# Patient Record
Sex: Female | Born: 1985
Health system: Southern US, Community
[De-identification: ages and names within clinical notes are randomized; demographics above are authoritative.]

## PROBLEM LIST (undated history)

## (undated) ENCOUNTER — Inpatient Hospital Stay (HOSPITAL_COMMUNITY): Payer: Self-pay

## (undated) DIAGNOSIS — M5030 Other cervical disc degeneration, unspecified cervical region: Secondary | ICD-10-CM

## (undated) DIAGNOSIS — K8020 Calculus of gallbladder without cholecystitis without obstruction: Secondary | ICD-10-CM

## (undated) DIAGNOSIS — Z332 Encounter for elective termination of pregnancy: Secondary | ICD-10-CM

## (undated) DIAGNOSIS — D649 Anemia, unspecified: Secondary | ICD-10-CM

## (undated) DIAGNOSIS — Z862 Personal history of diseases of the blood and blood-forming organs and certain disorders involving the immune mechanism: Secondary | ICD-10-CM

## (undated) DIAGNOSIS — F4310 Post-traumatic stress disorder, unspecified: Secondary | ICD-10-CM

## (undated) DIAGNOSIS — C801 Malignant (primary) neoplasm, unspecified: Secondary | ICD-10-CM

## (undated) DIAGNOSIS — F419 Anxiety disorder, unspecified: Secondary | ICD-10-CM

## (undated) DIAGNOSIS — R609 Edema, unspecified: Secondary | ICD-10-CM

## (undated) DIAGNOSIS — F319 Bipolar disorder, unspecified: Secondary | ICD-10-CM

## (undated) DIAGNOSIS — E7800 Pure hypercholesterolemia, unspecified: Secondary | ICD-10-CM

## (undated) DIAGNOSIS — N739 Female pelvic inflammatory disease, unspecified: Secondary | ICD-10-CM

## (undated) DIAGNOSIS — M1990 Unspecified osteoarthritis, unspecified site: Secondary | ICD-10-CM

## (undated) DIAGNOSIS — K219 Gastro-esophageal reflux disease without esophagitis: Secondary | ICD-10-CM

## (undated) DIAGNOSIS — D72829 Elevated white blood cell count, unspecified: Secondary | ICD-10-CM

## (undated) DIAGNOSIS — M419 Scoliosis, unspecified: Secondary | ICD-10-CM

## (undated) DIAGNOSIS — K5900 Constipation, unspecified: Secondary | ICD-10-CM

## (undated) DIAGNOSIS — E669 Obesity, unspecified: Secondary | ICD-10-CM

## (undated) DIAGNOSIS — A630 Anogenital (venereal) warts: Secondary | ICD-10-CM

## (undated) DIAGNOSIS — M5430 Sciatica, unspecified side: Secondary | ICD-10-CM

## (undated) DIAGNOSIS — N189 Chronic kidney disease, unspecified: Secondary | ICD-10-CM

## (undated) DIAGNOSIS — C539 Malignant neoplasm of cervix uteri, unspecified: Secondary | ICD-10-CM

## (undated) DIAGNOSIS — Z87448 Personal history of other diseases of urinary system: Secondary | ICD-10-CM

## (undated) HISTORY — DX: Anxiety disorder, unspecified: F41.9

## (undated) HISTORY — DX: Unspecified osteoarthritis, unspecified site: M19.90

## (undated) HISTORY — PX: ABDOMINAL EXPLORATION SURGERY: SHX538

## (undated) HISTORY — PX: BACK SURGERY: SHX140

## (undated) HISTORY — DX: Scoliosis, unspecified: M41.9

## (undated) HISTORY — DX: Calculus of gallbladder without cholecystitis without obstruction: K80.20

## (undated) HISTORY — DX: Obesity, unspecified: E66.9

---
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
## 2007-10-20 ENCOUNTER — Emergency Department (HOSPITAL_COMMUNITY): Admission: EM | Admit: 2007-10-20 | Discharge: 2007-10-21 | Payer: Self-pay | Admitting: Emergency Medicine

## 2008-09-05 ENCOUNTER — Emergency Department (HOSPITAL_COMMUNITY): Admission: EM | Admit: 2008-09-05 | Discharge: 2008-09-05 | Payer: Self-pay | Admitting: Emergency Medicine

## 2009-05-06 ENCOUNTER — Emergency Department (HOSPITAL_COMMUNITY): Admission: EM | Admit: 2009-05-06 | Discharge: 2009-05-07 | Payer: Self-pay | Admitting: Emergency Medicine

## 2009-05-21 ENCOUNTER — Emergency Department (HOSPITAL_COMMUNITY): Admission: EM | Admit: 2009-05-21 | Discharge: 2009-05-22 | Payer: Self-pay | Admitting: Emergency Medicine

## 2009-10-24 ENCOUNTER — Emergency Department (HOSPITAL_COMMUNITY)
Admission: EM | Admit: 2009-10-24 | Discharge: 2009-10-24 | Payer: Self-pay | Source: Home / Self Care | Admitting: Emergency Medicine

## 2009-11-05 ENCOUNTER — Emergency Department (HOSPITAL_COMMUNITY)
Admission: EM | Admit: 2009-11-05 | Discharge: 2009-11-06 | Payer: Self-pay | Source: Home / Self Care | Admitting: Emergency Medicine

## 2009-11-16 ENCOUNTER — Emergency Department (HOSPITAL_COMMUNITY)
Admission: EM | Admit: 2009-11-16 | Discharge: 2009-11-17 | Payer: Self-pay | Source: Home / Self Care | Admitting: Emergency Medicine

## 2010-02-19 ENCOUNTER — Emergency Department (HOSPITAL_COMMUNITY): Admission: EM | Admit: 2010-02-19 | Discharge: 2010-02-19 | Payer: Self-pay | Admitting: Emergency Medicine

## 2010-04-08 ENCOUNTER — Emergency Department (HOSPITAL_COMMUNITY)
Admission: EM | Admit: 2010-04-08 | Discharge: 2010-04-08 | Payer: Self-pay | Source: Home / Self Care | Admitting: Emergency Medicine

## 2010-06-06 LAB — WET PREP, GENITAL: Trich, Wet Prep: NONE SEEN

## 2010-06-08 LAB — GC/CHLAMYDIA PROBE AMP, GENITAL: GC Probe Amp, Genital: NEGATIVE

## 2010-06-08 LAB — WET PREP, GENITAL: WBC, Wet Prep HPF POC: NONE SEEN

## 2010-06-08 LAB — URINE MICROSCOPIC-ADD ON

## 2010-06-08 LAB — URINALYSIS, ROUTINE W REFLEX MICROSCOPIC
Glucose, UA: NEGATIVE mg/dL
Nitrite: POSITIVE — AB
Specific Gravity, Urine: 1.036 — ABNORMAL HIGH (ref 1.005–1.030)

## 2010-06-08 LAB — URINE CULTURE
Colony Count: >=100000
Culture  Setup Time: 201108250857

## 2010-06-08 LAB — POCT PREGNANCY, URINE: Preg Test, Ur: NEGATIVE

## 2010-06-09 LAB — POCT PREGNANCY, URINE: Preg Test, Ur: NEGATIVE

## 2010-06-09 LAB — WET PREP, GENITAL
Trich, Wet Prep: NONE SEEN
Yeast Wet Prep HPF POC: NONE SEEN

## 2010-06-09 LAB — HEMOCCULT GUIAC POC 1CARD (OFFICE): Fecal Occult Bld: POSITIVE

## 2010-07-03 ENCOUNTER — Emergency Department (HOSPITAL_COMMUNITY): Payer: Medicaid Other

## 2010-07-03 ENCOUNTER — Emergency Department (HOSPITAL_COMMUNITY)
Admission: EM | Admit: 2010-07-03 | Discharge: 2010-07-03 | Disposition: A | Discharge status: Home / Self Care | Payer: Medicaid Other | Attending: Emergency Medicine | Admitting: Emergency Medicine

## 2010-07-03 DIAGNOSIS — S91309A Unspecified open wound, unspecified foot, initial encounter: Secondary | ICD-10-CM | POA: Insufficient documentation

## 2010-07-03 DIAGNOSIS — W269XXA Contact with unspecified sharp object(s), initial encounter: Secondary | ICD-10-CM | POA: Insufficient documentation

## 2010-07-03 DIAGNOSIS — Z23 Encounter for immunization: Secondary | ICD-10-CM | POA: Insufficient documentation

## 2010-07-03 LAB — DIFFERENTIAL
Basophils Relative: 0 % (ref 0–1)
Eosinophils Absolute: 0.2 10*3/uL (ref 0.0–0.7)
Eosinophils Relative: 2 % (ref 0–5)
Lymphs Abs: 2.7 10*3/uL (ref 0.7–4.0)
Monocytes Absolute: 0.5 10*3/uL (ref 0.1–1.0)
Monocytes Relative: 7 % (ref 3–12)
Neutrophils Relative %: 59 % (ref 43–77)

## 2010-07-03 LAB — CBC
HCT: 37.3 % (ref 36.0–46.0)
MCHC: 33.3 g/dL (ref 30.0–36.0)
MCV: 84.5 fL (ref 78.0–100.0)
RBC: 4.42 MIL/uL (ref 3.87–5.11)

## 2010-07-03 LAB — URINALYSIS, ROUTINE W REFLEX MICROSCOPIC
Hgb urine dipstick: NEGATIVE
Protein, ur: NEGATIVE mg/dL

## 2010-07-03 LAB — POCT PREGNANCY, URINE: Preg Test, Ur: NEGATIVE

## 2010-07-03 LAB — BASIC METABOLIC PANEL
CO2: 26 mEq/L (ref 19–32)
Chloride: 107 mEq/L (ref 96–112)
Creatinine, Ser: 0.79 mg/dL (ref 0.4–1.2)
GFR calc Af Amer: >60 mL/min (ref >60)
Potassium: 4.3 mEq/L (ref 3.5–5.1)

## 2010-07-07 ENCOUNTER — Emergency Department (HOSPITAL_COMMUNITY)
Admission: EM | Admit: 2010-07-07 | Discharge: 2010-07-07 | Disposition: A | Discharge status: Home / Self Care | Payer: Medicaid Other | Attending: Emergency Medicine | Admitting: Emergency Medicine

## 2010-07-07 DIAGNOSIS — R109 Unspecified abdominal pain: Secondary | ICD-10-CM | POA: Insufficient documentation

## 2010-07-07 DIAGNOSIS — N898 Other specified noninflammatory disorders of vagina: Secondary | ICD-10-CM | POA: Insufficient documentation

## 2010-07-07 DIAGNOSIS — K089 Disorder of teeth and supporting structures, unspecified: Secondary | ICD-10-CM | POA: Insufficient documentation

## 2010-07-07 DIAGNOSIS — A64 Unspecified sexually transmitted disease: Secondary | ICD-10-CM | POA: Insufficient documentation

## 2010-07-07 LAB — WET PREP, GENITAL
WBC, Wet Prep HPF POC: NONE SEEN
Yeast Wet Prep HPF POC: NONE SEEN

## 2010-07-07 LAB — URINALYSIS, ROUTINE W REFLEX MICROSCOPIC
Bilirubin Urine: NEGATIVE
Glucose, UA: NEGATIVE mg/dL
Hgb urine dipstick: NEGATIVE
Ketones, ur: NEGATIVE mg/dL
Protein, ur: NEGATIVE mg/dL
pH: 6 (ref 5.0–8.0)

## 2010-07-07 LAB — URINE MICROSCOPIC-ADD ON

## 2010-07-08 LAB — URINE CULTURE: Colony Count: 50000

## 2010-08-11 NOTE — Discharge Summary (Signed)
NAMELINDE, WILENSKY                          ACCOUNT NO.:  [id number]   MEDICAL RECORD NO.:  [id number]                   PATIENT TYPE:  INP   LOCATION:  0102                                 FACILITY:  [facility]   PHYSICIAN:  Cindie Crumbly, M.D.               DATE OF BIRTH:  November 09, 1985   DATE OF ADMISSION:  04/17/2002  DATE OF DISCHARGE:                                 DISCHARGE SUMMARY   REASON FOR ADMISSION:  This 25 year old white female was admitted for  suicidal ideation with a plan to kill herself via drug overdose.  For  further history of present illness, please see the patient's psychiatry  admission assessment.   PHYSICAL EXAMINATION:  At the time of admission was significant for obesity.  She also had a history of gastroesophageal reflux disease for which she was  taking Nexium and a history of asthma for which she used an albuterol  inhaler on a p.r.n. basis.  She also had a history of anemia in the distant  past.   LABORATORY EXAMINATION:  The patient underwent a laboratory workup to rule  out any  other medical problems contributing to her symptomatology.  A TSH  was 0.270, free T4 was 1.32.  GGT was within normal limits.  A UA showed  trace leukocyte esterase.  She continued to complain of further symptoms and  a second UA is pending at the time of discharge.  She was treated  prophylactically with Septra DS 1 p.o. b.i.d. for a total of 7 days and  reports that her symptoms of dysuria are now resolving.  A urine probe for  gonorrhea and chlamydia were indeterminate and probably secondary to the  patient's urinary tract infection.  An RPR was nonreactive.  Further  laboratory work-up was performed at [facility] prior  to the patient being transferred to this facility.  The patient received no  x-rays, no special procedures, no additional consultations.  She sustained  no complications during the course of this hospitalization.   HOSPITAL  COURSE:  On admission, the patient was psychomotor agitated,  histrionic.  Affect and mood were depressed, irritable and angry.  She  admitted to suicidal ideation.  She showed poor impulse control and was  begun on a trial of Lexapro.  She tolerated this medication well without  side effects and at the time of discharge she denies any homicidal or  suicidal ideation.  Her affect and mood have improved.  Her concentration is  increased.  She is actively participating in all aspects of the therapeutic  treatment program and consequently is felt to have reached her maximum  benefits of hospitalization and is ready for discharge to a less restricted  alternative setting.  She no longer appears to be a danger to herself or  others.   CONDITION ON DISCHARGE:  Improved.   DISCHARGE DIAGNOSES:   AXIS I:  1.  Major depression, single episode, severe, without psychosis.  2. Rule out substance-induced mood disorder.  3. Polysubstance abuse.  4. Rule out polysubstance dependence.   AXIS II:  1. Histrionic traits.  2. Rule out personality disorder not otherwise specified.    AXIS III:  1. Gastroesophageal reflux disease.  2. History of asthma.  3. History of anemia.  4. Cystitis, resolving.   AXIS IV:  Current psychosocial stressors are severe.   AXIS V:  Code 20 on admission, code 30 on discharge.   FURTHER EVALUATION AND TREATMENT RECOMMENDATIONS:  1. The patient is discharged to home.  2. She is discharged on an unrestricted level of activity and a regular     diet.  3. She will follow up with her primary care physician for all further     aspects of her medical care and with [facility]     for all further aspects of her psychiatric care and consequently I will     sign off on the case at this time.   DISCHARGE MEDICATIONS:  1. Lexapro 10 mg p.o. daily.  2. Septra DS 1 p.o. b.i.d. for a total of 7 days.  3. Nexium and albuterol inhaler she will  continue to take, as prescribed by     her primary care physician.                                                 Cindie Crumbly, M.D.    TS/MEDQ  D:  04/24/2002  T:  04/24/2002  Job:  [id number]
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
## 2010-09-03 ENCOUNTER — Emergency Department (HOSPITAL_COMMUNITY)
Admission: EM | Admit: 2010-09-03 | Discharge: 2010-09-03 | Disposition: A | Discharge status: Home / Self Care | Payer: Medicaid Other | Attending: Emergency Medicine | Admitting: Emergency Medicine

## 2010-09-03 DIAGNOSIS — K219 Gastro-esophageal reflux disease without esophagitis: Secondary | ICD-10-CM | POA: Insufficient documentation

## 2010-09-03 DIAGNOSIS — F329 Major depressive disorder, single episode, unspecified: Secondary | ICD-10-CM | POA: Insufficient documentation

## 2010-09-03 DIAGNOSIS — R609 Edema, unspecified: Secondary | ICD-10-CM | POA: Insufficient documentation

## 2010-09-03 DIAGNOSIS — M545 Low back pain, unspecified: Secondary | ICD-10-CM | POA: Insufficient documentation

## 2010-09-03 DIAGNOSIS — M5430 Sciatica, unspecified side: Secondary | ICD-10-CM | POA: Insufficient documentation

## 2010-09-03 DIAGNOSIS — J45909 Unspecified asthma, uncomplicated: Secondary | ICD-10-CM | POA: Insufficient documentation

## 2010-09-03 DIAGNOSIS — G8929 Other chronic pain: Secondary | ICD-10-CM | POA: Insufficient documentation

## 2010-09-03 DIAGNOSIS — M79609 Pain in unspecified limb: Secondary | ICD-10-CM | POA: Insufficient documentation

## 2010-09-03 DIAGNOSIS — R339 Retention of urine, unspecified: Secondary | ICD-10-CM | POA: Insufficient documentation

## 2010-09-03 DIAGNOSIS — Z79899 Other long term (current) drug therapy: Secondary | ICD-10-CM | POA: Insufficient documentation

## 2010-09-03 DIAGNOSIS — F3289 Other specified depressive episodes: Secondary | ICD-10-CM | POA: Insufficient documentation

## 2010-09-04 ENCOUNTER — Encounter: Payer: Self-pay | Admitting: Family Medicine

## 2010-10-22 ENCOUNTER — Emergency Department (HOSPITAL_COMMUNITY)
Admission: EM | Admit: 2010-10-22 | Discharge: 2010-10-22 | Disposition: A | Discharge status: Home / Self Care | Payer: Medicaid Other | Attending: Emergency Medicine | Admitting: Emergency Medicine

## 2010-10-22 DIAGNOSIS — H9209 Otalgia, unspecified ear: Secondary | ICD-10-CM | POA: Insufficient documentation

## 2010-10-22 DIAGNOSIS — K219 Gastro-esophageal reflux disease without esophagitis: Secondary | ICD-10-CM | POA: Insufficient documentation

## 2010-10-22 DIAGNOSIS — R51 Headache: Secondary | ICD-10-CM | POA: Insufficient documentation

## 2010-10-22 DIAGNOSIS — J45909 Unspecified asthma, uncomplicated: Secondary | ICD-10-CM | POA: Insufficient documentation

## 2010-10-22 DIAGNOSIS — H60399 Other infective otitis externa, unspecified ear: Secondary | ICD-10-CM | POA: Insufficient documentation

## 2010-10-22 DIAGNOSIS — N3010 Interstitial cystitis (chronic) without hematuria: Secondary | ICD-10-CM | POA: Insufficient documentation

## 2010-12-22 LAB — DIFFERENTIAL
Basophils Relative: 0
Eosinophils Absolute: 0.2
Lymphs Abs: 3.2
Monocytes Relative: 5
Neutro Abs: 13 — ABNORMAL HIGH
Neutrophils Relative %: 75

## 2010-12-22 LAB — CBC
MCHC: 33
MCV: 79.8
Platelets: 287
RBC: 3.98
WBC: 17.4 — ABNORMAL HIGH

## 2010-12-22 LAB — POCT I-STAT, CHEM 8
Creatinine, Ser: 0.8
Glucose, Bld: 84
HCT: 33 — ABNORMAL LOW
Hemoglobin: 11.2 — ABNORMAL LOW
TCO2: 19

## 2010-12-22 LAB — BUN: BUN: 8

## 2010-12-22 LAB — CREATININE, SERUM
Creatinine, Ser: 0.71
GFR calc Af Amer: >60

## 2011-03-09 ENCOUNTER — Emergency Department (HOSPITAL_COMMUNITY)
Admission: EM | Admit: 2011-03-09 | Discharge: 2011-03-10 | Disposition: A | Discharge status: Home / Self Care | Payer: Medicaid Other | Attending: Emergency Medicine | Admitting: Emergency Medicine

## 2011-03-09 ENCOUNTER — Encounter: Payer: Self-pay | Admitting: *Deleted

## 2011-03-09 ENCOUNTER — Emergency Department (HOSPITAL_COMMUNITY): Payer: Medicaid Other

## 2011-03-09 DIAGNOSIS — R1011 Right upper quadrant pain: Secondary | ICD-10-CM | POA: Insufficient documentation

## 2011-03-09 DIAGNOSIS — R3915 Urgency of urination: Secondary | ICD-10-CM | POA: Insufficient documentation

## 2011-03-09 DIAGNOSIS — M549 Dorsalgia, unspecified: Secondary | ICD-10-CM | POA: Insufficient documentation

## 2011-03-09 DIAGNOSIS — R110 Nausea: Secondary | ICD-10-CM | POA: Insufficient documentation

## 2011-03-09 DIAGNOSIS — R12 Heartburn: Secondary | ICD-10-CM | POA: Insufficient documentation

## 2011-03-09 DIAGNOSIS — K5900 Constipation, unspecified: Secondary | ICD-10-CM | POA: Insufficient documentation

## 2011-03-09 DIAGNOSIS — K8020 Calculus of gallbladder without cholecystitis without obstruction: Secondary | ICD-10-CM

## 2011-03-09 DIAGNOSIS — R300 Dysuria: Secondary | ICD-10-CM | POA: Insufficient documentation

## 2011-03-09 DIAGNOSIS — R319 Hematuria, unspecified: Secondary | ICD-10-CM | POA: Insufficient documentation

## 2011-03-09 LAB — CBC
MCH: 28.3 pg (ref 26.0–34.0)
MCHC: 33.5 g/dL (ref 30.0–36.0)
MCV: 84.3 fL (ref 78.0–100.0)
Platelets: 246 10*3/uL (ref 150–400)
RBC: 3.82 MIL/uL — ABNORMAL LOW (ref 3.87–5.11)
RDW: 15.3 % (ref 11.5–15.5)

## 2011-03-09 LAB — POCT PREGNANCY, URINE: Preg Test, Ur: NEGATIVE

## 2011-03-09 LAB — DIFFERENTIAL
Basophils Relative: 0 % (ref 0–1)
Eosinophils Absolute: 0.3 10*3/uL (ref 0.0–0.7)
Eosinophils Relative: 3 % (ref 0–5)
Lymphs Abs: 4.8 10*3/uL — ABNORMAL HIGH (ref 0.7–4.0)

## 2011-03-09 LAB — URINE MICROSCOPIC-ADD ON

## 2011-03-09 LAB — URINALYSIS, ROUTINE W REFLEX MICROSCOPIC
Bilirubin Urine: NEGATIVE
Hgb urine dipstick: NEGATIVE
Ketones, ur: NEGATIVE mg/dL
Nitrite: NEGATIVE
Protein, ur: NEGATIVE mg/dL
Urobilinogen, UA: 0.2 mg/dL (ref 0.0–1.0)

## 2011-03-09 LAB — COMPREHENSIVE METABOLIC PANEL
ALT: 20 U/L (ref 0–35)
Albumin: 3.3 g/dL — ABNORMAL LOW (ref 3.5–5.2)
Calcium: 8.5 mg/dL (ref 8.4–10.5)
GFR calc Af Amer: >90 mL/min (ref >90)
Glucose, Bld: 82 mg/dL (ref 70–99)
Sodium: 138 mEq/L (ref 135–145)
Total Protein: 6.6 g/dL (ref 6.0–8.3)

## 2011-03-09 LAB — LIPASE, BLOOD: Lipase: 42 U/L (ref 11–59)

## 2011-03-09 MED ORDER — ONDANSETRON HCL 4 MG PO TABS: 4.0000 mg | ORAL_TABLET | Freq: Four times a day (QID) | ORAL | Status: AC

## 2011-03-09 MED ORDER — ONDANSETRON HCL 4 MG/2ML IJ SOLN
4.0000 mg | Freq: Once | INTRAMUSCULAR | Status: AC
  Administered 2011-03-09: 4 mg via INTRAVENOUS
  Filled 2011-03-09: qty 2

## 2011-03-09 MED ORDER — SODIUM CHLORIDE 0.9 % IV SOLN
INTRAVENOUS | Status: DC
  Administered 2011-03-09: 1000 mL via INTRAVENOUS

## 2011-03-09 MED ORDER — HYDROCODONE-ACETAMINOPHEN 5-325 MG PO TABS: 2.0000 | ORAL_TABLET | ORAL | Status: AC | PRN

## 2011-03-09 MED ORDER — DICYCLOMINE HCL 20 MG PO TABS
10.0000 mg | ORAL_TABLET | Freq: Once | ORAL | Status: AC
  Administered 2011-03-09: 10 mg via ORAL
  Filled 2011-03-09: qty 1

## 2011-03-09 MED ORDER — HYDROMORPHONE HCL PF 1 MG/ML IJ SOLN
1.0000 mg | Freq: Once | INTRAMUSCULAR | Status: AC
  Administered 2011-03-09: 1 mg via INTRAVENOUS
  Filled 2011-03-09: qty 1

## 2011-03-09 NOTE — ED Provider Notes (Signed)
History     CSN: [id number] Arrival date & time: 03/09/2011  7:17 PM   First MD Initiated Contact with Patient 03/09/11 2001      Chief Complaint  Patient presents with  . Abdominal Pain    RUQ    (Consider location/radiation/quality/duration/timing/severity/associated sxs/prior treatment) Patient is a 25 y.o. female presenting with abdominal pain. The history is provided by the patient. No language interpreter was used.  Abdominal Pain The primary symptoms of the illness include abdominal pain, nausea and dysuria. The primary symptoms of the illness do not include fever, fatigue, shortness of breath, vomiting, diarrhea, vaginal discharge or vaginal bleeding. The current episode started 2 days ago. The problem has been gradually worsening.  The dysuria is associated with hematuria and urgency. The dysuria is not associated with frequency.  Additional symptoms associated with the illness include heartburn, constipation, urgency, hematuria and back pain. Symptoms associated with the illness do not include chills, anorexia, diaphoresis or frequency. Significant associated medical issues include PUD, GERD, gallstones and liver disease. Significant associated medical issues do not include inflammatory bowel disease, diverticulitis, HIV or cardiac disease.  RUQ pain with nausea and vomiting x 24 hours.  Multiple complaints including dysuria, severe back pain across the middle due to her newly diagnosed scoliosis.  She was started on flagyl 2 days ago [facility].    Epigastric pain from her chronic GERD.  Missed her last dose of nexium.  Increased flatus and burping.  Crying in pain.  States she has been to a pain clinic in the past.  States she has no money and had to borrow money to feed her kids.  Due for her next depo shot on Dec 27.  Has not had a period since her last child was born 19 months ago.    Past Medical History  Diagnosis Date  . Cholecystolithiasis      Past Surgical History  Procedure Date  . Appendectomy     History reviewed. No pertinent family history.  History  Substance Use Topics  . Smoking status: Not on file  . Smokeless tobacco: Not on file  . Alcohol Use: No    OB History    Grav Para Term Preterm Abortions TAB SAB Ect Mult Living                  Review of Systems  Constitutional: Negative for fever, chills, diaphoresis and fatigue.  Respiratory: Negative for shortness of breath.   Gastrointestinal: Positive for heartburn, nausea, abdominal pain and constipation. Negative for vomiting, diarrhea and anorexia.  Genitourinary: Positive for dysuria, urgency and hematuria. Negative for frequency, vaginal bleeding and vaginal discharge.  Musculoskeletal: Positive for back pain.  All other systems reviewed and are negative.    Allergies  Review of patient's allergies indicates no known allergies.  Home Medications   Current Outpatient Rx  Name Route Sig Dispense Refill  . ESOMEPRAZOLE MAGNESIUM 40 MG PO CPDR Oral Take 80 mg by mouth daily before breakfast.      . METRONIDAZOLE 500 MG PO TABS Oral Take 500 mg by mouth 2 (two) times daily.      Marland Kitchen HYDROCODONE-ACETAMINOPHEN 5-325 MG PO TABS Oral Take 2 tablets by mouth every 4 (four) hours as needed for pain. 15 tablet 0  . ONDANSETRON HCL 4 MG PO TABS Oral Take 1 tablet (4 mg total) by mouth every 6 (six) hours. 12 tablet 0  . ONDANSETRON HCL 4 MG PO TABS Oral Take  1 tablet (4 mg total) by mouth every 6 (six) hours. 12 tablet 0    BP 123/85  Pulse 92  Temp(Src) 98.3 F (36.8 C) (Oral)  Resp 18  SpO2 100%  Physical Exam  Nursing note and vitals reviewed. Constitutional: She is oriented to person, place, and time. She appears well-developed and well-nourished.  Eyes: Pupils are equal, round, and reactive to light.  Neck: Normal range of motion.  Cardiovascular: Normal rate and normal heart sounds.  Exam reveals no gallop.   No murmur  heard. Pulmonary/Chest: Effort normal.  Abdominal: Soft. She exhibits no distension and no mass. There is tenderness. There is no rebound and no guarding.  Musculoskeletal: Normal range of motion.  Neurological: She is alert and oriented to person, place, and time.  Skin: Skin is warm and dry. No rash noted. No erythema.  Psychiatric: She has a normal mood and affect.    ED Course  Procedures (including critical care time)  Labs Reviewed  URINALYSIS, ROUTINE W REFLEX MICROSCOPIC - Abnormal; Notable for the following:    Leukocytes, UA TRACE (*)    All other components within normal limits  CBC - Abnormal; Notable for the following:    WBC 11.2 (*)    RBC 3.82 (*)    Hemoglobin 10.8 (*)    HCT 32.2 (*)    All other components within normal limits  DIFFERENTIAL - Abnormal; Notable for the following:    Lymphs Abs 4.8 (*)    All other components within normal limits  COMPREHENSIVE METABOLIC PANEL - Abnormal; Notable for the following:    Albumin 3.3 (*)    Total Bilirubin 0.1 (*)    All other components within normal limits  LIPASE, BLOOD  POCT PREGNANCY, URINE  URINE MICROSCOPIC-ADD ON  LAB REPORT - SCANNED   US Abdomen Complete  03/09/2011  *RADIOLOGY REPORT*  Clinical Data:  Were right upper quadrant pain, mid epigastric and back pain.  History of gallstones.  COMPLETE ABDOMINAL ULTRASOUND  Comparison:  10/20/2007 CT  Findings:  Gallbladder:  Cholelithiasis.  Wall echo shadow sign.  No wall thickening or pericholecystic fluid.  Negative sonographic Murphy's sign.  Common bile duct:  Dilated up to 8 mm.  The distal duct is obscured.  There is a questionable sludge or debris within the distal duct however no shadowing stones identified.  Liver:  No focal lesion identified.  Within normal limits in parenchymal echogenicity.  IVC:  Appears normal.  Pancreas:  Pancreatic duct measures up to 2 mm.  No focal abnormality identified.  Spleen:  Normal sonographic appearance measuring 10 cm.   Right Kidney:  Normal sonographic appearance measuring 11.1 cm.  No hydronephrosis.  Left Kidney:  Normal sonographic appearance, measuring 11.3 cm. Hydronephrosis.  Abdominal aorta:  Normal sonographic appearance without aneurysmal dilatation, measuring 2.1 cm.  IMPRESSION: Cholelithiasis without sonographic evidence for cholecystitis.  There is dilatation of the CBD up to 8 mm, which allowing for differences in technique is larger than the 2009 CT. While no shadowing stones identified, there is questionable sludge/debris within the distal duct. LFT correlation and consideration for ERCP recommended.  Original Report Authenticated By: Waneta Martins, M.D.     1. Abdominal pain   2. Cholecystolithiasis       MDM  Multiple complaints including RUQ pain, chronic back pain, BV with trmt 2 days ago on flagyl, indegestion and flatus.  Very emotional crying in pain.  U/s shows cholelithiasis  Without cholecystiis.  No liver enzyme elevation.  Better after pain med.  Wants to go home and follow up with [facility].  Abn labs as follows: Labs Reviewed  URINALYSIS, ROUTINE W REFLEX MICROSCOPIC - Abnormal; Notable for the following:    Leukocytes, UA TRACE (*)    All other components within normal limits  CBC - Abnormal; Notable for the following:    WBC 11.2 (*)    RBC 3.82 (*)    Hemoglobin 10.8 (*)    HCT 32.2 (*)    All other components within normal limits  DIFFERENTIAL - Abnormal; Notable for the following:    Lymphs Abs 4.8 (*)    All other components within normal limits  COMPREHENSIVE METABOLIC PANEL - Abnormal; Notable for the following:    Albumin 3.3 (*)    Total Bilirubin 0.1 (*)    All other components within normal limits  LIPASE, BLOOD  POCT PREGNANCY, URINE  URINE MICROSCOPIC-ADD ON  LAB REPORT - SCANNED          Jethro Bastos, NP 03/10/11 1705

## 2011-03-09 NOTE — ED Notes (Signed)
Pt complaining of what feels like a "muscle spasm" located centrally in her back along with a burning sensation in her stomach.

## 2011-03-09 NOTE — ED Notes (Signed)
Per EMS:  Pt comes from home where she began experiencing RUQ abdominal pain this evening.  Pt denies V/D and is nauseated.  Pt confirms hx of gall stones that have not been treated.  Of note, pt was treated yesterday for an infection involving her cervix (unclear which infection).

## 2011-03-11 NOTE — ED Provider Notes (Signed)
Medical screening examination/treatment/procedure(s) were performed by non-physician practitioner and as supervising physician I was immediately available for consultation/collaboration.  Olivia Mackie, MD 03/11/11 [phone]

## 2011-04-05 ENCOUNTER — Encounter (HOSPITAL_COMMUNITY): Payer: Self-pay | Admitting: *Deleted

## 2011-04-05 ENCOUNTER — Emergency Department (HOSPITAL_COMMUNITY)
Admission: EM | Admit: 2011-04-05 | Discharge: 2011-04-06 | Disposition: A | Discharge status: Home / Self Care | Payer: Medicaid Other | Attending: Emergency Medicine | Admitting: Emergency Medicine

## 2011-04-05 ENCOUNTER — Encounter: Payer: Self-pay | Admitting: Internal Medicine

## 2011-04-05 DIAGNOSIS — G8929 Other chronic pain: Secondary | ICD-10-CM

## 2011-04-05 DIAGNOSIS — M545 Low back pain, unspecified: Secondary | ICD-10-CM | POA: Insufficient documentation

## 2011-04-05 NOTE — ED Notes (Signed)
The pt has had lower back pain for the past 2 days.  She goes to a pain clinic but lost her certification.

## 2011-04-05 NOTE — ED Provider Notes (Signed)
History     CSN: [id number]  Arrival date & time 04/05/11  2204   First MD Initiated Contact with Patient 04/05/11 2349      Chief Complaint  Patient presents with  . Back Pain    (Consider location/radiation/quality/duration/timing/severity/associated sxs/prior treatment) HPI The pt has had lower back pain for the past 2 days. She goes to a pain clinic but lost her certification.  Past Medical History  Diagnosis Date  . Cholecystolithiasis     Past Surgical History  Procedure Date  . Appendectomy     History reviewed. No pertinent family history.  History  Substance Use Topics  . Smoking status: Not on file  . Smokeless tobacco: Not on file  . Alcohol Use: No    OB History    Grav Para Term Preterm Abortions TAB SAB Ect Mult Living                  Review of Systems  Allergies  Review of patient's allergies indicates no known allergies.  Home Medications   Current Outpatient Rx  Name Route Sig Dispense Refill  . GOODYS BODY PAIN PO Oral Take 1 packet by mouth 2 (two) times daily as needed. For pain    . FLUCONAZOLE 150 MG PO TABS Oral Take 150 mg by mouth once.    . OMEPRAZOLE 20 MG PO CPDR Oral Take 20 mg by mouth daily.    . CYCLOBENZAPRINE HCL 10 MG PO TABS Oral Take 1 tablet (10 mg total) by mouth 2 (two) times daily as needed for muscle spasms. 20 tablet 0  . TRAMADOL HCL 50 MG PO TABS Oral Take 1 tablet (50 mg total) by mouth every 6 (six) hours as needed for pain. 15 tablet 0    BP 110/73  Pulse 100  Temp(Src) 98.6 F (37 C) (Oral)  Resp 20  SpO2 100%  Physical Exam  Nursing note and vitals reviewed. Constitutional: She is oriented to person, place, and time. She appears well-developed and well-nourished. No distress.  HENT:  Head: Normocephalic and atraumatic.  Eyes: Pupils are equal, round, and reactive to light.  Neck: Normal range of motion.  Cardiovascular: Normal rate and intact distal pulses.   Pulmonary/Chest: No respiratory  distress.  Abdominal: Normal appearance. She exhibits no distension.  Musculoskeletal: Normal range of motion.       Thoracic back: She exhibits pain.       Lumbar back: She exhibits tenderness and spasm.  Neurological: She is alert and oriented to person, place, and time. No cranial nerve deficit. GCS eye subscore is 4. GCS verbal subscore is 5. GCS motor subscore is 6.  Skin: Skin is warm and dry. No rash noted.  Psychiatric: She has a normal mood and affect. Her behavior is normal.    ED Course  Procedures (including critical care time)  Labs Reviewed - No data to display No results found.   1. Chronic back pain              Nelia Shi, MD 04/06/11 0020

## 2011-04-06 ENCOUNTER — Encounter: Payer: Self-pay | Admitting: Internal Medicine

## 2011-04-06 MED ORDER — TRAMADOL HCL 50 MG PO TABS: 50.0000 mg | ORAL_TABLET | Freq: Four times a day (QID) | ORAL | Status: AC | PRN

## 2011-04-06 MED ORDER — OXYCODONE-ACETAMINOPHEN 5-325 MG PO TABS
ORAL_TABLET | ORAL | Status: AC
  Filled 2011-04-06: qty 2

## 2011-04-06 MED ORDER — OXYCODONE-ACETAMINOPHEN 5-325 MG PO TABS
2.0000 | ORAL_TABLET | Freq: Once | ORAL | Status: AC
  Administered 2011-04-06: 2 via ORAL

## 2011-04-06 MED ORDER — CYCLOBENZAPRINE HCL 10 MG PO TABS: 10.0000 mg | ORAL_TABLET | Freq: Two times a day (BID) | ORAL | Status: AC | PRN

## 2011-04-06 MED ORDER — HYDROMORPHONE HCL PF 2 MG/ML IJ SOLN
2.0000 mg | Freq: Once | INTRAMUSCULAR | Status: AC
  Administered 2011-04-06: 2 mg via INTRAMUSCULAR
  Filled 2011-04-06: qty 1

## 2011-04-06 NOTE — ED Notes (Signed)
Discharge inst and prescriptions given   Voiced understanding. 

## 2011-04-06 NOTE — ED Notes (Signed)
Patient continues to c/o pain to her back 10/10  Will notify MD

## 2011-04-07 ENCOUNTER — Emergency Department (HOSPITAL_COMMUNITY)
Admission: EM | Admit: 2011-04-07 | Discharge: 2011-04-08 | Disposition: A | Discharge status: Home / Self Care | Payer: Medicaid Other | Attending: Emergency Medicine | Admitting: Emergency Medicine

## 2011-04-07 ENCOUNTER — Encounter (HOSPITAL_COMMUNITY): Payer: Self-pay | Admitting: *Deleted

## 2011-04-07 DIAGNOSIS — R109 Unspecified abdominal pain: Secondary | ICD-10-CM | POA: Insufficient documentation

## 2011-04-07 DIAGNOSIS — R10816 Epigastric abdominal tenderness: Secondary | ICD-10-CM | POA: Insufficient documentation

## 2011-04-07 DIAGNOSIS — K649 Unspecified hemorrhoids: Secondary | ICD-10-CM

## 2011-04-07 DIAGNOSIS — K8020 Calculus of gallbladder without cholecystitis without obstruction: Secondary | ICD-10-CM | POA: Insufficient documentation

## 2011-04-07 DIAGNOSIS — K644 Residual hemorrhoidal skin tags: Secondary | ICD-10-CM | POA: Insufficient documentation

## 2011-04-07 LAB — PREGNANCY, URINE: Preg Test, Ur: NEGATIVE

## 2011-04-07 LAB — URINALYSIS, ROUTINE W REFLEX MICROSCOPIC
Glucose, UA: NEGATIVE mg/dL
Hgb urine dipstick: NEGATIVE
Protein, ur: NEGATIVE mg/dL
pH: 6 (ref 5.0–8.0)

## 2011-04-07 LAB — CBC
Platelets: 232 10*3/uL (ref 150–400)
RDW: 15 % (ref 11.5–15.5)
WBC: 10.5 10*3/uL (ref 4.0–10.5)

## 2011-04-07 LAB — COMPREHENSIVE METABOLIC PANEL
AST: 20 U/L (ref 0–37)
Albumin: 3.8 g/dL (ref 3.5–5.2)
Alkaline Phosphatase: 66 U/L (ref 39–117)
Chloride: 104 mEq/L (ref 96–112)
Creatinine, Ser: 0.86 mg/dL (ref 0.50–1.10)
Potassium: 3.5 mEq/L (ref 3.5–5.1)
Total Bilirubin: 0.2 mg/dL — ABNORMAL LOW (ref 0.3–1.2)
Total Protein: 7.8 g/dL (ref 6.0–8.3)

## 2011-04-07 MED ORDER — HYDROMORPHONE HCL PF 1 MG/ML IJ SOLN
1.0000 mg | Freq: Once | INTRAMUSCULAR | Status: AC
Start: 2011-04-07 — End: 2011-04-07
  Administered 2011-04-07: 1 mg via INTRAVENOUS
  Filled 2011-04-07: qty 1

## 2011-04-07 MED ORDER — SODIUM CHLORIDE 0.9 % IV BOLUS (SEPSIS)
1000.0000 mL | Freq: Once | INTRAVENOUS | Status: AC
  Administered 2011-04-07: 1000 mL via INTRAVENOUS

## 2011-04-07 MED ORDER — ONDANSETRON HCL 4 MG/2ML IJ SOLN
4.0000 mg | Freq: Once | INTRAMUSCULAR | Status: AC
  Administered 2011-04-07: 4 mg via INTRAVENOUS
  Filled 2011-04-07: qty 2

## 2011-04-07 MED ORDER — GI COCKTAIL ~~LOC~~
30.0000 mL | Freq: Once | ORAL | Status: AC
  Administered 2011-04-07: 30 mL via ORAL
  Filled 2011-04-07: qty 30

## 2011-04-07 NOTE — ED Notes (Signed)
Pt presents tonight for pain in her RUQ that is described as same as what she experienced x 2 weeks ago that was attributed to cholelithiasis.  Pt was instructed to follow up with her MD which referred her to a GI specialist.  The appointment with said GI specialist is not until the 22nd of this month and the pt, appearing tearful and restless, states that she simply cannot wait until then for relief from said cholelithiasis.  Pt also states that upon palpation of her rectum earlier when she was constipated, she noted blood upon her hand.

## 2011-04-08 DIAGNOSIS — G43909 Migraine, unspecified, not intractable, without status migrainosus: Secondary | ICD-10-CM | POA: Insufficient documentation

## 2011-04-08 DIAGNOSIS — K8020 Calculus of gallbladder without cholecystitis without obstruction: Secondary | ICD-10-CM | POA: Insufficient documentation

## 2011-04-08 DIAGNOSIS — J45909 Unspecified asthma, uncomplicated: Secondary | ICD-10-CM | POA: Insufficient documentation

## 2011-04-08 MED ORDER — HYDROCODONE-ACETAMINOPHEN 5-325 MG PO TABS: 1.0000 | ORAL_TABLET | ORAL | Status: AC | PRN

## 2011-04-08 MED ORDER — HYDROMORPHONE HCL PF 1 MG/ML IJ SOLN
1.0000 mg | Freq: Once | INTRAMUSCULAR | Status: AC
  Administered 2011-04-08: 1 mg via INTRAVENOUS
  Filled 2011-04-08: qty 1

## 2011-04-08 MED ORDER — ONDANSETRON HCL 4 MG PO TABS: 4.0000 mg | ORAL_TABLET | Freq: Four times a day (QID) | ORAL | Status: AC | PRN

## 2011-04-08 NOTE — ED Provider Notes (Addendum)
12:05 AM Pt with known gallstones, who presents tonight with severe epigastric pain that goes to her back.  Exam shows epigastric tenderness, no mass or rigidity.   Results for orders placed during the hospital encounter of 04/07/11  CBC      Component Value Range   WBC 10.5  4.0 - 10.5 (K/uL)   RBC 4.22  3.87 - 5.11 (MIL/uL)   Hemoglobin 11.9 (*) 12.0 - 15.0 (g/dL)   HCT 16.1 (*) 09.6 - 46.0 (%)   MCV 83.9  78.0 - 100.0 (fL)   MCH 28.2  26.0 - 34.0 (pg)   MCHC 33.6  30.0 - 36.0 (g/dL)   RDW 04.5  40.9 - 81.1 (%)   Platelets 232  150 - 400 (K/uL)  COMPREHENSIVE METABOLIC PANEL      Component Value Range   Sodium 138  135 - 145 (mEq/L)   Potassium 3.5  3.5 - 5.1 (mEq/L)   Chloride 104  96 - 112 (mEq/L)   CO2 22  19 - 32 (mEq/L)   Glucose, Bld 80  70 - 99 (mg/dL)   BUN 14  6 - 23 (mg/dL)   Creatinine, Ser 9.14  0.50 - 1.10 (mg/dL)   Calcium 9.3  8.4 - 78.2 (mg/dL)   Total Protein 7.8  6.0 - 8.3 (g/dL)   Albumin 3.8  3.5 - 5.2 (g/dL)   AST 20  0 - 37 (U/L)   ALT 22  0 - 35 (U/L)   Alkaline Phosphatase 66  39 - 117 (U/L)   Total Bilirubin 0.2 (*) 0.3 - 1.2 (mg/dL)   GFR calc non Af Amer >90  >90 (mL/min)   GFR calc Af Amer >90  >90 (mL/min)  URINALYSIS, ROUTINE W REFLEX MICROSCOPIC      Component Value Range   Color, Urine YELLOW  YELLOW    APPearance CLEAR  CLEAR    Specific Gravity, Urine 1.030  1.005 - 1.030    pH 6.0  5.0 - 8.0    Glucose, UA NEGATIVE  NEGATIVE (mg/dL)   Hgb urine dipstick NEGATIVE  NEGATIVE    Bilirubin Urine NEGATIVE  NEGATIVE    Ketones, ur NEGATIVE  NEGATIVE (mg/dL)   Protein, ur NEGATIVE  NEGATIVE (mg/dL)   Urobilinogen, UA 0.2  0.0 - 1.0 (mg/dL)   Nitrite NEGATIVE  NEGATIVE    Leukocytes, UA NEGATIVE  NEGATIVE   PREGNANCY, URINE      Component Value Range   Preg Test, Ur NEGATIVE    LIPASE, BLOOD      Component Value Range   Lipase 23  11 - 59 (U/L)   US Abdomen Complete  03/09/2011  *RADIOLOGY REPORT*  Clinical Data:  Were right upper  quadrant pain, mid epigastric and back pain.  History of gallstones.  COMPLETE ABDOMINAL ULTRASOUND  Comparison:  10/20/2007 CT  Findings:  Gallbladder:  Cholelithiasis.  Wall echo shadow sign.  No wall thickening or pericholecystic fluid.  Negative sonographic Murphy's sign.  Common bile duct:  Dilated up to 8 mm.  The distal duct is obscured.  There is a questionable sludge or debris within the distal duct however no shadowing stones identified.  Liver:  No focal lesion identified.  Within normal limits in parenchymal echogenicity.  IVC:  Appears normal.  Pancreas:  Pancreatic duct measures up to 2 mm.  No focal abnormality identified.  Spleen:  Normal sonographic appearance measuring 10 cm.  Right Kidney:  Normal sonographic appearance measuring 11.1 cm.  No hydronephrosis.  Left  Kidney:  Normal sonographic appearance, measuring 11.3 cm. Hydronephrosis.  Abdominal aorta:  Normal sonographic appearance without aneurysmal dilatation, measuring 2.1 cm.  IMPRESSION: Cholelithiasis without sonographic evidence for cholecystitis.  There is dilatation of the CBD up to 8 mm, which allowing for differences in technique is larger than the 2009 CT. While no shadowing stones identified, there is questionable sludge/debris within the distal duct. LFT correlation and consideration for ERCP recommended.  Original Report Authenticated By: Waneta Martins, M.D.   She did not get relief with either Dilaudid or GI cocktail.  Recommend admission for pain control, completion of her workup.    Medical screening examination/treatment/procedure(s) were conducted as a shared visit with non-physician practitioner(s) and myself.  I personally evaluated the patient during the encounter   Carleene Cooper III, MD 04/08/11 0011  Carleene Cooper III, MD 04/08/11 [phone]

## 2011-04-08 NOTE — ED Provider Notes (Signed)
History     CSN: [id number]  Arrival date & time 04/07/11  2009   First MD Initiated Contact with Patient 04/07/11 2120      Chief Complaint  Patient presents with  . Cholelithiasis    (Consider location/radiation/quality/duration/timing/severity/associated sxs/prior treatment) Patient is a 26 y.o. female presenting with abdominal pain. The history is provided by the patient.  Abdominal Pain The primary symptoms of the illness include abdominal pain. The primary symptoms of the illness do not include fever, shortness of breath or hematemesis.  Pt with known hx cholelithiasis with dilated CBD on [place]. Ate a hot dog at approx 3am and began to have epigastric pain with 1 episode vomiting around 330 PM today. Pain with some radiation to RUQ and back. Pain burning, severe, waxing and waning. Denies nausea at time of examination. No prior tx today. Pt reports she has an appt with GI in the next few weeks but is hurting too badly to wait.   Also reports rectal pain after attempting to pass hard stool assoc with some BRB on outside of stool. Denies lightheadedness, weakness, or hx anemia.  Past Medical History  Diagnosis Date  . Cholecystolithiasis   . Obesity   . Migraine   . Anxiety   . Gallstones   . Asthma     Past Surgical History  Procedure Date  . Appendectomy     History reviewed. No pertinent family history.  History  Substance Use Topics  . Smoking status: Not on file  . Smokeless tobacco: Not on file  . Alcohol Use: No     Review of Systems  Constitutional: Negative for fever.  Respiratory: Negative for shortness of breath.   Gastrointestinal: Positive for abdominal pain. Negative for hematemesis.  10 systems reviewed and are negative for acute change except as noted in the HPI.   Allergies  Review of patient's allergies indicates no known allergies.  Home Medications   Current Outpatient Rx  Name Route Sig Dispense Refill  . GOODYS BODY PAIN PO Oral Take  1 packet by mouth 2 (two) times daily as needed. For pain    . CYCLOBENZAPRINE HCL 10 MG PO TABS Oral Take 1 tablet (10 mg total) by mouth 2 (two) times daily as needed for muscle spasms. 20 tablet 0  . MEDROXYPROGESTERONE ACETATE 150 MG/ML IM SUSP Intramuscular Inject 150 mg into the muscle every 3 (three) months.    . OMEPRAZOLE 20 MG PO CPDR Oral Take 20 mg by mouth daily.    . TRAMADOL HCL 50 MG PO TABS Oral Take 1 tablet (50 mg total) by mouth every 6 (six) hours as needed for pain. 15 tablet 0  . LEVONORGESTREL 1.5 MG PO TABS Oral Take 1 tablet by mouth as needed. Emergency contraceptive      BP 138/81  Pulse 92  Temp(Src) 98.5 F (36.9 C) (Oral)  Resp 24  Wt 210 lb 1 oz (95.284 kg)  SpO2 100%  Physical Exam  Nursing note and vitals reviewed. Constitutional: She is oriented to person, place, and time. She appears well-developed and well-nourished.       Uncomfortable appearing  HENT:  Head: Normocephalic and atraumatic.  Right Ear: External ear normal.  Left Ear: External ear normal.  Mouth/Throat: Oropharynx is clear and moist.  Eyes: Conjunctivae are normal. Pupils are equal, round, and reactive to light.  Neck: Normal range of motion. Neck supple.  Cardiovascular: Normal rate and regular rhythm.   No murmur heard. Pulmonary/Chest: Effort normal and  breath sounds normal. No respiratory distress. She exhibits no tenderness.  Abdominal: Soft. Bowel sounds are normal. She exhibits no distension. There is tenderness in the epigastric area. There is guarding. There is no rigidity, no rebound and negative Murphy's sign.         obese  Genitourinary: Rectal exam shows external hemorrhoid and tenderness. Rectal exam shows anal tone normal.  Musculoskeletal: She exhibits no edema and no tenderness.  Lymphadenopathy:    She has no cervical adenopathy.  Neurological: She is alert and oriented to person, place, and time. No cranial nerve deficit.  Skin: Skin is warm and dry. No  rash noted.    ED Course  Procedures (including critical care time)  Labs Reviewed  CBC - Abnormal; Notable for the following:    Hemoglobin 11.9 (*)    HCT 35.4 (*)    All other components within normal limits  COMPREHENSIVE METABOLIC PANEL - Abnormal; Notable for the following:    Total Bilirubin 0.2 (*)    All other components within normal limits  URINALYSIS, ROUTINE W REFLEX MICROSCOPIC  PREGNANCY, URINE  LIPASE, BLOOD   No results found.   1. Cholecystolithiasis   2. Obesity   3. Gallstones   4. Hemorrhoid       MDM  9:30 PM Pt seen and evaluated. Known cholelithiasis with poss CBD stone. Labs ordered to eval liver enzymes, lipase. Pain/nausea medication ordered.    11:00 PM Labs without any acute findings to suggest CBD obstruction, cholecystitis, pancreatitis. Pt with continued c/o epigastric burning. GI cocktail ordered to r/o reflux contributing to symptoms.   12:15 AM Pt with continued pain, no change after GI cocktail. Now describes radiation of pain more to RUQ, with TTP to same area that was not present on initial examination. After discussion with attending MD, Dr Ignacia Palma, feel pt would be best served by admission for further pain management and definitive treatment of gallstones.   Medical screening examination/treatment/procedure(s) were conducted as a shared visit with non-physician practitioner(s) and myself.  I personally evaluated the patient during the encounter Pt with known cholelithiasis, epigastric pain not responding to Dilaudid or GI cocktail.  Recommend admission for pain control, completion of workup. Osvaldo Human, M.D.      Elwyn Reach [place], [place] 04/08/11 0033   On re-assessment, pt now feels much better. Pain mild and described as "nagging". No abdominal pain to palpation with distraction. Pt thinks the GI cocktail helped a good bit. Requests d/c home and I will oblige. Hospitalist involved in this decision and agrees  she is not appropriate for admission at this time.  [address] [place], [place] 04/08/11 0038  Carleene Cooper III, MD 04/08/11 [phone]

## 2011-04-08 NOTE — Consult Note (Signed)
Was asked to see the admit the patient by ER PA, went to see the patient who was resting comfortably, said "I had told them I have a baby and cannot stay any longer", she refused admission and wanted to follow out patient with GI and surgery. Her labs looked stable and she had NO RUQ tenderness whatsoever. ER PA talked to the patient and decided to discharge her with out patient follow ups. If CBD dilatation becomes an issue consider MRCP before invasive tests.

## 2011-04-11 ENCOUNTER — Ambulatory Visit: Payer: Medicaid Other | Admitting: Internal Medicine

## 2011-04-18 ENCOUNTER — Encounter: Payer: Self-pay | Admitting: Internal Medicine

## 2011-04-20 ENCOUNTER — Ambulatory Visit: Payer: Medicaid Other | Admitting: Internal Medicine

## 2011-04-24 ENCOUNTER — Ambulatory Visit (INDEPENDENT_AMBULATORY_CARE_PROVIDER_SITE_OTHER): Payer: Medicaid Other | Admitting: Internal Medicine

## 2011-04-24 ENCOUNTER — Telehealth: Payer: Self-pay | Admitting: *Deleted

## 2011-04-24 ENCOUNTER — Other Ambulatory Visit (INDEPENDENT_AMBULATORY_CARE_PROVIDER_SITE_OTHER): Payer: Medicaid Other

## 2011-04-24 ENCOUNTER — Encounter: Payer: Self-pay | Admitting: Internal Medicine

## 2011-04-24 DIAGNOSIS — K8020 Calculus of gallbladder without cholecystitis without obstruction: Secondary | ICD-10-CM

## 2011-04-24 DIAGNOSIS — K649 Unspecified hemorrhoids: Secondary | ICD-10-CM

## 2011-04-24 DIAGNOSIS — R109 Unspecified abdominal pain: Secondary | ICD-10-CM

## 2011-04-24 DIAGNOSIS — R1013 Epigastric pain: Secondary | ICD-10-CM

## 2011-04-24 DIAGNOSIS — R933 Abnormal findings on diagnostic imaging of other parts of digestive tract: Secondary | ICD-10-CM

## 2011-04-24 LAB — CBC WITH DIFFERENTIAL/PLATELET
Basophils Relative: 0.6 % (ref 0.0–3.0)
Eosinophils Relative: 2.2 % (ref 0.0–5.0)
HCT: 35.5 % — ABNORMAL LOW (ref 36.0–46.0)
Lymphs Abs: 3.9 10*3/uL (ref 0.7–4.0)
MCV: 86 fl (ref 78.0–100.0)
Monocytes Absolute: 0.8 10*3/uL (ref 0.1–1.0)
Monocytes Relative: 7 % (ref 3.0–12.0)
Neutrophils Relative %: 56.5 % (ref 43.0–77.0)
RBC: 4.13 Mil/uL (ref 3.87–5.11)
WBC: 11.4 10*3/uL — ABNORMAL HIGH (ref 4.5–10.5)

## 2011-04-24 LAB — COMPREHENSIVE METABOLIC PANEL
AST: 16 U/L (ref 0–37)
Alkaline Phosphatase: 62 U/L (ref 39–117)
BUN: 12 mg/dL (ref 6–23)
Creatinine, Ser: 0.8 mg/dL (ref 0.4–1.2)

## 2011-04-24 MED ORDER — HYOSCYAMINE SULFATE 0.125 MG SL SUBL: 0.1250 mg | SUBLINGUAL_TABLET | SUBLINGUAL | Status: DC | PRN

## 2011-04-24 MED ORDER — DOCUSATE SODIUM 100 MG PO CAPS: 100.0000 mg | ORAL_CAPSULE | Freq: Every day | ORAL | Status: DC | PRN

## 2011-04-24 MED ORDER — HYDROCORTISONE ACE-PRAMOXINE 1-1 % RE FOAM: 1.0000 | Freq: Two times a day (BID) | RECTAL | Status: DC

## 2011-04-24 NOTE — Telephone Encounter (Signed)
Message copied by Florene Glen on Tue Apr 24, 2011  4:19 PM ------      Message from: Beverley Fiedler      Created: Tue Apr 24, 2011 12:46 PM       Labs reviewed, liver numbers are totally normal      White blood cell count is slightly elevated but in the same range it's been for over a month      I continue to recommend surgical referral for cholecystectomy, I do not think she needs an ERCP at present

## 2011-04-24 NOTE — Patient Instructions (Addendum)
You have been given a separate informational sheet regarding your tobacco use, the importance of quitting and local resources to help you quit.  Your physician has requested that you go to the basement for the following lab work before leaving today:CBC, Lipase, CBC  We have sent the following medications to your pharmacy for you to pick up at your convenience: Colace, Levsin, Proctofoam.  You will be contacted regarding your referral to [facility] for possible Cholecystectomy.

## 2011-04-24 NOTE — Progress Notes (Signed)
Subjective:    Patient ID: [name], female    DOB: Jun 22, 1985, 26 y.o.   MRN: [id number]  HPI Ms. Hewett is a 26 yo female with PMH of anxiety, migraines, gallstones, and asthma who is seen in consultation after recent ER visit for evaluation of epigastric pain, gallstones, and abnormal GI imaging.  The patient reports ongoing issues with intermittent epigastric pain associated with nausea and vomiting. This is been going on for the better part of a month. She reports that her epigastric pain is worse with fatty foods, particularly "hot dogs" and with "fast food".  She denies hematemesis. She has been using Zofran as needed for nausea. She's also using ibuprofen for her pain. She reports the epigastric pain is usually postprandial, 1-2 hours after eating, and she describes this as a crescendo decrescendo type pain.  It often wakes her from sleep. She denies weight loss, in fact reports some weight gain. Appetite is good but she is somewhat nervous to eat. She does report intermittent constipation, and she is using Colace 100 mg on most days. She reports occasional with hard stool and straining she sees bright red blood on the toilet tissue. She's previously associated this with her hemorrhoids. No melena. No fevers or chills.  Review of Systems Constitutional: Negative for fever, chills, night sweats, activity change, appetite change and unexpected weight change HEENT: Negative for sore throat, mouth sores and trouble swallowing. Eyes: Negative for visual disturbance Respiratory: Positive for him to cough, no chest tightness or shortness of breath Cardiovascular: Negative for chest pain, palpitations and lower extremity swelling Gastrointestinal: See history of present illness Genitourinary: Negative for dysuria and hematuria. Musculoskeletal: Positive for back pain, negative for arthralgias and myalgias Skin: Negative for rash or color change Neurological: Negative for headaches, weakness,  numbness Hematological: Negative for adenopathy, negative for easy bruising/bleeding Psychiatric/behavioral: Negative for depressed mood, negative for anxiety   Past Medical History  Diagnosis Date  . Scoliosis   . Obesity   . Migraine   . Anxiety   . Gallstones   . Asthma   . Arthritis    History reviewed. No pertinent past surgical history.  Current Outpatient Prescriptions  Medication Sig Dispense Refill  . Aspirin-Acetaminophen (GOODYS BODY PAIN PO) Take 1 packet by mouth 2 (two) times daily as needed. For pain      . calcium carbonate (TUMS - DOSED IN MG ELEMENTAL CALCIUM) 500 MG chewable tablet Chew 1 tablet by mouth as needed.      . medroxyPROGESTERone (DEPO-PROVERA) 150 MG/ML injection Inject 150 mg into the muscle every 3 (three) months.      Marland Kitchen omeprazole (PRILOSEC) 20 MG capsule Take 20 mg by mouth daily.      Marland Kitchen docusate sodium (COLACE) 100 MG capsule Take 1 capsule (100 mg total) by mouth daily as needed for constipation.  30 capsule  1  . hydrocortisone-pramoxine (PROCTOFOAM-HC) rectal foam Place 1 applicator rectally every 12 (twelve) hours.  10 g  1  . hyoscyamine (LEVSIN/SL) 0.125 MG SL tablet Place 1 tablet (0.125 mg total) under the tongue every 4 (four) hours as needed for cramping.  30 tablet  0   No Known Allergies  Family History  Problem Relation Age of Onset  . Prostate cancer Maternal Grandfather   . Colon polyps Maternal Grandmother   . Diabetes Mother   . Diabetes Father   . Heart disease Father   . Cirrhosis Paternal Grandfather     Social History  . Marital Status:  Single    Number of Children: 3   Occupational History  . UNEMPLOYED    Social History Main Topics  . Smoking status: Current Everyday Smoker -- 0.5 packs/day for 12 years    Types: Cigarettes  . Smokeless tobacco: Never Used  . Alcohol Use: Yes     rarely  . Drug Use: No      Objective:   Physical Exam BP 112/68  Ht 5\' 3"  (1.6 m)  Wt 207 lb (93.895 kg)  BMI 36.67  kg/m2 Constitutional: Well-developed and well-nourished. No distress. HEENT: Normocephalic and atraumatic. Oropharynx is clear and moist. No oropharyngeal exudate. Conjunctivae are normal. Pupils are equal round and reactive to light. No scleral icterus. Neck: Neck supple. Trachea midline. Cardiovascular: Normal rate, regular rhythm and intact distal pulses. No M/R/G Pulmonary/chest: Effort normal and breath sounds normal. No wheezing, rales or rhonchi. Abdominal: Soft, mild epigastric tenderness without rebound or guarding, nondistended. Bowel sounds active throughout. There are no masses palpable. No hepatosplenomegaly. Extremities: no clubbing, cyanosis, or edema Lymphadenopathy: No cervical adenopathy noted. Neurological: Alert and oriented to person place and time. Skin: Skin is warm and dry. No rashes noted. Psychiatric: Normal mood and affect. Behavior is normal.  CMP     Component Value Date/Time   NA 138 04/07/2011 2156   K 3.5 04/07/2011 2156   CL 104 04/07/2011 2156   CO2 22 04/07/2011 2156   GLUCOSE 80 04/07/2011 2156   BUN 14 04/07/2011 2156   CREATININE 0.86 04/07/2011 2156   CALCIUM 9.3 04/07/2011 2156   PROT 7.8 04/07/2011 2156   ALBUMIN 3.8 04/07/2011 2156   AST 20 04/07/2011 2156   ALT 22 04/07/2011 2156   ALKPHOS 66 04/07/2011 2156   BILITOT 0.2* 04/07/2011 2156   GFRNONAA >90 04/07/2011 2156   GFRAA >90 04/07/2011 2156    CBC    Component Value Date/Time   WBC 10.5 04/07/2011 2156   RBC 4.22 04/07/2011 2156   HGB 11.9* 04/07/2011 2156   HCT 35.4* 04/07/2011 2156   PLT 232 04/07/2011 2156   MCV 83.9 04/07/2011 2156   MCH 28.2 04/07/2011 2156   MCHC 33.6 04/07/2011 2156   RDW 15.0 04/07/2011 2156   LYMPHSABS 4.8* 03/09/2011 2105   MONOABS 0.7 03/09/2011 2105   EOSABS 0.3 03/09/2011 2105   BASOSABS 0.0 03/09/2011 2105    Lipase     Component Value Date/Time   LIPASE 23 04/07/2011 2156    COMPLETE ABDOMINAL ULTRASOUND 03/09/11   Comparison:  10/20/2007 CT     Findings:   Gallbladder:  Cholelithiasis.  Wall echo shadow sign.  No wall thickening or pericholecystic fluid.  Negative sonographic Murphy's sign.   Common bile duct:  Dilated up to 8 mm.  The distal duct is obscured.  There is a questionable sludge or debris within the distal duct however no shadowing stones identified.   Liver:  No focal lesion identified.  Within normal limits in parenchymal echogenicity.   IVC:  Appears normal.   Pancreas:  Pancreatic duct measures up to 2 mm.  No focal abnormality identified.   Spleen:  Normal sonographic appearance measuring 10 cm.   Right Kidney:  Normal sonographic appearance measuring 11.1 cm.  No hydronephrosis.   Left Kidney:  Normal sonographic appearance, measuring 11.3 cm. Hydronephrosis.   Abdominal aorta:  Normal sonographic appearance without aneurysmal dilatation, measuring 2.1 cm.   IMPRESSION: Cholelithiasis without sonographic evidence for cholecystitis.   There is dilatation of the CBD up to 8 mm,  which allowing for differences in technique is larger than the 2009 CT. While no shadowing stones identified, there is questionable sludge/debris within the distal duct. LFT correlation and consideration for ERCP recommended.     Assessment & Plan:  26 yo female with PMH of anxiety, migraines, gallstones, and asthma who is seen in consultation after recent ER visit for evaluation of epigastric pain, gallstones, and abnormal GI imaging.  1. Cholelithiasis/abdominal pain/nausea/vomiting -- the patient's constellation of symptoms at present is most consistent with gallbladder type pain. While I cannot exclude underlying PUD, she is on daily PPI and has been for some time. She did have a slightly dilated common bile duct in December by ultrasound, however recently at an ER visit her LFTs were unremarkable.  Given this, I feel it is unlikely that she has significant common bile duct obstruction at present. I feel the best way  to proceed would be surgical referral for cholecystectomy. At the time of gallbladder removal, an intraoperative cholangiogram can be performed, and subsequent ERCP should this be positive.  I do not feel there is enough evidence for preoperative ERCP at this time, and I also do not feel that MRCP would be particularly helpful if her liver enzymes remain normal.  I will check labs today to include CBC, CMP, and lipase.  2. Hemorrhoids -- the patient reports a long-standing problem with hemorrhoids after her first pregnancy. She also reports these are problems when her stool is hard area I've recommended that she attempt to take Colace daily to twice daily as needed. I will give her prescription for Proctofoam to be used for her hemorrhoids. I've asked that she let us know if this continues to be a problem for her. She voiced understanding.

## 2011-04-24 NOTE — Telephone Encounter (Signed)
Informed pt of Dr Lauro Franklin finding and recommendations; she needs to go ahead and see the surgeon. Gave pt her appt with Dr Gaynelle Adu and mailed her a map; pt stated understanding.

## 2011-05-07 ENCOUNTER — Encounter (INDEPENDENT_AMBULATORY_CARE_PROVIDER_SITE_OTHER): Payer: Self-pay | Admitting: General Surgery

## 2011-05-07 ENCOUNTER — Ambulatory Visit (INDEPENDENT_AMBULATORY_CARE_PROVIDER_SITE_OTHER): Payer: Medicaid Other | Admitting: General Surgery

## 2011-05-07 VITALS — BP 136/80 | HR 80 | Resp 16 | Ht 63.0 in | Wt 207.0 lb

## 2011-05-07 DIAGNOSIS — K8020 Calculus of gallbladder without cholecystitis without obstruction: Secondary | ICD-10-CM

## 2011-05-07 NOTE — Progress Notes (Signed)
Patient ID: [name], female   DOB: 12/18/1985, 26 y.o.   MRN: [id number]  Chief Complaint  Patient presents with  . Abdominal Pain    HPI [name] is a 26 y.o. female.   HPI 25 yo Caucasian female referred by Dr Pyrtle for evaluation for cholecystectomy. The patient states that she's had a several month history of epigastric pain. She states it is different than her reflux. It does radiate to the left side. It is associated with nausea and some occasional emesis. She states that initially it developed after eating fried or greasy foods. The pain will last several hours. She describes it as an irritating and nagging pain. She states that she has changed her diet and she is now having more frequent episodes. It is almost occurring on a daily basis. She has noticed no change in her symptoms while being on a PPI or taking TUMS. She does have some baseline constipation. She denies any melena, hematochezia weight loss, or acholic stools. She states that she has in fact gained 22 pounds over the past couple months. She spent to the ER at least once for these symptoms. She has also taken some ibuprofen without any relief.     Past Medical History  Diagnosis Date  . Scoliosis   . Obesity   . Migraine   . Anxiety   . Gallstones   . Asthma   . Arthritis     Past Surgical History  Procedure Date  . Abdominal exploration surgery approx 26 years old    rlq    Family History  Problem Relation Age of Onset  . Prostate cancer Maternal Grandfather   . Colon polyps Maternal Grandmother   . Diabetes Mother   . Diabetes Father   . Heart disease Father   . Cirrhosis Paternal Grandfather   . Endometriosis Mother     Social History History  Substance Use Topics  . Smoking status: Current Everyday Smoker -- 0.5 packs/day for 12 years    Types: Cigarettes  . Smokeless tobacco: Never Used  . Alcohol Use: Yes     rarely    No Known Allergies  Current Outpatient Prescriptions    Medication Sig Dispense Refill  . calcium carbonate (TUMS - DOSED IN MG ELEMENTAL CALCIUM) 500 MG chewable tablet Chew 1 tablet by mouth as needed.      . docusate sodium (COLACE) 100 MG capsule Take 1 capsule (100 mg total) by mouth daily as needed for constipation.  30 capsule  1  . hydrocortisone-pramoxine (PROCTOFOAM-HC) rectal foam Place 1 applicator rectally every 12 (twelve) hours.  10 g  1  . medroxyPROGESTERone (DEPO-PROVERA) 150 MG/ML injection Inject 150 mg into the muscle every 3 (three) months.      . omeprazole (PRILOSEC) 20 MG capsule Take 20 mg by mouth daily.        Review of Systems Review of Systems  Constitutional: Positive for unexpected weight change (wt gain). Negative for fever and chills.  HENT: Negative for hearing loss, congestion, sore throat, trouble swallowing and voice change.   Eyes: Negative for visual disturbance.  Respiratory: Negative for apnea, cough and wheezing.   Cardiovascular: Negative for chest pain, palpitations and leg swelling.  Gastrointestinal: Positive for constipation. Negative for vomiting, diarrhea and anal bleeding.  Genitourinary: Positive for frequency. Negative for dysuria, urgency, hematuria, vaginal bleeding and difficulty urinating.  Musculoskeletal: Negative for arthralgias.  Skin: Negative for rash and wound.  Neurological: Negative for seizures, syncope and headaches.    Hematological: Negative for adenopathy.  Psychiatric/Behavioral: Negative for confusion.    Blood pressure 136/80, pulse 80, resp. rate 16, height 5' 3" (1.6 m), weight 207 lb (93.895 kg).  Physical Exam Physical Exam  Vitals reviewed. Constitutional: She is oriented to person, place, and time. She appears well-developed and well-nourished. No distress.       obese  HENT:  Head: Normocephalic and atraumatic.  Right Ear: External ear normal.  Left Ear: External ear normal.  Mouth/Throat: No oropharyngeal exudate.  Eyes: Conjunctivae are normal. No  scleral icterus.  Neck: Normal range of motion. Neck supple. No tracheal deviation present. No thyromegaly present.  Cardiovascular: Normal rate, regular rhythm and normal heart sounds.   Pulmonary/Chest: Effort normal and breath sounds normal. No respiratory distress. She has no wheezes.  Abdominal: Soft. Bowel sounds are normal. There is tenderness (very mild epigastric tenderness). There is no rebound and no guarding.    Musculoskeletal: Normal range of motion. She exhibits no edema.  Neurological: She is alert and oriented to person, place, and time.  Skin: Skin is warm and dry. No rash noted. No erythema.  Psychiatric: She has a normal mood and affect. Judgment and thought content normal.    Data Reviewed Dr Pyrtle's note Labs from Jan 2013 Abd u/s 03/09/11:   Gallbladder: Cholelithiasis. Wall echo shadow sign. No wall  thickening or pericholecystic fluid. Negative sonographic Murphy's  sign.   Common bile duct: Dilated up to 8 mm. The distal duct is  obscured. There is a questionable sludge or debris within the  distal duct however no shadowing stones identified.   Liver: No focal lesion identified. Within normal limits in  parenchymal echogenicity.  IVC: Appears normal.  Pancreas: Pancreatic duct measures up to 2 mm. No focal  abnormality identified.  Spleen: Normal sonographic appearance measuring 10 cm.  Right Kidney: Normal sonographic appearance measuring 11.1 cm. No  hydronephrosis.  Left Kidney: Normal sonographic appearance, measuring 11.3 cm.  Hydronephrosis.  Abdominal aorta: Normal sonographic appearance without aneurysmal  dilatation, measuring 2.1 cm.   IMPRESSION:  Cholelithiasis without sonographic evidence for cholecystitis.  There is dilatation of the CBD up to 8 mm, which allowing for  differences in technique is larger than the 2009 CT. While no  shadowing stones identified, there is questionable sludge/debris  within the distal duct. LFT  correlation and consideration for ERCP  recommended.   Assessment    Symptomatic cholelithiasis    Plan    We discussed gallbladder disease. The patient was given educational material. We discussed non-operative and operative management.   I agree with Dr Pyrtle. I believe the patient does not need a preop MRCP or ERCP since her LFTs are normal.  I discussed laparoscopic cholecystectomy with IOC in detail.  The patient was given educational material as well as diagrams detailing the procedure.  We discussed the risks and benefits of a laparoscopic cholecystectomy including, but not limited to bleeding, infection, injury to surrounding structures such as the intestine or liver, bile leak, retained gallstones, need to convert to an open procedure, prolonged diarrhea, blood clots such as  DVT, common bile duct injury, anesthesia risks, and possible need for additional procedures.  We discussed the typical post-operative recovery course. I explained that the likelihood of improvement of their symptoms is fair to good.  Nanna Ertle M. Asaf Elmquist, MD, FACS General, Bariatric, & Minimally Invasive Surgery [facility]         Jameil Whitmoyer M 05/07/2011, 4:34 PM    

## 2011-05-07 NOTE — Patient Instructions (Signed)

## 2011-05-08 ENCOUNTER — Encounter (HOSPITAL_COMMUNITY): Payer: Self-pay | Admitting: Pharmacy Technician

## 2011-05-08 ENCOUNTER — Encounter (HOSPITAL_COMMUNITY): Payer: Self-pay | Admitting: *Deleted

## 2011-05-08 DIAGNOSIS — N739 Female pelvic inflammatory disease, unspecified: Secondary | ICD-10-CM

## 2011-05-08 HISTORY — DX: Female pelvic inflammatory disease, unspecified: N73.9

## 2011-05-10 ENCOUNTER — Other Ambulatory Visit (INDEPENDENT_AMBULATORY_CARE_PROVIDER_SITE_OTHER): Payer: Self-pay | Admitting: General Surgery

## 2011-05-10 ENCOUNTER — Encounter (HOSPITAL_COMMUNITY)
Admission: RE | Disposition: A | Discharge status: Home / Self Care | Payer: Self-pay | Source: Ambulatory Visit | Attending: General Surgery

## 2011-05-10 ENCOUNTER — Ambulatory Visit (HOSPITAL_COMMUNITY)
Admission: RE | Admit: 2011-05-10 | Discharge: 2011-05-10 | Disposition: A | Discharge status: Home / Self Care | Payer: Medicaid Other | Source: Ambulatory Visit | Attending: General Surgery | Admitting: General Surgery

## 2011-05-10 ENCOUNTER — Ambulatory Visit (HOSPITAL_COMMUNITY): Payer: Medicaid Other | Admitting: Anesthesiology

## 2011-05-10 ENCOUNTER — Encounter (HOSPITAL_COMMUNITY): Payer: Self-pay | Admitting: Anesthesiology

## 2011-05-10 ENCOUNTER — Encounter (HOSPITAL_COMMUNITY): Payer: Self-pay | Admitting: *Deleted

## 2011-05-10 ENCOUNTER — Ambulatory Visit (HOSPITAL_COMMUNITY): Payer: Medicaid Other

## 2011-05-10 DIAGNOSIS — K8010 Calculus of gallbladder with chronic cholecystitis without obstruction: Secondary | ICD-10-CM

## 2011-05-10 DIAGNOSIS — K8020 Calculus of gallbladder without cholecystitis without obstruction: Secondary | ICD-10-CM

## 2011-05-10 DIAGNOSIS — E669 Obesity, unspecified: Secondary | ICD-10-CM | POA: Insufficient documentation

## 2011-05-10 DIAGNOSIS — Z79899 Other long term (current) drug therapy: Secondary | ICD-10-CM | POA: Insufficient documentation

## 2011-05-10 HISTORY — PX: CHOLECYSTECTOMY: SHX55

## 2011-05-10 HISTORY — DX: Female pelvic inflammatory disease, unspecified: N73.9

## 2011-05-10 LAB — COMPREHENSIVE METABOLIC PANEL
ALT: 13 U/L (ref 0–35)
BUN: 13 mg/dL (ref 6–23)
CO2: 23 mEq/L (ref 19–32)
Calcium: 9.1 mg/dL (ref 8.4–10.5)
Creatinine, Ser: 0.83 mg/dL (ref 0.50–1.10)
GFR calc Af Amer: >90 mL/min (ref >90)
GFR calc non Af Amer: >90 mL/min (ref >90)
Glucose, Bld: 89 mg/dL (ref 70–99)
Sodium: 139 mEq/L (ref 135–145)
Total Protein: 7.5 g/dL (ref 6.0–8.3)

## 2011-05-10 LAB — CBC
HCT: 34.5 % — ABNORMAL LOW (ref 36.0–46.0)
Hemoglobin: 11.3 g/dL — ABNORMAL LOW (ref 12.0–15.0)
MCH: 27.5 pg (ref 26.0–34.0)
MCHC: 32.8 g/dL (ref 30.0–36.0)
MCV: 83.9 fL (ref 78.0–100.0)
RBC: 4.11 MIL/uL (ref 3.87–5.11)

## 2011-05-10 LAB — DIFFERENTIAL
Basophils Absolute: 0 10*3/uL (ref 0.0–0.1)
Eosinophils Relative: 3 % (ref 0–5)
Lymphocytes Relative: 35 % (ref 12–46)
Neutrophils Relative %: 54 % (ref 43–77)

## 2011-05-10 LAB — SURGICAL PCR SCREEN
MRSA, PCR: NEGATIVE
Staphylococcus aureus: NEGATIVE

## 2011-05-10 SURGERY — LAPAROSCOPIC CHOLECYSTECTOMY WITH INTRAOPERATIVE CHOLANGIOGRAM
Anesthesia: General | Site: Abdomen | Wound class: Clean Contaminated

## 2011-05-10 MED ORDER — ONDANSETRON HCL 4 MG PO TABS: 4.0000 mg | ORAL_TABLET | Freq: Four times a day (QID) | ORAL | Status: DC | PRN

## 2011-05-10 MED ORDER — MUPIROCIN 2 % EX OINT
TOPICAL_OINTMENT | CUTANEOUS | Status: AC
  Filled 2011-05-10: qty 22

## 2011-05-10 MED ORDER — OXYCODONE HCL 5 MG PO TABS: 5.0000 mg | ORAL_TABLET | ORAL | Status: DC | PRN

## 2011-05-10 MED ORDER — KETOROLAC TROMETHAMINE 30 MG/ML IJ SOLN
INTRAMUSCULAR | Status: AC
  Administered 2011-05-10: 15 mg via INTRAVENOUS
  Filled 2011-05-10: qty 1

## 2011-05-10 MED ORDER — FENTANYL CITRATE 0.05 MG/ML IJ SOLN
INTRAMUSCULAR | Status: DC | PRN
  Administered 2011-05-10 (×5): 50 ug via INTRAVENOUS
  Administered 2011-05-10: 100 ug via INTRAVENOUS
  Administered 2011-05-10 (×2): 50 ug via INTRAVENOUS

## 2011-05-10 MED ORDER — DEXTROSE 5 % IV SOLN
2.0000 g | INTRAVENOUS | Status: DC
  Filled 2011-05-10: qty 2

## 2011-05-10 MED ORDER — FENTANYL CITRATE 0.05 MG/ML IJ SOLN
INTRAMUSCULAR | Status: AC
  Filled 2011-05-10: qty 2

## 2011-05-10 MED ORDER — ALBUTEROL SULFATE HFA 108 (90 BASE) MCG/ACT IN AERS
2.0000 | INHALATION_SPRAY | Freq: Four times a day (QID) | RESPIRATORY_TRACT | Status: DC | PRN
  Filled 2011-05-10: qty 6.7

## 2011-05-10 MED ORDER — ONDANSETRON HCL 4 MG/2ML IJ SOLN
INTRAMUSCULAR | Status: DC | PRN
  Administered 2011-05-10: 4 mg via INTRAVENOUS

## 2011-05-10 MED ORDER — BUPIVACAINE-EPINEPHRINE PF 0.25-1:200000 % IJ SOLN
INTRAMUSCULAR | Status: AC
  Filled 2011-05-10: qty 30

## 2011-05-10 MED ORDER — KETOROLAC TROMETHAMINE 15 MG/ML IJ SOLN: 15.0000 mg | Freq: Four times a day (QID) | INTRAMUSCULAR | Status: DC

## 2011-05-10 MED ORDER — MORPHINE SULFATE 2 MG/ML IJ SOLN: 2.0000 mg | INTRAMUSCULAR | Status: DC | PRN

## 2011-05-10 MED ORDER — FENTANYL CITRATE 0.05 MG/ML IJ SOLN
25.0000 ug | INTRAMUSCULAR | Status: DC | PRN
  Administered 2011-05-10: 25 ug via INTRAVENOUS
  Administered 2011-05-10: 14:00:00 via INTRAVENOUS
  Administered 2011-05-10: 25 ug via INTRAVENOUS

## 2011-05-10 MED ORDER — MIDAZOLAM HCL 5 MG/ML IJ SOLN
INTRAMUSCULAR | Status: AC
  Filled 2011-05-10: qty 1

## 2011-05-10 MED ORDER — MIDAZOLAM HCL 5 MG/5ML IJ SOLN
INTRAMUSCULAR | Status: DC | PRN
  Administered 2011-05-10: 2.5 mg via INTRAVENOUS
  Administered 2011-05-10: 2 mg via INTRAVENOUS

## 2011-05-10 MED ORDER — PROMETHAZINE HCL 25 MG/ML IJ SOLN: 12.5000 mg | Freq: Four times a day (QID) | INTRAMUSCULAR | Status: DC | PRN

## 2011-05-10 MED ORDER — LACTATED RINGERS IR SOLN
Status: DC | PRN
  Administered 2011-05-10: 1000 mL

## 2011-05-10 MED ORDER — ROCURONIUM BROMIDE 100 MG/10ML IV SOLN
INTRAVENOUS | Status: DC | PRN
  Administered 2011-05-10: 10 mg via INTRAVENOUS
  Administered 2011-05-10: 40 mg via INTRAVENOUS
  Administered 2011-05-10: 10 mg via INTRAVENOUS

## 2011-05-10 MED ORDER — ONDANSETRON HCL 4 MG/2ML IJ SOLN: 4.0000 mg | Freq: Four times a day (QID) | INTRAMUSCULAR | Status: DC | PRN

## 2011-05-10 MED ORDER — SODIUM CHLORIDE 0.9 % IV SOLN: 250.0000 mL | INTRAVENOUS | Status: DC | PRN

## 2011-05-10 MED ORDER — IOHEXOL 300 MG/ML  SOLN
INTRAMUSCULAR | Status: DC | PRN
  Administered 2011-05-10: 30 mL via INTRAVENOUS

## 2011-05-10 MED ORDER — LACTATED RINGERS IV SOLN: INTRAVENOUS | Status: DC

## 2011-05-10 MED ORDER — ACETAMINOPHEN 650 MG RE SUPP
650.0000 mg | RECTAL | Status: DC | PRN
  Filled 2011-05-10: qty 1

## 2011-05-10 MED ORDER — OXYCODONE-ACETAMINOPHEN 5-325 MG PO TABS: 1.0000 | ORAL_TABLET | ORAL | Status: AC | PRN

## 2011-05-10 MED ORDER — BUPIVACAINE-EPINEPHRINE 0.25% -1:200000 IJ SOLN
INTRAMUSCULAR | Status: DC | PRN
  Administered 2011-05-10: 30 mL

## 2011-05-10 MED ORDER — CHLORHEXIDINE GLUCONATE 4 % EX LIQD: 1.0000 "application " | Freq: Once | CUTANEOUS | Status: DC

## 2011-05-10 MED ORDER — LACTATED RINGERS IV SOLN
INTRAVENOUS | Status: DC
  Administered 2011-05-10: 13:00:00 via INTRAVENOUS
  Administered 2011-05-10: 1000 mL via INTRAVENOUS

## 2011-05-10 MED ORDER — HYDROCORTISONE ACE-PRAMOXINE 1-1 % RE FOAM
1.0000 | Freq: Two times a day (BID) | RECTAL | Status: DC | PRN
  Filled 2011-05-10: qty 10

## 2011-05-10 MED ORDER — IOHEXOL 300 MG/ML  SOLN
INTRAMUSCULAR | Status: AC
  Filled 2011-05-10: qty 1

## 2011-05-10 MED ORDER — ACETAMINOPHEN 10 MG/ML IV SOLN
INTRAVENOUS | Status: DC | PRN
  Administered 2011-05-10: 1000 mg via INTRAVENOUS

## 2011-05-10 MED ORDER — NEOSTIGMINE METHYLSULFATE 1 MG/ML IJ SOLN
INTRAMUSCULAR | Status: DC | PRN
  Administered 2011-05-10: 4 mg via INTRAVENOUS

## 2011-05-10 MED ORDER — PROPOFOL 10 MG/ML IV BOLUS
INTRAVENOUS | Status: DC | PRN
  Administered 2011-05-10: 150 mg via INTRAVENOUS

## 2011-05-10 MED ORDER — HYDROMORPHONE HCL PF 1 MG/ML IJ SOLN
INTRAMUSCULAR | Status: AC
  Filled 2011-05-10: qty 2

## 2011-05-10 MED ORDER — HYDROMORPHONE HCL PF 1 MG/ML IJ SOLN
0.2500 mg | INTRAMUSCULAR | Status: DC | PRN
  Administered 2011-05-10 (×4): 0.5 mg via INTRAVENOUS

## 2011-05-10 MED ORDER — KCL IN DEXTROSE-NACL 20-5-0.45 MEQ/L-%-% IV SOLN
INTRAVENOUS | Status: DC
  Filled 2011-05-10: qty 1000

## 2011-05-10 MED ORDER — PROMETHAZINE HCL 25 MG/ML IJ SOLN: 6.2500 mg | INTRAMUSCULAR | Status: DC | PRN

## 2011-05-10 MED ORDER — ENOXAPARIN SODIUM 40 MG/0.4ML ~~LOC~~ SOLN: 40.0000 mg | SUBCUTANEOUS | Status: DC

## 2011-05-10 MED ORDER — KETOROLAC TROMETHAMINE 15 MG/ML IJ SOLN: 15.0000 mg | Freq: Once | INTRAMUSCULAR | Status: DC

## 2011-05-10 MED ORDER — SODIUM CHLORIDE 0.9 % IJ SOLN: 3.0000 mL | INTRAMUSCULAR | Status: DC | PRN

## 2011-05-10 MED ORDER — DOCUSATE SODIUM 100 MG PO CAPS
100.0000 mg | ORAL_CAPSULE | Freq: Every day | ORAL | Status: DC
  Filled 2011-05-10: qty 1

## 2011-05-10 MED ORDER — DEXAMETHASONE SODIUM PHOSPHATE 10 MG/ML IJ SOLN
INTRAMUSCULAR | Status: DC | PRN
  Administered 2011-05-10: 10 mg via INTRAVENOUS

## 2011-05-10 MED ORDER — ACETAMINOPHEN 325 MG PO TABS: 650.0000 mg | ORAL_TABLET | ORAL | Status: DC | PRN

## 2011-05-10 MED ORDER — NICOTINE 21 MG/24HR TD PT24
21.0000 mg | MEDICATED_PATCH | Freq: Every day | TRANSDERMAL | Status: DC
  Filled 2011-05-10 (×3): qty 1

## 2011-05-10 MED ORDER — ACETAMINOPHEN 10 MG/ML IV SOLN
1000.0000 mg | Freq: Four times a day (QID) | INTRAVENOUS | Status: DC
Start: 2011-05-10 — End: 2011-05-10

## 2011-05-10 MED ORDER — GLYCOPYRROLATE 0.2 MG/ML IJ SOLN
INTRAMUSCULAR | Status: DC | PRN
  Administered 2011-05-10: .5 mg via INTRAVENOUS

## 2011-05-10 MED ORDER — SODIUM CHLORIDE 0.9 % IJ SOLN: 3.0000 mL | Freq: Two times a day (BID) | INTRAMUSCULAR | Status: DC

## 2011-05-10 MED ORDER — ACETAMINOPHEN 10 MG/ML IV SOLN
INTRAVENOUS | Status: AC
  Filled 2011-05-10: qty 100

## 2011-05-10 SURGICAL SUPPLY — 40 items
APPLIER CLIP 5 13 M/L LIGAMAX5 (MISCELLANEOUS) ×2
APPLIER CLIP ROT 10 11.4 M/L (STAPLE)
BANDAGE ADHESIVE 1X3 (GAUZE/BANDAGES/DRESSINGS) ×6 IMPLANT
BENZOIN TINCTURE PRP APPL 2/3 (GAUZE/BANDAGES/DRESSINGS) ×2 IMPLANT
CANISTER SUCTION 2500CC (MISCELLANEOUS) ×2 IMPLANT
CHLORAPREP W/TINT 26ML (MISCELLANEOUS) ×2 IMPLANT
CLIP APPLIE 5 13 M/L LIGAMAX5 (MISCELLANEOUS) ×1 IMPLANT
CLIP APPLIE ROT 10 11.4 M/L (STAPLE) IMPLANT
CLOTH BEACON ORANGE TIMEOUT ST (SAFETY) ×2 IMPLANT
COVER MAYO STAND STRL (DRAPES) ×2 IMPLANT
COVER SURGICAL LIGHT HANDLE (MISCELLANEOUS) ×2 IMPLANT
DECANTER SPIKE VIAL GLASS SM (MISCELLANEOUS) ×2 IMPLANT
DERMABOND ADVANCED (GAUZE/BANDAGES/DRESSINGS) ×1
DERMABOND ADVANCED .7 DNX12 (GAUZE/BANDAGES/DRESSINGS) ×1 IMPLANT
DRAPE C-ARM 42X72 X-RAY (DRAPES) ×2 IMPLANT
DRAPE LAPAROSCOPIC ABDOMINAL (DRAPES) ×2 IMPLANT
DRAPE UTILITY XL STRL (DRAPES) ×2 IMPLANT
DRSG TEGADERM 2-3/8X2-3/4 SM (GAUZE/BANDAGES/DRESSINGS) ×2 IMPLANT
ELECT REM PT RETURN 9FT ADLT (ELECTROSURGICAL) ×2
ELECTRODE REM PT RTRN 9FT ADLT (ELECTROSURGICAL) ×1 IMPLANT
GLOVE BIO SURGEON STRL SZ7.5 (GLOVE) ×2 IMPLANT
GLOVE BIOGEL PI IND STRL 7.0 (GLOVE) ×1 IMPLANT
GLOVE BIOGEL PI INDICATOR 7.0 (GLOVE) ×1
GLOVE INDICATOR 8.0 STRL GRN (GLOVE) ×2 IMPLANT
GOWN STRL NON-REIN LRG LVL3 (GOWN DISPOSABLE) ×2 IMPLANT
GOWN STRL REIN XL XLG (GOWN DISPOSABLE) ×4 IMPLANT
KIT BASIN OR (CUSTOM PROCEDURE TRAY) ×2 IMPLANT
NS IRRIG 1000ML POUR BTL (IV SOLUTION) ×2 IMPLANT
POUCH SPECIMEN RETRIEVAL 10MM (ENDOMECHANICALS) ×2 IMPLANT
SET CHOLANGIOGRAPH MIX (MISCELLANEOUS) ×2 IMPLANT
SET IRRIG TUBING LAPAROSCOPIC (IRRIGATION / IRRIGATOR) ×2 IMPLANT
SOLUTION ANTI FOG 6CC (MISCELLANEOUS) ×2 IMPLANT
STRIP CLOSURE SKIN 1/2X4 (GAUZE/BANDAGES/DRESSINGS) ×2 IMPLANT
SUT MNCRL AB 4-0 PS2 18 (SUTURE) ×2 IMPLANT
SUT VICRYL 0 UR6 27IN ABS (SUTURE) ×4 IMPLANT
TOWEL OR 17X26 10 PK STRL BLUE (TOWEL DISPOSABLE) ×4 IMPLANT
TRAY LAP CHOLE (CUSTOM PROCEDURE TRAY) ×2 IMPLANT
TROCAR BLADELESS OPT 5 75 (ENDOMECHANICALS) ×6 IMPLANT
TROCAR XCEL BLUNT TIP 100MML (ENDOMECHANICALS) ×2 IMPLANT
TUBING INSUFFLATION 10FT LAP (TUBING) ×2 IMPLANT

## 2011-05-10 NOTE — OR Nursing (Signed)
After arrival to floor, pt began crying, restless, pt demanding to go outside to smoke, informed pt that this was a no smoking facility, pt then became verbally abusive to staff, attempting to calm pt and allowed to verbalize concerns, pt states she needs to go home to be with her children, significant other (boyfriend per pt) called to be with pt in room, pt using profanity to nsg staff. MD contacted to request nicotine patch. Pt provded phone to call family.

## 2011-05-10 NOTE — H&P (View-Only) (Signed)
Patient ID: [name], female   DOB: 1985-06-30, 26 y.o.   MRN: [id number]  Chief Complaint  Patient presents with  . Abdominal Pain    HPI [name] is a 26 y.o. female.   HPI 26 yo Caucasian female referred by Dr Rhea Belton for evaluation for cholecystectomy. The patient states that she's had a several month history of epigastric pain. She states it is different than her reflux. It does radiate to the left side. It is associated with nausea and some occasional emesis. She states that initially it developed after eating fried or greasy foods. The pain will last several hours. She describes it as an irritating and nagging pain. She states that she has changed her diet and she is now having more frequent episodes. It is almost occurring on a daily basis. She has noticed no change in her symptoms while being on a PPI or taking TUMS. She does have some baseline constipation. She denies any melena, hematochezia weight loss, or acholic stools. She states that she has in fact gained 22 pounds over the past couple months. She spent to the ER at least once for these symptoms. She has also taken some ibuprofen without any relief.     Past Medical History  Diagnosis Date  . Scoliosis   . Obesity   . Migraine   . Anxiety   . Gallstones   . Asthma   . Arthritis     Past Surgical History  Procedure Date  . Abdominal exploration surgery approx 26 years old    rlq    Family History  Problem Relation Age of Onset  . Prostate cancer Maternal Grandfather   . Colon polyps Maternal Grandmother   . Diabetes Mother   . Diabetes Father   . Heart disease Father   . Cirrhosis Paternal Grandfather   . Endometriosis Mother     Social History History  Substance Use Topics  . Smoking status: Current Everyday Smoker -- 0.5 packs/day for 12 years    Types: Cigarettes  . Smokeless tobacco: Never Used  . Alcohol Use: Yes     rarely    No Known Allergies  Current Outpatient Prescriptions    Medication Sig Dispense Refill  . calcium carbonate (TUMS - DOSED IN MG ELEMENTAL CALCIUM) 500 MG chewable tablet Chew 1 tablet by mouth as needed.      . docusate sodium (COLACE) 100 MG capsule Take 1 capsule (100 mg total) by mouth daily as needed for constipation.  30 capsule  1  . hydrocortisone-pramoxine (PROCTOFOAM-HC) rectal foam Place 1 applicator rectally every 12 (twelve) hours.  10 g  1  . medroxyPROGESTERone (DEPO-PROVERA) 150 MG/ML injection Inject 150 mg into the muscle every 3 (three) months.      Marland Kitchen omeprazole (PRILOSEC) 20 MG capsule Take 20 mg by mouth daily.        Review of Systems Review of Systems  Constitutional: Positive for unexpected weight change (wt gain). Negative for fever and chills.  HENT: Negative for hearing loss, congestion, sore throat, trouble swallowing and voice change.   Eyes: Negative for visual disturbance.  Respiratory: Negative for apnea, cough and wheezing.   Cardiovascular: Negative for chest pain, palpitations and leg swelling.  Gastrointestinal: Positive for constipation. Negative for vomiting, diarrhea and anal bleeding.  Genitourinary: Positive for frequency. Negative for dysuria, urgency, hematuria, vaginal bleeding and difficulty urinating.  Musculoskeletal: Negative for arthralgias.  Skin: Negative for rash and wound.  Neurological: Negative for seizures, syncope and headaches.  Hematological: Negative for adenopathy.  Psychiatric/Behavioral: Negative for confusion.    Blood pressure 136/80, pulse 80, resp. rate 16, height 5\' 3"  (1.6 m), weight 207 lb (93.895 kg).  Physical Exam Physical Exam  Vitals reviewed. Constitutional: She is oriented to person, place, and time. She appears well-developed and well-nourished. No distress.       obese  HENT:  Head: Normocephalic and atraumatic.  Right Ear: External ear normal.  Left Ear: External ear normal.  Mouth/Throat: No oropharyngeal exudate.  Eyes: Conjunctivae are normal. No  scleral icterus.  Neck: Normal range of motion. Neck supple. No tracheal deviation present. No thyromegaly present.  Cardiovascular: Normal rate, regular rhythm and normal heart sounds.   Pulmonary/Chest: Effort normal and breath sounds normal. No respiratory distress. She has no wheezes.  Abdominal: Soft. Bowel sounds are normal. There is tenderness (very mild epigastric tenderness). There is no rebound and no guarding.    Musculoskeletal: Normal range of motion. She exhibits no edema.  Neurological: She is alert and oriented to person, place, and time.  Skin: Skin is warm and dry. No rash noted. No erythema.  Psychiatric: She has a normal mood and affect. Judgment and thought content normal.    Data Reviewed Dr Lauro Franklin note Labs from Jan 2013 Abd u/s 03/09/11:   Gallbladder: Cholelithiasis. Wall echo shadow sign. No wall  thickening or pericholecystic fluid. Negative sonographic Murphy's  sign.   Common bile duct: Dilated up to 8 mm. The distal duct is  obscured. There is a questionable sludge or debris within the  distal duct however no shadowing stones identified.   Liver: No focal lesion identified. Within normal limits in  parenchymal echogenicity.  IVC: Appears normal.  Pancreas: Pancreatic duct measures up to 2 mm. No focal  abnormality identified.  Spleen: Normal sonographic appearance measuring 10 cm.  Right Kidney: Normal sonographic appearance measuring 11.1 cm. No  hydronephrosis.  Left Kidney: Normal sonographic appearance, measuring 11.3 cm.  Hydronephrosis.  Abdominal aorta: Normal sonographic appearance without aneurysmal  dilatation, measuring 2.1 cm.   IMPRESSION:  Cholelithiasis without sonographic evidence for cholecystitis.  There is dilatation of the CBD up to 8 mm, which allowing for  differences in technique is larger than the 2009 CT. While no  shadowing stones identified, there is questionable sludge/debris  within the distal duct. LFT  correlation and consideration for ERCP  recommended.   Assessment    Symptomatic cholelithiasis    Plan    We discussed gallbladder disease. The patient was given Agricultural engineer. We discussed non-operative and operative management.   I agree with Dr Rhea Belton. I believe the patient does not need a preop MRCP or ERCP since her LFTs are normal.  I discussed laparoscopic cholecystectomy with IOC in detail.  The patient was given educational material as well as diagrams detailing the procedure.  We discussed the risks and benefits of a laparoscopic cholecystectomy including, but not limited to bleeding, infection, injury to surrounding structures such as the intestine or liver, bile leak, retained gallstones, need to convert to an open procedure, prolonged diarrhea, blood clots such as  DVT, common bile duct injury, anesthesia risks, and possible need for additional procedures.  We discussed the typical post-operative recovery course. I explained that the likelihood of improvement of their symptoms is fair to good.  [name]. Andrey Campanile, MD, FACS General, Bariatric, & Minimally Invasive Surgery [facility], [place]         [facility] M 05/07/2011, 4:34 PM

## 2011-05-10 NOTE — Op Note (Signed)
Laparoscopic Cholecystectomy with IOC Procedure Note  Indications: This patient presents with symptomatic gallbladder disease and will undergo laparoscopic cholecystectomy.  Pre-operative Diagnosis: Calculus of gallbladder without mention of cholecystitis or obstruction  Post-operative Diagnosis: Same  Surgeon: Atilano Ina   Assistants: none  Anesthesia: General endotracheal anesthesia and Local anesthesia 0.25.% bupivacaine, with epinephrine  ASA Class: II  Procedure Details  The patient was seen again in the Holding Room. The risks, benefits, complications, treatment options, and expected outcomes were discussed with the patient. The possibilities of reaction to medication, pulmonary aspiration, perforation of viscus, bleeding, recurrent infection, finding a normal gallbladder, the need for additional procedures, failure to diagnose a condition, the possible need to convert to an open procedure, and creating a complication requiring transfusion or operation were discussed with the patient. The likelihood of improving the patient's symptoms with return to their baseline status is good.  The patient and/or family concurred with the proposed plan, giving informed consent. The site of surgery properly noted. The patient was taken to Operating Room, identified as Geana Walts and the procedure verified as Laparoscopic Cholecystectomy with Intraoperative Cholangiogram. A Time Out was held and the above information confirmed.  Prior to the induction of general anesthesia, antibiotic prophylaxis was administered. General endotracheal anesthesia was then administered and tolerated well. After the induction, the abdomen was prepped with Chloraprep and draped in the sterile fashion. The patient was positioned in the supine position.  Local anesthetic agent was injected into the skin near the umbilicus and an incision made. We dissected down to the abdominal fascia with blunt dissection.  The fascia  was incised vertically and we entered the peritoneal cavity bluntly.  A pursestring suture of 0-Vicryl was placed around the fascial opening.  The Hasson cannula was inserted and secured with the stay suture.  Pneumoperitoneum was then created with CO2 and tolerated well without any adverse changes in the patient's vital signs. An 5-mm port was placed in the subxiphoid position.  Two 5-mm ports were placed in the right upper quadrant. All skin incisions were infiltrated with a local anesthetic agent before making the incision and placing the trocars.   We positioned the patient in reverse Trendelenburg, tilted slightly to the patient's left.  The gallbladder was identified, the fundus grasped and retracted cephalad. Adhesions were lysed bluntly and with the electrocautery where indicated, taking care not to injure any adjacent organs or viscus. The infundibulum was grasped and retracted laterally, exposing the peritoneum overlying the triangle of Calot. This was then divided and exposed in a blunt fashion. A critical view of the cystic duct and cystic artery was obtained.  The cystic duct was clearly identified and bluntly dissected circumferentially. The cystic duct was ligated with a clip distally.   An incision was made in the cystic duct and the [facility] cholangiogram catheter introduced. The catheter was secured using a clip. A cholangiogram was then obtained which showed good visualization of the distal and proximal biliary tree with no sign of filling defects or obstruction.  The duct appeared slightly dilated; however, there was no filling defect. Contrast flowed easily into the duodenum. The catheter was then removed.   The cystic duct was then ligated with clips and divided. The cystic artery was identified, dissected free, ligated with clips and divided as well.   The gallbladder was dissected from the liver bed in retrograde fashion with the electrocautery. We came a posterior branch of the cystic  artery which was clipped with 2 clips.The gallbladder was  removed and placed in an Endocatch sac. The gallbladder and Endocatch sac were then removed through the umbilical port site.  Due to size of the gallbladder, I had to enlarge the fascial defect.  The pursestring suture was cut. The gallbladder and specimen bag were then easily extracted. A new 0-vicryl pursestring suture was placed. Pneumoperitoneum was re-established. The liver bed was irrigated and inspected. Hemostasis was achieved with the electrocautery. Copious irrigation was utilized and was repeatedly aspirated until clear.  The Hasson trocar was removed and the previously pursestring suture at the umbilicus was used to close the umbilical fascia.    We again inspected the right upper quadrant for hemostasis.  The umbilical closure was inspected and there was no air leak and nothing within the closure. Pneumoperitoneum was released as we removed the trocars.  4-0 Monocryl was used to close the skin.   Dermabond was then applied. The patient was then extubated and brought to the recovery room in stable condition. Instrument, sponge, and needle counts were correct at closure and at the conclusion of the case.   Findings: Large gallbladder with  Cholelithiasis  Estimated Blood Loss: less than 50 mL         Drains: none         Specimens: Gallbladder           Complications: None; patient tolerated the procedure well.         Disposition: PACU - hemodynamically stable.         Condition: stable  [name]. Andrey Campanile, MD, FACS General, Bariatric, & Minimally Invasive Surgery [facility], [place]

## 2011-05-10 NOTE — Transfer of Care (Signed)
Immediate Anesthesia Transfer of Care Note  Patient: [name]  Procedure(s) Performed: Procedure(s) (LRB): LAPAROSCOPIC CHOLECYSTECTOMY WITH INTRAOPERATIVE CHOLANGIOGRAM (N/A)  Patient Location: PACU  Anesthesia Type: General  Level of Consciousness: awake, alert , oriented and pateint uncooperative  Airway & Oxygen Therapy: Patient Spontanous Breathing and Patient connected to face mask oxygen  Post-op Assessment: Report given to PACU RN and Post -op Vital signs reviewed and stable  Post vital signs: Reviewed and stable  Complications: No apparent anesthesia complications

## 2011-05-10 NOTE — Preoperative (Signed)
Beta Blockers   Reason not to administer Beta Blockers:Not Applicable 

## 2011-05-10 NOTE — Discharge Instructions (Signed)
[facility] °LAPAROSCOPIC SURGERY: POST OP INSTRUCTIONS °Always review your discharge instruction sheet given to you by the facility where your surgery was performed. °IF YOU HAVE DISABILITY OR FAMILY LEAVE FORMS, YOU MUST BRING THEM TO THE OFFICE FOR PROCESSING.   °DO NOT GIVE THEM TO YOUR DOCTOR. ° °1. A prescription for pain medication may be given to you upon discharge.  Take your pain medication as prescribed, if needed.  If narcotic pain medicine is not needed, then you may take acetaminophen (Tylenol) or ibuprofen (Advil) as needed. °2. Take your usually prescribed medications unless otherwise directed. °3. If you need a refill on your pain medication, please contact your pharmacy.  They will contact our office to request authorization. Prescriptions will not be filled after 5pm or on week-ends. °4. You should follow a light diet the first few days after arrival home, such as soup and crackers, etc.  Be sure to include lots of fluids daily. °5. Most patients will experience some swelling and bruising in the area of the incisions.  Ice packs will help.  Swelling and bruising can take several days to resolve.  °6. It is common to experience some constipation if taking pain medication after surgery.  Increasing fluid intake and taking a stool softener (such as Colace) will usually help or prevent this problem from occurring.  A mild laxative (Milk of Magnesia or Miralax) should be taken according to package instructions if there are no bowel movements after 48 hours. °7. If your surgeon used skin glue on the incision, you may shower in 24 hours.  The glue will flake off over the next 2-3 weeks.  Any sutures or staples will be removed at the office during your follow-up visit. °8. ACTIVITIES:  You may resume regular (light) daily activities beginning the next day--such as daily self-care, walking, climbing stairs--gradually increasing activities as tolerated.  You may have sexual  intercourse when it is comfortable.  Refrain from any heavy lifting or straining until approved by your doctor. °a. You may drive when you are no longer taking prescription pain medication, you can comfortably wear a seatbelt, and you can safely maneuver your car and apply brakes. °9. You should see your doctor in the office for a follow-up appointment approximately 2-3 weeks after your surgery.  Make sure that you call for this appointment within a day or two after you arrive home to insure a convenient appointment time. °10. OTHER INSTRUCTIONS:  °WHEN TO CALL YOUR DOCTOR: °1. Fever over 101.0 °2. Inability to urinate °3. Continued bleeding from incision. °4. Increased pain, redness, or drainage from the incision. °5. Increasing abdominal pain ° °The clinic staff is available to answer your questions during regular business hours.  Please don’t hesitate to call and ask to speak to one of the nurses for clinical concerns.  If you have a medical emergency, go to the nearest emergency room or call 911.  A surgeon from [facility] is always on call at the hospital. °[address], [address], [place], [place]  [address] ? [address] [address], [place], [place]   [address] °[phone] ? [phone] ? FAX [phone] °Web site: [email or web address] ° °

## 2011-05-10 NOTE — Progress Notes (Signed)
Patient arrived to unit extremely upset, yelling and screaming, insisting that she cannot stay here. Yelling about noone being able to watch her children. Unable to calm patient, charge nurse intervened. Doctor wilson was notified. Received order to discharge patient. Instructions and prescription given to patient. Continues to be upset . After several minutes of talking with patient she began to calm down. Encouraged her to take it slowly while she is healing. Was able to ambulate and dress her self. Left unit in wheelchair accompanied by staff. Was calm at the time of leaving unit.

## 2011-05-10 NOTE — Anesthesia Postprocedure Evaluation (Signed)
  Anesthesia Post-op Note  Patient: [name]  Procedure(s) Performed: Procedure(s) (LRB): LAPAROSCOPIC CHOLECYSTECTOMY WITH INTRAOPERATIVE CHOLANGIOGRAM (N/A)  Patient Location: PACU  Anesthesia Type: General  Level of Consciousness: awake and alert   Airway and Oxygen Therapy: Patient Spontanous Breathing  Post-op Pain: mild  Post-op Assessment: Post-op Vital signs reviewed, Patient's Cardiovascular Status Stable, Respiratory Function Stable, Patent Airway and No signs of Nausea or vomiting  Post-op Vital Signs: stable  Complications: No apparent anesthesia complications

## 2011-05-10 NOTE — Anesthesia Preprocedure Evaluation (Signed)
Anesthesia Evaluation  Patient identified by MRN, date of birth, ID band Patient awake    Reviewed: Allergy & Precautions, H&P , NPO status , Patient's Chart, lab work & pertinent test results  Airway Mallampati: II TM Distance: >3 FB Neck ROM: full    Dental No notable dental hx.    Pulmonary neg pulmonary ROS, asthma ,  clear to auscultation  Pulmonary exam normal       Cardiovascular Exercise Tolerance: Good neg cardio ROS regular Normal    Neuro/Psych Negative Neurological ROS  Negative Psych ROS   GI/Hepatic negative GI ROS, Neg liver ROS, GERD-  Medicated and Controlled,  Endo/Other  Negative Endocrine ROS  Renal/GU negative Renal ROS  Genitourinary negative   Musculoskeletal   Abdominal   Peds  Hematology negative hematology ROS (+)   Anesthesia Other Findings   Reproductive/Obstetrics negative OB ROS                           Anesthesia Physical Anesthesia Plan  ASA: II  Anesthesia Plan: General   Post-op Pain Management:    Induction: Intravenous  Airway Management Planned: Oral ETT  Additional Equipment:   Intra-op Plan:   Post-operative Plan: Extubation in OR  Informed Consent: I have reviewed the patients History and Physical, chart, labs and discussed the procedure including the risks, benefits and alternatives for the proposed anesthesia with the patient or authorized representative who has indicated his/her understanding and acceptance.   Dental Advisory Given  Plan Discussed with: CRNA and Surgeon  Anesthesia Plan Comments:         Anesthesia Quick Evaluation

## 2011-05-10 NOTE — Interval H&P Note (Signed)
History and Physical Interval Note:  05/10/2011 10:48 AM  [name]  has presented today for surgery, with the diagnosis of symptomatic cholelithiasis  The various methods of treatment have been discussed with the patient and family. After consideration of risks, benefits and other options for treatment, the patient has consented to  Procedure(s) (LRB): LAPAROSCOPIC CHOLECYSTECTOMY WITH INTRAOPERATIVE CHOLANGIOGRAM (N/A) as a surgical intervention .  The patients' history has been reviewed, patient examined, no change in status, stable for surgery.  I have reviewed the patients' chart and labs.  Questions were answered to the patient's satisfaction.    [name]. Andrey Campanile, MD, FACS General, Bariatric, & Minimally Invasive Surgery [facility], [place]   [facility]

## 2011-05-16 ENCOUNTER — Other Ambulatory Visit (INDEPENDENT_AMBULATORY_CARE_PROVIDER_SITE_OTHER): Payer: Self-pay

## 2011-05-16 DIAGNOSIS — G8918 Other acute postprocedural pain: Secondary | ICD-10-CM

## 2011-05-16 MED ORDER — HYDROCODONE-ACETAMINOPHEN 5-325 MG PO TABS: 1.0000 | ORAL_TABLET | ORAL | Status: AC | PRN

## 2011-05-28 ENCOUNTER — Encounter (HOSPITAL_COMMUNITY): Payer: Self-pay | Admitting: General Surgery

## 2011-05-31 ENCOUNTER — Ambulatory Visit (INDEPENDENT_AMBULATORY_CARE_PROVIDER_SITE_OTHER): Payer: Medicaid Other | Admitting: General Surgery

## 2011-05-31 ENCOUNTER — Encounter (INDEPENDENT_AMBULATORY_CARE_PROVIDER_SITE_OTHER): Payer: Self-pay | Admitting: General Surgery

## 2011-05-31 VITALS — BP 110/74 | HR 68 | Temp 97.3°F | Resp 12 | Ht 63.0 in | Wt 212.8 lb

## 2011-05-31 DIAGNOSIS — Z09 Encounter for follow-up examination after completed treatment for conditions other than malignant neoplasm: Secondary | ICD-10-CM

## 2011-05-31 NOTE — Patient Instructions (Signed)
Stop taking the colace/stool softner

## 2011-05-31 NOTE — Progress Notes (Signed)
Chief complaint: Postop  Procedure: Status post laparoscopic cholecystectomy with cholangiogram May 10, 2011  History of Present Ilness: 26 year old Caucasian female comes in for followup appointment. She states that she is no longer having any of the epigastric/right upper quadrant/left upper quadrant pain that she had preoperatively. She denies any nausea. She denies any fevers or chills. She denies any constipation. She is having bowel movements every day. She states that she is now having a problem with cereal with milk, ice cream, or cheese products. If she eats these products, she will have some burping and belching and loose stool. She is still taking a stool softener.  Physical Exam: BP 110/74  Pulse 68  Temp(Src) 97.3 F (36.3 C) (Temporal)  Resp 12  Ht 5\' 3"  (1.6 m)  Wt 212 lb 12.8 oz (96.525 kg)  BMI 37.70 kg/m2  Gen: alert, NAD, non-toxic appearing Pupils: equal, no scleral icterus Pulm: Lungs clear to auscultation, symmetric chest rise CV: regular rate and rhythm Abd: soft, nontender, nondistended. Well-healed trocar sites. No cellulitis. No incisional hernia Ext: no edema, no calf tenderness Skin: no rash, no jaundice   Pathology: Chronic cholecystitis with cholelithiasis  Assessment and Plan: Status post laparoscopic cholecystectomy with cholangiogram-overall doing well.  I advised her to stop taking a stool softener. I encouraged her to eat a high fiber diet. I will give her a few more weeks to see how she does before prescribing Questran.  We discussed her pathology report.  Followup 6 weeks  [name]. Andrey Campanile, MD, FACS General, Bariatric, & Minimally Invasive Surgery [facility], [place]

## 2011-07-12 ENCOUNTER — Encounter (INDEPENDENT_AMBULATORY_CARE_PROVIDER_SITE_OTHER): Payer: Medicaid Other | Admitting: General Surgery

## 2011-07-16 ENCOUNTER — Encounter (INDEPENDENT_AMBULATORY_CARE_PROVIDER_SITE_OTHER): Payer: Medicaid Other | Admitting: General Surgery

## 2011-07-25 ENCOUNTER — Encounter (INDEPENDENT_AMBULATORY_CARE_PROVIDER_SITE_OTHER): Payer: Self-pay | Admitting: General Surgery

## 2012-03-17 ENCOUNTER — Encounter (HOSPITAL_COMMUNITY): Payer: Self-pay | Admitting: *Deleted

## 2012-03-17 ENCOUNTER — Emergency Department (HOSPITAL_COMMUNITY)
Admission: EM | Admit: 2012-03-17 | Discharge: 2012-03-17 | Disposition: A | Discharge status: Home / Self Care | Payer: Medicaid Other | Attending: Emergency Medicine | Admitting: Emergency Medicine

## 2012-03-17 DIAGNOSIS — K0889 Other specified disorders of teeth and supporting structures: Secondary | ICD-10-CM

## 2012-03-17 DIAGNOSIS — Z8669 Personal history of other diseases of the nervous system and sense organs: Secondary | ICD-10-CM | POA: Insufficient documentation

## 2012-03-17 DIAGNOSIS — E669 Obesity, unspecified: Secondary | ICD-10-CM | POA: Insufficient documentation

## 2012-03-17 DIAGNOSIS — Z8659 Personal history of other mental and behavioral disorders: Secondary | ICD-10-CM | POA: Insufficient documentation

## 2012-03-17 DIAGNOSIS — K089 Disorder of teeth and supporting structures, unspecified: Secondary | ICD-10-CM | POA: Insufficient documentation

## 2012-03-17 DIAGNOSIS — Z8742 Personal history of other diseases of the female genital tract: Secondary | ICD-10-CM | POA: Insufficient documentation

## 2012-03-17 DIAGNOSIS — M4120 Other idiopathic scoliosis, site unspecified: Secondary | ICD-10-CM | POA: Insufficient documentation

## 2012-03-17 DIAGNOSIS — H9209 Otalgia, unspecified ear: Secondary | ICD-10-CM | POA: Insufficient documentation

## 2012-03-17 DIAGNOSIS — Z8719 Personal history of other diseases of the digestive system: Secondary | ICD-10-CM | POA: Insufficient documentation

## 2012-03-17 DIAGNOSIS — R6884 Jaw pain: Secondary | ICD-10-CM | POA: Insufficient documentation

## 2012-03-17 DIAGNOSIS — Z79899 Other long term (current) drug therapy: Secondary | ICD-10-CM | POA: Insufficient documentation

## 2012-03-17 DIAGNOSIS — F17200 Nicotine dependence, unspecified, uncomplicated: Secondary | ICD-10-CM | POA: Insufficient documentation

## 2012-03-17 DIAGNOSIS — J45909 Unspecified asthma, uncomplicated: Secondary | ICD-10-CM | POA: Insufficient documentation

## 2012-03-17 DIAGNOSIS — Z9889 Other specified postprocedural states: Secondary | ICD-10-CM | POA: Insufficient documentation

## 2012-03-17 DIAGNOSIS — Z8739 Personal history of other diseases of the musculoskeletal system and connective tissue: Secondary | ICD-10-CM | POA: Insufficient documentation

## 2012-03-17 DIAGNOSIS — R109 Unspecified abdominal pain: Secondary | ICD-10-CM | POA: Insufficient documentation

## 2012-03-17 MED ORDER — HYDROCODONE-ACETAMINOPHEN 5-325 MG PO TABS
2.0000 | ORAL_TABLET | Freq: Once | ORAL | Status: AC
  Administered 2012-03-17: 2 via ORAL
  Filled 2012-03-17: qty 2

## 2012-03-17 MED ORDER — HYDROCODONE-ACETAMINOPHEN 5-325 MG PO TABS: 2.0000 | ORAL_TABLET | ORAL | Status: DC | PRN

## 2012-03-17 MED ORDER — PENICILLIN V POTASSIUM 500 MG PO TABS: 500.0000 mg | ORAL_TABLET | Freq: Three times a day (TID) | ORAL | Status: DC

## 2012-03-17 NOTE — ED Provider Notes (Signed)
History     CSN: [id number]  Arrival date & time 03/17/12  0222   First MD Initiated Contact with Patient 03/17/12 [phone]      Chief Complaint  Patient presents with  . Dental Pain  . Abdominal Pain  . Otalgia    (Consider location/radiation/quality/duration/timing/severity/associated sxs/prior treatment) HPI Comments: The patient is a 26 year old otherwise healthy female who presents with dental pain that started gradually one weekago. The dental pain is severe, constant and progressively worsening. The pain is aching and located in left lower jaw. The pain does not radiate. Eating makes the pain worse. Nothing makes the pain better. The patient has not tried anything for pain. Associated symptoms include left ear pain. Patient denies headache, neck pain/stiffness, fever, NVD, edema, sore throat, throat swelling, wheezing, SOB, chest pain, abdominal pain.     Patient is a 26 y.o. female presenting with tooth pain, abdominal pain, and ear pain.  Dental Pain Additional symptoms include: ear pain.   Abdominal Pain The primary symptoms of the illness include abdominal pain.  Otalgia Associated symptoms include abdominal pain.    Past Medical History  Diagnosis Date  . Scoliosis   . Obesity   . Migraine   . Anxiety   . Gallstones   . Asthma   . Arthritis   . Pelvic inflammatory disease (PID) 05-08-11    previous hx. .Hx. childbirth x3-NVD    Past Surgical History  Procedure Date  . Abdominal exploration surgery approx 26 years old    rlq(due to adhesions around ovaries)-appendix and ovaries remain  . Childbirth   . Cholecystectomy 05/10/2011    Procedure: LAPAROSCOPIC CHOLECYSTECTOMY WITH INTRAOPERATIVE CHOLANGIOGRAM;  Surgeon: Atilano Ina, MD,FACS;  Location: [facility];  Service: General;  Laterality: N/A;    Family History  Problem Relation Age of Onset  . Prostate cancer Maternal Grandfather   . Cancer Maternal Grandfather     prostate  . Colon polyps Maternal  Grandmother   . Diabetes Mother   . Endometriosis Mother   . Cancer Mother     lung  . Diabetes Father   . Heart disease Father   . Cirrhosis Paternal Grandfather     History  Substance Use Topics  . Smoking status: Current Every Day Smoker -- 0.5 packs/day for 12 years    Types: Cigarettes  . Smokeless tobacco: Never Used  . Alcohol Use: Yes     Comment: rarely-social    OB History    Grav Para Term Preterm Abortions TAB SAB Ect Mult Living                  Review of Systems  HENT: Positive for ear pain and dental problem.   Gastrointestinal: Positive for abdominal pain.  All other systems reviewed and are negative.    Allergies  Latex  Home Medications   Current Outpatient Rx  Name  Route  Sig  Dispense  Refill  . ALBUTEROL SULFATE HFA 108 (90 BASE) MCG/ACT IN AERS   Inhalation   Inhale 2 puffs into the lungs every 6 (six) hours as needed. Wheezing          . CYCLOBENZAPRINE HCL 10 MG PO TABS   Oral   Take 10 mg by mouth 3 (three) times daily as needed. For back spasms         . MEDROXYPROGESTERONE ACETATE 150 MG/ML IM SUSP   Intramuscular   Inject 150 mg into the muscle every 3 (three) months.         Marland Kitchen  OMEPRAZOLE MAGNESIUM 20 MG PO TBEC   Oral   Take 20 mg by mouth 2 (two) times daily.         Marland Kitchen PREDNISONE (PAK) 10 MG PO TABS   Oral   Take 10 mg by mouth daily.         . SULFAMETHOXAZOLE-TMP DS 800-160 MG PO TABS   Oral   Take 1 tablet by mouth 2 (two) times daily.           BP 112/61  Temp 98 F (36.7 C) (Oral)  Resp 26  SpO2 97%  LMP 03/17/2012  Physical Exam  Nursing note and vitals reviewed. Constitutional: She is oriented to person, place, and time. She appears well-developed and well-nourished. No distress.  HENT:  Head: Normocephalic and atraumatic.  Right Ear: External ear normal.  Left Ear: External ear normal.       Poor dentition. Lower jaw molars tender to percussion. Left mastoid process tender to palpation.  Patient endorses pain with retraction of left auricle.   Eyes: Conjunctivae normal are normal.  Neck: Normal range of motion. Neck supple.  Cardiovascular: Normal rate and regular rhythm.  Exam reveals no gallop and no friction rub.   No murmur heard. Pulmonary/Chest: Effort normal and breath sounds normal. She has no wheezes. She has no rales. She exhibits no tenderness.  Abdominal: Soft. There is no tenderness.  Musculoskeletal: Normal range of motion.  Neurological: She is alert and oriented to person, place, and time. Coordination normal.       Speech is goal-oriented. Moves limbs without ataxia.   Skin: Skin is warm and dry. She is not diaphoretic.  Psychiatric: She has a normal mood and affect. Her behavior is normal.    ED Course  Procedures (including critical care time)  Labs Reviewed - No data to display No results found.   1. Pain, dental       MDM  6:38 AM Patient will have Vicodin for dental pain. Patient has an appointment with her dentist next week. I will discharge her with vicodin and pen VK. No further evaluation needed at this time.        Emilia Beck, [place] 03/17/12 [phone]

## 2012-03-17 NOTE — ED Notes (Addendum)
C/o L lower toothache, radiating to L ear, reports cavities, recent R upper tooth extraction not healing, dentist cannot see her anytime soon, no relief with listerine, sweet oil or anbesol, also had a cervical biopsy and is having related pelvic pain.

## 2012-03-17 NOTE — ED Notes (Signed)
Patient tearful Pain to mouth, left ear and lower abd area.  Had tooth pulled to her right lower mouth and stated that is painful, had tooth pulled on her left lower area and now her left ear is hurting, also had a cervical biopsy and that is causing her pain

## 2012-03-17 NOTE — ED Provider Notes (Signed)
MSE was initiated and I personally evaluated the patient and placed orders (if any) at  5:00 AM on March 17, 2012.  The patient appears stable so that the remainder of the MSE may be completed by another provider.   Pt is in no distress, is sound asleep.  She will rouse, but will fall back asleep and I cannot get her to speak to me.  Will allow her to continue to sleep and can see next provider at 0600.    Gavin Pound. Oletta Lamas, MD 03/17/12 [phone]

## 2012-03-17 NOTE — ED Provider Notes (Signed)
Medical screening examination/treatment/procedure(s) were conducted as a shared visit with non-physician practitioner(s) and myself.  I personally evaluated the patient during the encounter  Pt seen again by me as well as PAC.  NO distress.  Pt primarily complains of severe dental pain but several times, pt could barely be aroused to complete a reasonable history and physical exam.    Gavin Pound. Oletta Lamas, MD 03/17/12 [phone]

## 2012-03-19 ENCOUNTER — Encounter (HOSPITAL_COMMUNITY): Payer: Self-pay | Admitting: *Deleted

## 2012-03-19 ENCOUNTER — Emergency Department (HOSPITAL_COMMUNITY)
Admission: EM | Admit: 2012-03-19 | Discharge: 2012-03-19 | Disposition: A | Discharge status: Home / Self Care | Payer: Medicaid Other | Attending: Emergency Medicine | Admitting: Emergency Medicine

## 2012-03-19 DIAGNOSIS — K089 Disorder of teeth and supporting structures, unspecified: Secondary | ICD-10-CM | POA: Insufficient documentation

## 2012-03-19 DIAGNOSIS — H60399 Other infective otitis externa, unspecified ear: Secondary | ICD-10-CM | POA: Insufficient documentation

## 2012-03-19 DIAGNOSIS — M4120 Other idiopathic scoliosis, site unspecified: Secondary | ICD-10-CM | POA: Insufficient documentation

## 2012-03-19 DIAGNOSIS — Z8739 Personal history of other diseases of the musculoskeletal system and connective tissue: Secondary | ICD-10-CM | POA: Insufficient documentation

## 2012-03-19 DIAGNOSIS — Z8719 Personal history of other diseases of the digestive system: Secondary | ICD-10-CM | POA: Insufficient documentation

## 2012-03-19 DIAGNOSIS — J45909 Unspecified asthma, uncomplicated: Secondary | ICD-10-CM | POA: Insufficient documentation

## 2012-03-19 DIAGNOSIS — F17200 Nicotine dependence, unspecified, uncomplicated: Secondary | ICD-10-CM | POA: Insufficient documentation

## 2012-03-19 DIAGNOSIS — H6090 Unspecified otitis externa, unspecified ear: Secondary | ICD-10-CM

## 2012-03-19 DIAGNOSIS — Z79899 Other long term (current) drug therapy: Secondary | ICD-10-CM | POA: Insufficient documentation

## 2012-03-19 DIAGNOSIS — E669 Obesity, unspecified: Secondary | ICD-10-CM | POA: Insufficient documentation

## 2012-03-19 DIAGNOSIS — K0889 Other specified disorders of teeth and supporting structures: Secondary | ICD-10-CM

## 2012-03-19 DIAGNOSIS — Z8659 Personal history of other mental and behavioral disorders: Secondary | ICD-10-CM | POA: Insufficient documentation

## 2012-03-19 DIAGNOSIS — Z8742 Personal history of other diseases of the female genital tract: Secondary | ICD-10-CM | POA: Insufficient documentation

## 2012-03-19 DIAGNOSIS — Z8679 Personal history of other diseases of the circulatory system: Secondary | ICD-10-CM | POA: Insufficient documentation

## 2012-03-19 MED ORDER — CIPROFLOXACIN-DEXAMETHASONE 0.3-0.1 % OT SUSP
4.0000 [drp] | Freq: Once | OTIC | Status: AC
  Administered 2012-03-19: 4 [drp] via OTIC
  Filled 2012-03-19: qty 7.5

## 2012-03-19 MED ORDER — TRAMADOL HCL 50 MG PO TABS: 50.0000 mg | ORAL_TABLET | Freq: Four times a day (QID) | ORAL | Status: DC | PRN

## 2012-03-19 MED ORDER — HYDROCODONE-ACETAMINOPHEN 5-325 MG PO TABS
1.0000 | ORAL_TABLET | Freq: Once | ORAL | Status: AC
  Administered 2012-03-19: 1 via ORAL
  Filled 2012-03-19: qty 1

## 2012-03-19 NOTE — ED Notes (Signed)
Pt states [facility] was [facility] for same issues of toothache and ear pain,  Pt states she has taken all of her vicodin and the pcn is not helping,

## 2012-03-19 NOTE — ED Provider Notes (Signed)
History     CSN: [id number]  Arrival date & time 03/19/12  3086   First MD Initiated Contact with Patient 03/19/12 0444      Chief Complaint  Patient presents with  . Dental Pain  . Otalgia   HPI  History provided by the patient. Patient is a 26 year old female with history of asthma, migraines, anxiety who presents with complaints of continued left upper molar dental pain and new onset of left ear pains. Patient reports having increasing tooth pain for the past several days. She was seen 2 days to emergency room for similar complaints. She was given prescriptions for penicillin and Vicodin which she has been using. Patient states she ran out of her Vicodin medications and pain continues. She states she will not go see her dentist until this coming Monday. Patient denies having any associated fever, chills or sweats. Denies any swelling of the mouth, difficulty swallowing or difficulty breathing. Denies any nausea vomiting symptoms. Over the past 2 days patient also began to have some left ear pain. She was using tissue paper covered in sweet oil to stuff into her ear and pain symptoms have worsened. Pain is now worse with any movements or pressure of her ear. She has not used any other treatments for ear pain symptoms. Denies any hearing changes. Denies any dizziness or vertigo.    Past Medical History  Diagnosis Date  . Scoliosis   . Obesity   . Migraine   . Anxiety   . Gallstones   . Asthma   . Arthritis   . Pelvic inflammatory disease (PID) 05-08-11    previous hx. .Hx. childbirth x3-NVD    Past Surgical History  Procedure Date  . Abdominal exploration surgery approx 26 years old    rlq(due to adhesions around ovaries)-appendix and ovaries remain  . Childbirth   . Cholecystectomy 05/10/2011    Procedure: LAPAROSCOPIC CHOLECYSTECTOMY WITH INTRAOPERATIVE CHOLANGIOGRAM;  Surgeon: Atilano Ina, MD,FACS;  Location: [facility];  Service: General;  Laterality: N/A;    Family History   Problem Relation Age of Onset  . Prostate cancer Maternal Grandfather   . Cancer Maternal Grandfather     prostate  . Colon polyps Maternal Grandmother   . Diabetes Mother   . Endometriosis Mother   . Cancer Mother     lung  . Diabetes Father   . Heart disease Father   . Cirrhosis Paternal Grandfather     History  Substance Use Topics  . Smoking status: Current Every Day Smoker -- 0.5 packs/day for 12 years    Types: Cigarettes  . Smokeless tobacco: Never Used  . Alcohol Use: Yes     Comment: rarely-social    OB History    Grav Para Term Preterm Abortions TAB SAB Ect Mult Living                  Review of Systems  Constitutional: Negative for fever and chills.  HENT: Positive for ear pain and dental problem. Negative for hearing loss, sore throat, trouble swallowing, voice change and tinnitus.   Respiratory: Negative for shortness of breath.   All other systems reviewed and are negative.    Allergies  Latex  Home Medications   Current Outpatient Rx  Name  Route  Sig  Dispense  Refill  . ALBUTEROL SULFATE HFA 108 (90 BASE) MCG/ACT IN AERS   Inhalation   Inhale 2 puffs into the lungs every 6 (six) hours as needed. Wheezing          .  HYDROCODONE-ACETAMINOPHEN 5-325 MG PO TABS   Oral   Take 2 tablets by mouth every 4 (four) hours as needed for pain.   6 tablet   0   . MEDROXYPROGESTERONE ACETATE 150 MG/ML IM SUSP   Intramuscular   Inject 150 mg into the muscle every 3 (three) months.         Marland Kitchen PENICILLIN V POTASSIUM 500 MG PO TABS   Oral   Take 1 tablet (500 mg total) by mouth 3 (three) times daily.   30 tablet   0   . CYCLOBENZAPRINE HCL 10 MG PO TABS   Oral   Take 10 mg by mouth 3 (three) times daily as needed. For back spasms         . OMEPRAZOLE MAGNESIUM 20 MG PO TBEC   Oral   Take 20 mg by mouth 2 (two) times daily as needed. For heartburn           BP 116/70  Pulse 120  Temp 97.1 F (36.2 C) (Oral)  Resp 22  Ht 5\' 3"  (1.6  m)  Wt 234 lb (106.142 kg)  BMI 41.45 kg/m2  SpO2 96%  LMP 03/17/2012  Physical Exam  Nursing note and vitals reviewed. Constitutional: She is oriented to person, place, and time. She appears well-developed and well-nourished. No distress.  HENT:  Head: Normocephalic.  Right Ear: Tympanic membrane normal.  Mouth/Throat:         Several teeth have been extracted throughout. There is several dental fillings. Patient reports tenderness to percussion over left second premolar and first molar. There is no swelling of the adjacent comes. Dentition otherwise unremarkable.   Patient has pain with manipulation of left pinna and tragus. There is mild swelling of the external auditory canal with erythema extending to the TM. No significant drainage.  Cardiovascular: Normal rate and regular rhythm.   No murmur heard. Pulmonary/Chest: Effort normal and breath sounds normal. No respiratory distress. She has no wheezes. She has no rales.  Neurological: She is alert and oriented to person, place, and time.  Skin: Skin is warm and dry.  Psychiatric: She has a normal mood and affect. Her behavior is normal.    ED Course  Procedures       1. Pain, dental   2. Otitis externa       MDM  4:05 AM patient seen and evaluated. Patient asleep at this time does not appear in any acute distress or significant discomfort.  Patient was seen and evaluated for similar symptoms was given a prescription for 6 Vicodin which she has used in the last 2 days. Patient reports continuing penicillin.        Angus Seller, [place] 03/19/12 2022

## 2012-03-25 NOTE — ED Provider Notes (Signed)
Medical screening examination/treatment/procedure(s) were performed by non-physician practitioner and as supervising physician I was immediately available for consultation/collaboration. Avigayil Ton, MD, FACEP   Kianah Harries L Galen Malkowski, MD 03/25/12 2146 

## 2012-05-14 ENCOUNTER — Emergency Department (HOSPITAL_COMMUNITY)
Admission: EM | Admit: 2012-05-14 | Discharge: 2012-05-14 | Disposition: A | Discharge status: Home / Self Care | Payer: Medicaid Other | Attending: Emergency Medicine | Admitting: Emergency Medicine

## 2012-05-14 ENCOUNTER — Encounter (HOSPITAL_COMMUNITY): Payer: Self-pay | Admitting: Emergency Medicine

## 2012-05-14 DIAGNOSIS — H9209 Otalgia, unspecified ear: Secondary | ICD-10-CM | POA: Insufficient documentation

## 2012-05-14 DIAGNOSIS — H60399 Other infective otitis externa, unspecified ear: Secondary | ICD-10-CM | POA: Insufficient documentation

## 2012-05-14 DIAGNOSIS — K029 Dental caries, unspecified: Secondary | ICD-10-CM | POA: Insufficient documentation

## 2012-05-14 DIAGNOSIS — Z8719 Personal history of other diseases of the digestive system: Secondary | ICD-10-CM | POA: Insufficient documentation

## 2012-05-14 DIAGNOSIS — J45909 Unspecified asthma, uncomplicated: Secondary | ICD-10-CM | POA: Insufficient documentation

## 2012-05-14 DIAGNOSIS — Z8659 Personal history of other mental and behavioral disorders: Secondary | ICD-10-CM | POA: Insufficient documentation

## 2012-05-14 DIAGNOSIS — Z8679 Personal history of other diseases of the circulatory system: Secondary | ICD-10-CM | POA: Insufficient documentation

## 2012-05-14 DIAGNOSIS — Z8742 Personal history of other diseases of the female genital tract: Secondary | ICD-10-CM | POA: Insufficient documentation

## 2012-05-14 DIAGNOSIS — M4120 Other idiopathic scoliosis, site unspecified: Secondary | ICD-10-CM | POA: Insufficient documentation

## 2012-05-14 DIAGNOSIS — F17200 Nicotine dependence, unspecified, uncomplicated: Secondary | ICD-10-CM | POA: Insufficient documentation

## 2012-05-14 DIAGNOSIS — Z8739 Personal history of other diseases of the musculoskeletal system and connective tissue: Secondary | ICD-10-CM | POA: Insufficient documentation

## 2012-05-14 DIAGNOSIS — K044 Acute apical periodontitis of pulpal origin: Secondary | ICD-10-CM | POA: Insufficient documentation

## 2012-05-14 DIAGNOSIS — E669 Obesity, unspecified: Secondary | ICD-10-CM | POA: Insufficient documentation

## 2012-05-14 DIAGNOSIS — Z79899 Other long term (current) drug therapy: Secondary | ICD-10-CM | POA: Insufficient documentation

## 2012-05-14 MED ORDER — ACETAMINOPHEN-CODEINE #3 300-30 MG PO TABS: 1.0000 | ORAL_TABLET | Freq: Four times a day (QID) | ORAL | Status: DC | PRN

## 2012-05-14 MED ORDER — CLINDAMYCIN HCL 300 MG PO CAPS: 300.0000 mg | ORAL_CAPSULE | Freq: Four times a day (QID) | ORAL | Status: DC

## 2012-05-14 MED ORDER — CIPROFLOXACIN-DEXAMETHASONE 0.3-0.1 % OT SUSP
4.0000 [drp] | Freq: Once | OTIC | Status: AC
  Administered 2012-05-14: 4 [drp] via OTIC
  Filled 2012-05-14: qty 7.5

## 2012-05-14 NOTE — ED Notes (Signed)
Pt reports l/ear pain and l/facial-dental pain x 1 week. Denies fever

## 2012-05-14 NOTE — ED Provider Notes (Signed)
History    This chart was scribed for Johnnette Gourd, PA-C, non-physician practitioner working with Laray Anger, DO by Charolett Bumpers, ED Scribe. This patient was seen in room WTR6/WTR6 and the patient's care was started at 1623.    CSN: [id number]  Arrival date & time 05/14/12  1609   First MD Initiated Contact with Patient 05/14/12 1623      Chief Complaint  Patient presents with  . Dental Pain    l/lower jaw pain x 1 week  . Otalgia    l/ear pain x 1 week    The history is provided by the patient. No language interpreter was used.   [name] is a 27 y.o. female who presents to the Emergency Department complaining of gradually worsening, constant, severe left lower molar pain for the past week. She was recently treated for the same dental pain in the ED and states that she recently stopped taking penicillin last week prior to the worsening of the pain. She rates her dental pain 7-8/10 and states that she has taken Motrin without relief. She also reports left ear pain in which she has been self treating with sweet oil. She denies any fevers, chills or trouble swallowing. She did see her dentist who will not remove her tooth due to owing money for a prior root canal.   Past Medical History  Diagnosis Date  . Scoliosis   . Obesity   . Migraine   . Anxiety   . Gallstones   . Asthma   . Arthritis   . Pelvic inflammatory disease (PID) 05-08-11    previous hx. .Hx. childbirth x3-NVD    Past Surgical History  Procedure Laterality Date  . Abdominal exploration surgery  approx 27 years old    rlq(due to adhesions around ovaries)-appendix and ovaries remain  . Childbirth    . Cholecystectomy  05/10/2011    Procedure: LAPAROSCOPIC CHOLECYSTECTOMY WITH INTRAOPERATIVE CHOLANGIOGRAM;  Surgeon: Atilano Ina, MD,FACS;  Location: [facility];  Service: General;  Laterality: N/A;    Family History  Problem Relation Age of Onset  . Prostate cancer Maternal Grandfather   .  Cancer Maternal Grandfather     prostate  . Colon polyps Maternal Grandmother   . Diabetes Mother   . Endometriosis Mother   . Cancer Mother     lung  . Diabetes Father   . Heart disease Father   . Cirrhosis Paternal Grandfather     History  Substance Use Topics  . Smoking status: Current Every Day Smoker -- 0.50 packs/day for 12 years    Types: Cigarettes  . Smokeless tobacco: Never Used  . Alcohol Use: Yes     Comment: rarely-social    OB History   Grav Para Term Preterm Abortions TAB SAB Ect Mult Living                  Review of Systems  Constitutional: Negative for fever and chills.  HENT: Positive for ear pain and dental problem. Negative for trouble swallowing.   All other systems reviewed and are negative.    Allergies  Latex  Home Medications   Current Outpatient Rx  Name  Route  Sig  Dispense  Refill  . albuterol (PROVENTIL HFA;VENTOLIN HFA) 108 (90 BASE) MCG/ACT inhaler   Inhalation   Inhale 2 puffs into the lungs every 6 (six) hours as needed for wheezing.          Marland Kitchen ibuprofen (ADVIL,MOTRIN) 200 MG tablet  Oral   Take 600-800 mg by mouth every 8 (eight) hours as needed for pain.         . medroxyPROGESTERone (DEPO-PROVERA) 150 MG/ML injection   Intramuscular   Inject 150 mg into the muscle every 3 (three) months.         . ranitidine (ZANTAC) 150 MG tablet   Oral   Take 150 mg by mouth at bedtime.           BP 107/65  Pulse 77  Temp(Src) 98.5 F (36.9 C) (Oral)  SpO2 99%  Physical Exam  Nursing note and vitals reviewed. Constitutional: She is oriented to person, place, and time. She appears well-developed and well-nourished. No distress.  HENT:  Head: Normocephalic and atraumatic.  Right Ear: Tympanic membrane and external ear normal.  Left Ear: Tympanic membrane and external ear normal.  Nose: Nose normal.  Mouth/Throat: Oropharynx is clear and moist. No oropharyngeal exudate.  2nd left lower molar with dental decay and  surrounding erythema and edema. Tenderness to palpation noted but no abscess. Left ear canal is erythematous and inflamed but no discharge noted. TM's normal bilaterally.   Eyes: EOM are normal. Pupils are equal, round, and reactive to light.  Neck: Normal range of motion. Neck supple. No tracheal deviation present.  Enlarged left submandibular node.   Cardiovascular: Normal rate, regular rhythm and normal heart sounds.   Pulmonary/Chest: Effort normal and breath sounds normal. No respiratory distress.  Abdominal: Soft. She exhibits no distension.  Musculoskeletal: Normal range of motion. She exhibits no edema.  Neurological: She is alert and oriented to person, place, and time.  Skin: Skin is warm and dry.  Psychiatric: She has a normal mood and affect. Her behavior is normal.    ED Course  Procedures (including critical care time)  DIAGNOSTIC STUDIES: Oxygen Saturation is 99% on room air, normal by my interpretation.    COORDINATION OF CARE:  16:55-Discussed planned course of treatment with the patient including d/c home with prescriptions for Cleocin and Tylenol #3, who is agreeable at this time. Advised to f/u with dentist.    Labs Reviewed - No data to display No results found.   1. Dental infection   2. Tooth decay   3. Otitis externa       MDM   Dental pain associated with dental infection. No evidence of dental abscess. Patient is afebrile, non toxic appearing and swallowing secretions well. I gave patient referral to dentist and stressed the importance of dental follow up for ultimate management of dental pain. I will also give clindamycin and pain control. Clinda given since she has been on amoxicillin recently without complete relief.  Rx ciprodex for otitis externa. Patient voices understanding and is agreeable to plan.    I personally performed the services described in this documentation, which was scribed in my presence. The recorded information has been  reviewed and is accurate.     Trevor Mace, PA-C 05/14/12 1706

## 2012-05-15 NOTE — ED Provider Notes (Signed)
Medical screening examination/treatment/procedure(s) were performed by non-physician practitioner and as supervising physician I was immediately available for consultation/collaboration.   Laray Anger, DO 05/15/12 1127

## 2012-05-16 ENCOUNTER — Emergency Department (HOSPITAL_COMMUNITY)
Admission: EM | Admit: 2012-05-16 | Discharge: 2012-05-16 | Disposition: A | Discharge status: Home / Self Care | Payer: Medicaid Other | Attending: Emergency Medicine | Admitting: Emergency Medicine

## 2012-05-16 ENCOUNTER — Encounter (HOSPITAL_COMMUNITY): Payer: Self-pay | Admitting: Emergency Medicine

## 2012-05-16 DIAGNOSIS — Z79899 Other long term (current) drug therapy: Secondary | ICD-10-CM | POA: Insufficient documentation

## 2012-05-16 DIAGNOSIS — Z8742 Personal history of other diseases of the female genital tract: Secondary | ICD-10-CM | POA: Insufficient documentation

## 2012-05-16 DIAGNOSIS — Z9089 Acquired absence of other organs: Secondary | ICD-10-CM | POA: Insufficient documentation

## 2012-05-16 DIAGNOSIS — Z9889 Other specified postprocedural states: Secondary | ICD-10-CM | POA: Insufficient documentation

## 2012-05-16 DIAGNOSIS — K2970 Gastritis, unspecified, without bleeding: Secondary | ICD-10-CM | POA: Insufficient documentation

## 2012-05-16 DIAGNOSIS — R1013 Epigastric pain: Secondary | ICD-10-CM | POA: Insufficient documentation

## 2012-05-16 DIAGNOSIS — Z8719 Personal history of other diseases of the digestive system: Secondary | ICD-10-CM | POA: Insufficient documentation

## 2012-05-16 DIAGNOSIS — Z8679 Personal history of other diseases of the circulatory system: Secondary | ICD-10-CM | POA: Insufficient documentation

## 2012-05-16 DIAGNOSIS — R112 Nausea with vomiting, unspecified: Secondary | ICD-10-CM | POA: Insufficient documentation

## 2012-05-16 DIAGNOSIS — Z8619 Personal history of other infectious and parasitic diseases: Secondary | ICD-10-CM | POA: Insufficient documentation

## 2012-05-16 DIAGNOSIS — Z8659 Personal history of other mental and behavioral disorders: Secondary | ICD-10-CM | POA: Insufficient documentation

## 2012-05-16 DIAGNOSIS — E876 Hypokalemia: Secondary | ICD-10-CM | POA: Insufficient documentation

## 2012-05-16 DIAGNOSIS — J45909 Unspecified asthma, uncomplicated: Secondary | ICD-10-CM | POA: Insufficient documentation

## 2012-05-16 DIAGNOSIS — Z8739 Personal history of other diseases of the musculoskeletal system and connective tissue: Secondary | ICD-10-CM | POA: Insufficient documentation

## 2012-05-16 DIAGNOSIS — M129 Arthropathy, unspecified: Secondary | ICD-10-CM | POA: Insufficient documentation

## 2012-05-16 DIAGNOSIS — E669 Obesity, unspecified: Secondary | ICD-10-CM | POA: Insufficient documentation

## 2012-05-16 DIAGNOSIS — K047 Periapical abscess without sinus: Secondary | ICD-10-CM | POA: Insufficient documentation

## 2012-05-16 DIAGNOSIS — F17200 Nicotine dependence, unspecified, uncomplicated: Secondary | ICD-10-CM | POA: Insufficient documentation

## 2012-05-16 LAB — CBC
HCT: 35.3 % — ABNORMAL LOW (ref 36.0–46.0)
Hemoglobin: 12 g/dL (ref 12.0–15.0)
MCH: 28.5 pg (ref 26.0–34.0)
MCHC: 34 g/dL (ref 30.0–36.0)
MCV: 83.8 fL (ref 78.0–100.0)
RDW: 15.6 % — ABNORMAL HIGH (ref 11.5–15.5)

## 2012-05-16 LAB — BASIC METABOLIC PANEL
BUN: 18 mg/dL (ref 6–23)
Chloride: 103 mEq/L (ref 96–112)
Creatinine, Ser: 1.07 mg/dL (ref 0.50–1.10)
GFR calc Af Amer: 82 mL/min — ABNORMAL LOW (ref >90)
Glucose, Bld: 89 mg/dL (ref 70–99)
Potassium: 3.2 mEq/L — ABNORMAL LOW (ref 3.5–5.1)

## 2012-05-16 MED ORDER — IBUPROFEN 200 MG PO TABS
400.0000 mg | ORAL_TABLET | Freq: Once | ORAL | Status: AC
  Administered 2012-05-16: 400 mg via ORAL
  Filled 2012-05-16: qty 2

## 2012-05-16 MED ORDER — ONDANSETRON HCL 4 MG/2ML IJ SOLN
4.0000 mg | Freq: Once | INTRAMUSCULAR | Status: AC
  Administered 2012-05-16: 4 mg via INTRAVENOUS
  Filled 2012-05-16: qty 2

## 2012-05-16 MED ORDER — SODIUM CHLORIDE 0.9 % IV BOLUS (SEPSIS)
1000.0000 mL | Freq: Once | INTRAVENOUS | Status: AC
  Administered 2012-05-16: 1000 mL via INTRAVENOUS

## 2012-05-16 MED ORDER — PROMETHAZINE HCL 25 MG PO TABS: 25.0000 mg | ORAL_TABLET | Freq: Four times a day (QID) | ORAL | Status: DC | PRN

## 2012-05-16 MED ORDER — ONDANSETRON 4 MG PO TBDP
4.0000 mg | ORAL_TABLET | Freq: Once | ORAL | Status: AC
  Administered 2012-05-16: 4 mg via ORAL
  Filled 2012-05-16: qty 1

## 2012-05-16 MED ORDER — FAMOTIDINE IN NACL 20-0.9 MG/50ML-% IV SOLN
20.0000 mg | Freq: Once | INTRAVENOUS | Status: AC
  Administered 2012-05-16: 20 mg via INTRAVENOUS
  Filled 2012-05-16: qty 50

## 2012-05-16 MED ORDER — POTASSIUM CHLORIDE CRYS ER 20 MEQ PO TBCR
40.0000 meq | EXTENDED_RELEASE_TABLET | Freq: Once | ORAL | Status: AC
  Administered 2012-05-16: 40 meq via ORAL
  Filled 2012-05-16: qty 2

## 2012-05-16 NOTE — ED Notes (Signed)
Patient aware need urine sample. Stated she just went and forgot to get sample.

## 2012-05-16 NOTE — ED Provider Notes (Signed)
History     CSN: [id number]  Arrival date & time 05/16/12  0619   First MD Initiated Contact with Patient 05/16/12 [phone]      Chief Complaint  Patient presents with  . Abdominal Pain  . Emesis    (Consider location/radiation/quality/duration/timing/severity/associated sxs/prior treatment) HPI  [name] is a 27 y.o. female complaining of 3 episodes of nonbloody nonbilious emesis starting this am at 3 AM. She reports an epigastric discomfort described as mild this started after the emesis. She denies fever, diarrhea, abdominal pain, dysuria. Patient has positive sick contact her daughter has similar symptoms in addition to a rash and diarrhea.  Past Medical History  Diagnosis Date  . Scoliosis   . Obesity   . Migraine   . Anxiety   . Gallstones   . Asthma   . Arthritis   . Pelvic inflammatory disease (PID) 05-08-11    previous hx. .Hx. childbirth x3-NVD    Past Surgical History  Procedure Laterality Date  . Abdominal exploration surgery  approx 27 years old    rlq(due to adhesions around ovaries)-appendix and ovaries remain  . Childbirth    . Cholecystectomy  05/10/2011    Procedure: LAPAROSCOPIC CHOLECYSTECTOMY WITH INTRAOPERATIVE CHOLANGIOGRAM;  Surgeon: Atilano Ina, MD,FACS;  Location: [facility];  Service: General;  Laterality: N/A;    Family History  Problem Relation Age of Onset  . Prostate cancer Maternal Grandfather   . Cancer Maternal Grandfather     prostate  . Colon polyps Maternal Grandmother   . Diabetes Mother   . Endometriosis Mother   . Cancer Mother     lung  . Diabetes Father   . Heart disease Father   . Cirrhosis Paternal Grandfather     History  Substance Use Topics  . Smoking status: Current Every Day Smoker -- 0.50 packs/day for 12 years    Types: Cigarettes  . Smokeless tobacco: Never Used  . Alcohol Use: Yes     Comment: rarely-social    OB History   Grav Para Term Preterm Abortions TAB SAB Ect Mult Living                   Review of Systems  Constitutional: Negative for fever.  Respiratory: Negative for shortness of breath.   Cardiovascular: Negative for chest pain.  Gastrointestinal: Positive for nausea and vomiting. Negative for abdominal pain and diarrhea.  All other systems reviewed and are negative.    Allergies  Latex  Home Medications   Current Outpatient Rx  Name  Route  Sig  Dispense  Refill  . acetaminophen-codeine (TYLENOL #3) 300-30 MG per tablet   Oral   Take 1-2 tablets by mouth every 6 (six) hours as needed for pain.   8 tablet   0   . albuterol (PROVENTIL HFA;VENTOLIN HFA) 108 (90 BASE) MCG/ACT inhaler   Inhalation   Inhale 2 puffs into the lungs every 6 (six) hours as needed for wheezing.          . clindamycin (CLEOCIN) 300 MG capsule   Oral   Take 1 capsule (300 mg total) by mouth 4 (four) times daily. X 7 days   28 capsule   0   . ibuprofen (ADVIL,MOTRIN) 200 MG tablet   Oral   Take 600-800 mg by mouth every 8 (eight) hours as needed for pain.         . medroxyPROGESTERone (DEPO-PROVERA) 150 MG/ML injection   Intramuscular   Inject 150 mg into the  muscle every 3 (three) months.         . ranitidine (ZANTAC) 150 MG tablet   Oral   Take 150 mg by mouth at bedtime.           BP 111/60  Pulse 73  Temp(Src) 98.2 F (36.8 C) (Oral)  Resp 20  SpO2 99%  Physical Exam  Nursing note and vitals reviewed. Constitutional: She is oriented to person, place, and time. She appears well-developed and well-nourished. No distress.  HENT:  Head: Normocephalic.  Eyes: Conjunctivae and EOM are normal. Pupils are equal, round, and reactive to light.  Cardiovascular: Normal rate, regular rhythm, normal heart sounds and intact distal pulses.   Pulmonary/Chest: Effort normal and breath sounds normal. No stridor. No respiratory distress. She has no wheezes. She has no rales. She exhibits no tenderness.  Abdominal: Soft. Bowel sounds are normal. She exhibits no  distension and no mass. There is tenderness. There is no rebound and no guarding.  Mild TTP of epigastrium with no rebound   Musculoskeletal: Normal range of motion.  Neurological: She is alert and oriented to person, place, and time.  Psychiatric: She has a normal mood and affect.    ED Course  Procedures (including critical care time)  Labs Reviewed  BASIC METABOLIC PANEL - Abnormal; Notable for the following:    Potassium 3.2 (*)    GFR calc non Af Amer 70 (*)    GFR calc Af Amer 82 (*)    All other components within normal limits  CBC - Abnormal; Notable for the following:    WBC 13.3 (*)    HCT 35.3 (*)    RDW 15.6 (*)    All other components within normal limits   No results found.  7:47 AM patient seen and reexamined, abdominal exam remains nonsurgical. Patient states that she is still nauseous and is hesitant to try a by mouth challenge. IV access has not yet been initiated.  1. Gastritis   2. Hypokalemia   3. Nausea & vomiting       MDM  Likely gastrointestinal enteritis contracted from her daughter. Patient is tolerating by mouth, has received 1 L of fluid, abdominal exam remains nonsurgical. She is stable for discharge at this time. Found to be mildly hypokalemic I will give her 40 mEq by mouth.   Pt verbalized understanding and agrees with care plan. Outpatient follow-up and return precautions given.    New Prescriptions   PROMETHAZINE (PHENERGAN) 25 MG TABLET    Take 1 tablet (25 mg total) by mouth every 6 (six) hours as needed for nausea.          Wynetta Emery, PA-C 05/16/12 [phone]

## 2012-05-16 NOTE — ED Provider Notes (Signed)
  Medical screening examination/treatment/procedure(s) were performed by non-physician practitioner and as supervising physician I was immediately available for consultation/collaboration.    Gerhard Munch, MD 05/16/12 1100

## 2012-05-16 NOTE — ED Notes (Signed)
Pt states she has an abscessed tooth and an outer ear infection and is taking clyndamycin  Pt states she vomited yesterday after taking it  Pt's daughter is a pt in the ER at this time and she states after laying with her she started having abd pain and burning in her epigastric area and went outside to get some fresh air and had an episode of vomiting out there

## 2012-05-16 NOTE — ED Notes (Signed)
While in triage pt started vomiting again  Approximately 100 cc

## 2012-09-29 ENCOUNTER — Emergency Department (HOSPITAL_COMMUNITY)
Admission: EM | Admit: 2012-09-29 | Discharge: 2012-09-30 | Disposition: A | Discharge status: Home / Self Care | Payer: Medicaid Other | Attending: Emergency Medicine | Admitting: Emergency Medicine

## 2012-09-29 ENCOUNTER — Encounter (HOSPITAL_COMMUNITY): Payer: Self-pay | Admitting: *Deleted

## 2012-09-29 DIAGNOSIS — M549 Dorsalgia, unspecified: Secondary | ICD-10-CM

## 2012-09-29 DIAGNOSIS — Z8739 Personal history of other diseases of the musculoskeletal system and connective tissue: Secondary | ICD-10-CM | POA: Insufficient documentation

## 2012-09-29 DIAGNOSIS — Z8679 Personal history of other diseases of the circulatory system: Secondary | ICD-10-CM | POA: Insufficient documentation

## 2012-09-29 DIAGNOSIS — Y9389 Activity, other specified: Secondary | ICD-10-CM | POA: Insufficient documentation

## 2012-09-29 DIAGNOSIS — Z8742 Personal history of other diseases of the female genital tract: Secondary | ICD-10-CM | POA: Insufficient documentation

## 2012-09-29 DIAGNOSIS — Y929 Unspecified place or not applicable: Secondary | ICD-10-CM | POA: Insufficient documentation

## 2012-09-29 DIAGNOSIS — Z8719 Personal history of other diseases of the digestive system: Secondary | ICD-10-CM | POA: Insufficient documentation

## 2012-09-29 DIAGNOSIS — J45909 Unspecified asthma, uncomplicated: Secondary | ICD-10-CM | POA: Insufficient documentation

## 2012-09-29 DIAGNOSIS — F411 Generalized anxiety disorder: Secondary | ICD-10-CM | POA: Insufficient documentation

## 2012-09-29 DIAGNOSIS — X503XXA Overexertion from repetitive movements, initial encounter: Secondary | ICD-10-CM | POA: Insufficient documentation

## 2012-09-29 DIAGNOSIS — F17200 Nicotine dependence, unspecified, uncomplicated: Secondary | ICD-10-CM | POA: Insufficient documentation

## 2012-09-29 DIAGNOSIS — E669 Obesity, unspecified: Secondary | ICD-10-CM | POA: Insufficient documentation

## 2012-09-29 DIAGNOSIS — Z79899 Other long term (current) drug therapy: Secondary | ICD-10-CM | POA: Insufficient documentation

## 2012-09-29 DIAGNOSIS — M129 Arthropathy, unspecified: Secondary | ICD-10-CM | POA: Insufficient documentation

## 2012-09-29 DIAGNOSIS — IMO0002 Reserved for concepts with insufficient information to code with codable children: Secondary | ICD-10-CM | POA: Insufficient documentation

## 2012-09-29 DIAGNOSIS — Z91040 Latex allergy status: Secondary | ICD-10-CM | POA: Insufficient documentation

## 2012-09-29 NOTE — ED Provider Notes (Signed)
History  This chart was scribed for Antony Madura - PA by Manuela Schwartz, ED scribe. This patient was seen in room WTR6/WTR6 and the patient's care was started at 2228.  CSN: [id number] Arrival date & time 09/29/12  2120  First MD Initiated Contact with Patient 09/29/12 2228     Chief Complaint  Patient presents with  . Back Pain   Patient is a 27 y.o. female presenting with back pain. The history is provided by the patient. No language interpreter was used.  Back Pain Location:  Thoracic spine Quality:  Aching Pain severity:  Moderate Pain is:  Same all the time Onset quality:  Gradual Duration:  3 days Timing:  Constant Progression:  Unchanged Chronicity:  Recurrent Context: lifting heavy objects   Relieved by:  Nothing Worsened by:  Bending and ambulation Ineffective treatments:  Narcotics and ibuprofen Associated symptoms: no abdominal pain, no abdominal swelling, no bladder incontinence, no bowel incontinence, no chest pain, no fever and no weakness    HPI Comments: [name] is a 27 y.o. 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 She states has been a previous similar episode and was tx w/flexeril and 2 steroid shots that provided her only minimal relief. She states her arms feel like they fall asleep at times and c/o paraesthesias bilaterally. She denies any numbness or weakness to her upper extremities. She denies urinary/bowel incontinence or perineal numbness. Nothing makes her back pain better but is worse with walking or bending movements. She denies any other injuries.     Past Medical History  Diagnosis Date  . Scoliosis   . Obesity   . Migraine   . Anxiety   . Gallstones   . Asthma   . Arthritis   . Pelvic inflammatory disease (PID) 05-08-11    previous hx. .Hx.  childbirth x3-NVD   Past Surgical History  Procedure Laterality Date  . Abdominal exploration surgery  approx 27 years old    rlq(due to adhesions around ovaries)-appendix and ovaries remain  . Childbirth    . Cholecystectomy  05/10/2011    Procedure: LAPAROSCOPIC CHOLECYSTECTOMY WITH INTRAOPERATIVE CHOLANGIOGRAM;  Surgeon: Atilano Ina, MD,FACS;  Location: [facility];  Service: General;  Laterality: N/A;   Family History  Problem Relation Age of Onset  . Prostate cancer Maternal Grandfather   . Cancer Maternal Grandfather     prostate  . Colon polyps Maternal Grandmother   . Diabetes Mother   . Endometriosis Mother   . Cancer Mother     lung  . Diabetes Father   . Heart disease Father   . Cirrhosis Paternal Grandfather    History  Substance Use Topics  . Smoking status: Current Every Day Smoker -- 0.50 packs/day for 12 years    Types: Cigarettes  . Smokeless tobacco: Never Used  . Alcohol Use: Yes     Comment: rarely-social   OB History   Grav Para Term Preterm Abortions TAB SAB Ect Mult Living                 Review of Systems  Constitutional: Negative for fever and chills.  HENT: Negative for congestion and rhinorrhea.   Respiratory: Negative for shortness of breath.   Cardiovascular: Negative for chest pain.  Gastrointestinal: Negative for nausea, vomiting, abdominal pain and  bowel incontinence.  Genitourinary: Negative for bladder incontinence.  Musculoskeletal: Positive for back pain (right thoracic back pain).  Neurological: Negative for weakness.  All other systems reviewed and are negative.  A complete 10 system review of systems was obtained and all systems are negative except as noted in the HPI and PMH.    Allergies  Latex  Home Medications   Current Outpatient Rx  Name  Route  Sig  Dispense  Refill  . albuterol (PROVENTIL HFA;VENTOLIN HFA) 108 (90 BASE) MCG/ACT inhaler   Inhalation   Inhale 2 puffs into the lungs every 6 (six) hours as needed for  wheezing.          Marland Kitchen CALCIUM-VITAMIN D PO   Oral   Take 1 capsule by mouth daily.         . cyclobenzaprine (FLEXERIL) 10 MG tablet   Oral   Take 10 mg by mouth 3 (three) times daily as needed for muscle spasms (back pain).         Marland Kitchen diclofenac (CATAFLAM) 50 MG tablet   Oral   Take 50 mg by mouth 2 (two) times daily.         Marland Kitchen FLUoxetine (PROZAC) 20 MG capsule   Oral   Take 60 mg by mouth daily.         . hydrOXYzine (VISTARIL) 25 MG capsule   Oral   Take 25 mg by mouth 2 (two) times daily.         Marland Kitchen ibuprofen (ADVIL,MOTRIN) 200 MG tablet   Oral   Take 600-800 mg by mouth every 8 (eight) hours as needed for pain.         . medroxyPROGESTERone (DEPO-PROVERA) 150 MG/ML injection   Intramuscular   Inject 150 mg into the muscle every 3 (three) months.         . Multiple Vitamins-Minerals (WOMENS ONE DAILY PO)   Oral   Take 1 capsule by mouth.         . traZODone (DESYREL) 50 MG tablet   Oral   Take 100 mg by mouth at bedtime.         Marland Kitchen HYDROcodone-acetaminophen (NORCO/VICODIN) 5-325 MG per tablet   Oral   Take 1 tablet by mouth every 6 (six) hours as needed for pain.   16 tablet   0   . predniSONE (DELTASONE) 20 MG tablet   Oral   Take 2 tablets (40 mg total) by mouth daily.   10 tablet   0    Triage Vitals: BP 118/68  Pulse 72  Temp(Src) 98.8 F (37.1 C)  Resp 18  SpO2 99% Physical Exam  Nursing note and vitals reviewed. Constitutional: She is oriented to person, place, and time. She appears well-developed and well-nourished. No distress.  HENT:  Head: Normocephalic and atraumatic.  Eyes: Conjunctivae and EOM are normal. No scleral icterus.  Neck: Neck supple. No tracheal deviation present.  Cardiovascular: Normal rate, regular rhythm and intact distal pulses.   Distal radial pulses 2+ bilaterally  Pulmonary/Chest: Effort normal. No respiratory distress.  Musculoskeletal: Normal range of motion. She exhibits tenderness.  Tenderness  to palpation of the thoracic and lumbar paraspinal muscles Equal grip strength bilateral with 5/5 strength against resistance in upper and lower extremities. No sensory or motor deficits appreciated. No cervical, thoracic, or midline tenderness to palpation. No bony deformities or step-offs palpated.  Neurological: She is alert and oriented to person, place, and time.  Skin: Skin is warm and dry. No  rash noted. No erythema. No pallor.  Psychiatric: She has a normal mood and affect. Her behavior is normal.    ED Course  Procedures (including critical care time) DIAGNOSTIC STUDIES: Oxygen Saturation is 99% on room air, normal by my interpretation.    COORDINATION OF CARE: At 1155 PM Discussed treatment plan with patient which includes pain medicine. Patient agrees.   Labs Reviewed - No data to display No results found.  1. Chronic back pain     MDM  Uncomplicated back pain - patient neurovascularly intact, well and nontoxic appearing, and hemodynamically stable. No red flags or signs concerning for cauda equina. Pain likely secondary to occupational overuse. Patient appropriate for discharge with Vicodin to take for severe pain and 5 day course of prednisone. Ice followed by alternating ice and heat packs advised. Orthopedic referral given if symptoms do not begin to improve in 5-7 days. Indications for ED return discussed with the patient who verbalizes comfort and understanding with this discharge plan.  I personally performed the services described in this documentation, which was scribed in my presence. The recorded information has been reviewed and is accurate.    Antony Madura, PA-C 10/09/12 1701

## 2012-09-29 NOTE — ED Notes (Signed)
Pt seen on 7/2 at [facility] and given two steroid shots and flexeril that helped that day for c/o back and arm pain; states meds no longer working; unable to sleep; both arms feel like they are asleep at times

## 2012-09-29 NOTE — ED Notes (Signed)
Pt complains of chronic back pain and sciatica pain, was seen at [facility] before but no relief.

## 2012-09-30 MED ORDER — HYDROCODONE-ACETAMINOPHEN 5-325 MG PO TABS: 1.0000 | ORAL_TABLET | Freq: Four times a day (QID) | ORAL | Status: DC | PRN

## 2012-09-30 MED ORDER — DEXAMETHASONE SODIUM PHOSPHATE 10 MG/ML IJ SOLN
10.0000 mg | Freq: Once | INTRAMUSCULAR | Status: AC
  Administered 2012-09-30: 10 mg via INTRAMUSCULAR
  Filled 2012-09-30: qty 1

## 2012-09-30 MED ORDER — KETOROLAC TROMETHAMINE 30 MG/ML IJ SOLN
30.0000 mg | Freq: Once | INTRAMUSCULAR | Status: AC
  Administered 2012-09-30: 30 mg via INTRAMUSCULAR
  Filled 2012-09-30: qty 1

## 2012-09-30 MED ORDER — PREDNISONE 20 MG PO TABS: 40.0000 mg | ORAL_TABLET | Freq: Every day | ORAL | Status: DC

## 2012-09-30 NOTE — ED Provider Notes (Signed)
Medical screening examination/treatment/procedure(s) were performed by non-physician practitioner and as supervising physician I was immediately available for consultation/collaboration.   Hurman Horn, MD 09/30/12 2026

## 2012-10-09 ENCOUNTER — Encounter (HOSPITAL_COMMUNITY): Payer: Self-pay

## 2012-10-09 ENCOUNTER — Emergency Department (HOSPITAL_COMMUNITY): Payer: Medicaid Other

## 2012-10-09 ENCOUNTER — Emergency Department (HOSPITAL_COMMUNITY)
Admission: EM | Admit: 2012-10-09 | Discharge: 2012-10-10 | Disposition: A | Discharge status: Home / Self Care | Payer: Medicaid Other | Attending: Emergency Medicine | Admitting: Emergency Medicine

## 2012-10-09 DIAGNOSIS — M5431 Sciatica, right side: Secondary | ICD-10-CM

## 2012-10-09 DIAGNOSIS — J45909 Unspecified asthma, uncomplicated: Secondary | ICD-10-CM | POA: Insufficient documentation

## 2012-10-09 DIAGNOSIS — Z8679 Personal history of other diseases of the circulatory system: Secondary | ICD-10-CM | POA: Insufficient documentation

## 2012-10-09 DIAGNOSIS — F411 Generalized anxiety disorder: Secondary | ICD-10-CM | POA: Insufficient documentation

## 2012-10-09 DIAGNOSIS — E669 Obesity, unspecified: Secondary | ICD-10-CM | POA: Insufficient documentation

## 2012-10-09 DIAGNOSIS — R0602 Shortness of breath: Secondary | ICD-10-CM | POA: Insufficient documentation

## 2012-10-09 DIAGNOSIS — M5430 Sciatica, unspecified side: Secondary | ICD-10-CM | POA: Insufficient documentation

## 2012-10-09 DIAGNOSIS — Z79899 Other long term (current) drug therapy: Secondary | ICD-10-CM | POA: Insufficient documentation

## 2012-10-09 DIAGNOSIS — Z8719 Personal history of other diseases of the digestive system: Secondary | ICD-10-CM | POA: Insufficient documentation

## 2012-10-09 DIAGNOSIS — Z3202 Encounter for pregnancy test, result negative: Secondary | ICD-10-CM | POA: Insufficient documentation

## 2012-10-09 DIAGNOSIS — F17200 Nicotine dependence, unspecified, uncomplicated: Secondary | ICD-10-CM | POA: Insufficient documentation

## 2012-10-09 DIAGNOSIS — R609 Edema, unspecified: Secondary | ICD-10-CM

## 2012-10-09 DIAGNOSIS — Z91040 Latex allergy status: Secondary | ICD-10-CM | POA: Insufficient documentation

## 2012-10-09 DIAGNOSIS — R635 Abnormal weight gain: Secondary | ICD-10-CM | POA: Insufficient documentation

## 2012-10-09 DIAGNOSIS — K219 Gastro-esophageal reflux disease without esophagitis: Secondary | ICD-10-CM | POA: Insufficient documentation

## 2012-10-09 DIAGNOSIS — R1013 Epigastric pain: Secondary | ICD-10-CM | POA: Insufficient documentation

## 2012-10-09 DIAGNOSIS — Z8739 Personal history of other diseases of the musculoskeletal system and connective tissue: Secondary | ICD-10-CM | POA: Insufficient documentation

## 2012-10-09 DIAGNOSIS — R110 Nausea: Secondary | ICD-10-CM | POA: Insufficient documentation

## 2012-10-09 DIAGNOSIS — R0989 Other specified symptoms and signs involving the circulatory and respiratory systems: Secondary | ICD-10-CM | POA: Insufficient documentation

## 2012-10-09 DIAGNOSIS — Z8742 Personal history of other diseases of the female genital tract: Secondary | ICD-10-CM | POA: Insufficient documentation

## 2012-10-09 LAB — COMPREHENSIVE METABOLIC PANEL
Alkaline Phosphatase: 67 U/L (ref 39–117)
BUN: 14 mg/dL (ref 6–23)
CO2: 26 mEq/L (ref 19–32)
Chloride: 101 mEq/L (ref 96–112)
Creatinine, Ser: 0.95 mg/dL (ref 0.50–1.10)
GFR calc non Af Amer: 81 mL/min — ABNORMAL LOW (ref >90)
Glucose, Bld: 81 mg/dL (ref 70–99)
Potassium: 4.3 mEq/L (ref 3.5–5.1)
Total Bilirubin: 0.2 mg/dL — ABNORMAL LOW (ref 0.3–1.2)

## 2012-10-09 LAB — URINALYSIS, ROUTINE W REFLEX MICROSCOPIC
Bilirubin Urine: NEGATIVE
Glucose, UA: NEGATIVE mg/dL
Hgb urine dipstick: NEGATIVE
Specific Gravity, Urine: 1.017 (ref 1.005–1.030)
Urobilinogen, UA: 1 mg/dL (ref 0.0–1.0)
pH: 6 (ref 5.0–8.0)

## 2012-10-09 LAB — CBC
HCT: 33.8 % — ABNORMAL LOW (ref 36.0–46.0)
Hemoglobin: 11.2 g/dL — ABNORMAL LOW (ref 12.0–15.0)
MCV: 85.4 fL (ref 78.0–100.0)
RBC: 3.96 MIL/uL (ref 3.87–5.11)
WBC: 13.4 10*3/uL — ABNORMAL HIGH (ref 4.0–10.5)

## 2012-10-09 LAB — PRO B NATRIURETIC PEPTIDE: Pro B Natriuretic peptide (BNP): 108.2 pg/mL (ref 0–125)

## 2012-10-09 MED ORDER — HYDROMORPHONE HCL PF 1 MG/ML IJ SOLN
1.0000 mg | Freq: Once | INTRAMUSCULAR | Status: AC
  Administered 2012-10-09: 1 mg via INTRAMUSCULAR
  Filled 2012-10-09: qty 1

## 2012-10-09 MED ORDER — OMEPRAZOLE 20 MG PO CPDR: 20.0000 mg | DELAYED_RELEASE_CAPSULE | Freq: Every day | ORAL | Status: DC

## 2012-10-09 MED ORDER — ALBUTEROL SULFATE (5 MG/ML) 0.5% IN NEBU
5.0000 mg | INHALATION_SOLUTION | Freq: Once | RESPIRATORY_TRACT | Status: AC
  Administered 2012-10-09: 5 mg via RESPIRATORY_TRACT
  Filled 2012-10-09: qty 1

## 2012-10-09 MED ORDER — GI COCKTAIL ~~LOC~~
30.0000 mL | Freq: Once | ORAL | Status: AC
  Administered 2012-10-09: 30 mL via ORAL
  Filled 2012-10-09: qty 30

## 2012-10-09 MED ORDER — ALBUTEROL SULFATE HFA 108 (90 BASE) MCG/ACT IN AERS
2.0000 | INHALATION_SPRAY | Freq: Once | RESPIRATORY_TRACT | Status: DC
  Filled 2012-10-09: qty 6.7

## 2012-10-09 MED ORDER — FAMOTIDINE 20 MG PO TABS
40.0000 mg | ORAL_TABLET | Freq: Once | ORAL | Status: AC
  Administered 2012-10-09: 40 mg via ORAL
  Filled 2012-10-09: qty 2

## 2012-10-09 NOTE — ED Provider Notes (Signed)
History    CSN: [id number] Arrival date & time 10/09/12  2008  First MD Initiated Contact with Patient 10/09/12 2043     Chief Complaint  Patient presents with  . Back Pain  . Leg Swelling   (Consider location/radiation/quality/duration/timing/severity/associated sxs/prior Treatment) The history is provided by the patient and medical records. No language interpreter was used.    [name] is a 27 y.o. female  with a hx of DDD, anxiety, gallstones, asthma, arthritis presents to the Emergency Department complaining of gradual, persistent, progressively worsening abdominal pain beginning 2-3 days ago.  Pt states it feels as if her skin is stretching. Pt is on depo regularly.  Pt thinks she might be pregnant.  Pt states she was seen 09/29/12 for chronic back pain and aggravation of her sciatica.  Pt was d/c with prednisone, flexeril and pain medication which has since run out.  Pt states she also has leg swelling beginning this afternoon.  Pt states she has never had this happen before.  Associated symptoms include mid thoracic back pain and peripheral edema, weight gain (16 lbs in 1 mo). Pt also c/o mild SOB throughout the day.  Standing or sitting for too long makes all of the symptoms worse and nothing makes them better.  Pt denies fever, chills, headache, neck pain, chest pain, nausea, vomiting, diarrhea, weakness, dizziness, syncope, dysuria, hematuria.     Past Medical History  Diagnosis Date  . Scoliosis   . Obesity   . Migraine   . Anxiety   . Gallstones   . Asthma   . Arthritis   . Pelvic inflammatory disease (PID) 05-08-11    previous hx. .Hx. childbirth x3-NVD   Past Surgical History  Procedure Laterality Date  . Abdominal exploration surgery  approx 27 years old    rlq(due to adhesions around ovaries)-appendix and ovaries remain  . Childbirth    . Cholecystectomy  05/10/2011    Procedure: LAPAROSCOPIC CHOLECYSTECTOMY WITH INTRAOPERATIVE CHOLANGIOGRAM;  Surgeon: Atilano Ina, MD,FACS;  Location: [facility];  Service: General;  Laterality: N/A;   Family History  Problem Relation Age of Onset  . Prostate cancer Maternal Grandfather   . Cancer Maternal Grandfather     prostate  . Colon polyps Maternal Grandmother   . Diabetes Mother   . Endometriosis Mother   . Cancer Mother     lung  . Diabetes Father   . Heart disease Father   . Cirrhosis Paternal Grandfather    History  Substance Use Topics  . Smoking status: Current Every Day Smoker -- 0.50 packs/day for 12 years    Types: Cigarettes  . Smokeless tobacco: Never Used  . Alcohol Use: Yes     Comment: rarely-social   OB History   Grav Para Term Preterm Abortions TAB SAB Ect Mult Living                 Review of Systems  Constitutional: Positive for unexpected weight change. Negative for fever, diaphoresis, appetite change and fatigue.  HENT: Negative for mouth sores, trouble swallowing, neck pain and neck stiffness.   Respiratory: Positive for shortness of breath. Negative for cough, chest tightness, wheezing and stridor.   Cardiovascular: Positive for leg swelling. Negative for chest pain and palpitations.  Gastrointestinal: Positive for nausea and abdominal pain. Negative for vomiting, diarrhea, constipation, blood in stool, abdominal distention and rectal pain.  Endocrine: Negative for polydipsia, polyphagia and polyuria.  Genitourinary: Negative for dysuria, urgency, frequency, hematuria, flank pain and  difficulty urinating.  Musculoskeletal: Positive for back pain.  Skin: Negative for rash.  Allergic/Immunologic: Negative for immunocompromised state.  Neurological: Negative for weakness.  Hematological: Negative for adenopathy. Does not bruise/bleed easily.  Psychiatric/Behavioral: Negative for confusion.  All other systems reviewed and are negative.    Allergies  Latex  Home Medications   Current Outpatient Rx  Name  Route  Sig  Dispense  Refill  . albuterol (PROVENTIL  HFA;VENTOLIN HFA) 108 (90 BASE) MCG/ACT inhaler   Inhalation   Inhale 2 puffs into the lungs every 6 (six) hours as needed for wheezing.          Marland Kitchen CALCIUM-VITAMIN D PO   Oral   Take 1 capsule by mouth daily.         . cyclobenzaprine (FLEXERIL) 10 MG tablet   Oral   Take 10 mg by mouth 3 (three) times daily as needed for muscle spasms (back pain).         Marland Kitchen diclofenac (CATAFLAM) 50 MG tablet   Oral   Take 50 mg by mouth 2 (two) times daily.         Marland Kitchen FLUoxetine (PROZAC) 20 MG capsule   Oral   Take 60 mg by mouth daily.         Marland Kitchen HYDROcodone-acetaminophen (NORCO/VICODIN) 5-325 MG per tablet   Oral   Take 1 tablet by mouth every 6 (six) hours as needed for pain.   16 tablet   0   . hydrOXYzine (VISTARIL) 25 MG capsule   Oral   Take 25 mg by mouth 2 (two) times daily.         Marland Kitchen ibuprofen (ADVIL,MOTRIN) 200 MG tablet   Oral   Take 600-800 mg by mouth every 8 (eight) hours as needed for pain.         . medroxyPROGESTERone (DEPO-PROVERA) 150 MG/ML injection   Intramuscular   Inject 150 mg into the muscle every 3 (three) months.         . Multiple Vitamins-Minerals (WOMENS ONE DAILY PO)   Oral   Take 1 capsule by mouth.         . traZODone (DESYREL) 50 MG tablet   Oral   Take 100 mg by mouth at bedtime.         Marland Kitchen omeprazole (PRILOSEC) 20 MG capsule   Oral   Take 1 capsule (20 mg total) by mouth daily.   30 capsule   0    BP 132/53  Pulse 99  Temp(Src) 99.1 F (37.3 C) (Oral)  Resp 20  SpO2 99% Physical Exam  Nursing note and vitals reviewed. Constitutional: She is oriented to person, place, and time. She appears well-developed and well-nourished. No distress.  HENT:  Head: Normocephalic and atraumatic.  Mouth/Throat: Oropharynx is clear and moist. No oropharyngeal exudate.  Eyes: Conjunctivae and EOM are normal. Pupils are equal, round, and reactive to light. No scleral icterus.  Neck: Normal range of motion. Neck supple.  Full ROM  without pain  Cardiovascular: Normal rate, regular rhythm, normal heart sounds and intact distal pulses.   No murmur heard. Pulmonary/Chest: Effort normal. No respiratory distress. She has no decreased breath sounds. She has no wheezes. She has rhonchi. She has no rales.  Fine rhonchi in bilateral lung bases, no wheezes  Abdominal: Soft. Normal appearance and bowel sounds are normal. She exhibits no distension and no mass. There is tenderness in the epigastric area. There is guarding. There is no rigidity, no rebound and  no CVA tenderness.  obese  Musculoskeletal: Normal range of motion. She exhibits edema.  Full range of motion of the T-spine and L-spine No tenderness to palpation of the spinous processes of the T-spine or L-spine Mild tenderness to palpation of the paraspinous muscles of the L-spine with more intense pain with palpation to the right buttock Mild peripheral edema of the lower extremities without pitting or pain  Lymphadenopathy:    She has no cervical adenopathy.  Neurological: She is alert and oriented to person, place, and time. She has normal reflexes. She exhibits normal muscle tone. Coordination normal.  Speech is clear and goal oriented, follows commands Normal strength in upper and lower extremities bilaterally including dorsiflexion and plantar flexion, strong and equal grip strength Sensation normal to light and sharp touch Moves extremities without ataxia, coordination intact Normal gait Normal balance   Skin: Skin is warm and dry. No rash noted. She is not diaphoretic. No erythema.  Psychiatric: She has a normal mood and affect.    ED Course  Procedures (including critical care time) Labs Reviewed  CBC - Abnormal; Notable for the following:    WBC 13.4 (*)    Hemoglobin 11.2 (*)    HCT 33.8 (*)    All other components within normal limits  COMPREHENSIVE METABOLIC PANEL - Abnormal; Notable for the following:    Albumin 3.1 (*)    AST 59 (*)    ALT 68  (*)    Total Bilirubin 0.2 (*)    GFR calc non Af Amer 81 (*)    All other components within normal limits  URINALYSIS, ROUTINE W REFLEX MICROSCOPIC  PRO B NATRIURETIC PEPTIDE  POCT PREGNANCY, URINE   Dg Chest 2 View  10/09/2012   *RADIOLOGY REPORT*  Clinical Data: Shortness of breath  CHEST - 2 VIEW  Comparison: October 20, 2007.  Findings: Cardiomediastinal silhouette appears normal.  No acute pulmonary disease is noted.  Bony thorax is intact.  IMPRESSION: No acute cardiopulmonary abnormality seen.   Original Report Authenticated By: Lupita Raider.,  M.D.   ECG:  Date: 10/09/2012  Rate: 88  Rhythm: normal sinus rhythm  QRS Axis: normal  Intervals: normal  ST/T Wave abnormalities: normal  Conduction Disutrbances:none  Narrative Interpretation: nonischemic ECG, no old for comparison  Old EKG Reviewed: none available    1. Peripheral edema   2. Sciatica, right   3. GERD (gastroesophageal reflux disease)   4. Epigastric pain     MDM  [name] presents with multiple complaints, the most concerning to her is her peripheral edema. Pt also c/o return of her sciatic pain and epigastric abd pain after being out of her PPI for several weeks.    Pt given GI cocktail and Pepcid with complete resolution of abdominal pain.   Patient is nontoxic, nonseptic appearing, in no apparent distress.  Patient's pain and other symptoms adequately managed in emergency department.  Patient does not meet the SIRS or Sepsis criteria.  On repeat exam patient does not have a surgical abdomin and there are no peritoneal signs.  No indication of appendicitis, bowel obstruction, bowel perforation, cholecystitis, diverticulitis,  ectopic pregnancy.    Patient with back pain unchanged from her chronic sciatic pain.  No neurological deficits and normal neuro exam.  Patient can walk but states is painful.  No loss of bowel or bladder control.  No concern for cauda equina.  No fever, night sweats, weight loss, h/o  cancer, IVDU.    Pt also  with peripheral edema.  ECG nonischemic, chest x-ray without evidence of pulmonary edema.  Rhonchi heard in the bases with complete resolution after albuterol neb. Patient with mild leukocytosis 13.4 at baseline and mild anemia at 11.2 also baseline. Patient with normal BNP,  Swelling not likely of cardiac or pulmonary etiology d/t presentation, perc negative, VSS, no tracheal deviation, no JVD or new murmur, RRR, breath sounds equal bilaterally, EKG without acute abnormalities.  No alteration in kidney function and only mild elevation in ALT ans AST.  Pt drinking large volumes of [facility] each day along with a large dietary salt intake.  Discussed with patient the effect of her obesity, poor dietary choices and no cardiac activity on her overall health as well as her leg swelling. Recommend strict follows up with [facility].  Patient also given resource guide.  I explained the diagnosis and have given explicit precautions to return to the ER including for any other new or worsening symptoms. The patient understands and accepts the medical plan as it's been dictated and I have answered their questions. Discharge instructions concerning home care and prescriptions have been given. The patient is STABLE and is discharged to home in good condition.    Candace Client Beonka Amesquita, PA-C 10/10/12 [phone]

## 2012-10-09 NOTE — ED Notes (Signed)
Pt states she has chronic back pain.  Now she is having swelling in her feet and acute abdominal pain.  Pt is on prednisone for back pain on July 7.  Gained 16 lbs since the 7th.  Concerned for pregnancy.

## 2012-10-15 NOTE — ED Provider Notes (Signed)
Medical screening examination/treatment/procedure(s) were performed by non-physician practitioner and as supervising physician I was immediately available for consultation/collaboration.   Amanee Iacovelli M Shahana Capes, MD 10/15/12 2152 

## 2012-10-22 NOTE — ED Provider Notes (Signed)
Medical screening examination/treatment/procedure(s) were performed by non-physician practitioner and as supervising physician I was immediately available for consultation/collaboration.  Juliet Rude. Rubin Payor, MD 10/22/12 2042

## 2012-10-23 ENCOUNTER — Emergency Department (HOSPITAL_COMMUNITY)
Admission: EM | Admit: 2012-10-23 | Discharge: 2012-10-23 | Disposition: A | Discharge status: Home / Self Care | Payer: Medicaid Other | Attending: Emergency Medicine | Admitting: Emergency Medicine

## 2012-10-23 ENCOUNTER — Encounter (HOSPITAL_COMMUNITY): Payer: Self-pay | Admitting: Emergency Medicine

## 2012-10-23 DIAGNOSIS — B351 Tinea unguium: Secondary | ICD-10-CM | POA: Insufficient documentation

## 2012-10-23 DIAGNOSIS — M129 Arthropathy, unspecified: Secondary | ICD-10-CM | POA: Insufficient documentation

## 2012-10-23 DIAGNOSIS — M545 Low back pain, unspecified: Secondary | ICD-10-CM | POA: Insufficient documentation

## 2012-10-23 DIAGNOSIS — E669 Obesity, unspecified: Secondary | ICD-10-CM | POA: Insufficient documentation

## 2012-10-23 DIAGNOSIS — F17200 Nicotine dependence, unspecified, uncomplicated: Secondary | ICD-10-CM | POA: Insufficient documentation

## 2012-10-23 DIAGNOSIS — F411 Generalized anxiety disorder: Secondary | ICD-10-CM | POA: Insufficient documentation

## 2012-10-23 DIAGNOSIS — K649 Unspecified hemorrhoids: Secondary | ICD-10-CM | POA: Insufficient documentation

## 2012-10-23 DIAGNOSIS — Z8742 Personal history of other diseases of the female genital tract: Secondary | ICD-10-CM | POA: Insufficient documentation

## 2012-10-23 DIAGNOSIS — J45909 Unspecified asthma, uncomplicated: Secondary | ICD-10-CM | POA: Insufficient documentation

## 2012-10-23 DIAGNOSIS — Z79899 Other long term (current) drug therapy: Secondary | ICD-10-CM | POA: Insufficient documentation

## 2012-10-23 DIAGNOSIS — G8929 Other chronic pain: Secondary | ICD-10-CM | POA: Insufficient documentation

## 2012-10-23 DIAGNOSIS — K6289 Other specified diseases of anus and rectum: Secondary | ICD-10-CM | POA: Insufficient documentation

## 2012-10-23 DIAGNOSIS — Z8719 Personal history of other diseases of the digestive system: Secondary | ICD-10-CM | POA: Insufficient documentation

## 2012-10-23 DIAGNOSIS — G43909 Migraine, unspecified, not intractable, without status migrainosus: Secondary | ICD-10-CM | POA: Insufficient documentation

## 2012-10-23 DIAGNOSIS — M549 Dorsalgia, unspecified: Secondary | ICD-10-CM

## 2012-10-23 DIAGNOSIS — Z8739 Personal history of other diseases of the musculoskeletal system and connective tissue: Secondary | ICD-10-CM | POA: Insufficient documentation

## 2012-10-23 DIAGNOSIS — Z9889 Other specified postprocedural states: Secondary | ICD-10-CM | POA: Insufficient documentation

## 2012-10-23 DIAGNOSIS — Z91040 Latex allergy status: Secondary | ICD-10-CM | POA: Insufficient documentation

## 2012-10-23 MED ORDER — TERBINAFINE HCL 1 % EX CREA: TOPICAL_CREAM | Freq: Two times a day (BID) | CUTANEOUS | Status: DC

## 2012-10-23 MED ORDER — DICLOFENAC POTASSIUM 50 MG PO TABS: 50.0000 mg | ORAL_TABLET | Freq: Three times a day (TID) | ORAL | Status: DC

## 2012-10-23 MED ORDER — CYCLOBENZAPRINE HCL 10 MG PO TABS: 10.0000 mg | ORAL_TABLET | Freq: Three times a day (TID) | ORAL | Status: DC | PRN

## 2012-10-23 MED ORDER — POLYETHYLENE GLYCOL 3350 17 GM/SCOOP PO POWD: 17.0000 g | Freq: Every day | ORAL | Status: DC

## 2012-10-23 NOTE — ED Notes (Signed)
Discharge delayed due to appropriate discharge paperwork not being print out.

## 2012-10-23 NOTE — ED Provider Notes (Signed)
CSN: [id number]     Arrival date & time 10/23/12  1918 History  This chart was scribed for Ivonne Andrew, PA-C working with Loren Racer, MD by Greggory Stallion, ED scribe. This patient was seen in room WTR8/WTR8 and the patient's care was started at 8:13 PM.   Chief Complaint  Patient presents with  . Back Pain  . Rectal Pain   The history is provided by the patient. No language interpreter was used.    HPI Comments: History provided by the patient. [name] is a 27 y.o. female who presents to the Emergency Department complaining of gradual onset, constant lower back pain that started 2 days ago when she was lifting some furniture. Patient has history of chronic pain that is similar. There is no sniffing change her pain. In the past she occasionally has pain radiating with slight numbness to lower extremities however this is not present today. Pt states she has had hemorrhoids for about 1 week. She states she also has rectal pain with some intermittent bright red bleeding when she wipes. Pt states she took stool softeners and they irritated her even more. She states she also has fungus on her left great toe. Pt denies fever, chills, sweats, nausea, and emesis, numbness, urinary symptoms, urinary incontinence and bowel incontinence as associated symptoms. No other aggravating or alleviating factors. No other associated symptoms.  Past Medical History  Diagnosis Date  . Scoliosis   . Obesity   . Migraine   . Anxiety   . Gallstones   . Asthma   . Arthritis   . Pelvic inflammatory disease (PID) 05-08-11    previous hx. .Hx. childbirth x3-NVD   Past Surgical History  Procedure Laterality Date  . Abdominal exploration surgery  approx 27 years old    rlq(due to adhesions around ovaries)-appendix and ovaries remain  . Childbirth    . Cholecystectomy  05/10/2011    Procedure: LAPAROSCOPIC CHOLECYSTECTOMY WITH INTRAOPERATIVE CHOLANGIOGRAM;  Surgeon: Atilano Ina, MD,FACS;  Location: [facility];  Service: General;  Laterality: N/A;   Family History  Problem Relation Age of Onset  . Prostate cancer Maternal Grandfather   . Cancer Maternal Grandfather     prostate  . Colon polyps Maternal Grandmother   . Diabetes Mother   . Endometriosis Mother   . Cancer Mother     lung  . Diabetes Father   . Heart disease Father   . Cirrhosis Paternal Grandfather    History  Substance Use Topics  . Smoking status: Current Every Day Smoker -- 0.50 packs/day for 12 years    Types: Cigarettes  . Smokeless tobacco: Never Used  . Alcohol Use: Yes     Comment: rarely-social   OB History   Grav Para Term Preterm Abortions TAB SAB Ect Mult Living                 Review of Systems  Constitutional: Negative for fever.  Gastrointestinal: Positive for anal bleeding and rectal pain. Negative for abdominal pain and constipation.  Musculoskeletal: Positive for back pain.  Neurological: Negative for weakness and numbness.  All other systems reviewed and are negative.    Allergies  Latex  Home Medications   Current Outpatient Rx  Name  Route  Sig  Dispense  Refill  . albuterol (PROVENTIL HFA;VENTOLIN HFA) 108 (90 BASE) MCG/ACT inhaler   Inhalation   Inhale 2 puffs into the lungs every 6 (six) hours as needed for wheezing.          Marland Kitchen  Aspirin-Acetaminophen-Caffeine (GOODY HEADACHE PO)   Oral   Take 1 packet by mouth.         Marland Kitchen CALCIUM-VITAMIN D PO   Oral   Take 1 capsule by mouth daily.         . cyclobenzaprine (FLEXERIL) 10 MG tablet   Oral   Take 10 mg by mouth 3 (three) times daily as needed for muscle spasms (back pain).         Marland Kitchen diclofenac (CATAFLAM) 50 MG tablet   Oral   Take 50 mg by mouth 2 (two) times daily.         Marland Kitchen FLUoxetine (PROZAC) 20 MG capsule   Oral   Take 60 mg by mouth daily.         Marland Kitchen HYDROcodone-acetaminophen (NORCO/VICODIN) 5-325 MG per tablet   Oral   Take 1 tablet by mouth every 6 (six) hours as needed for pain.   16 tablet    0   . ibuprofen (ADVIL,MOTRIN) 200 MG tablet   Oral   Take 600-800 mg by mouth every 8 (eight) hours as needed for pain.         . Multiple Vitamins-Minerals (WOMENS ONE DAILY PO)   Oral   Take 1 capsule by mouth.         Marland Kitchen omeprazole (PRILOSEC) 20 MG capsule   Oral   Take 1 capsule (20 mg total) by mouth daily.   30 capsule   0   . traZODone (DESYREL) 50 MG tablet   Oral   Take 100 mg by mouth at bedtime.         Marland Kitchen zolpidem (AMBIEN) 10 MG tablet   Oral   Take 10 mg by mouth at bedtime as needed for sleep.         . medroxyPROGESTERone (DEPO-PROVERA) 150 MG/ML injection   Intramuscular   Inject 150 mg into the muscle every 3 (three) months.          BP 113/71  Pulse 106  Temp(Src) 98.5 F (36.9 C) (Oral)  Resp 22  Ht 5\' 3"  (1.6 m)  Wt 236 lb (107.049 kg)  BMI 41.82 kg/m2  SpO2 98%  Physical Exam  Nursing note and vitals reviewed. Constitutional: She is oriented to person, place, and time. She appears well-developed and well-nourished. No distress.  HENT:  Head: Normocephalic and atraumatic.  Eyes: EOM are normal.  Neck: Neck supple. No tracheal deviation present.  Cardiovascular: Normal rate, regular rhythm and normal heart sounds.   Pulmonary/Chest: Effort normal and breath sounds normal. No respiratory distress.  Genitourinary:  Chaperone present for rectal exam. Small hemorrhoid to the right lateral 3:00 position. No active bleeding. No significant thrombosis or tenderness. No fissures.  Musculoskeletal: Normal range of motion.       Back:  Tenderness at lower thoracic and lumbar regions. No deformities. No step offs. Patellar reflexes 2+. Sensation normal. Strength 5/5 in bilateral lower extremities.  Neurological: She is alert and oriented to person, place, and time. She has normal strength. No sensory deficit. Gait normal.  Reflex Scores:      Patellar reflexes are 2+ on the right side and 2+ on the left side. Skin: Skin is warm and dry.  Left  great toenail a little discolored and thickened.   Psychiatric: She has a normal mood and affect. Her behavior is normal.    ED Course   Procedures   DIAGNOSTIC STUDIES: Oxygen Saturation is 98% on RA, normal by my interpretation.  COORDINATION OF CARE: 8:44 PM-Discussed treatment plan which includes stool softeners for hemorrhoids with pt at bedside and pt agreed to plan.     1. Chronic back pain   2. Hemorrhoids     MDM  Patient seen and evaluated. Patient appears well no acute distress. No concerning or red flag symptoms. Back pain is chronic with exacerbation. Patient has good followup planned next week.     I personally performed the services described in this documentation, which was scribed in my presence. The recorded information has been reviewed and is accurate.   Angus Seller, PA-C 10/24/12 0025

## 2012-10-23 NOTE — ED Notes (Signed)
Pt states she has been moving furniture lately and has caused her to have low back pain and also caused her hemorrhoids to flare up  Pt states she is having rectal pain and and bleeding for that    Pt states she also has a fungus on her left big toe

## 2012-10-26 NOTE — ED Provider Notes (Signed)
Medical screening examination/treatment/procedure(s) were performed by non-physician practitioner and as supervising physician I was immediately available for consultation/collaboration.   Loren Racer, MD 10/26/12 [phone]

## 2012-10-28 ENCOUNTER — Ambulatory Visit: Payer: Medicaid Other | Attending: Family Medicine | Admitting: Family Medicine

## 2012-10-28 ENCOUNTER — Encounter: Payer: Self-pay | Admitting: Family Medicine

## 2012-10-28 VITALS — BP 106/75 | HR 98 | Temp 98.3°F | Resp 16 | Ht 63.0 in | Wt 251.0 lb

## 2012-10-28 DIAGNOSIS — M1990 Unspecified osteoarthritis, unspecified site: Secondary | ICD-10-CM

## 2012-10-28 DIAGNOSIS — G894 Chronic pain syndrome: Secondary | ICD-10-CM | POA: Insufficient documentation

## 2012-10-28 DIAGNOSIS — M542 Cervicalgia: Secondary | ICD-10-CM | POA: Insufficient documentation

## 2012-10-28 DIAGNOSIS — M549 Dorsalgia, unspecified: Secondary | ICD-10-CM | POA: Insufficient documentation

## 2012-10-28 LAB — CBC
HCT: 34.9 % — ABNORMAL LOW (ref 36.0–46.0)
MCH: 27.4 pg (ref 26.0–34.0)
MCV: 83.1 fL (ref 78.0–100.0)
RBC: 4.2 MIL/uL (ref 3.87–5.11)
RDW: 14.4 % (ref 11.5–15.5)
WBC: 8.9 10*3/uL (ref 4.0–10.5)

## 2012-10-28 MED ORDER — CYCLOBENZAPRINE HCL 5 MG PO TABS: 5.0000 mg | ORAL_TABLET | Freq: Three times a day (TID) | ORAL | Status: DC | PRN

## 2012-10-28 MED ORDER — DICLOFENAC POTASSIUM 50 MG PO TABS: 50.0000 mg | ORAL_TABLET | Freq: Three times a day (TID) | ORAL | Status: DC | PRN

## 2012-10-28 NOTE — Patient Instructions (Addendum)
Back Exercises Back exercises help treat and prevent back injuries. The goal of back exercises is to increase the strength of your abdominal and back muscles and the flexibility of your back. These exercises should be started when you no longer have back pain. Back exercises include:  Pelvic Tilt. Lie on your back with your knees bent. Tilt your pelvis until the lower part of your back is against the floor. Hold this position 5 to 10 sec and repeat 5 to 10 times.  Knee to Chest. Pull first 1 knee up against your chest and hold for 20 to 30 seconds, repeat this with the other knee, and then both knees. This may be done with the other leg straight or bent, whichever feels better.  Sit-Ups or Curl-Ups. Bend your knees 90 degrees. Start with tilting your pelvis, and do a partial, slow sit-up, lifting your trunk only 30 to 45 degrees off the floor. Take at least 2 to 3 seconds for each sit-up. Do not do sit-ups with your knees out straight. If partial sit-ups are difficult, simply do the above but with only tightening your abdominal muscles and holding it as directed.  Hip-Lift. Lie on your back with your knees flexed 90 degrees. Push down with your feet and shoulders as you raise your hips a couple inches off the floor; hold for 10 seconds, repeat 5 to 10 times.  Back arches. Lie on your stomach, propping yourself up on bent elbows. Slowly press on your hands, causing an arch in your low back. Repeat 3 to 5 times. Any initial stiffness and discomfort should lessen with repetition over time.  Shoulder-Lifts. Lie face down with arms beside your body. Keep hips and torso pressed to floor as you slowly lift your head and shoulders off the floor. Do not overdo your exercises, especially in the beginning. Exercises may cause you some mild back discomfort which lasts for a few minutes; however, if the pain is more severe, or lasts for more than 15 minutes, do not continue exercises until you see your caregiver.  Improvement with exercise therapy for back problems is slow.  See your caregivers for assistance with developing a proper back exercise program. Document Released: 04/19/2004 Document Revised: 06/04/2011 Document Reviewed: 01/11/2011 [facility] Patient Information 2014 [place], [place]. Lumbosacral Strain Lumbosacral strain is one of the most common causes of back pain. There are many causes of back pain. Most are not serious conditions. CAUSES  Your backbone (spinal column) is made up of 24 main vertebral bodies, the sacrum, and the coccyx. These are held together by muscles and tough, fibrous tissue (ligaments). Nerve roots pass through the openings between the vertebrae. A sudden move or injury to the back may cause injury to, or pressure on, these nerves. This may result in localized back pain or pain movement (radiation) into the buttocks, down the leg, and into the foot. Sharp, shooting pain from the buttock down the back of the leg (sciatica) is frequently associated with a ruptured (herniated) disk. Pain may be caused by muscle spasm alone. Your caregiver can often find the cause of your pain by the details of your symptoms and an exam. In some cases, you may need tests (such as X-rays). Your caregiver will work with you to decide if any tests are needed based on your specific exam. HOME CARE INSTRUCTIONS   Avoid an underactive lifestyle. Active exercise, as directed by your caregiver, is your greatest weapon against back pain.  Avoid hard physical activities (tennis, racquetball,  waterskiing) if you are not in proper physical condition for it. This may aggravate or create problems.  If you have a back problem, avoid sports requiring sudden body movements. Swimming and walking are generally safer activities.  Maintain good posture.  Avoid becoming overweight (obese).  Use bed rest for only the most extreme, sudden (acute) episode. Your caregiver will help you determine how much bed rest is  necessary.  For acute conditions, you may put ice on the injured area.  Put ice in a plastic bag.  Place a towel between your skin and the bag.  Leave the ice on for 15-20 minutes at a time, every 2 hours, or as needed.  After you are improved and more active, it may help to apply heat for 30 minutes before activities. See your caregiver if you are having pain that lasts longer than expected. Your caregiver can advise appropriate exercises or therapy if needed. With conditioning, most back problems can be avoided. SEEK IMMEDIATE MEDICAL CARE IF:   You have numbness, tingling, weakness, or problems with the use of your arms or legs.  You experience severe back pain not relieved with medicines.  There is a change in bowel or bladder control.  You have increasing pain in any area of the body, including your belly (abdomen).  You notice shortness of breath, dizziness, or feel faint.  You feel sick to your stomach (nauseous), are throwing up (vomiting), or become sweaty.  You notice discoloration of your toes or legs, or your feet get very cold.  Your back pain is getting worse.  You have a fever. MAKE SURE YOU:   Understand these instructions.  Will watch your condition.  Will get help right away if you are not doing well or get worse. Document Released: 12/20/2004 Document Revised: 06/04/2011 Document Reviewed: 06/11/2008 [facility] Patient Information 2014 [place], [place].

## 2012-10-28 NOTE — Progress Notes (Signed)
Patient ID: [name], female   DOB: Mar 20, 1986, 27 y.o.   MRN: [id number]  CC:  Establish Care   HPI: Pt has had chronic low back pain.  Pt has been on pain meds for many years.  She recently saw an orthopedist and was prescribed to get physical therapy.  She was also seen in the ER 4 times in the last month for various ailments.  She is reporting that she is managed primarily with diclofenac and with flexeril.  She says that she had been going to [facility] in the past but it has closed down recently.  Her pain is in her neck and lower back and has arthritis pain in hands and arms.  She has had multiple fractures in the past.  Pt is a former smoker but reported that she did quit.   Allergies  Allergen Reactions  . Latex Itching   Past Medical History  Diagnosis Date  . Scoliosis   . Obesity   . Migraine   . Anxiety   . Gallstones   . Asthma   . Arthritis   . Pelvic inflammatory disease (PID) 05-08-11    previous hx. .Hx. childbirth x3-NVD   Current Outpatient Prescriptions on File Prior to Visit  Medication Sig Dispense Refill  . albuterol (PROVENTIL HFA;VENTOLIN HFA) 108 (90 BASE) MCG/ACT inhaler Inhale 2 puffs into the lungs every 6 (six) hours as needed for wheezing.       Marland Kitchen CALCIUM-VITAMIN D PO Take 1 capsule by mouth daily.      Marland Kitchen FLUoxetine (PROZAC) 20 MG capsule Take 60 mg by mouth daily.      . medroxyPROGESTERone (DEPO-PROVERA) 150 MG/ML injection Inject 150 mg into the muscle every 3 (three) months.      . Multiple Vitamins-Minerals (WOMENS ONE DAILY PO) Take 1 capsule by mouth.      Marland Kitchen omeprazole (PRILOSEC) 20 MG capsule Take 1 capsule (20 mg total) by mouth daily.  30 capsule  0  . traZODone (DESYREL) 50 MG tablet Take 100 mg by mouth at bedtime.      Marland Kitchen zolpidem (AMBIEN) 10 MG tablet Take 10 mg by mouth at bedtime as needed for sleep.      . Aspirin-Acetaminophen-Caffeine (GOODY HEADACHE PO) Take 1 packet by mouth.      Marland Kitchen HYDROcodone-acetaminophen  (NORCO/VICODIN) 5-325 MG per tablet Take 1 tablet by mouth every 6 (six) hours as needed for pain.  16 tablet  0  . polyethylene glycol powder (GLYCOLAX/MIRALAX) powder Take 17 g by mouth daily.  255 g  0  . terbinafine (LAMISIL AT) 1 % cream Apply topically 2 (two) times daily.  30 g  0   No current facility-administered medications on file prior to visit.   Family History  Problem Relation Age of Onset  . Prostate cancer Maternal Grandfather   . Cancer Maternal Grandfather     prostate  . Colon polyps Maternal Grandmother   . Diabetes Mother   . Endometriosis Mother   . Cancer Mother     lung  . Diabetes Father   . Heart disease Father   . Cirrhosis Paternal Grandfather    History   Social History  . Marital Status: Single    Spouse Name: N/A    Number of Children: 3  . Years of Education: N/A   Occupational History  . UNEMPLOYED    Social History Main Topics  . Smoking status: Current Every Day Smoker -- 0.50 packs/day for 12 years  Types: Cigarettes  . Smokeless tobacco: Never Used  . Alcohol Use: Yes     Comment: rarely-social  . Drug Use: No     Comment: former has not used in last 30 days  . Sexually Active: Yes   Other Topics Concern  . Not on file   Social History Narrative  . No narrative on file    Review of Systems  Constitutional: Negative for fever, chills, diaphoresis, activity change, appetite change and fatigue.  HENT: Negative for ear pain, nosebleeds, congestion, facial swelling, rhinorrhea, neck pain, neck stiffness and ear discharge.   Eyes: Negative for pain, discharge, redness, itching and visual disturbance.  Respiratory: Negative for cough, choking, chest tightness, shortness of breath, wheezing and stridor.   Cardiovascular: Negative for chest pain, palpitations and leg swelling.  Gastrointestinal: Negative for abdominal distention.  Genitourinary: Negative for dysuria, urgency, frequency, hematuria, flank pain, decreased urine  volume, difficulty urinating and dyspareunia.  Musculoskeletal: tnderness in neck and lower back   Neurological: Negative for dizziness, tremors, seizures, syncope, facial asymmetry, speech difficulty, weakness, light-headedness, numbness and headaches.  Hematological: Negative for adenopathy. Does not bruise/bleed easily.  Psychiatric/Behavioral: Negative for hallucinations, behavioral problems, confusion, dysphoric mood, decreased concentration and agitation.    Objective:   Filed Vitals:   10/28/12 1708  BP: 106/75  Pulse: 98  Temp: 98.3 F (36.8 C)  Resp: 16    Physical Exam  Constitutional: Appears well-developed and well-nourished. No distress.  HENT: Normocephalic. External right and left ear normal. Oropharynx is clear and moist.  Eyes: Conjunctivae and EOM are normal. PERRLA, no scleral icterus.  Neck: Normal ROM. Neck supple. No JVD. No tracheal deviation. No thyromegaly.  CVS: RRR, S1/S2 +, no murmurs, no gallops, no carotid bruit.  Pulmonary: Effort and breath sounds normal, no stridor, rhonchi, wheezes, rales.  Abdominal: Soft. BS +,  no distension, tenderness, rebound or guarding.  Musculoskeletal: Normal range of motion. No edema,  Tenderness in cervical and lumbar spine with palpation.  Lymphadenopathy: No lymphadenopathy noted, cervical, inguinal. Neuro: Alert. Normal reflexes, muscle tone coordination. No cranial nerve deficit. Skin: Skin is warm and dry. No rash noted. Not diaphoretic. No erythema. No pallor.  Psychiatric: Normal mood and affect. Behavior, judgment, thought content normal.   Lab Results  Component Value Date   WBC 13.4* 10/09/2012   HGB 11.2* 10/09/2012   HCT 33.8* 10/09/2012   MCV 85.4 10/09/2012   PLT 269 10/09/2012   Lab Results  Component Value Date   CREATININE 0.95 10/09/2012   BUN 14 10/09/2012   NA 136 10/09/2012   K 4.3 10/09/2012   CL 101 10/09/2012   CO2 26 10/09/2012    No results found for this basename: HGBA1C   Lipid Panel   No results found for this basename: chol, trig, hdl, cholhdl, vldl, ldlcalc       Assessment and plan:   Patient Active Problem List   Diagnosis Date Noted  . Obesity   . Migraine   . Asthma    Chronic pain syndrome - Plan: Ambulatory referral to Pain Clinic, DG Cervical Spine Complete, DG Thoracic Spine 2 View, DG Lumbar Spine Complete, COMPLETE METABOLIC PANEL WITH GFR, CBC, Sedimentation Rate, Rheumatoid factor, Ambulatory referral to Sports Medicine  Neck pain - Plan: DG Cervical Spine Complete, DG Thoracic Spine 2 View, DG Lumbar Spine Complete, COMPLETE METABOLIC PANEL WITH GFR, CBC, Sedimentation Rate, Rheumatoid factor, Ambulatory referral to Sports Medicine  Back pain - Plan: DG Cervical Spine Complete, DG Thoracic  Spine 2 View, DG Lumbar Spine Complete, COMPLETE METABOLIC PANEL WITH GFR, CBC, Sedimentation Rate, Rheumatoid factor, Ambulatory referral to Sports Medicine  Osteoarthritis - Plan: COMPLETE METABOLIC PANEL WITH GFR, CBC, Sedimentation Rate, Rheumatoid factor, Ambulatory referral to Sports Medicine  Refilled diclofenac and flexeril to use as needed for chronic pain symptoms   Follow up with physical therapy as ordered by her orthopedist - she is to start TENS units and other therapies.  Obtain Medical Records  Referrals as above  RTC in 3 weeks  The patient was given clear instructions to go to ER or return to medical center if symptoms don't improve, worsen or new problems develop.  The patient verbalized understanding.  The patient was told to call to get any lab results if not heard anything in the next week.    Rodney Langton, MD, CDE, FAAFP [facility] [place], [place]

## 2012-10-28 NOTE — Progress Notes (Signed)
PT HERE TO ESTABLISH CARE FOR CHRONIC BACK PAIN.STATES SHE WAS SEEN AT [facility] YESTERDAY AND TOLD TO F/U WITH PCP AND PT. C/O LOWER BACK CONSTANT PAIN RADIATING UP TO SHOULDERS AND WEAKNESS IN BOTH ARMS X 5 YRS. DENIES INJURY. FORMER DRUG ADDICT X 30 DYS CLEAN.

## 2012-10-29 ENCOUNTER — Telehealth: Payer: Self-pay

## 2012-10-29 LAB — COMPLETE METABOLIC PANEL WITH GFR
ALT: 18 U/L (ref 0–35)
AST: 17 U/L (ref 0–37)
Albumin: 3.6 g/dL (ref 3.5–5.2)
Alkaline Phosphatase: 63 U/L (ref 39–117)
BUN: 13 mg/dL (ref 6–23)
Calcium: 8.7 mg/dL (ref 8.4–10.5)
Chloride: 111 mEq/L (ref 96–112)
Potassium: 4.2 mEq/L (ref 3.5–5.3)

## 2012-10-29 NOTE — Progress Notes (Signed)
Quick Note:  Please inform patient that her labs reveal mild anemia. Her rheumatoid factor was negative. Her sed rate was mildly elevated (test for inflammation). Other labs came back OK.   Rodney Langton, MD, CDE, FAAFP [facility] [place], [place]   ______

## 2012-10-29 NOTE — Telephone Encounter (Signed)
Message copied by Lestine Mount on Wed Oct 29, 2012  3:15 PM ------      Message from: Cleora Fleet      Created: Wed Oct 29, 2012  7:35 AM       Please inform patient that her labs reveal mild anemia.  Her rheumatoid factor was negative.  Her sed rate was mildly elevated (test for inflammation).  Other labs came back OK.             Rodney Langton, MD, CDE, FAAFP      [facility]      [facility]      [place], [place]        ------

## 2012-10-29 NOTE — Telephone Encounter (Signed)
Left message on machine to return call. 

## 2012-10-30 ENCOUNTER — Telehealth: Payer: Self-pay | Admitting: Emergency Medicine

## 2012-10-30 NOTE — Telephone Encounter (Signed)
Left a message with pt mother. Pt scheduled to have imaging done tomorrow. Will retry later

## 2012-10-31 ENCOUNTER — Inpatient Hospital Stay (HOSPITAL_COMMUNITY): Admission: RE | Admit: 2012-10-31 | Payer: Medicaid Other | Source: Ambulatory Visit

## 2012-11-01 ENCOUNTER — Other Ambulatory Visit: Payer: Self-pay | Admitting: Family Medicine

## 2012-11-07 ENCOUNTER — Ambulatory Visit: Payer: Medicaid Other | Attending: Internal Medicine | Admitting: Internal Medicine

## 2012-11-07 VITALS — BP 109/78 | HR 106 | Temp 98.1°F | Ht 63.0 in | Wt 245.6 lb

## 2012-11-07 DIAGNOSIS — M542 Cervicalgia: Secondary | ICD-10-CM

## 2012-11-07 MED ORDER — FLUTICASONE PROPIONATE 50 MCG/ACT NA SUSP: 2.0000 | Freq: Every day | NASAL | Status: DC

## 2012-11-07 MED ORDER — METHYLPREDNISOLONE (PAK) 4 MG PO TABS: ORAL_TABLET | ORAL | Status: DC

## 2012-11-07 MED ORDER — AZITHROMYCIN 500 MG PO TABS: 500.0000 mg | ORAL_TABLET | Freq: Every day | ORAL | Status: AC

## 2012-11-07 NOTE — Progress Notes (Unsigned)
Patient ID: [name], female   DOB: 12/29/1985, 27 y.o.   MRN: [id number]  CC:  HPI: 27 year old female who presents to the clinic for URI like symptoms. She complains of ear fullness in the right ear. She also complains of sore throat and pain below the right mandible. She denies any chest pain, shortness of breath. She continues to smoke although she has cut down to 1-2 cigarettes a day She states that her daughter was 30 years old had a URI   Allergies  Allergen Reactions  . Latex Itching   Past Medical History  Diagnosis Date  . Scoliosis   . Obesity   . Migraine   . Anxiety   . Gallstones   . Asthma   . Arthritis   . Pelvic inflammatory disease (PID) 05-08-11    previous hx. .Hx. childbirth x3-NVD   Current Outpatient Prescriptions on File Prior to Visit  Medication Sig Dispense Refill  . albuterol (PROVENTIL HFA;VENTOLIN HFA) 108 (90 BASE) MCG/ACT inhaler Inhale 2 puffs into the lungs every 6 (six) hours as needed for wheezing.       . Aspirin-Acetaminophen-Caffeine (GOODY HEADACHE PO) Take 1 packet by mouth.      Marland Kitchen CALCIUM-VITAMIN D PO Take 1 capsule by mouth daily.      . cyclobenzaprine (FLEXERIL) 5 MG tablet Take 1 tablet (5 mg total) by mouth 3 (three) times daily as needed for muscle spasms.  30 tablet  2  . diclofenac (CATAFLAM) 50 MG tablet Take 1 tablet (50 mg total) by mouth 3 (three) times daily as needed (pain and inflammation).  30 tablet  1  . FLUoxetine (PROZAC) 20 MG capsule Take 60 mg by mouth daily.      Marland Kitchen HYDROcodone-acetaminophen (NORCO/VICODIN) 5-325 MG per tablet Take 1 tablet by mouth every 6 (six) hours as needed for pain.  16 tablet  0  . medroxyPROGESTERone (DEPO-PROVERA) 150 MG/ML injection Inject 150 mg into the muscle every 3 (three) months.      . Multiple Vitamins-Minerals (WOMENS ONE DAILY PO) Take 1 capsule by mouth.      Marland Kitchen omeprazole (PRILOSEC) 20 MG capsule Take 1 capsule (20 mg total) by mouth daily.  30 capsule  0  . polyethylene  glycol powder (GLYCOLAX/MIRALAX) powder Take 17 g by mouth daily.  255 g  0  . terbinafine (LAMISIL AT) 1 % cream Apply topically 2 (two) times daily.  30 g  0  . traZODone (DESYREL) 50 MG tablet Take 100 mg by mouth at bedtime.      Marland Kitchen zolpidem (AMBIEN) 10 MG tablet Take 10 mg by mouth at bedtime as needed for sleep.       No current facility-administered medications on file prior to visit.   Family History  Problem Relation Age of Onset  . Prostate cancer Maternal Grandfather   . Cancer Maternal Grandfather     prostate  . Colon polyps Maternal Grandmother   . Diabetes Mother   . Endometriosis Mother   . Cancer Mother     lung  . Diabetes Father   . Heart disease Father   . Cirrhosis Paternal Grandfather    History   Social History  . Marital Status: Single    Spouse Name: N/A    Number of Children: 3  . Years of Education: N/A   Occupational History  . UNEMPLOYED    Social History Main Topics  . Smoking status: Current Every Day Smoker -- 0.50 packs/day for 12 years  Types: Cigarettes  . Smokeless tobacco: Never Used  . Alcohol Use: Yes     Comment: rarely-social  . Drug Use: No     Comment: former has not used in last 30 days  . Sexual Activity: Yes   Other Topics Concern  . Not on file   Social History Narrative  . No narrative on file    Review of Systems  Constitutional: Negative for fever, chills, diaphoresis, activity change, appetite change and fatigue.  HENT: Negative for ear pain, nosebleeds, congestion, facial swelling, rhinorrhea, neck pain, neck stiffness and ear discharge.   Eyes: Negative for pain, discharge, redness, itching and visual disturbance.  Respiratory: Negative for cough, choking, chest tightness, shortness of breath, wheezing and stridor.   Cardiovascular: Negative for chest pain, palpitations and leg swelling.  Gastrointestinal: Negative for abdominal distention.  Genitourinary: Negative for dysuria, urgency, frequency, hematuria,  flank pain, decreased urine volume, difficulty urinating and dyspareunia.  Musculoskeletal: Negative for back pain, joint swelling, arthralgias and gait problem.  Neurological: Negative for dizziness, tremors, seizures, syncope, facial asymmetry, speech difficulty, weakness, light-headedness, numbness and headaches.  Hematological: Negative for adenopathy. Does not bruise/bleed easily.  Psychiatric/Behavioral: Negative for hallucinations, behavioral problems, confusion, dysphoric mood, decreased concentration and agitation.    Objective:   Filed Vitals:   11/07/12 1614  BP: 109/78  Pulse: 106  Temp: 98.1 F (36.7 C)    Physical Exam  Constitutional: Appears well-developed and well-nourished. No distress.  HENT: Normocephalic. External right and left ear normal. Oropharynx is clear and moist.  Eyes: Conjunctivae and EOM are normal. PERRLA, no scleral icterus.  Neck: Normal ROM. Neck supple. No JVD. No tracheal deviation. No thyromegaly.  CVS: RRR, S1/S2 +, no murmurs, no gallops, no carotid bruit.  Pulmonary: Effort and breath sounds normal, no stridor, rhonchi, wheezes, rales.  Abdominal: Soft. BS +,  no distension, tenderness, rebound or guarding.  Musculoskeletal: Normal range of motion. No edema and no tenderness.  Lymphadenopathy: No lymphadenopathy noted, cervical, inguinal. Neuro: Alert. Normal reflexes, muscle tone coordination. No cranial nerve deficit. Skin: Skin is warm and dry. No rash noted. Not diaphoretic. No erythema. No pallor.  Psychiatric: Normal mood and affect. Behavior, judgment, thought content normal.   Lab Results  Component Value Date   WBC 8.9 10/28/2012   HGB 11.5* 10/28/2012   HCT 34.9* 10/28/2012   MCV 83.1 10/28/2012   PLT 269 10/28/2012   Lab Results  Component Value Date   CREATININE 0.77 10/28/2012   BUN 13 10/28/2012   NA 140 10/28/2012   K 4.2 10/28/2012   CL 111 10/28/2012   CO2 21 10/28/2012    No results found for this basename: HGBA1C   Lipid Panel   No results found for this basename: chol, trig, hdl, cholhdl, vldl, ldlcalc       Assessment and plan:   Patient Active Problem List   Diagnosis Date Noted  . Chronic pain syndrome 10/28/2012  . Neck pain 10/28/2012  . Back pain 10/28/2012  . Osteoarthritis 10/28/2012  . Obesity   . Migraine   . Asthma        Acute pharyngitis most likely strep pharyngitis Azithromycin 500 mg by mouth daily for 7 days Flonase Continue to use albuterol inhaler Medrol Dosepak Advice to quit smoking  Followup when necessary

## 2012-11-12 ENCOUNTER — Other Ambulatory Visit: Payer: Self-pay | Admitting: Internal Medicine

## 2012-11-13 NOTE — Telephone Encounter (Signed)
Medication refill-prilosec 

## 2012-11-14 ENCOUNTER — Ambulatory Visit (INDEPENDENT_AMBULATORY_CARE_PROVIDER_SITE_OTHER): Payer: Medicaid Other | Admitting: Family Medicine

## 2012-11-14 ENCOUNTER — Ambulatory Visit
Admission: RE | Admit: 2012-11-14 | Discharge: 2012-11-14 | Disposition: A | Discharge status: Home / Self Care | Payer: Medicaid Other | Source: Ambulatory Visit | Attending: Family Medicine | Admitting: Family Medicine

## 2012-11-14 ENCOUNTER — Other Ambulatory Visit: Payer: Self-pay | Admitting: Family Medicine

## 2012-11-14 VITALS — BP 113/79 | Ht 63.0 in | Wt 246.0 lb

## 2012-11-14 DIAGNOSIS — M542 Cervicalgia: Secondary | ICD-10-CM

## 2012-11-14 DIAGNOSIS — M549 Dorsalgia, unspecified: Secondary | ICD-10-CM

## 2012-11-14 DIAGNOSIS — R200 Anesthesia of skin: Secondary | ICD-10-CM | POA: Insufficient documentation

## 2012-11-14 DIAGNOSIS — G894 Chronic pain syndrome: Secondary | ICD-10-CM

## 2012-11-14 DIAGNOSIS — R209 Unspecified disturbances of skin sensation: Secondary | ICD-10-CM

## 2012-11-14 MED ORDER — DIAZEPAM 5 MG PO TABS: ORAL_TABLET | ORAL | Status: DC
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
## 2012-11-14 NOTE — Assessment & Plan Note (Signed)
MRI of the cervical spine was ordered today. She'll followup in the office after the MRI to discuss results and further intervention at that time.

## 2012-11-16 NOTE — Progress Notes (Signed)
Quick Note:  Please inform patient that xray of lumbar spine came back with no abnormalities seen.   Rodney Langton, MD, CDE, FAAFP [facility] [place], [place]   ______

## 2012-11-16 NOTE — Progress Notes (Signed)
Quick Note:  Please inform patient that xray of lumbar spine came back normal with no abnormalities seen.   Rodney Langton, MD, CDE, FAAFP [facility] [place], [place]   ______

## 2012-11-17 ENCOUNTER — Telehealth: Payer: Self-pay

## 2012-11-17 NOTE — Telephone Encounter (Signed)
Message copied by Lestine Mount on Mon Nov 17, 2012  8:48 AM ------      Message from: Cleora Fleet      Created: Sun Nov 16, 2012  1:32 PM       Please inform patient that xray of lumbar spine came back normal with no abnormalities seen.             Rodney Langton, MD, CDE, FAAFP      [facility]      [facility]      [place], [place]        ------

## 2012-11-17 NOTE — Telephone Encounter (Signed)
Patient is aware of her x ray results 

## 2012-11-18 ENCOUNTER — Other Ambulatory Visit: Payer: Medicaid Other

## 2012-11-18 ENCOUNTER — Ambulatory Visit: Payer: Medicaid Other

## 2012-11-18 NOTE — Progress Notes (Signed)
[facility]: Attending Note: I have reviewed the chart, discussed wit the Sports Medicine Fellow. I agree with assessment and treatment plan as detailed in the Fellow's note.  

## 2012-11-26 ENCOUNTER — Encounter: Payer: Self-pay | Admitting: Family Medicine

## 2012-11-27 ENCOUNTER — Ambulatory Visit
Admission: RE | Admit: 2012-11-27 | Discharge: 2012-11-27 | Disposition: A | Discharge status: Home / Self Care | Payer: Medicaid Other | Source: Ambulatory Visit | Attending: Family Medicine | Admitting: Family Medicine

## 2012-11-27 DIAGNOSIS — R200 Anesthesia of skin: Secondary | ICD-10-CM

## 2012-11-27 DIAGNOSIS — M542 Cervicalgia: Secondary | ICD-10-CM

## 2012-12-01 ENCOUNTER — Ambulatory Visit: Payer: Medicaid Other | Attending: Family Medicine | Admitting: Internal Medicine

## 2012-12-01 VITALS — BP 107/75 | HR 97 | Temp 98.2°F | Resp 16 | Wt 261.6 lb

## 2012-12-01 DIAGNOSIS — M542 Cervicalgia: Secondary | ICD-10-CM

## 2012-12-01 LAB — CBC
HCT: 33.9 % — ABNORMAL LOW (ref 36.0–46.0)
MCH: 28.1 pg (ref 26.0–34.0)
MCV: 82.9 fL (ref 78.0–100.0)
RBC: 4.09 MIL/uL (ref 3.87–5.11)
WBC: 12.2 10*3/uL — ABNORMAL HIGH (ref 4.0–10.5)

## 2012-12-01 LAB — LIPID PANEL
HDL: 39 mg/dL — ABNORMAL LOW (ref >39)
LDL Cholesterol: 105 mg/dL — ABNORMAL HIGH (ref 0–99)
Total CHOL/HDL Ratio: 5 Ratio
VLDL: 50 mg/dL — ABNORMAL HIGH (ref 0–40)

## 2012-12-01 LAB — POCT GLYCOSYLATED HEMOGLOBIN (HGB A1C): Hemoglobin A1C: 5.2

## 2012-12-01 MED ORDER — IBUPROFEN 800 MG PO TABS: 800.0000 mg | ORAL_TABLET | Freq: Two times a day (BID) | ORAL | Status: DC | PRN

## 2012-12-01 NOTE — Progress Notes (Unsigned)
Patient needs medication refill Needs note for work not to go back till the 15th  When she sees specialist

## 2012-12-01 NOTE — Progress Notes (Unsigned)
Patient ID: [name], female   DOB: 07/22/1985, 27 y.o.   MRN: [id number]  Patient Demographics  [name], is a 27 y.o. female  [id number]  [id number]  DOB - 1986-03-25  Chief Complaint  Patient presents with  . Medication Refill        Subjective:   [name] with History of morbid obesity, neck and right arm pain which has been ongoing for the last few months comes here to followup on her MRI result which was ordered in this clinic a few weeks ago, she continues to have neck discomfort along with tingling and numbness mostly to her right arm, denies any fever chills denies any IV drug use, denies any neck injuries.     Denies any subjective complaints except as above, no active headache, no chest abdominal pain at this time, not short of breath. No focal weakness which is new.    Objective:    Patient Active Problem List   Diagnosis Date Noted  . Numbness of arm 11/14/2012  . Chronic pain syndrome 10/28/2012  . Neck pain 10/28/2012  . Back pain 10/28/2012  . Osteoarthritis 10/28/2012  . Obesity   . Migraine   . Asthma      Filed Vitals:   12/01/12 1423  BP: 107/75  Pulse: 97  Temp: 98.2 F (36.8 C)  Resp: 16  Weight: 261 lb 9.6 oz (118.661 kg)  SpO2: 100%     Exam   Awake Alert, Oriented X 3, No new F.N deficits, Normal affect [place].AT,PERRAL, C-spine movement is still intact and appears to be normal, no point tenderness on examination, grip strength is 5 x 5 in both upper extremities, biceps and triceps reflex is 1+ and bilaterally symmetrical. Supple Neck,No JVD, No cervical lymphadenopathy appriciated.  Symmetrical Chest wall movement, Good air movement bilaterally, CTAB RRR,No Gallops,Rubs or new Murmurs, No Parasternal Heave +ve B.Sounds, Abd Soft, Non tender, No organomegaly appriciated, No rebound - guarding or rigidity. No Cyanosis, Clubbing or edema, No new Rash or bruise      Data Review   CBC No results found for this  basename: WBC, HGB, HCT, PLT, MCV, MCH, MCHC, RDW, NEUTRABS, LYMPHSABS, MONOABS, EOSABS, BASOSABS, BANDABS, BANDSABD,  in the last 168 hours  Chemistries   No results found for this basename: NA, K, CL, CO2, GLUCOSE, BUN, CREATININE, GFRCGP, CALCIUM, MG, AST, ALT, ALKPHOS, BILITOT,  in the last 168 hours ------------------------------------------------------------------------------------------------------------------ No results found for this basename: HGBA1C,  in the last 72 hours ------------------------------------------------------------------------------------------------------------------ No results found for this basename: CHOL, HDL, LDLCALC, TRIG, CHOLHDL, LDLDIRECT,  in the last 72 hours ------------------------------------------------------------------------------------------------------------------ No results found for this basename: TSH, T4TOTAL, FREET3, T3FREE, THYROIDAB,  in the last 72 hours ------------------------------------------------------------------------------------------------------------------ No results found for this basename: VITAMINB12, FOLATE, FERRITIN, TIBC, IRON, RETICCTPCT,  in the last 72 hours  Coagulation profile  No results found for this basename: INR, PROTIME,  in the last 168 hours     Prior to Admission medications   Medication Sig Start Date End Date Taking? Authorizing Provider  albuterol (PROVENTIL HFA;VENTOLIN HFA) 108 (90 BASE) MCG/ACT inhaler Inhale 2 puffs into the lungs every 6 (six) hours as needed for wheezing.     Historical Provider, MD  Aspirin-Acetaminophen-Caffeine (GOODY HEADACHE PO) Take 1 packet by mouth.    Historical Provider, MD  CALCIUM-VITAMIN D PO Take 1 capsule by mouth daily.    Historical Provider, MD  cyclobenzaprine (FLEXERIL) 5 MG tablet Take 1 tablet (5 mg total) by mouth 3 (  three) times daily as needed for muscle spasms. 10/28/12   Clanford Cyndie Mull, MD  diazepam (VALIUM) 5 MG tablet Take one by mouth thirty minutes  prior to MRI 11/14/12   Nestor Ramp, MD  diclofenac (CATAFLAM) 50 MG tablet Take 1 tablet (50 mg total) by mouth 3 (three) times daily as needed (pain and inflammation). 10/28/12   Clanford Cyndie Mull, MD  FLUoxetine (PROZAC) 20 MG capsule Take 60 mg by mouth daily.    Historical Provider, MD  fluticasone (FLONASE) 50 MCG/ACT nasal spray Place 2 sprays into the nose daily. 11/07/12   Richarda Overlie, MD  HYDROcodone-acetaminophen (NORCO/VICODIN) 5-325 MG per tablet Take 1 tablet by mouth every 6 (six) hours as needed for pain. 09/30/12   Antony Madura, PA-C  medroxyPROGESTERone (DEPO-PROVERA) 150 MG/ML injection Inject 150 mg into the muscle every 3 (three) months.    Historical Provider, MD  methylPREDNIsolone (MEDROL DOSPACK) 4 MG tablet follow package directions 11/07/12   Richarda Overlie, MD  Multiple Vitamins-Minerals (WOMENS ONE DAILY PO) Take 1 capsule by mouth.    Historical Provider, MD  omeprazole (PRILOSEC) 20 MG capsule TAKE ONE CAPSULE EVERY DAY 11/12/12   Jeanann Lewandowsky, MD  polyethylene glycol powder (GLYCOLAX/MIRALAX) powder Take 17 g by mouth daily. 10/23/12   Phill Mutter Dammen, PA-C  terbinafine (LAMISIL AT) 1 % cream Apply topically 2 (two) times daily. 10/23/12   Phill Mutter Dammen, PA-C  traZODone (DESYREL) 50 MG tablet Take 100 mg by mouth at bedtime.    Historical Provider, MD  zolpidem (AMBIEN) 10 MG tablet Take 10 mg by mouth at bedtime as needed for sleep.    Historical Provider, MD     Assessment & Plan    Cervical spine neck pain with tingling and numbness in the right arm secondary to C6 C7 disc protrusion, neurosurgical consultation has been made on urgent basis. She will be provided with NSAIDs for pain relief and reducing any surrounding inflammation.   Cigarette smoking, morbid obesity, recent pharyngitis - counseled to quit smoking, counseled on diet exercise, pharyngitis has resolved.     Routine health maintenance.  Screening labs. Recent CBC BMP noted and stable, have  ordered TSH, A1c and lipid panel   Mammogram referral made, per patient had NML  Pap smear by her OB 1 month ago  Immunizations Flu shot given, Last TDap 4 yrs ago.       Leroy Sea M.D on 12/01/2012 at 2:35 PM

## 2012-12-02 LAB — TSH: TSH: 1.554 u[IU]/mL (ref 0.350–4.500)

## 2012-12-03 ENCOUNTER — Telehealth: Payer: Self-pay | Admitting: Internal Medicine

## 2012-12-03 NOTE — Telephone Encounter (Signed)
S/W PT AND GVE NP APPT 10/08 @ 10:30 W/DR. CHISM REFERRING DR. Thedore Mins  DX- NON SPECIFIC LEUKOCYTOSIS  WELCOME PACKET MAILED

## 2012-12-03 NOTE — Progress Notes (Signed)
Quick Note:  Ask patient to be on low fat and Low Carb diet, she has been referred to Hematology for persistant mild Leukocytosis ______

## 2012-12-04 ENCOUNTER — Telehealth: Payer: Self-pay | Admitting: Internal Medicine

## 2012-12-04 ENCOUNTER — Telehealth: Payer: Self-pay

## 2012-12-04 NOTE — Telephone Encounter (Signed)
Message copied by Lestine Mount on Thu Dec 04, 2012  1:16 PM ------      Message from: [facility]      Created: Wed Dec 03, 2012  5:50 AM       Ask patient to be on low fat and Low Carb diet, she has been referred to Hematology for persistant mild Leukocytosis ------

## 2012-12-04 NOTE — Telephone Encounter (Signed)
Patient is aware of lab results Has appt on 10/8/ at hemotologist

## 2012-12-04 NOTE — Telephone Encounter (Signed)
C/D 12/04/12 for appt. 12/31/12

## 2012-12-05 ENCOUNTER — Ambulatory Visit: Payer: Medicaid Other | Attending: Internal Medicine | Admitting: Internal Medicine

## 2012-12-05 ENCOUNTER — Other Ambulatory Visit: Payer: Self-pay | Admitting: Neurosurgery

## 2012-12-05 ENCOUNTER — Encounter: Payer: Self-pay | Admitting: Internal Medicine

## 2012-12-05 VITALS — BP 135/89 | HR 78 | Temp 98.6°F | Resp 16 | Ht 64.0 in | Wt 262.0 lb

## 2012-12-05 DIAGNOSIS — R209 Unspecified disturbances of skin sensation: Secondary | ICD-10-CM | POA: Insufficient documentation

## 2012-12-05 DIAGNOSIS — M542 Cervicalgia: Secondary | ICD-10-CM

## 2012-12-05 DIAGNOSIS — G894 Chronic pain syndrome: Secondary | ICD-10-CM | POA: Insufficient documentation

## 2012-12-05 MED ORDER — ALPRAZOLAM 0.25 MG PO TABS: 0.2500 mg | ORAL_TABLET | Freq: Two times a day (BID) | ORAL | Status: DC | PRN

## 2012-12-05 MED ORDER — CYCLOBENZAPRINE HCL 10 MG PO TABS: 10.0000 mg | ORAL_TABLET | Freq: Three times a day (TID) | ORAL | Status: DC | PRN

## 2012-12-05 MED ORDER — DICLOFENAC POTASSIUM 50 MG PO TABS: 50.0000 mg | ORAL_TABLET | Freq: Three times a day (TID) | ORAL | Status: DC | PRN

## 2012-12-05 NOTE — Progress Notes (Signed)
Pt is here today for a F/U visit. Pt reports that she is only here today for medications For her high Cholesterol, fluid pills, and anxiety medication for her neck surgery on 12/09/2012

## 2012-12-05 NOTE — Patient Instructions (Signed)
Hypertriglyceridemia  Diet for High blood levels of Triglycerides Most fats in food are triglycerides. Triglycerides in your blood are stored as fat in your body. High levels of triglycerides in your blood may put you at a greater risk for heart disease and stroke.  Normal triglyceride levels are less than 150 mg/dL. Borderline high levels are 150-199 mg/dl. High levels are 200 - 499 mg/dL, and very high triglyceride levels are greater than 500 mg/dL. The decision to treat high triglycerides is generally based on the level. For people with borderline or high triglyceride levels, treatment includes weight loss and exercise. Drugs are recommended for people with very high triglyceride levels. Many people who need treatment for high triglyceride levels have metabolic syndrome. This syndrome is a collection of disorders that often include: insulin resistance, high blood pressure, blood clotting problems, high cholesterol and triglycerides. TESTING PROCEDURE FOR TRIGLYCERIDES  You should not eat 4 hours before getting your triglycerides measured. The normal range of triglycerides is between 10 and 250 milligrams per deciliter (mg/dl). Some people may have extreme levels (1000 or above), but your triglyceride level may be too high if it is above 150 mg/dl, depending on what other risk factors you have for heart disease.  People with high blood triglycerides may also have high blood cholesterol levels. If you have high blood cholesterol as well as high blood triglycerides, your risk for heart disease is probably greater than if you only had high triglycerides. High blood cholesterol is one of the main risk factors for heart disease. CHANGING YOUR DIET  Your weight can affect your blood triglyceride level. If you are more than 20% above your ideal body weight, you may be able to lower your blood triglycerides by losing weight. Eating less and exercising regularly is the best way to combat this. Fat provides more  calories than any other food. The best way to lose weight is to eat less fat. Only 30% of your total calories should come from fat. Less than 7% of your diet should come from saturated fat. A diet low in fat and saturated fat is the same as a diet to decrease blood cholesterol. By eating a diet lower in fat, you may lose weight, lower your blood cholesterol, and lower your blood triglyceride level.  Eating a diet low in fat, especially saturated fat, may also help you lower your blood triglyceride level. Ask your dietitian to help you figure how much fat you can eat based on the number of calories your caregiver has prescribed for you.  Exercise, in addition to helping with weight loss may also help lower triglyceride levels.   Alcohol can increase blood triglycerides. You may need to stop drinking alcoholic beverages.  Too much carbohydrate in your diet may also increase your blood triglycerides. Some complex carbohydrates are necessary in your diet. These may include bread, rice, potatoes, other starchy vegetables and cereals.  Reduce "simple" carbohydrates. These may include pure sugars, candy, honey, and jelly without losing other nutrients. If you have the kind of high blood triglycerides that is affected by the amount of carbohydrates in your diet, you will need to eat less sugar and less high-sugar foods. Your caregiver can help you with this.  Adding 2-4 grams of fish oil (EPA+ DHA) may also help lower triglycerides. Speak with your caregiver before adding any supplements to your regimen. Following the Diet  Maintain your ideal weight. Your caregivers can help you with a diet. Generally, eating less food and getting more   exercise will help you lose weight. Joining a weight control group may also help. Ask your caregivers for a good weight control group in your area.  Eat low-fat foods instead of high-fat foods. This can help you lose weight too.  These foods are lower in fat. Eat MORE of these:    Dried beans, peas, and lentils.  Egg whites.  Low-fat cottage cheese.  Fish.  Lean cuts of meat, such as round, sirloin, rump, and flank (cut extra fat off meat you fix).  Whole grain breads, cereals and pasta.  Skim and nonfat dry milk.  Low-fat yogurt.  Poultry without the skin.  Cheese made with skim or part-skim milk, such as mozzarella, parmesan, farmers', ricotta, or pot cheese. These are higher fat foods. Eat LESS of these:   Whole milk and foods made from whole milk, such as American, blue, cheddar, monterey jack, and swiss cheese  High-fat meats, such as luncheon meats, sausages, knockwurst, bratwurst, hot dogs, ribs, corned beef, ground pork, and regular ground beef.  Fried foods. Limit saturated fats in your diet. Substituting unsaturated fat for saturated fat may decrease your blood triglyceride level. You will need to read package labels to know which products contain saturated fats.  These foods are high in saturated fat. Eat LESS of these:   Fried pork skins.  Whole milk.  Skin and fat from poultry.  Palm oil.  Butter.  Shortening.  Cream cheese.  Bacon.  Margarines and baked goods made from listed oils.  Vegetable shortenings.  Chitterlings.  Fat from meats.  Coconut oil.  Palm kernel oil.  Lard.  Cream.  Sour cream.  Fatback.  Coffee whiteners and non-dairy creamers made with these oils.  Cheese made from whole milk. Use unsaturated fats (both polyunsaturated and monounsaturated) moderately. Remember, even though unsaturated fats are better than saturated fats; you still want a diet low in total fat.  These foods are high in unsaturated fat:   Canola oil.  Sunflower oil.  Mayonnaise.  Almonds.  Peanuts.  Pine nuts.  Margarines made with these oils.  Safflower oil.  Olive oil.  Avocados.  Cashews.  Peanut butter.  Sunflower seeds.  Soybean oil.  Peanut  oil.  Olives.  Pecans.  Walnuts.  Pumpkin seeds. Avoid sugar and other high-sugar foods. This will decrease carbohydrates without decreasing other nutrients. Sugar in your food goes rapidly to your blood. When there is excess sugar in your blood, your liver may use it to make more triglycerides. Sugar also contains calories without other important nutrients.  Eat LESS of these:   Sugar, brown sugar, powdered sugar, jam, jelly, preserves, honey, syrup, molasses, pies, candy, cakes, cookies, frosting, pastries, colas, soft drinks, punches, fruit drinks, and regular gelatin.  Avoid alcohol. Alcohol, even more than sugar, may increase blood triglycerides. In addition, alcohol is high in calories and low in nutrients. Ask for sparkling water, or a diet soft drink instead of an alcoholic beverage. Suggestions for planning and preparing meals   Bake, broil, grill or roast meats instead of frying.  Remove fat from meats and skin from poultry before cooking.  Add spices, herbs, lemon juice or vinegar to vegetables instead of salt, rich sauces or gravies.  Use a non-stick skillet without fat or use no-stick sprays.  Cool and refrigerate stews and broth. Then remove the hardened fat floating on the surface before serving.  Refrigerate meat drippings and skim off fat to make low-fat gravies.  Serve more fish.  Use less butter,   margarine and other high-fat spreads on bread or vegetables.  Use skim or reconstituted non-fat dry milk for cooking.  Cook with low-fat cheeses.  Substitute low-fat yogurt or cottage cheese for all or part of the sour cream in recipes for sauces, dips or congealed salads.  Use half yogurt/half mayonnaise in salad recipes.  Substitute evaporated skim milk for cream. Evaporated skim milk or reconstituted non-fat dry milk can be whipped and substituted for whipped cream in certain recipes.  Choose fresh fruits for dessert instead of high-fat foods such as pies or  cakes. Fruits are naturally low in fat. When Dining Out   Order low-fat appetizers such as fruit or vegetable juice, pasta with vegetables or tomato sauce.  Select clear, rather than cream soups.  Ask that dressings and gravies be served on the side. Then use less of them.  Order foods that are baked, broiled, poached, steamed, stir-fried, or roasted.  Ask for margarine instead of butter, and use only a small amount.  Drink sparkling water, unsweetened tea or coffee, or diet soft drinks instead of alcohol or other sweet beverages. QUESTIONS AND ANSWERS ABOUT OTHER FATS IN THE BLOOD: SATURATED FAT, TRANS FAT, AND CHOLESTEROL What is trans fat? Trans fat is a type of fat that is formed when vegetable oil is hardened through a process called hydrogenation. This process helps makes foods more solid, gives them shape, and prolongs their shelf life. Trans fats are also called hydrogenated or partially hydrogenated oils.  What do saturated fat, trans fat, and cholesterol in foods have to do with heart disease? Saturated fat, trans fat, and cholesterol in the diet all raise the level of LDL "bad" cholesterol in the blood. The higher the LDL cholesterol, the greater the risk for coronary heart disease (CHD). Saturated fat and trans fat raise LDL similarly.  What foods contain saturated fat, trans fat, and cholesterol? High amounts of saturated fat are found in animal products, such as fatty cuts of meat, chicken skin, and full-fat dairy products like butter, whole milk, cream, and cheese, and in tropical vegetable oils such as palm, palm kernel, and coconut oil. Trans fat is found in some of the same foods as saturated fat, such as vegetable shortening, some margarines (especially hard or stick margarine), crackers, cookies, baked goods, fried foods, salad dressings, and other processed foods made with partially hydrogenated vegetable oils. Small amounts of trans fat also occur naturally in some animal  products, such as milk products, beef, and lamb. Foods high in cholesterol include liver, other organ meats, egg yolks, shrimp, and full-fat dairy products. How can I use the new food label to make heart-healthy food choices? Check the Nutrition Facts panel of the food label. Choose foods lower in saturated fat, trans fat, and cholesterol. For saturated fat and cholesterol, you can also use the Percent Daily Value (%DV): 5% DV or less is low, and 20% DV or more is high. (There is no %DV for trans fat.) Use the Nutrition Facts panel to choose foods low in saturated fat and cholesterol, and if the trans fat is not listed, read the ingredients and limit products that list shortening or hydrogenated or partially hydrogenated vegetable oil, which tend to be high in trans fat. POINTS TO REMEMBER:   Discuss your risk for heart disease with your caregivers, and take steps to reduce risk factors.  Change your diet. Choose foods that are low in saturated fat, trans fat, and cholesterol.  Add exercise to your daily routine if   it is not already being done. Participate in physical activity of moderate intensity, like brisk walking, for at least 30 minutes on most, and preferably all days of the week. No time? Break the 30 minutes into three, 10-minute segments during the day.  Stop smoking. If you do smoke, contact your caregiver to discuss ways in which they can help you quit.  Do not use street drugs.  Maintain a normal weight.  Maintain a healthy blood pressure.  Keep up with your blood work for checking the fats in your blood as directed by your caregiver. Document Released: 12/29/2003 Document Revised: 09/11/2011 Document Reviewed: 07/26/2008 [employer] Patient Information 2014 [employer].  

## 2012-12-05 NOTE — Progress Notes (Signed)
Patient ID: [name], female   DOB: 1985/10/13, 27 y.o.   MRN: [id number] Patient Demographics  Kyndahl Jablon, is a 27 y.o. female  WUJ:[id number]  [id number]  DOB - 04-27-1985  Chief Complaint  Patient presents with  . Follow-up        Subjective:   [name] is a 27 y.o. female here today for a follow up visit. No new complaints today except that she is extremely anxious about surgery coming up on Tuesday of next week scheduled by neurosurgeon. She called the neurosurgery office and she was sent here for possibility of Xanax prescription to allay her anxiety. Patient has No headache, No chest pain, No abdominal pain - No Nausea, No new weakness tingling or numbness, No Cough - SOB.  ALLERGIES:   Allergies  Allergen Reactions  . Latex Itching    PAST MEDICAL HISTORY: Past Medical History  Diagnosis Date  . Scoliosis   . Obesity   . Migraine   . Anxiety   . Gallstones   . Asthma   . Arthritis   . Pelvic inflammatory disease (PID) 05-08-11    previous hx. .Hx. childbirth x3-NVD    MEDICATIONS AT HOME: Prior to Admission medications   Medication Sig Start Date End Date Taking? Authorizing Provider  albuterol (PROVENTIL HFA;VENTOLIN HFA) 108 (90 BASE) MCG/ACT inhaler Inhale 2 puffs into the lungs every 6 (six) hours as needed for wheezing.     Historical Provider, MD  Aspirin-Acetaminophen-Caffeine (GOODY HEADACHE PO) Take 1 packet by mouth.    Historical Provider, MD  CALCIUM-VITAMIN D PO Take 1 capsule by mouth daily.    Historical Provider, MD  cyclobenzaprine (FLEXERIL) 5 MG tablet Take 1 tablet (5 mg total) by mouth 3 (three) times daily as needed for muscle spasms. 10/28/12   Clanford Cyndie Mull, MD  diazepam (VALIUM) 5 MG tablet Take one by mouth thirty minutes prior to MRI 11/14/12   Nestor Ramp, MD  diclofenac (CATAFLAM) 50 MG tablet Take 1 tablet (50 mg total) by mouth 3 (three) times daily as needed (pain and inflammation). 10/28/12   Clanford Cyndie Mull,  MD  FLUoxetine (PROZAC) 20 MG capsule Take 60 mg by mouth daily.    Historical Provider, MD  fluticasone (FLONASE) 50 MCG/ACT nasal spray Place 2 sprays into the nose daily. 11/07/12   Richarda Overlie, MD  HYDROcodone-acetaminophen (NORCO/VICODIN) 5-325 MG per tablet Take 1 tablet by mouth every 6 (six) hours as needed for pain. 09/30/12   Antony Madura, PA-C  ibuprofen (ADVIL,MOTRIN) 800 MG tablet Take 1 tablet (800 mg total) by mouth every 12 (twelve) hours as needed for pain. 12/01/12   Leroy Sea, MD  medroxyPROGESTERone (DEPO-PROVERA) 150 MG/ML injection Inject 150 mg into the muscle every 3 (three) months.    Historical Provider, MD  methylPREDNIsolone (MEDROL DOSPACK) 4 MG tablet follow package directions 11/07/12   Richarda Overlie, MD  Multiple Vitamins-Minerals (WOMENS ONE DAILY PO) Take 1 capsule by mouth.    Historical Provider, MD  omeprazole (PRILOSEC) 20 MG capsule TAKE ONE CAPSULE EVERY DAY 11/12/12   Jeanann Lewandowsky, MD  polyethylene glycol powder (GLYCOLAX/MIRALAX) powder Take 17 g by mouth daily. 10/23/12   Phill Mutter Dammen, PA-C  terbinafine (LAMISIL AT) 1 % cream Apply topically 2 (two) times daily. 10/23/12   Phill Mutter Dammen, PA-C  traZODone (DESYREL) 50 MG tablet Take 100 mg by mouth at bedtime.    Historical Provider, MD  zolpidem (AMBIEN) 10 MG tablet Take 10 mg by mouth  at bedtime as needed for sleep.    Historical Provider, MD     Objective:   Filed Vitals:   12/05/12 1709  BP: 135/89  Pulse: 78  Temp: 98.6 F (37 C)  TempSrc: Oral  Resp: 16  Height: 5\' 4"  (1.626 m)  Weight: 262 lb (118.842 kg)  SpO2: 98%    Exam General appearance :Awake, alert, not in any distress. Speech Clear. Not toxic Looking, morbidly obese HEENT: Atraumatic and Normocephalic, pupils equally reactive to light and accomodation Neck: Limited motion due to pain, no JVD. No cervical lymphadenopathy.  Chest:Good air entry bilaterally, no added sounds CVS: S1 S2 regular, no murmurs.  Abdomen: Bowel  sounds present, Non tender and not distended with no gaurding, rigidity or rebound. Extremities: B/L Lower Ext shows no edema, both legs are warm to touch Neurology: Awake alert, and oriented X 3, CN II-XII intact, Non focal Skin:No Rash Wounds:N/A   Data Review   CBC  Recent Labs Lab 12/01/12 1439  WBC 12.2*  HGB 11.5*  HCT 33.9*  PLT 286  MCV 82.9  MCH 28.1  MCHC 33.9  RDW 14.5    Chemistries   No results found for this basename: NA, K, CL, CO2, GLUCOSE, BUN, CREATININE, GFRCGP, CALCIUM, MG, AST, ALT, ALKPHOS, BILITOT,  in the last 168 hours ------------------------------------------------------------------------------------------------------------------ No results found for this basename: HGBA1C,  in the last 72 hours ------------------------------------------------------------------------------------------------------------------ No results found for this basename: CHOL, HDL, LDLCALC, TRIG, CHOLHDL, LDLDIRECT,  in the last 72 hours ------------------------------------------------------------------------------------------------------------------ No results found for this basename: TSH, T4TOTAL, FREET3, T3FREE, THYROIDAB,  in the last 72 hours ------------------------------------------------------------------------------------------------------------------ No results found for this basename: VITAMINB12, FOLATE, FERRITIN, TIBC, IRON, RETICCTPCT,  in the last 72 hours  Coagulation profile  No results found for this basename: INR, PROTIME,  in the last 168 hours    Assessment & Plan   Patient Active Problem List   Diagnosis Date Noted  . Numbness of arm 11/14/2012  . Chronic pain syndrome 10/28/2012  . Neck pain 10/28/2012  . Back pain 10/28/2012  . Osteoarthritis 10/28/2012  . Obesity   . Migraine   . Asthma      Plan: Xanax 0.25 mg tablet by mouth twice a day when necessary anxiety Flexeril 10 mg tablets by mouth 3 times a day Diclofenac 50 mg tablet by  mouth twice a day Continue omeprazole and other medications as prescribed Patient counseled extensively about the use of opiates, narcotics and SSRIs. Patient was educated on likely side effects of polypharmacy especially with sedatives and anxiolytics including the possibility of death from overuse, the patient verbalized understanding  Patient counseled extensively about nutrition and exercise Patient was encouraged to keep her appointment with the neurosurgeon for surgery on Tuesday of next week  Follow up in 2 months after recovery from surgery and physical therapy   The patient was given clear instructions to go to ER or return to medical center if symptoms don't improve, worsen or new problems develop. The patient verbalized understanding. The patient was told to call to get lab results if they haven't heard anything in the next week.    Jeanann Lewandowsky, MD, MHA, FACP [facility] [place], [place] [phone]   12/05/2012, 5:11 PM

## 2012-12-08 ENCOUNTER — Encounter (HOSPITAL_COMMUNITY): Payer: Self-pay | Admitting: *Deleted

## 2012-12-08 ENCOUNTER — Ambulatory Visit: Payer: Medicaid Other | Admitting: Family Medicine

## 2012-12-08 MED ORDER — CEFAZOLIN SODIUM-DEXTROSE 2-3 GM-% IV SOLR
2.0000 g | INTRAVENOUS | Status: AC
  Administered 2012-12-09: 2 g via INTRAVENOUS
  Filled 2012-12-08: qty 50

## 2012-12-09 ENCOUNTER — Encounter (HOSPITAL_COMMUNITY)
Admission: RE | Disposition: A | Discharge status: Home / Self Care | Payer: Self-pay | Source: Ambulatory Visit | Attending: Neurosurgery

## 2012-12-09 ENCOUNTER — Encounter (HOSPITAL_COMMUNITY): Payer: Self-pay | Admitting: Anesthesiology

## 2012-12-09 ENCOUNTER — Inpatient Hospital Stay (HOSPITAL_COMMUNITY): Payer: Medicaid Other | Admitting: Anesthesiology

## 2012-12-09 ENCOUNTER — Encounter (HOSPITAL_COMMUNITY): Payer: Self-pay | Admitting: *Deleted

## 2012-12-09 ENCOUNTER — Inpatient Hospital Stay (HOSPITAL_COMMUNITY)
Admission: RE | Admit: 2012-12-09 | Discharge: 2012-12-10 | DRG: 473 | Disposition: A | Discharge status: Home / Self Care | Payer: Medicaid Other | Source: Ambulatory Visit | Attending: Neurosurgery | Admitting: Neurosurgery

## 2012-12-09 ENCOUNTER — Inpatient Hospital Stay (HOSPITAL_COMMUNITY): Payer: Medicaid Other

## 2012-12-09 DIAGNOSIS — M5020 Other cervical disc displacement, unspecified cervical region: Principal | ICD-10-CM | POA: Diagnosis present

## 2012-12-09 DIAGNOSIS — Z79899 Other long term (current) drug therapy: Secondary | ICD-10-CM

## 2012-12-09 DIAGNOSIS — Z23 Encounter for immunization: Secondary | ICD-10-CM

## 2012-12-09 HISTORY — PX: ANTERIOR CERVICAL DECOMP/DISCECTOMY FUSION: SHX1161

## 2012-12-09 HISTORY — DX: Elevated white blood cell count, unspecified: D72.829

## 2012-12-09 HISTORY — DX: Personal history of diseases of the blood and blood-forming organs and certain disorders involving the immune mechanism: Z86.2

## 2012-12-09 HISTORY — DX: Personal history of other diseases of urinary system: Z87.448

## 2012-12-09 HISTORY — DX: Edema, unspecified: R60.9

## 2012-12-09 LAB — CBC WITH DIFFERENTIAL/PLATELET
Eosinophils Absolute: 0.4 10*3/uL (ref 0.0–0.7)
Hemoglobin: 11.4 g/dL — ABNORMAL LOW (ref 12.0–15.0)
Lymphocytes Relative: 30 % (ref 12–46)
Lymphs Abs: 3.2 10*3/uL (ref 0.7–4.0)
Monocytes Relative: 6 % (ref 3–12)
Neutro Abs: 6.3 10*3/uL (ref 1.7–7.7)
Neutrophils Relative %: 60 % (ref 43–77)
Platelets: 287 10*3/uL (ref 150–400)
RBC: 4.06 MIL/uL (ref 3.87–5.11)
WBC: 10.6 10*3/uL — ABNORMAL HIGH (ref 4.0–10.5)

## 2012-12-09 LAB — BASIC METABOLIC PANEL
Calcium: 9 mg/dL (ref 8.4–10.5)
GFR calc Af Amer: >90 mL/min (ref >90)
GFR calc non Af Amer: >90 mL/min (ref >90)
Glucose, Bld: 107 mg/dL — ABNORMAL HIGH (ref 70–99)
Potassium: 4.5 mEq/L (ref 3.5–5.1)
Sodium: 136 mEq/L (ref 135–145)

## 2012-12-09 LAB — SURGICAL PCR SCREEN: Staphylococcus aureus: NEGATIVE

## 2012-12-09 LAB — PROTIME-INR
INR: 1.01 (ref 0.00–1.49)
Prothrombin Time: 13.1 seconds (ref 11.6–15.2)

## 2012-12-09 LAB — HCG, SERUM, QUALITATIVE: Preg, Serum: NEGATIVE

## 2012-12-09 LAB — APTT: aPTT: 28 seconds (ref 24–37)

## 2012-12-09 SURGERY — ANTERIOR CERVICAL DECOMPRESSION/DISCECTOMY FUSION 2 LEVELS
Anesthesia: General | Site: Neck | Wound class: Clean

## 2012-12-09 MED ORDER — GLYCOPYRROLATE 0.2 MG/ML IJ SOLN
INTRAMUSCULAR | Status: DC | PRN
  Administered 2012-12-09: 0.6 mg via INTRAVENOUS

## 2012-12-09 MED ORDER — DOCUSATE SODIUM 100 MG PO CAPS
100.0000 mg | ORAL_CAPSULE | Freq: Two times a day (BID) | ORAL | Status: DC
  Administered 2012-12-09: 100 mg via ORAL
  Filled 2012-12-09: qty 1

## 2012-12-09 MED ORDER — ARIPIPRAZOLE 5 MG PO TABS
5.0000 mg | ORAL_TABLET | Freq: Every day | ORAL | Status: DC
  Administered 2012-12-10: 5 mg via ORAL
  Filled 2012-12-09: qty 1

## 2012-12-09 MED ORDER — SODIUM CHLORIDE 0.9 % IJ SOLN
3.0000 mL | Freq: Two times a day (BID) | INTRAMUSCULAR | Status: DC
  Administered 2012-12-09: 3 mL via INTRAVENOUS

## 2012-12-09 MED ORDER — ROCURONIUM BROMIDE 100 MG/10ML IV SOLN
INTRAVENOUS | Status: DC | PRN
  Administered 2012-12-09 (×2): 50 mg via INTRAVENOUS

## 2012-12-09 MED ORDER — MAGNESIUM HYDROXIDE 400 MG/5ML PO SUSP: 30.0000 mL | Freq: Every day | ORAL | Status: DC | PRN

## 2012-12-09 MED ORDER — NEOSTIGMINE METHYLSULFATE 1 MG/ML IJ SOLN
INTRAMUSCULAR | Status: DC | PRN
  Administered 2012-12-09: 4 mg via INTRAVENOUS

## 2012-12-09 MED ORDER — MUPIROCIN 2 % EX OINT
TOPICAL_OINTMENT | Freq: Two times a day (BID) | CUTANEOUS | Status: DC
  Administered 2012-12-09: 10:00:00 via NASAL
  Filled 2012-12-09: qty 22

## 2012-12-09 MED ORDER — SODIUM CHLORIDE 0.9 % IR SOLN
Status: DC | PRN
  Administered 2012-12-09: 12:00:00

## 2012-12-09 MED ORDER — MUPIROCIN 2 % EX OINT
TOPICAL_OINTMENT | CUTANEOUS | Status: AC
  Filled 2012-12-09: qty 22

## 2012-12-09 MED ORDER — PANTOPRAZOLE SODIUM 40 MG PO TBEC: 40.0000 mg | DELAYED_RELEASE_TABLET | Freq: Every day | ORAL | Status: DC

## 2012-12-09 MED ORDER — PHENOL 1.4 % MT LIQD
1.0000 | OROMUCOSAL | Status: DC | PRN
  Administered 2012-12-09: 1 via OROMUCOSAL
  Filled 2012-12-09: qty 177

## 2012-12-09 MED ORDER — METHOCARBAMOL 500 MG PO TABS: 500.0000 mg | ORAL_TABLET | Freq: Four times a day (QID) | ORAL | Status: DC | PRN

## 2012-12-09 MED ORDER — LACTATED RINGERS IV SOLN
INTRAVENOUS | Status: DC | PRN
  Administered 2012-12-09 (×2): via INTRAVENOUS

## 2012-12-09 MED ORDER — VECURONIUM BROMIDE 10 MG IV SOLR
INTRAVENOUS | Status: DC | PRN
  Administered 2012-12-09: 3 mg via INTRAVENOUS
  Administered 2012-12-09: 1 mg via INTRAVENOUS

## 2012-12-09 MED ORDER — HEMOSTATIC AGENTS (NO CHARGE) OPTIME
TOPICAL | Status: DC | PRN
  Administered 2012-12-09 (×2): 1 via TOPICAL

## 2012-12-09 MED ORDER — LACTATED RINGERS IV SOLN: INTRAVENOUS | Status: DC

## 2012-12-09 MED ORDER — ARTIFICIAL TEARS OP OINT
TOPICAL_OINTMENT | OPHTHALMIC | Status: DC | PRN
  Administered 2012-12-09: 1 via OPHTHALMIC

## 2012-12-09 MED ORDER — METHOCARBAMOL 100 MG/ML IJ SOLN
500.0000 mg | Freq: Four times a day (QID) | INTRAVENOUS | Status: DC | PRN
  Filled 2012-12-09: qty 5

## 2012-12-09 MED ORDER — ACETAMINOPHEN 325 MG PO TABS: 650.0000 mg | ORAL_TABLET | ORAL | Status: DC | PRN

## 2012-12-09 MED ORDER — DEXMEDETOMIDINE HCL IN NACL 200 MCG/50ML IV SOLN
INTRAVENOUS | Status: DC | PRN
  Administered 2012-12-09: 0.2 ug/kg/h via INTRAVENOUS

## 2012-12-09 MED ORDER — ALBUTEROL SULFATE HFA 108 (90 BASE) MCG/ACT IN AERS
2.0000 | INHALATION_SPRAY | Freq: Four times a day (QID) | RESPIRATORY_TRACT | Status: DC | PRN
  Administered 2012-12-09: 2 via RESPIRATORY_TRACT
  Filled 2012-12-09 (×2): qty 6.7

## 2012-12-09 MED ORDER — ONDANSETRON HCL 4 MG/2ML IJ SOLN: 4.0000 mg | Freq: Once | INTRAMUSCULAR | Status: DC | PRN

## 2012-12-09 MED ORDER — HYDROCODONE-ACETAMINOPHEN 5-325 MG PO TABS: 1.0000 | ORAL_TABLET | ORAL | Status: DC | PRN

## 2012-12-09 MED ORDER — FENTANYL CITRATE 0.05 MG/ML IJ SOLN
INTRAMUSCULAR | Status: DC | PRN
  Administered 2012-12-09 (×2): 100 ug via INTRAVENOUS
  Administered 2012-12-09: 150 ug via INTRAVENOUS
  Administered 2012-12-09: 100 ug via INTRAVENOUS

## 2012-12-09 MED ORDER — PHENYLEPHRINE HCL 10 MG/ML IJ SOLN
INTRAMUSCULAR | Status: DC | PRN
  Administered 2012-12-09: 40 ug via INTRAVENOUS
  Administered 2012-12-09 (×4): 80 ug via INTRAVENOUS
  Administered 2012-12-09: 40 ug via INTRAVENOUS
  Administered 2012-12-09: 80 ug via INTRAVENOUS
  Administered 2012-12-09 (×2): 40 ug via INTRAVENOUS

## 2012-12-09 MED ORDER — LIDOCAINE-EPINEPHRINE 1 %-1:100000 IJ SOLN
INTRAMUSCULAR | Status: DC | PRN
  Administered 2012-12-09: 10 mL

## 2012-12-09 MED ORDER — PNEUMOCOCCAL VAC POLYVALENT 25 MCG/0.5ML IJ INJ
0.5000 mL | INJECTION | INTRAMUSCULAR | Status: AC
  Administered 2012-12-10: 0.5 mL via INTRAMUSCULAR
  Filled 2012-12-09: qty 0.5

## 2012-12-09 MED ORDER — MIDAZOLAM HCL 5 MG/5ML IJ SOLN
INTRAMUSCULAR | Status: DC | PRN
  Administered 2012-12-09: 4 mg via INTRAVENOUS

## 2012-12-09 MED ORDER — FLUOXETINE HCL 20 MG PO CAPS
60.0000 mg | ORAL_CAPSULE | Freq: Every morning | ORAL | Status: DC
  Administered 2012-12-10: 60 mg via ORAL
  Filled 2012-12-09: qty 3

## 2012-12-09 MED ORDER — TRAZODONE HCL 50 MG PO TABS
50.0000 mg | ORAL_TABLET | Freq: Every day | ORAL | Status: DC
  Administered 2012-12-09: 50 mg via ORAL
  Filled 2012-12-09 (×2): qty 1

## 2012-12-09 MED ORDER — DEXMEDETOMIDINE HCL IN NACL 200 MCG/50ML IV SOLN
0.4000 ug/kg/h | INTRAVENOUS | Status: DC
  Filled 2012-12-09: qty 50

## 2012-12-09 MED ORDER — BISACODYL 10 MG RE SUPP: 10.0000 mg | Freq: Every day | RECTAL | Status: DC | PRN

## 2012-12-09 MED ORDER — LACTATED RINGERS IV SOLN
INTRAVENOUS | Status: DC
  Administered 2012-12-09: 10:00:00 via INTRAVENOUS

## 2012-12-09 MED ORDER — CYCLOBENZAPRINE HCL 10 MG PO TABS
10.0000 mg | ORAL_TABLET | Freq: Three times a day (TID) | ORAL | Status: DC | PRN
  Administered 2012-12-10: 10 mg via ORAL
  Filled 2012-12-09: qty 1

## 2012-12-09 MED ORDER — KETOROLAC TROMETHAMINE 30 MG/ML IJ SOLN
30.0000 mg | Freq: Four times a day (QID) | INTRAMUSCULAR | Status: DC
  Administered 2012-12-09 – 2012-12-10 (×3): 30 mg via INTRAVENOUS
  Filled 2012-12-09 (×3): qty 1

## 2012-12-09 MED ORDER — THROMBIN 5000 UNITS EX SOLR
CUTANEOUS | Status: DC | PRN
  Administered 2012-12-09 (×4): 5000 [IU] via TOPICAL

## 2012-12-09 MED ORDER — THROMBIN 5000 UNITS EX SOLR
OROMUCOSAL | Status: DC | PRN
  Administered 2012-12-09: 14:00:00 via TOPICAL

## 2012-12-09 MED ORDER — MORPHINE SULFATE 2 MG/ML IJ SOLN
1.0000 mg | INTRAMUSCULAR | Status: DC | PRN
  Administered 2012-12-09: 2 mg via INTRAVENOUS
  Administered 2012-12-10: 4 mg via INTRAVENOUS
  Filled 2012-12-09: qty 1
  Filled 2012-12-09: qty 2

## 2012-12-09 MED ORDER — DEXAMETHASONE SODIUM PHOSPHATE 4 MG/ML IJ SOLN
INTRAMUSCULAR | Status: DC | PRN
  Administered 2012-12-09: 8 mg via INTRAVENOUS

## 2012-12-09 MED ORDER — CEFAZOLIN SODIUM 1-5 GM-% IV SOLN
1.0000 g | Freq: Three times a day (TID) | INTRAVENOUS | Status: AC
  Administered 2012-12-09 – 2012-12-10 (×2): 1 g via INTRAVENOUS
  Filled 2012-12-09 (×2): qty 50

## 2012-12-09 MED ORDER — FLUTICASONE PROPIONATE 50 MCG/ACT NA SUSP: 2.0000 | Freq: Every day | NASAL | Status: DC

## 2012-12-09 MED ORDER — ONDANSETRON HCL 4 MG/2ML IJ SOLN
INTRAMUSCULAR | Status: DC | PRN
  Administered 2012-12-09: 4 mg via INTRAVENOUS

## 2012-12-09 MED ORDER — ZOLPIDEM TARTRATE 5 MG PO TABS
5.0000 mg | ORAL_TABLET | Freq: Every evening | ORAL | Status: DC | PRN
  Administered 2012-12-09: 5 mg via ORAL
  Filled 2012-12-09: qty 1

## 2012-12-09 MED ORDER — LIDOCAINE HCL (CARDIAC) 20 MG/ML IV SOLN
INTRAVENOUS | Status: DC | PRN
  Administered 2012-12-09: 40 mg via INTRAVENOUS

## 2012-12-09 MED ORDER — NICOTINE 14 MG/24HR TD PT24
14.0000 mg | MEDICATED_PATCH | Freq: Every day | TRANSDERMAL | Status: DC
  Administered 2012-12-09: 14 mg via TRANSDERMAL
  Filled 2012-12-09 (×2): qty 1

## 2012-12-09 MED ORDER — ALBUMIN HUMAN 5 % IV SOLN
INTRAVENOUS | Status: DC | PRN
  Administered 2012-12-09 (×2): via INTRAVENOUS

## 2012-12-09 MED ORDER — PROMETHAZINE HCL 25 MG PO TABS: 12.5000 mg | ORAL_TABLET | ORAL | Status: DC | PRN

## 2012-12-09 MED ORDER — SODIUM CHLORIDE 0.9 % IJ SOLN: 3.0000 mL | INTRAMUSCULAR | Status: DC | PRN

## 2012-12-09 MED ORDER — OXYCODONE-ACETAMINOPHEN 5-325 MG PO TABS
1.0000 | ORAL_TABLET | ORAL | Status: DC | PRN
  Administered 2012-12-09 – 2012-12-10 (×3): 2 via ORAL
  Filled 2012-12-09 (×3): qty 2

## 2012-12-09 MED ORDER — ONDANSETRON HCL 4 MG/2ML IJ SOLN: 4.0000 mg | INTRAMUSCULAR | Status: DC | PRN

## 2012-12-09 MED ORDER — PROPOFOL 10 MG/ML IV BOLUS
INTRAVENOUS | Status: DC | PRN
  Administered 2012-12-09: 130 mg via INTRAVENOUS

## 2012-12-09 MED ORDER — MENTHOL 3 MG MT LOZG
1.0000 | LOZENGE | OROMUCOSAL | Status: DC | PRN
  Filled 2012-12-09: qty 9

## 2012-12-09 MED ORDER — HYDROMORPHONE HCL PF 1 MG/ML IJ SOLN: 0.2500 mg | INTRAMUSCULAR | Status: DC | PRN

## 2012-12-09 MED ORDER — PROMETHAZINE HCL 25 MG/ML IJ SOLN: 12.5000 mg | INTRAMUSCULAR | Status: DC | PRN

## 2012-12-09 MED ORDER — SODIUM CHLORIDE 0.9 % IV SOLN
10.0000 mg | INTRAVENOUS | Status: DC | PRN
  Administered 2012-12-09: 10 ug/min via INTRAVENOUS

## 2012-12-09 MED ORDER — ALPRAZOLAM 0.25 MG PO TABS: 0.2500 mg | ORAL_TABLET | Freq: Two times a day (BID) | ORAL | Status: DC | PRN

## 2012-12-09 MED ORDER — 0.9 % SODIUM CHLORIDE (POUR BTL) OPTIME
TOPICAL | Status: DC | PRN
  Administered 2012-12-09: 1000 mL

## 2012-12-09 MED ORDER — ACETAMINOPHEN 650 MG RE SUPP: 650.0000 mg | RECTAL | Status: DC | PRN

## 2012-12-09 SURGICAL SUPPLY — 58 items
BAG DECANTER FOR FLEXI CONT (MISCELLANEOUS) ×2 IMPLANT
BANDAGE GAUZE ELAST BULKY 4 IN (GAUZE/BANDAGES/DRESSINGS) ×4 IMPLANT
BENZOIN TINCTURE PRP APPL 2/3 (GAUZE/BANDAGES/DRESSINGS) ×2 IMPLANT
BIT DRILL 2.3 12 FIXED (INSTRUMENTS) ×1 IMPLANT
BUR MATCHSTICK NEURO 3.0 LAGG (BURR) ×2 IMPLANT
CANISTER SUCTION 2500CC (MISCELLANEOUS) ×2 IMPLANT
CLOTH BEACON ORANGE TIMEOUT ST (SAFETY) ×2 IMPLANT
CONT SPEC 4OZ CLIKSEAL STRL BL (MISCELLANEOUS) ×2 IMPLANT
DRAPE LAPAROTOMY 100X72 PEDS (DRAPES) ×2 IMPLANT
DRAPE MICROSCOPE LEICA (MISCELLANEOUS) IMPLANT
DRAPE MICROSCOPE ZEISS OPMI (DRAPES) ×6 IMPLANT
DRAPE POUCH INSTRU U-SHP 10X18 (DRAPES) ×2 IMPLANT
DRAPE PROXIMA HALF (DRAPES) ×2 IMPLANT
DRILL 12MM (INSTRUMENTS) ×2
DURAPREP 6ML APPLICATOR 50/CS (WOUND CARE) ×2 IMPLANT
ELECT REM PT RETURN 9FT ADLT (ELECTROSURGICAL) ×2
ELECTRODE REM PT RTRN 9FT ADLT (ELECTROSURGICAL) ×1 IMPLANT
GAUZE SPONGE 4X4 16PLY XRAY LF (GAUZE/BANDAGES/DRESSINGS) IMPLANT
GLOVE ECLIPSE 7.5 STRL STRAW (GLOVE) IMPLANT
GLOVE EXAM NITRILE LRG STRL (GLOVE) ×2 IMPLANT
GLOVE EXAM NITRILE MD LF STRL (GLOVE) IMPLANT
GLOVE EXAM NITRILE XL STR (GLOVE) IMPLANT
GLOVE EXAM NITRILE XS STR PU (GLOVE) IMPLANT
GLOVE INDICATOR 7.5 STRL GRN (GLOVE) ×2 IMPLANT
GLOVE OPTIFIT SS 7.5 STRL LX (GLOVE) ×2 IMPLANT
GLOVE OPTIFIT SS 8.5 STRL (GLOVE) ×2 IMPLANT
GLOVE SURG SS PI 7.0 STRL IVOR (GLOVE) ×2 IMPLANT
GLOVE SURG SS PI 7.5 STRL IVOR (GLOVE) ×4 IMPLANT
GOWN BRE IMP SLV AUR LG STRL (GOWN DISPOSABLE) ×2 IMPLANT
GOWN BRE IMP SLV AUR XL STRL (GOWN DISPOSABLE) ×4 IMPLANT
GOWN STRL REIN 2XL LVL4 (GOWN DISPOSABLE) IMPLANT
HEAD HALTER (SOFTGOODS) ×2 IMPLANT
HEMOSTAT POWDER KIT SURGIFOAM (HEMOSTASIS) ×2 IMPLANT
KIT BASIN OR (CUSTOM PROCEDURE TRAY) ×2 IMPLANT
KIT ROOM TURNOVER OR (KITS) ×2 IMPLANT
NEEDLE HYPO 22GX1.5 SAFETY (NEEDLE) IMPLANT
NEEDLE HYPO 25X1 1.5 SAFETY (NEEDLE) ×2 IMPLANT
NEEDLE SPNL 20GX3.5 QUINCKE YW (NEEDLE) ×2 IMPLANT
NS IRRIG 1000ML POUR BTL (IV SOLUTION) ×2 IMPLANT
PACK LAMINECTOMY NEURO (CUSTOM PROCEDURE TRAY) ×2 IMPLANT
PAD ARMBOARD 7.5X6 YLW CONV (MISCELLANEOUS) ×6 IMPLANT
PATTIES SURGICAL .75X.75 (GAUZE/BANDAGES/DRESSINGS) ×2 IMPLANT
PIN DISTRACTION 14MM (PIN) ×4 IMPLANT
PLATE 34MM (Plate) ×2 IMPLANT
RUBBERBAND STERILE (MISCELLANEOUS) ×8 IMPLANT
SCREW 12MM (Screw) ×12 IMPLANT
SPACER ASSEM CERV LORD 8M (Spacer) ×2 IMPLANT
SPACER ASSEM CERV LORD 9M (Spacer) ×2 IMPLANT
SPONGE GAUZE 4X4 12PLY (GAUZE/BANDAGES/DRESSINGS) ×4 IMPLANT
SPONGE INTESTINAL PEANUT (DISPOSABLE) ×2 IMPLANT
STRIP CLOSURE SKIN 1/2X4 (GAUZE/BANDAGES/DRESSINGS) ×2 IMPLANT
SUT VIC AB 3-0 SH 8-18 (SUTURE) ×2 IMPLANT
SYR 20ML ECCENTRIC (SYRINGE) ×2 IMPLANT
TAPE CLOTH SURG 4X10 WHT LF (GAUZE/BANDAGES/DRESSINGS) ×2 IMPLANT
TOWEL OR 17X24 6PK STRL BLUE (TOWEL DISPOSABLE) ×4 IMPLANT
TOWEL OR 17X26 10 PK STRL BLUE (TOWEL DISPOSABLE) ×2 IMPLANT
TRAY FOLEY BAG SILVER LF 14FR (CATHETERS) ×4 IMPLANT
WATER STERILE IRR 1000ML POUR (IV SOLUTION) ×2 IMPLANT

## 2012-12-09 NOTE — Anesthesia Postprocedure Evaluation (Signed)
  Anesthesia Post-op Note  Patient: [name]  Procedure(s) Performed: Procedure(s): ANTERIOR CERVICAL DECOMPRESSION/DISCECTOMY FUSION STRUCTURAL ALLOGRAFT TRESTLE PLATE CERVICAL FIVE-SIX,SIX-SEVEN (N/A)  Patient Location: PACU  Anesthesia Type:General  Level of Consciousness: awake, alert  and oriented  Airway and Oxygen Therapy: Patient Spontanous Breathing and Patient connected to nasal cannula oxygen  Post-op Pain: mild  Post-op Assessment: Post-op Vital signs reviewed, Patient's Cardiovascular Status Stable, Respiratory Function Stable, Patent Airway, No signs of Nausea or vomiting and Pain level controlled  Post-op Vital Signs: stable  Complications: No apparent anesthesia complications

## 2012-12-09 NOTE — Op Note (Signed)
12/09/2012  2:48 PM  PATIENT:  [name]  27 y.o. female  PRE-OPERATIVE DIAGNOSIS:  Cervical Herniated Nucleus Pulposus C5-6, C6-7  POST-OPERATIVE DIAGNOSIS:  Cervical Herniated Nucleus Pulposus  C5-6, C6-7  PROCEDURE:  Procedure(s): ANTERIOR CERVICAL DECOMPRESSION/DISCECTOMY FUSION,  STRUCTURAL ALLOGRAFT , TRESTLE PLATE   CERVICAL FIVE-SIX,SIX-SEVEN  SURGEON:  Surgeon(s): Clydene Fake, MD Temple Pacini, MD-assist   ANESTHESIA:   general  EBL:  Total I/O In: 2500 [I.V.:2000; IV Piggyback:500] Out: 600 [Blood:600]  BLOOD ADMINISTERED:none  DRAINS: none   SPECIMEN:  No Specimen  DICTATION: Patient with long term history of the neck pain and pain rating Numbness tingling MRI was done showing disc herniation spinal change at 56 and 67 causing stenosis of the central canal and bilateral foramen at both levels patient's been through multiple ventricle management with the therapy medications with no improvement and the real to proceed with surgical intervention.   Patient brought in the operating room and general anesthesia induced patient was placed in 10 pounds halter traction prepped draped sterile fashion 7 incision injected with 10 cc 1% lidocaine with epinephrine. Incision was then made the midline to the anterior border of the sternocleidomastoid muscle the left-sided neck incision taken of the platysma hemostasis obtained with Bovie cauterization platysma was incised with a Bovie and blunt dissection taken to the anterior cervical fascia to the anterior cervical spine. Needles placed the 2 interspaces and x-rays obtained showing needles were at the four-part and notify 6 levels due to the shoulders and body habitus we could just see very the the needle going to 45 root in the bottom needle roots going into what would be and neck space 1 operative 5 6-6/7 the bottom needle was one was at the 56 level. When incised the space at 56 and a partial discectomy was removed the needles  and mobilized lungs: With the Bovie and soaking retractors were into a 6 and 67 disc spaces were incised with the spaces 56 and 6 with a 15 blade and continued to the partial discectomy pituitary rongeurs and curettes. Dressed right removed with Kerrison punches and distraction pins placed in C5 and C7. Microscope was brought in for microdissection this point and using a curettes and then went to more Kerrison punches we continued the discectomy removing posterior ligament osteophyte and disc material decompression central canal and performing bilateral foraminotomies. We were finished we did decompression central canal and bilateral foramen it hemostasis with Gelfoam thrombin. This was irrigated out we used high-speed drill to remove callus endplate this is to be 8 mm an 8 mm structural allograft was tapped in place. Attention then taken 67 level and discectomy can and was done just at the level above using a curettes and then went to more Kerrison punches to remove posterior disc osteophyte and ligament decompressed the central canal and performing bilateral foraminotomies which we did decompression hemostasis with Gelfoam thrombin this was irrigated out is high-speed drill to remove callus templated measured height a displaced to be 9 mm and 9 mm structural allograft bone was tapped in place there. With bone plugs were in good firm position we. About solution distraction and distraction pins removed weight was removed the traction again bone plugs were in good firm position a Tressel anterior cervical plate was placed with interested in spine 2 screws placed in C5-C6 and C7 these were final tightened lateral x-rays obtained and could see the plates and the top screws at C5 and into the bone plug at 56  and the screws into 6 and just a female plate-screw bone at the to could not visualize the spine due to the body habitus was in good position of placed we could see an x-ray an intraoperative good. Tractors removed.  About solution we very good hemostasis platysma closed through Vicryl interrupted sutures of his tissue closed the same skin closed benzoin Steri-Strips dressing was placed patient placed back in the placed Sulzer collar woken from anesthesia and transferred to recovery  PLAN OF CARE: Admit to inpatient   PATIENT DISPOSITION:  PACU - hemodynamically stable.

## 2012-12-09 NOTE — Preoperative (Signed)
Beta Blockers   Reason not to administer Beta Blockers:Not Applicable 

## 2012-12-09 NOTE — Plan of Care (Signed)
Problem: Consults Goal: Diagnosis - Spinal Surgery Outcome: Completed/Met Date Met:  12/09/12 Cervical Spine Fusion

## 2012-12-09 NOTE — Anesthesia Procedure Notes (Signed)
Procedure Name: Intubation Date/Time: 12/09/2012 11:54 AM Performed by: Leona Singleton A Pre-anesthesia Checklist: Patient identified, Emergency Drugs available, Suction available and Patient being monitored Patient Re-evaluated:Patient Re-evaluated prior to inductionOxygen Delivery Method: Circle system utilized Preoxygenation: Pre-oxygenation with 100% oxygen Intubation Type: IV induction Ventilation: Mask ventilation without difficulty Laryngoscope Size: Miller and 2 Grade View: Grade I Tube type: Oral Tube size: 7.5 mm Number of attempts: 1 Airway Equipment and Method: Stylet Placement Confirmation: ETT inserted through vocal cords under direct vision,  positive ETCO2 and breath sounds checked- equal and bilateral Secured at: 23 cm Tube secured with: Tape Dental Injury: Teeth and Oropharynx as per pre-operative assessment

## 2012-12-09 NOTE — Interval H&P Note (Signed)
History and Physical Interval Note:  12/09/2012 11:50 AM  [name]  has presented today for surgery, with the diagnosis of Cervical HNP  The various methods of treatment have been discussed with the patient and family. After consideration of risks, benefits and other options for treatment, the patient has consented to  Procedure(s) with comments: ANTERIOR CERVICAL DECOMPRESSION/DISCECTOMY FUSION 2 LEVELS (N/A) - C5-6 C6-7 Anterior cervical decompression/diskectomy/fusion/structural allograft/Trestle plate as a surgical intervention .  The patient's history has been reviewed, patient examined, no change in status, stable for surgery.  I have reviewed the patient's chart and labs.  Questions were answered to the patient's satisfaction.     [name] R

## 2012-12-09 NOTE — H&P (Signed)
See H& P.

## 2012-12-09 NOTE — Anesthesia Preprocedure Evaluation (Signed)
Anesthesia Evaluation  Patient identified by MRN, date of birth, ID band Patient awake    Reviewed: Allergy & Precautions, H&P , NPO status , Patient's Chart, lab work & pertinent test results  Airway Mallampati: II TM Distance: >3 FB Neck ROM: Full    Dental  (+) Poor Dentition, Loose and Dental Advisory Given,    Pulmonary  breath sounds clear to auscultation        Cardiovascular Rhythm:Regular Rate:Normal     Neuro/Psych    GI/Hepatic   Endo/Other    Renal/GU      Musculoskeletal   Abdominal   Peds  Hematology   Anesthesia Other Findings   Reproductive/Obstetrics                           Anesthesia Physical Anesthesia Plan  ASA: II  Anesthesia Plan: General   Post-op Pain Management:    Induction: Intravenous  Airway Management Planned: Oral ETT  Additional Equipment:   Intra-op Plan:   Post-operative Plan: Extubation in OR  Informed Consent: I have reviewed the patients History and Physical, chart, labs and discussed the procedure including the risks, benefits and alternatives for the proposed anesthesia with the patient or authorized representative who has indicated his/her understanding and acceptance.   Dental advisory given  Plan Discussed with: CRNA and Anesthesiologist  Anesthesia Plan Comments: (HNP C5-6 C6-7 Obesity Smoker Anxiety Migraines Chronic low back pain Asthma lungs clear  Plan GA with oral ETT  Kipp Brood, MD)        Anesthesia Quick Evaluation

## 2012-12-09 NOTE — Transfer of Care (Signed)
Immediate Anesthesia Transfer of Care Note  Patient: [name]  Procedure(s) Performed: Procedure(s): ANTERIOR CERVICAL DECOMPRESSION/DISCECTOMY FUSION STRUCTURAL ALLOGRAFT TRESTLE PLATE CERVICAL FIVE-SIX,SIX-SEVEN (N/A)  Patient Location: PACU  Anesthesia Type:General  Level of Consciousness: awake, alert , oriented and sedated  Airway & Oxygen Therapy: Patient Spontanous Breathing and Patient connected to nasal cannula oxygen  Post-op Assessment: Report given to PACU RN, Post -op Vital signs reviewed and stable and Patient moving all extremities  Post vital signs: Reviewed and stable  Complications: No apparent anesthesia complications

## 2012-12-10 ENCOUNTER — Telehealth: Payer: Self-pay | Admitting: Internal Medicine

## 2012-12-10 ENCOUNTER — Other Ambulatory Visit: Payer: Self-pay | Admitting: Internal Medicine

## 2012-12-10 MED ORDER — METHOCARBAMOL 500 MG PO TABS: 750.0000 mg | ORAL_TABLET | Freq: Four times a day (QID) | ORAL | Status: DC | PRN

## 2012-12-10 MED ORDER — OXYCODONE-ACETAMINOPHEN 5-325 MG PO TABS: 1.0000 | ORAL_TABLET | ORAL | Status: DC | PRN

## 2012-12-10 NOTE — Discharge Summary (Signed)
Physician Discharge Summary  Patient ID: [name] MRN: [id number] DOB/AGE: [birth year] 28 y.o.  Admit date: 12/09/2012 Discharge date: 12/10/2012  Admission Diagnoses:Cervical Herniated Nucleus Pulposus C5-6, C6-7  Discharge Diagnoses: Cervical Herniated Nucleus Pulposus C5-6, C6-7  Active Problems:   * No active hospital problems. *   Discharged Condition: good  Hospital Course: pt admitted on day of surgery  - underwent procedure below  - pt doing well , much less arm/neck pain, less numbness  - eating , voice ok, ambulating, voiding  Consults: None    Treatments: surgery: ANTERIOR CERVICAL DECOMPRESSION/DISCECTOMY FUSION, STRUCTURAL ALLOGRAFT , TRESTLE PLATE CERVICAL FIVE-SIX,SIX-SEVEN   Discharge Exam: Blood pressure 99/68, pulse 80, temperature 98.7 F (37.1 C), temperature source Oral, resp. rate 18, height 5\' 4"  (1.626 m), weight 118.842 kg (262 lb), SpO2 94.00%. Wound:c/d/i  Disposition: home   Future Appointments Provider Department Dept Phone   12/22/2012 2:00 PM [facility] Covering Provider [facility] [phone]   12/31/2012 10:30 AM [facility] Financial Counselor [facility] [phone]   12/31/2012 11:00 AM [facility] Covering Provider [facility] [phone]       Medication List    STOP taking these medications       diclofenac 50 MG tablet  Commonly known as:  CATAFLAM      TAKE these medications       albuterol 108 (90 BASE) MCG/ACT inhaler  Commonly known as:  PROVENTIL HFA;VENTOLIN HFA  Inhale 2 puffs into the lungs every 6 (six) hours as needed for wheezing.     ALPRAZolam 0.25 MG tablet  Commonly known as:  XANAX  Take 1 tablet (0.25 mg total) by mouth 2 (two) times daily as needed for sleep or anxiety.     ARIPiprazole 5 MG tablet  Commonly known as:  ABILIFY  Take 5 mg by mouth daily.     CALCIUM-VITAMIN D PO  Take 1 capsule by mouth  daily.     cyclobenzaprine 10 MG tablet  Commonly known as:  FLEXERIL  Take 1 tablet (10 mg total) by mouth 3 (three) times daily as needed for muscle spasms.     fluticasone 50 MCG/ACT nasal spray  Commonly known as:  FLONASE  Place 2 sprays into the nose daily.     medroxyPROGESTERone 150 MG/ML injection  Commonly known as:  DEPO-PROVERA  Inject 150 mg into the muscle every 3 (three) months. Next dose due 12/10/2012     methocarbamol 500 MG tablet  Commonly known as:  ROBAXIN  Take 1.5 tablets (750 mg total) by mouth every 6 (six) hours as needed.     omeprazole 20 MG capsule  Commonly known as:  PRILOSEC  Take 20 mg by mouth daily.     oxyCODONE-acetaminophen 5-325 MG per tablet  Commonly known as:  PERCOCET/ROXICET  Take 1-2 tablets by mouth every 4 (four) hours as needed.     PROZAC 20 MG capsule  Generic drug:  FLUoxetine  Take 60 mg by mouth every morning.     traZODone 50 MG tablet  Commonly known as:  DESYREL  Take 50 mg by mouth at bedtime.     WOMENS ONE DAILY PO  Take 1 capsule by mouth.     zolpidem 10 MG tablet  Commonly known as:  AMBIEN  Take 10 mg by mouth at bedtime as needed for sleep.         SignedClydene Fake, MD 12/10/2012, 8:47 AM

## 2012-12-10 NOTE — Telephone Encounter (Signed)
Pt was here on 12/05/12 and received script for ALPRAZolam (XANAX) 0.25 MG tablet. Pt was referred to Dr Phoebe Perch, from [facility] and per Dr Phoebe Perch, he will not fill Xanax because he did not initially prescribe med.

## 2012-12-10 NOTE — Progress Notes (Signed)
Pt. discharged home accompanied by husband. Prescriptions and discharge instructions given with verbalization of understanding. Incision site on neck with no s/s of infection - no swelling, redness, bleeding, and/or drainage noted. Soft collar intact. Pain med given just before leaving. Opportunity given to ask questions but no question asked. Pt. transported out of this unit in wheelchair by the nurse tech  

## 2012-12-11 ENCOUNTER — Encounter (HOSPITAL_COMMUNITY): Payer: Self-pay | Admitting: Neurosurgery

## 2012-12-11 NOTE — Progress Notes (Signed)
Utilization review completed. Venkat Ankney, RN, BSN. 

## 2012-12-19 ENCOUNTER — Other Ambulatory Visit: Payer: Self-pay | Admitting: Internal Medicine

## 2012-12-22 ENCOUNTER — Telehealth: Payer: Self-pay | Admitting: Internal Medicine

## 2012-12-22 ENCOUNTER — Ambulatory Visit: Payer: Medicaid Other

## 2012-12-22 NOTE — Telephone Encounter (Signed)
Pt called in today to see if she can get a refill on scripts omeprazole (PRILOSEC) 20 MG capsule and tramadol; pt had an appt for this afternoon but is unable to find transportation;

## 2012-12-30 ENCOUNTER — Other Ambulatory Visit: Payer: Self-pay | Admitting: Internal Medicine

## 2012-12-30 DIAGNOSIS — D649 Anemia, unspecified: Secondary | ICD-10-CM

## 2012-12-30 DIAGNOSIS — D72829 Elevated white blood cell count, unspecified: Secondary | ICD-10-CM

## 2012-12-31 ENCOUNTER — Encounter (INDEPENDENT_AMBULATORY_CARE_PROVIDER_SITE_OTHER): Payer: Self-pay

## 2012-12-31 ENCOUNTER — Ambulatory Visit (HOSPITAL_BASED_OUTPATIENT_CLINIC_OR_DEPARTMENT_OTHER): Payer: Medicaid Other | Admitting: Internal Medicine

## 2012-12-31 ENCOUNTER — Encounter: Payer: Self-pay | Admitting: Internal Medicine

## 2012-12-31 ENCOUNTER — Ambulatory Visit: Payer: Medicaid Other

## 2012-12-31 ENCOUNTER — Ambulatory Visit (HOSPITAL_BASED_OUTPATIENT_CLINIC_OR_DEPARTMENT_OTHER): Payer: Medicaid Other | Admitting: Lab

## 2012-12-31 VITALS — BP 111/62 | HR 108 | Temp 98.0°F | Resp 18 | Ht 64.0 in | Wt 265.9 lb

## 2012-12-31 DIAGNOSIS — D649 Anemia, unspecified: Secondary | ICD-10-CM

## 2012-12-31 DIAGNOSIS — M549 Dorsalgia, unspecified: Secondary | ICD-10-CM

## 2012-12-31 DIAGNOSIS — D72829 Elevated white blood cell count, unspecified: Secondary | ICD-10-CM

## 2012-12-31 DIAGNOSIS — G894 Chronic pain syndrome: Secondary | ICD-10-CM

## 2012-12-31 DIAGNOSIS — E669 Obesity, unspecified: Secondary | ICD-10-CM

## 2012-12-31 DIAGNOSIS — Z720 Tobacco use: Secondary | ICD-10-CM | POA: Insufficient documentation

## 2012-12-31 DIAGNOSIS — F17200 Nicotine dependence, unspecified, uncomplicated: Secondary | ICD-10-CM

## 2012-12-31 LAB — CBC & DIFF AND RETIC
Basophils Absolute: 0 10*3/uL (ref 0.0–0.1)
EOS%: 2.8 % (ref 0.0–7.0)
Eosinophils Absolute: 0.4 10*3/uL (ref 0.0–0.5)
HCT: 33.6 % — ABNORMAL LOW (ref 34.8–46.6)
HGB: 11 g/dL — ABNORMAL LOW (ref 11.6–15.9)
Immature Retic Fract: 16.7 % — ABNORMAL HIGH (ref 1.60–10.00)
MCH: 26.9 pg (ref 25.1–34.0)
MONO#: 0.8 10*3/uL (ref 0.1–0.9)
NEUT#: 7.8 10*3/uL — ABNORMAL HIGH (ref 1.5–6.5)
NEUT%: 60 % (ref 38.4–76.8)
RDW: 15 % — ABNORMAL HIGH (ref 11.2–14.5)
Retic %: 1.94 % (ref 0.70–2.10)
Retic Ct Abs: 79.35 10*3/uL (ref 33.70–90.70)
WBC: 12.9 10*3/uL — ABNORMAL HIGH (ref 3.9–10.3)
lymph#: 4 10*3/uL — ABNORMAL HIGH (ref 0.9–3.3)

## 2012-12-31 LAB — FERRITIN CHCC: Ferritin: 27 ng/ml (ref 9–269)

## 2012-12-31 LAB — IRON AND TIBC CHCC: %SAT: 6 % — ABNORMAL LOW (ref 21–57)

## 2012-12-31 MED ORDER — OXYCODONE-ACETAMINOPHEN 5-325 MG PO TABS: 1.0000 | ORAL_TABLET | ORAL | Status: DC | PRN

## 2012-12-31 NOTE — Patient Instructions (Signed)
Anemia, Frequently Asked Questions WHAT ARE THE SYMPTOMS OF ANEMIA?  Headache.  Difficulty thinking.  Fatigue.  Shortness of breath.  Weakness.  Rapid heartbeat. AT WHAT POINT ARE PEOPLE CONSIDERED ANEMIC?  This varies with gender and age.   Both hemoglobin (Hgb) and hematocrit values are used to define anemia. These lab values are obtained from a complete blood count (CBC) test. This is performed at a caregiver's office.  The normal range of hemoglobin values for adult men is 14.0 g/dL to 17.4 g/dL. For nonpregnant women, values are 12.3 g/dL to 15.3 g/dL.  The World Health Organization defines anemia as less than 12 g/dL for nonpregnant women and less than 13 g/dL for men.  For adult males, the average normal hematocrit is 46%, and the range is 40% to 52%.  For adult females, the average normal hematocrit is 41%, and the range is 35% to 47%.  Values that fall below the lower limits can be a sign of anemia and should have further checking (evaluation). GROUPS OF PEOPLE WHO ARE AT RISK FOR DEVELOPING ANEMIA INCLUDE:   Infants who are breastfed or taking a formula that is not fortified with iron.  Children going through a rapid growth spurt. The iron available can not keep up with the needs for a red cell mass which must grow with the child.  Women in childbearing years. They need iron because of blood loss during menstruation.  Pregnant women. The growing fetus creates a high demand for iron.  People with ongoing gastrointestinal blood loss are at risk of developing iron deficiency.  Individuals with leukemia or cancer who must receive chemotherapy or radiation to treat their disease. The drugs or radiation used to treat these diseases often decreases the bone marrow's ability to make cells of all classes. This includes red blood cells, white blood cells, and platelets.  Individuals with chronic inflammatory conditions such as rheumatoid arthritis or chronic  infections.  The elderly. ARE SOME TYPES OF ANEMIA INHERITED?   Yes, some types of anemia are due to inherited or genetic defects.  Sickle cell anemia. This occurs most often in people of African, African American, and Mediterranean descent.  Thalassemia (or Cooley's anemia). This type is found in people of Mediterranean and Southeast Asian descent. These types of anemia are common.  [place]. This is rare. CAN CERTAIN MEDICATIONS CAUSE A PERSON TO BECOME ANEMIC?  Yes. For example, drugs to fight cancer (chemotherapeutic agents) often cause anemia. These drugs can slow the bone marrow's ability to make red blood cells. If there are not enough red blood cells, the body does not get enough oxygen. WHAT HEMATOCRIT LEVEL IS REQUIRED TO DONATE BLOOD?  The lower limit of an acceptable hematocrit for blood donors is 38%. If you have a low hematocrit value, you should schedule an appointment with your caregiver. ARE BLOOD TRANSFUSIONS COMMONLY USED TO CORRECT ANEMIA, AND ARE THEY DANGEROUS?  They are used to treat anemia as a last resort. Your caregiver will find the cause of the anemia and correct it if possible. Most blood transfusions are given because of excessive bleeding at the time of surgery, with trauma, or because of bone marrow suppression in patients with cancer or leukemia on chemotherapy. Blood transfusions are safer than ever before. We also know that blood transfusions affect the immune system and may increase certain risks. There is also a concern for human error. In 1/16,000 transfusions, a patient receives a transfusion of blood that is not matched with his or her   blood type.  WHAT IS IRON DEFICIENCY ANEMIA AND CAN I CORRECT IT BY CHANGING MY DIET?  Iron is an essential part of hemoglobin. Without enough hemoglobin, anemia develops and the body does not get the right amount of oxygen. Iron deficiency anemia develops after the body has had a low level of iron for a long time. This is  either caused by blood loss, not taking in or absorbing enough iron, or increased demands for iron (like pregnancy or rapid growth).  Foods from animal origin such as beef, chicken, and pork, are good sources of iron. Be sure to have one of these foods at each meal. Vitamin C helps your body absorb iron. Foods rich in Vitamin C include citrus, bell pepper, strawberries, spinach and cantaloupe. In some cases, iron supplements may be needed in order to correct the iron deficiency. In the case of poor absorption, extra iron may have to be given directly into the vein through a needle (intravenously). I HAVE BEEN DIAGNOSED WITH IRON DEFICIENCY ANEMIA AND MY CAREGIVER PRESCRIBED IRON SUPPLEMENTS. HOW LONG WILL IT TAKE FOR MY BLOOD TO BECOME NORMAL?  It depends on the degree of anemia at the beginning of treatment. Most people with mild to moderate iron deficiency, anemia will correct the anemia over a period of 2 to 3 months. But after the anemia is corrected, the iron stored by the body is still low. Caregivers often suggest an additional 6 months of oral iron therapy once the anemia has been reversed. This will help prevent the iron deficiency anemia from quickly happening again. Non-anemic adult males should take iron supplements only under the direction of a doctor, too much iron can cause liver damage.  MY HEMOGLOBIN IS 9 G/DL AND I AM SCHEDULED FOR SURGERY. SHOULD I POSTPONE THE SURGERY?  If you have Hgb of 9, you should discuss this with your caregiver right away. Many patients with similar hemoglobin levels have had surgery without problems. If minimal blood loss is expected for a minor procedure, no treatment may be necessary.  If a greater blood loss is expected for more extensive procedures, you should ask your caregiver about being treated with erythropoietin and iron. This is to accelerate the recovery of your hemoglobin to a normal level before surgery. An anemic patient who undergoes high-blood-loss  surgery has a greater risk of surgical complications and need for a blood transfusion, which also carries some risk.  I HAVE BEEN TOLD THAT HEAVY MENSTRUAL PERIODS CAUSE ANEMIA. IS THERE ANYTHING I CAN DO TO PREVENT THE ANEMIA?  Anemia that results from heavy periods is usually due to iron deficiency. You can try to meet the increased demands for iron caused by the heavy monthly blood loss by increasing the intake of iron-rich foods. Iron supplements may be required. Discuss your concerns with your caregiver. WHAT CAUSES ANEMIA DURING PREGNANCY?  Pregnancy places major demands on the body. The mother must meet the needs of both her body and her growing baby. The body needs enough iron and folate to make the right amount of red blood cells. To prevent anemia while pregnant, the mother should stay in close contact with her caregiver.  Be sure to eat a diet that has foods rich in iron and folate like liver and dark green leafy vegetables. Folate plays an important role in the normal development of a baby's spinal cord. Folate can help prevent serious disorders like spina bifida. If your diet does not provide adequate nutrients, you may want to talk   with your caregiver about nutritional supplements.  WHAT IS THE RELATIONSHIP BETWEEN FIBROID TUMORS AND ANEMIA IN WOMEN?  The relationship is usually caused by the increased menstrual blood loss caused by fibroids. Good iron intake may be required to prevent iron deficiency anemia from developing.  Document Released: 10/19/2003 Document Revised: 06/04/2011 Document Reviewed: 04/04/2010 [facility] Patient Information 2014 [place], [place]. Leukocytosis Leukocytosis means you have more white blood cells than normal. White blood cells are made in your bone marrow. The main job of white blood cells is to fight infection. Having too many white blood cells is a common condition. It can develop as a result of many types of medical problems. CAUSES  In some cases, your  bone marrow may be normal, but it is still making too many white blood cells. This could be the result of:  Infection.  Injury.  Physical stress.  Emotional stress.  Surgery.  Allergic reactions.  Tumors that do not start in the blood or bone marrow.  An inherited disease.  Certain medicines.  Pregnancy and labor. In other cases, you may have a bone marrow disorder that is causing your body to make too many white blood cells. Bone marrow disorders include:  Leukemia. This is a type of blood cancer.  Myeloproliferative disorders. These disorders cause blood cells to grow abnormally. SYMPTOMS  Some people have no symptoms. Others have symptoms due to the medical problem that is causing their leukocytosis. These symptoms may include:  Bleeding.  Bruising.  Fever.  Night sweats.  Repeated infections.  Weakness.  Weight loss. DIAGNOSIS  Leukocytosis is often found during blood tests that are done as part of a normal physical exam. Your caregiver will probably order other tests to help determine why you have too many white blood cells. These tests may include:  A complete blood count (CBC). This test measures all the types of blood cells in your body.  Chest X-rays, urine tests (urinalysis), or other tests to look for signs of infection.  Bone marrow aspiration. For this test, a needle is put into your bone. Cells from the bone marrow are removed through the needle. The cells are then examined under a microscope. TREATMENT  Treatment is usually not needed for leukocytosis. However, if a disorder is causing your leukocytosis, it will need to be treated. Treatment may include:  Antibiotic medicines if you have a bacterial infection.  Bone marrow transplant. Your diseased bone marrow is replaced with healthy cells that will grow new bone marrow.  Chemotherapy. This is the use of drugs to kill cancer cells. HOME CARE INSTRUCTIONS  Only take over-the-counter or  prescription medicines as directed by your caregiver.  Maintain a healthy weight. Ask your caregiver what weight is best for you.  Eat foods that are low in saturated fats and high in fiber. Eat plenty of fruits and vegetables.  Drink enough fluids to keep your urine clear or pale yellow.  Get 30 minutes of exercise at least 5 times a week. Check with your caregiver before starting a new exercise routine.  Limit caffeine and alcohol.  Do not smoke.  Keep all follow-up appointments as directed by your caregiver. SEEK MEDICAL CARE IF:  You feel weak or more tired than usual.  You develop chills, a cough, or nasal congestion.  You lose weight without trying.  You have night sweats.  You bruise easily. SEEK IMMEDIATE MEDICAL CARE IF:  You bleed more than normal.  You have chest pain.  You have trouble breathing.  You have a fever.  You have uncontrolled nausea or vomiting.  You feel dizzy or lightheaded. MAKE SURE YOU:  Understand these instructions.  Will watch your condition.  Will get help right away if you are not doing well or get worse. Document Released: 03/01/2011 Document Revised: 06/04/2011 Document Reviewed: 03/01/2011 [facility] Patient Information 2014 [place], [place].

## 2013-01-01 NOTE — Progress Notes (Signed)
[facility] [phone]   [phone]  NEW PATIENT EVALUATION   Name: [name] Date: 01/01/2013 MRN: [id number] DOB: June 06, 1985  PCP: Jeanann Lewandowsky, MD   REFERRING PHYSICIAN: Jeanann Lewandowsky, MD  REASON FOR REFERRAL: Leukocytosis    HISTORY OF PRESENT ILLNESS:Anndee Gandolfi is a 27 y.o. female who is morbidly obese with a hit wrist a history of neck surgery status post C6/C7 fusion percent for followup for leukocytosis. He was last seen by her primary care physician on 12/01/2012. She reports long-standing chronic pain syndrome consisting of neck and right arm pain which has been ongoing for the past several months. As a result an MRI of cervical spine  was done revealing a small broad-based soft disc protrusion at C5/C6 and C6/C7 slightly asymmetric to the right with these protrusions extending into the lateral recesses and could affect the C6 and C7 nerves bilaterally. As a result she underwent C6/C7 fusion neck surgery on 12/10/2012. She reports no complications to the surgery.  Her last visit with her primary care physician, she was told about an elevated white blood cell count of 12.2; hemoglobin of 11.5 and hematocrit of 33.9 with normal platelets of 286. She denies any history of elevated white counts. Though she does report taking steroids for her back problems about one month and a half ago. She has a positive smoker with less than one half a pack per day since the age of 27 years old. He reports gaining over 30 pounds over the last 6 months. She denies any fever chills or shortness of breath. She also endorses taking depot shot every 3 months with her last one being today. He has 3 children one of her pregnancies resulted in a premature birth for unclear reason.  He also suffers from occasional migraine headaches. He reports having a history of an abnormal Pap smear resulted in multiple cervical LEEP procedures and colposcopies.   She  denies any hemoptysis, hematochezia, easy bruising, fever, night sweats, repeated infections, or weight loss.  PAST MEDICAL HISTORY:  has a past medical history of Scoliosis; Obesity; Migraine; Anxiety; Gallstones; Asthma; Arthritis; Pelvic inflammatory disease (PID) (05-08-11); Leukocytosis; History of renal failure; Edema; and History of anemia.     PAST SURGICAL HISTORY: Past Surgical History  Procedure Laterality Date  . Abdominal exploration surgery  approx 27 years old    rlq(due to adhesions around ovaries)-appendix and ovaries remain  . Childbirth    . Cholecystectomy  05/10/2011    Procedure: LAPAROSCOPIC CHOLECYSTECTOMY WITH INTRAOPERATIVE CHOLANGIOGRAM;  Surgeon: Atilano Ina, MD,FACS;  Location: [facility];  Service: General;  Laterality: N/A;  . Anterior cervical decomp/discectomy fusion N/A 12/09/2012    Procedure: ANTERIOR CERVICAL DECOMPRESSION/DISCECTOMY FUSION STRUCTURAL ALLOGRAFT TRESTLE PLATE CERVICAL FIVE-SIX,SIX-SEVEN;  Surgeon: Clydene Fake, MD;  Location: [facility];  Service: Neurosurgery;  Laterality: N/A;     CURRENT MEDICATIONS: has a current medication list which includes the following prescription(s): albuterol, alprazolam, aripiprazole, calcium-vitamin d, cyclobenzaprine, fluoxetine, fluticasone, medroxyprogesterone, methocarbamol, multiple vitamins-minerals, omeprazole, oxycodone-acetaminophen, trazodone, and zolpidem.   ALLERGIES: Banana and Latex   SOCIAL HISTORY:  reports that she has been smoking Cigarettes.  She has a 28 pack-year smoking history. She has never used smokeless tobacco. She reports that she drinks alcohol. She reports that she uses illicit drugs (Marijuana).   FAMILY HISTORY: family history includes Cancer in her maternal grandfather and mother; Cirrhosis in her paternal grandfather; Colon polyps in her maternal grandmother; Diabetes in her father and mother;  Endometriosis in her mother; Heart disease in her father; Prostate cancer in her  maternal grandfather.   LABORATORY DATA:  CBC    Component Value Date/Time   WBC 12.9* 12/31/2012 1210   WBC 10.6* 12/09/2012 0929   RBC 4.09 12/31/2012 1210   RBC 4.06 12/09/2012 0929   HGB 11.0* 12/31/2012 1210   HGB 11.4* 12/09/2012 0929   HCT 33.6* 12/31/2012 1210   HCT 34.3* 12/09/2012 0929   PLT 319 12/31/2012 1210   PLT 287 12/09/2012 0929   MCV 82.2 12/31/2012 1210   MCV 84.5 12/09/2012 0929   MCH 26.9 12/31/2012 1210   MCH 28.1 12/09/2012 0929   MCHC 32.7 12/31/2012 1210   MCHC 33.2 12/09/2012 0929   RDW 15.0* 12/31/2012 1210   RDW 15.4 12/09/2012 0929   LYMPHSABS 4.0* 12/31/2012 1210   LYMPHSABS 3.2 12/09/2012 0929   MONOABS 0.8 12/31/2012 1210   MONOABS 0.7 12/09/2012 0929   EOSABS 0.4 12/31/2012 1210   EOSABS 0.4 12/09/2012 0929   BASOSABS 0.0 12/31/2012 1210   BASOSABS 0.0 12/09/2012 0929   CMP     Component Value Date/Time   NA 136 12/09/2012 0929   K 4.5 12/09/2012 0929   CL 103 12/09/2012 0929   CO2 21 12/09/2012 0929   GLUCOSE 107* 12/09/2012 0929   BUN 17 12/09/2012 0929   CREATININE 0.81 12/09/2012 0929   CREATININE 0.77 10/28/2012 1809   CALCIUM 9.0 12/09/2012 0929   PROT 6.3 10/28/2012 1809   ALBUMIN 3.6 10/28/2012 1809   AST 17 10/28/2012 1809   ALT 18 10/28/2012 1809   ALKPHOS 63 10/28/2012 1809   BILITOT 0.2* 10/28/2012 1809   GFRNONAA >90 12/09/2012 0929   GFRAA >90 12/09/2012 0929    Iron/TIBC/Ferritin    Component Value Date/Time   IRON 20* 12/31/2012 1210   TIBC 351 12/31/2012 1210   FERRITIN 27 12/31/2012 1210   Results for ELEONOR, OCON (MRN [id number]) as of 01/01/2013 16:21  Ref. Range 12/31/2012 12:10  Retic % Latest Range: 0.70-2.10 % 1.94  Retic Ct Abs Latest Range: 33.70-90.70 10e3/uL 79.35   RADIOGRAPHY: Dg Cervical Spine 2-3 Views  12/09/2012   CLINICAL DATA:  ACDF C5-6, C6-7.  EXAM: CERVICAL SPINE - 2-3 VIEW  COMPARISON:  MRI and 11/27/2012.  FINDINGS: 1st lateral intraoperative image demonstrates anterior localizing instruments directed at C4-5 and C5-6.  2nd  lateral intraoperative image demonstrates 2 level anterior fusion from C5-C7. The C6 and C7 vertebral bodies cannot be visualized due to overlying shoulders.  IMPRESSION: C5-C7 ACDF.   Electronically Signed   By: Charlett Nose M.D.   On: 12/09/2012 14:45    REVIEW OF SYSTEMS:  Constitutional: Denies fevers, chills or abnormal weight loss Eyes: Denies blurriness of vision Ears, nose, mouth, throat, and face: Denies mucositis or sore throat Respiratory: Denies cough, dyspnea or wheezes Cardiovascular: Denies palpitation, chest discomfort or lower extremity swelling Gastrointestinal:  Denies nausea, heartburn or change in bowel habits Skin: Denies abnormal skin rashes Lymphatics: Denies new lymphadenopathy or easy bruising Neurological:Denies numbness, tingling or new weaknesses Behavioral/Psych: Mood is stable, no new changes  All other systems were reviewed with the patient and are negative.  PHYSICAL EXAM:  height is 5\' 4"  (1.626 m) and weight is 265 lb 14.4 oz (120.611 kg). Her oral temperature is 98 F (36.7 C). Her blood pressure is 111/62 and her pulse is 108. Her respiration is 18 and oxygen saturation is 100%.    GENERAL:alert, no distress and comfortable; morbidly obese SKIN:  skin color, texture, turgor are normal, no rashes or significant lesions EYES: normal, Conjunctiva are pink and non-injected, sclera clear OROPHARYNX:no exudate, no erythema and lips, buccal mucosa, and tongue normal  NECK: supple, thyroid normal size, non-tender, without nodularity; left neck scar.  LYMPH:  no palpable lymphadenopathy in the cervical, axillary or inguinal LUNGS: clear to auscultation and percussion with normal breathing effort HEART: regular rate & rhythm and no murmurs and no lower extremity edema ABDOMEN:abdomen soft, non-tender and normal bowel sounds Musculoskeletal:no cyanosis of digits and no clubbing  NEURO: alert & oriented x 3 with fluent speech, no focal motor/sensory deficits     IMPRESSION: [name] is a 27 y.o. female with a history of morbid obesity, tobacco abuse, status-post neck surgery (12/10/2012) presents with leukocytosis not otherwise specified and anemia.  PLAN: 1.  Leukocytosis not otherwise specified. --Examining her CBC over the past one year reveals a normal white blood cell count one month ago on 10/28/2012 at 8.9; also one year ago on 05/10/2011 her white blood cell count was 9.  Leukocytosis could clearly be related to her smoking versus postoperative inflammation versus other stressors and/or medicines.  He denies using any steroids recently.  We repeated her CBC today and they reveal white blood cell count of 12.9. We recommended the patient stop smoking and we will repeat her labs in one month.  We will review a peripheral smear (addendum to be added).  She was also provided a handout on leukocytosis.  2. Anemia. --She may have a mild iron deficiency based on a lower ferritin of 20 and a high RDW, low reticulocyte count. However, she denies any menstrual periods while being on the Depo shot. We will repeat her CBC and iron studies in one month. If her ferritin continues to decrease we will start her on ferrous sulfate 325 mg daily. She was counseled extensively on symptoms of anemia including and not limited to palpitations, dyspnea on exertion, shortness of breath and worsening fatigue. She was also provided a handout on symptoms of anemia.  3. Tobacco Abuse. --Patient was also advised to stop smoking as this may contribute in part to #1. She did not desire tobacco counseling referral presently, but she will call if she changes her mind.    All questions were answered. The patient knows to call the clinic with any problems, questions or concerns. We can certainly see the patient much sooner if necessary. She was also provided and after visit summary.  I spent 25 minutes counseling the patient face to face. The total time spent in the appointment  was 45 minutes.    Jaquelynn Wanamaker, MD 01/01/2013 4:19 PM

## 2013-01-02 ENCOUNTER — Telehealth: Payer: Self-pay | Admitting: Internal Medicine

## 2013-01-02 NOTE — Telephone Encounter (Signed)
Cal and AVS and letter mailed to pt shh

## 2013-01-02 NOTE — Telephone Encounter (Signed)
lvmm adv of lab and ov appts in Jan 2015 shh

## 2013-01-09 ENCOUNTER — Other Ambulatory Visit: Payer: Self-pay | Admitting: Internal Medicine

## 2013-01-09 DIAGNOSIS — D649 Anemia, unspecified: Secondary | ICD-10-CM

## 2013-01-09 MED ORDER — FERROUS SULFATE 325 (65 FE) MG PO TABS: 325.0000 mg | ORAL_TABLET | Freq: Every day | ORAL | Status: DC

## 2013-04-01 ENCOUNTER — Ambulatory Visit: Payer: Medicaid Other

## 2013-04-01 ENCOUNTER — Other Ambulatory Visit: Payer: Self-pay

## 2013-04-01 ENCOUNTER — Other Ambulatory Visit: Payer: Medicaid Other

## 2013-04-02 ENCOUNTER — Telehealth: Payer: Self-pay | Admitting: Internal Medicine

## 2013-04-02 NOTE — Telephone Encounter (Signed)
[facility] advising the pt of her r/s jan 2015 appts that she missed on 04/01/2013.

## 2013-04-03 ENCOUNTER — Telehealth: Payer: Self-pay | Admitting: Internal Medicine

## 2013-04-03 NOTE — Telephone Encounter (Signed)
pt called to r/s 1/19 appt to 1/15. pt has new d/t and per pt request schedule mailed.

## 2013-04-09 ENCOUNTER — Ambulatory Visit (HOSPITAL_BASED_OUTPATIENT_CLINIC_OR_DEPARTMENT_OTHER): Payer: Medicaid Other | Admitting: Internal Medicine

## 2013-04-09 ENCOUNTER — Other Ambulatory Visit (HOSPITAL_BASED_OUTPATIENT_CLINIC_OR_DEPARTMENT_OTHER): Payer: Medicaid Other

## 2013-04-09 ENCOUNTER — Telehealth: Payer: Self-pay | Admitting: Internal Medicine

## 2013-04-09 VITALS — BP 124/72 | HR 79 | Temp 98.7°F | Resp 20 | Ht 64.0 in | Wt 264.3 lb

## 2013-04-09 DIAGNOSIS — D649 Anemia, unspecified: Secondary | ICD-10-CM

## 2013-04-09 DIAGNOSIS — D509 Iron deficiency anemia, unspecified: Secondary | ICD-10-CM

## 2013-04-09 DIAGNOSIS — Z720 Tobacco use: Secondary | ICD-10-CM

## 2013-04-09 DIAGNOSIS — D72829 Elevated white blood cell count, unspecified: Secondary | ICD-10-CM

## 2013-04-09 DIAGNOSIS — F17200 Nicotine dependence, unspecified, uncomplicated: Secondary | ICD-10-CM

## 2013-04-09 LAB — CBC WITH DIFFERENTIAL/PLATELET
BASO%: 0.4 % (ref 0.0–2.0)
BASOS ABS: 0 10*3/uL (ref 0.0–0.1)
EOS%: 1.4 % (ref 0.0–7.0)
Eosinophils Absolute: 0.1 10*3/uL (ref 0.0–0.5)
HEMATOCRIT: 35.9 % (ref 34.8–46.6)
HEMOGLOBIN: 11.7 g/dL (ref 11.6–15.9)
LYMPH%: 38.8 % (ref 14.0–49.7)
MCH: 24.3 pg — AB (ref 25.1–34.0)
MCHC: 32.6 g/dL (ref 31.5–36.0)
MCV: 74.6 fL — ABNORMAL LOW (ref 79.5–101.0)
MONO#: 0.8 10*3/uL (ref 0.1–0.9)
MONO%: 8.3 % (ref 0.0–14.0)
NEUT#: 4.8 10*3/uL (ref 1.5–6.5)
NEUT%: 51.1 % (ref 38.4–76.8)
PLATELETS: 285 10*3/uL (ref 145–400)
RBC: 4.81 10*6/uL (ref 3.70–5.45)
RDW: 18.8 % — ABNORMAL HIGH (ref 11.2–14.5)
WBC: 9.4 10*3/uL (ref 3.9–10.3)
lymph#: 3.6 10*3/uL — ABNORMAL HIGH (ref 0.9–3.3)

## 2013-04-09 MED ORDER — FERROUS SULFATE 325 (65 FE) MG PO TBEC: 325.0000 mg | DELAYED_RELEASE_TABLET | Freq: Every day | ORAL | Status: DC

## 2013-04-09 NOTE — Telephone Encounter (Signed)
gv adn printed appt sched and avs for pt for April and July

## 2013-04-09 NOTE — Patient Instructions (Signed)
Iron tablets, capsules, extended-release tablets What is this medicine? IRON (AHY ern) replaces iron that is essential to healthy red blood cells. Iron is used to treat iron deficiency anemia. Anemia may cause problems like tiredness, shortness of breath, or slowed growth in children. Only take iron if your doctor has told you to. Do not treat yourself with iron if you are feeling tired. Most healthy people get enough iron in their diets, particularly if they eat cereals, meat, poultry, and fish. This medicine may be used for other purposes; ask your health care provider or pharmacist if you have questions. COMMON BRAND NAME(S): Bifera, Duofer, Feosol Complete, [employer], [place], [place], [place] , [place], Fero-Grad 500, Ferretts, Ferrimin , Ferro-Sequels, Hemocyte, Nephro-Fer, NovaFerrum, ProFe, Slow Fe, Slow Iron , Tandem What should I tell my health care provider before I take this medicine? They need to know if you have any of these conditions: -frequently drink alcohol -bowel disease -hemolytic anemia -iron overload (hemochromatosis, hemosiderosis) -liver disease -problems with swallowing -stomach ulcer or other stomach problems -an unusual or allergic reaction to iron, other medicines, foods, dyes, or preservatives -pregnant or trying to get pregnant -breast-feeding How should I use this medicine? Take this medicine by mouth with a glass of water or fruit juice. Follow the directions on the prescription label. Swallow whole. Do not crush or chew. Take this medicine in an upright or sitting position. Try to take any bedtime doses at least 10 minutes before lying down. You may take this medicine with food. Take your medicine at regular intervals. Do not take your medicine more often than directed. Do not stop taking except on your doctor's advice. Talk to your pediatrician regarding the use of this medicine in children. While this drug may be prescribed for selected conditions,  precautions do apply. Overdosage: If you think you have taken too much of this medicine contact a poison control center or emergency room at once. NOTE: This medicine is only for you. Do not share this medicine with others. What if I miss a dose? If you miss a dose, take it as soon as you can. If it is almost time for your next dose, take only that dose. Do not take double or extra doses. What may interact with this medicine? If you are taking this iron product, you should not take iron in any other medicine or dietary supplement. This medicine may also interact with the following medications: -alendronate -antacids -cefdinir -chloramphenicol -cholestyramine -deferoxamine -dimercaprol -etidronate -medicines for stomach ulcers or other stomach problems -pancreatic enzymes -quinolone antibiotics (examples: Cipro, Floxin, Levaquin, Tequin and others) -risedronate -tetracycline antibiotics (examples: doxycycline, tetracycline, minocycline, and others) -thyroid hormones This list may not describe all possible interactions. Give your health care provider a list of all the medicines, herbs, non-prescription drugs, or dietary supplements you use. Also tell them if you smoke, drink alcohol, or use illegal drugs. Some items may interact with your medicine. What should I watch for while using this medicine? Use iron supplements only as directed by your health care professional. Dennis Bast will need important blood work while you are taking this medicine. It may take 3 to 6 months of therapy to treat low iron levels. Pregnant women should follow the dose and length of iron treatment as directed by their doctors. Do not use iron longer than prescribed, and do not take a higher dose than recommended. Long-term use may cause excess iron to build-up in the body. Do not take iron with antacids. If you need  to take an antacid, take it 2 hours after a dose of iron. What side effects may I notice from receiving this  medicine? Side effects that you should report to your doctor or health care professional as soon as possible: -allergic reactions like skin rash, itching or hives, swelling of the face, lips, or tongue -blue lips, nails, or palms -dark colored stools (this may be due to the iron, but can indicate a more serious condition) -drowsiness -pain with or difficulty swallowing -pale or clammy skin -seizures -stomach pain -unusually weak or tired -vomiting -weak, fast, or irregular heartbeat Side effects that usually do not require medical attention (report to your doctor or health care professional if they continue or are bothersome): -constipation -indigestion -nausea or stomach upset This list may not describe all possible side effects. Call your doctor for medical advice about side effects. You may report side effects to FDA at 1-800-FDA-1088. Where should I keep my medicine? Keep out of the reach of children. Even small amounts of iron can be harmful to a child. Store at room temperature between 15 and 30 degrees C (59 and 86 degrees F). Keep container tightly closed. Throw away any unused medicine after the expiration date. NOTE: This sheet is a summary. It may not cover all possible information. If you have questions about this medicine, talk to your doctor, pharmacist, or health care provider.  2014, Elsevier/Gold Standard. (2007-07-29 17:03:41)

## 2013-04-11 ENCOUNTER — Encounter: Payer: Self-pay | Admitting: Internal Medicine

## 2013-04-11 NOTE — Progress Notes (Signed)
[facility] OFFICE PROGRESS NOTE  Candace Chessman, MD [place] [address]  DIAGNOSIS: Leukocytosis, unspecified  Anemia, unspecified - Plan: ferrous sulfate 325 (65 FE) MG EC tablet, CBC with Differential, Ferritin, Iron and TIBC, CBC with Differential, Comprehensive metabolic panel (Cmet) - [facility], Ferritin, Iron and TIBC  Tobacco abuse  Chief Complaint  Patient presents with  . Leukocytosis    CURRENT THERAPY: Ferrous sulfate 325 mg q breakfast with orange juice.   INTERVAL HISTORY: [name] 28 y.o. female who is morbidly obese with a hit wrist a history of neck surgery status post C6/C7 fusion percent for followup for leukocytosis.   She was last seen by me on 12/31/2012.  She was last seen by her primary care physician on 12/01/2012. She reports long-standing chronic pain syndrome consisting of neck and right arm pain which has been ongoing for the past several months. As a result an MRI of cervical spine was done revealing a small broad-based soft disc protrusion at C5/C6 and C6/C7 slightly asymmetric to the right with these protrusions extending into the lateral recesses and could affect the C6 and C7 nerves bilaterally. As a result she underwent C6/C7 fusion neck surgery on 12/10/2012. She reports no complications to the surgery.   During her last visit with her primary care physician, she was told about an elevated white blood cell count of 12.2; hemoglobin of 11.5 and hematocrit of 33.9 with normal platelets of 286. She denied any history of elevated white counts. Though she does report taking steroids for her back problems about one month and a half ago. She is a positive smoker with less than one half a pack per day since the age of 28 years old. She reported gaining over 30 pounds over the last 6 months. She denied any fever chills or shortness of breath. She also endorsed taking depot shot every 3 months with her last one being on  12/31/2012.  She will stop depot shot because of concerns for causing her to increase her weight. Her weight today is 264 lbs.    She has 3 children one of her pregnancies resulted in a premature birth for unclear reason. She also suffers from occasional migraine headaches. He reports having a history of an abnormal Pap smear resulted in multiple cervical LEEP procedures and colposcopies.  She denies any hemoptysis, hematochezia, easy bruising, fever, night sweats, repeated infections, or weight loss. She denies picca.     MEDICAL HISTORY: Past Medical History  Diagnosis Date  . Scoliosis   . Obesity   . Migraine   . Anxiety   . Gallstones   . Asthma   . Arthritis   . Pelvic inflammatory disease (PID) 05-08-11    previous hx. .Hx. childbirth x3-NVD  . Leukocytosis     going to hematologist  . History of renal failure   . Edema   . History of anemia     INTERIM HISTORY: has Obesity; Migraine; Asthma; Chronic pain syndrome; Neck pain; Back pain; Osteoarthritis; Numbness of arm; Tobacco abuse; Leukocytosis, unspecified; and Anemia, unspecified on her problem list.    ALLERGIES:  is allergic to banana and latex.  MEDICATIONS: has a current medication list which includes the following prescription(s): albuterol, alprazolam, aripiprazole, calcium-vitamin d, cyclobenzaprine, fluoxetine, fluticasone, gabapentin, medroxyprogesterone, methocarbamol, multiple vitamins-minerals, omeprazole, trazodone, ferrous sulfate, and zolpidem.  SURGICAL HISTORY:  Past Surgical History  Procedure Laterality Date  . Abdominal exploration surgery  approx 28 years old  rlq(due to adhesions around ovaries)-appendix and ovaries remain  . Childbirth    . Cholecystectomy  05/10/2011    Procedure: LAPAROSCOPIC CHOLECYSTECTOMY WITH INTRAOPERATIVE CHOLANGIOGRAM;  Surgeon: Gayland Curry, MD,FACS;  Location: [facility];  Service: General;  Laterality: N/A;  . Anterior cervical decomp/discectomy fusion N/A 12/09/2012     Procedure: ANTERIOR CERVICAL DECOMPRESSION/DISCECTOMY FUSION STRUCTURAL ALLOGRAFT TRESTLE PLATE CERVICAL FIVE-SIX,SIX-SEVEN;  Surgeon: Otilio Connors, MD;  Location: [facility];  Service: Neurosurgery;  Laterality: N/A;    REVIEW OF SYSTEMS:   Constitutional: Denies fevers, chills or abnormal weight loss Eyes: Denies blurriness of vision Ears, nose, mouth, throat, and face: Denies mucositis or sore throat Respiratory: Denies cough, dyspnea or wheezes Cardiovascular: Denies palpitation, chest discomfort or lower extremity swelling Gastrointestinal:  Denies nausea, heartburn or change in bowel habits Skin: Denies abnormal skin rashes Lymphatics: Denies new lymphadenopathy or easy bruising Neurological:Denies numbness, tingling or new weaknesses Behavioral/Psych: Mood is stable, no new changes  All other systems were reviewed with the patient and are negative.  PHYSICAL EXAMINATION: ECOG PERFORMANCE STATUS: 0 - Asymptomatic  Blood pressure 124/72, pulse 79, temperature 98.7 F (37.1 C), temperature source Oral, resp. rate 20, height 5\' 4"  (1.626 m), weight 264 lb 4.8 oz (119.886 kg).  GENERAL:alert, no distress and comfortable; well developed and well nourished and obese.  SKIN: skin color, texture, turgor are normal, no rashes or significant lesions EYES: normal, Conjunctiva are pink and non-injected, sclera clear OROPHARYNX:no exudate, no erythema and lips, buccal mucosa, and tongue normal  NECK: supple, thyroid normal size, non-tender, without nodularity LYMPH:  no palpable lymphadenopathy in the cervical, axillary or supraclavicular LUNGS: clear to auscultation and percussion with normal breathing effort HEART: regular rate & rhythm and no murmurs and no lower extremity edema ABDOMEN:abdomen soft, non-tender and normal bowel sounds Musculoskeletal:no cyanosis of digits and no clubbing  NEURO: alert & oriented x 3 with fluent speech, no focal motor/sensory deficits  LABORATORY  DATA: Results for orders placed in visit on 04/09/13 (from the past 48 hour(s))  CBC WITH DIFFERENTIAL     Status: Abnormal   Collection Time    04/09/13  1:11 PM      Result Value Range   WBC 9.4  3.9 - 10.3 10e3/uL   NEUT# 4.8  1.5 - 6.5 10e3/uL   HGB 11.7  11.6 - 15.9 g/dL   HCT 35.9  34.8 - 46.6 %   Platelets 285  145 - 400 10e3/uL   MCV 74.6 (*) 79.5 - 101.0 fL   MCH 24.3 (*) 25.1 - 34.0 pg   MCHC 32.6  31.5 - 36.0 g/dL   RBC 4.81  3.70 - 5.45 10e6/uL   RDW 18.8 (*) 11.2 - 14.5 %   lymph# 3.6 (*) 0.9 - 3.3 10e3/uL   MONO# 0.8  0.1 - 0.9 10e3/uL   Eosinophils Absolute 0.1  0.0 - 0.5 10e3/uL   Basophils Absolute 0.0  0.0 - 0.1 10e3/uL   NEUT% 51.1  38.4 - 76.8 %   LYMPH% 38.8  14.0 - 49.7 %   MONO% 8.3  0.0 - 14.0 %   EOS% 1.4  0.0 - 7.0 %   BASO% 0.4  0.0 - 2.0 %   Labs:  Lab Results  Component Value Date   WBC 9.4 04/09/2013   HGB 11.7 04/09/2013   HCT 35.9 04/09/2013   MCV 74.6* 04/09/2013   PLT 285 04/09/2013   NEUTROABS 4.8 04/09/2013      Chemistry  Component Value Date/Time   NA 136 12/09/2012 0929   K 4.5 12/09/2012 0929   CL 103 12/09/2012 0929   CO2 21 12/09/2012 0929   BUN 17 12/09/2012 0929   CREATININE 0.81 12/09/2012 0929   CREATININE 0.77 10/28/2012 1809      Component Value Date/Time   CALCIUM 9.0 12/09/2012 0929   ALKPHOS 63 10/28/2012 1809   AST 17 10/28/2012 1809   ALT 18 10/28/2012 1809   BILITOT 0.2* 10/28/2012 1809     CBC:  Recent Labs Lab 04/09/13 1311  WBC 9.4  NEUTROABS 4.8  HGB 11.7  HCT 35.9  MCV 74.6*  PLT 285   Iron/TIBC/Ferritin    Component Value Date/Time   IRON 20* 12/31/2012 1210   TIBC 351 12/31/2012 1210   FERRITIN 27 12/31/2012 1210   Studies:  No results found.   RADIOGRAPHIC STUDIES: No results found.  ASSESSMENT: [name] 28 y.o. female with a history of Leukocytosis, unspecified  Anemia, unspecified - Plan: ferrous sulfate 325 (65 FE) MG EC tablet, CBC with Differential, Ferritin, Iron and TIBC, CBC with  Differential, Comprehensive metabolic panel (Cmet) - [facility], Ferritin, Iron and TIBC  Tobacco abuse   PLAN:   1. Leukocytosis not otherwise specified, resolved.  --On last visit, we examined her CBC over the past one year reveals a normal white blood cell count one month ago on 10/28/2012 at 8.9; also one year ago on 05/10/2011 her white blood cell count was 9. Leukocytosis could clearly be related to her smoking versus postoperative inflammation versus other stressors and/or medicines. She denied using any steroids recently. We repeated her CBC last visit and it revealed a white blood cell count of 12.9.Today, her WBC is within normal limits at 9.6.   We recommended the patient stop smoking and we will repeat her labs in one month. Last vis, she was also provided a handout on leukocytosis.   2. Microcytic Anemia.  --She may have a mild iron deficiency based on a lower ferritin of 20 and a high RDW, low reticulocyte count. However, she denies any menstrual periods while being on the Depo shot. We will repeat her CBC and iron studies in one month. Given these results, we are starting ferrous sulfate q breakfast.  She will also be off depot which will cause additional iron lost with menstruation. Marland Kitchen She was counseled extensively on symptoms of anemia including and not limited to palpitations, dyspnea on exertion, shortness of breath and worsening fatigue. She was also provided a handout on symptoms of anemia.   3. Tobacco Abuse.  --Patient was also advised to stop smoking as this may contribute in part to #1. She reports decreasing from one pack per day to 1/3 of a pack per day. She did not desire tobacco counseling referral presently, but she will call if she changes her mind.   All questions were answered. The patient knows to call the clinic with any problems, questions or concerns. We can certainly see the patient much sooner if necessary. She was also provided and after visit summary.  All  questions were answered. The patient knows to call the clinic with any problems, questions or concerns. We can certainly see the patient much sooner if necessary.  I spent 10 minutes counseling the patient face to face. The total time spent in the appointment was 15 minutes.    Cailean Heacock, MD 04/11/2013 9:04 AM

## 2013-04-13 ENCOUNTER — Ambulatory Visit: Payer: Medicaid Other

## 2013-04-13 ENCOUNTER — Other Ambulatory Visit: Payer: Medicaid Other

## 2013-07-02 ENCOUNTER — Other Ambulatory Visit (HOSPITAL_BASED_OUTPATIENT_CLINIC_OR_DEPARTMENT_OTHER): Payer: Medicaid Other

## 2013-07-02 DIAGNOSIS — D509 Iron deficiency anemia, unspecified: Secondary | ICD-10-CM

## 2013-07-02 DIAGNOSIS — D649 Anemia, unspecified: Secondary | ICD-10-CM

## 2013-07-02 LAB — CBC WITH DIFFERENTIAL/PLATELET
BASO%: 0.1 % (ref 0.0–2.0)
Basophils Absolute: 0 10*3/uL (ref 0.0–0.1)
EOS%: 1 % (ref 0.0–7.0)
Eosinophils Absolute: 0.1 10*3/uL (ref 0.0–0.5)
HEMATOCRIT: 35.9 % (ref 34.8–46.6)
HGB: 11.6 g/dL (ref 11.6–15.9)
LYMPH#: 3.7 10*3/uL — AB (ref 0.9–3.3)
LYMPH%: 32.4 % (ref 14.0–49.7)
MCH: 25.7 pg (ref 25.1–34.0)
MCHC: 32.3 g/dL (ref 31.5–36.0)
MCV: 79.4 fL — AB (ref 79.5–101.0)
MONO#: 0.8 10*3/uL (ref 0.1–0.9)
MONO%: 6.8 % (ref 0.0–14.0)
NEUT#: 6.9 10*3/uL — ABNORMAL HIGH (ref 1.5–6.5)
NEUT%: 59.7 % (ref 38.4–76.8)
Platelets: 307 10*3/uL (ref 145–400)
RBC: 4.52 10*6/uL (ref 3.70–5.45)
RDW: 18.3 % — ABNORMAL HIGH (ref 11.2–14.5)
WBC: 11.5 10*3/uL — ABNORMAL HIGH (ref 3.9–10.3)

## 2013-07-02 LAB — IRON AND TIBC CHCC
%SAT: 6 % — ABNORMAL LOW (ref 21–57)
Iron: 23 ug/dL — ABNORMAL LOW (ref 41–142)
TIBC: 369 ug/dL (ref 236–444)
UIBC: 346 ug/dL (ref 120–384)

## 2013-07-02 LAB — FERRITIN CHCC: Ferritin: 22 ng/ml (ref 9–269)

## 2013-07-03 ENCOUNTER — Encounter (HOSPITAL_COMMUNITY): Payer: Self-pay | Admitting: Emergency Medicine

## 2013-07-03 ENCOUNTER — Emergency Department (HOSPITAL_COMMUNITY)
Admission: EM | Admit: 2013-07-03 | Discharge: 2013-07-03 | Discharge status: Against Medical Advice | Payer: Medicaid Other | Attending: Emergency Medicine | Admitting: Emergency Medicine

## 2013-07-03 DIAGNOSIS — J45909 Unspecified asthma, uncomplicated: Secondary | ICD-10-CM | POA: Insufficient documentation

## 2013-07-03 DIAGNOSIS — F17200 Nicotine dependence, unspecified, uncomplicated: Secondary | ICD-10-CM | POA: Insufficient documentation

## 2013-07-03 DIAGNOSIS — E669 Obesity, unspecified: Secondary | ICD-10-CM | POA: Insufficient documentation

## 2013-07-03 DIAGNOSIS — K219 Gastro-esophageal reflux disease without esophagitis: Secondary | ICD-10-CM | POA: Insufficient documentation

## 2013-07-03 DIAGNOSIS — M25559 Pain in unspecified hip: Secondary | ICD-10-CM | POA: Insufficient documentation

## 2013-07-03 NOTE — ED Notes (Signed)
Pt decided to go to PEDS with child. Will be seen on adult side later

## 2013-07-03 NOTE — ED Notes (Signed)
Pt states hip pain with history of sciatic pain.  Pt states also bad case of heartburn that started over thirty days.

## 2013-07-03 NOTE — ED Notes (Signed)
Pt came to desk and stated that she could not wait any longer and was ready to go home.

## 2013-08-04 ENCOUNTER — Other Ambulatory Visit: Payer: Self-pay | Admitting: Internal Medicine

## 2013-08-04 MED ORDER — FERROUS SULFATE 325 (65 FE) MG PO TBEC: 325.0000 mg | DELAYED_RELEASE_TABLET | Freq: Two times a day (BID) | ORAL | Status: DC

## 2013-09-24 ENCOUNTER — Ambulatory Visit: Payer: Medicaid Other

## 2013-09-24 ENCOUNTER — Other Ambulatory Visit: Payer: Medicaid Other

## 2013-11-16 ENCOUNTER — Encounter (HOSPITAL_COMMUNITY): Payer: Self-pay | Admitting: Emergency Medicine

## 2013-11-16 ENCOUNTER — Emergency Department (HOSPITAL_COMMUNITY)
Admission: EM | Admit: 2013-11-16 | Discharge: 2013-11-16 | Disposition: A | Discharge status: Home / Self Care | Payer: Medicaid Other | Attending: Emergency Medicine | Admitting: Emergency Medicine

## 2013-11-16 DIAGNOSIS — Z862 Personal history of diseases of the blood and blood-forming organs and certain disorders involving the immune mechanism: Secondary | ICD-10-CM | POA: Insufficient documentation

## 2013-11-16 DIAGNOSIS — M5430 Sciatica, unspecified side: Secondary | ICD-10-CM | POA: Insufficient documentation

## 2013-11-16 DIAGNOSIS — R209 Unspecified disturbances of skin sensation: Secondary | ICD-10-CM | POA: Diagnosis not present

## 2013-11-16 DIAGNOSIS — Z79899 Other long term (current) drug therapy: Secondary | ICD-10-CM | POA: Insufficient documentation

## 2013-11-16 DIAGNOSIS — Z9889 Other specified postprocedural states: Secondary | ICD-10-CM | POA: Insufficient documentation

## 2013-11-16 DIAGNOSIS — M25559 Pain in unspecified hip: Secondary | ICD-10-CM | POA: Diagnosis present

## 2013-11-16 DIAGNOSIS — J45909 Unspecified asthma, uncomplicated: Secondary | ICD-10-CM | POA: Insufficient documentation

## 2013-11-16 MED ORDER — HYDROCODONE-ACETAMINOPHEN 5-325 MG PO TABS: ORAL_TABLET | ORAL | Status: DC

## 2013-11-16 MED ORDER — PREDNISONE 20 MG PO TABS: ORAL_TABLET | ORAL | Status: DC

## 2013-11-16 MED ORDER — CYCLOBENZAPRINE HCL 5 MG PO TABS: 5.0000 mg | ORAL_TABLET | Freq: Three times a day (TID) | ORAL | Status: DC | PRN

## 2013-11-16 MED ORDER — KETOROLAC TROMETHAMINE 60 MG/2ML IM SOLN
60.0000 mg | Freq: Once | INTRAMUSCULAR | Status: AC
  Administered 2013-11-16: 60 mg via INTRAMUSCULAR
  Filled 2013-11-16: qty 2

## 2013-11-16 NOTE — ED Notes (Signed)
Pt c/o left hip pain x 2 weeks worse today; pt denies obvious injury

## 2013-11-16 NOTE — ED Provider Notes (Signed)
Chief Complaint   Chief Complaint  Patient presents with  . Hip Pain    History of Present Illness   [name] is a  27 year old female who's had a 2 week history of pain in her lower back radiating into both hips and to the right knee. There is numbness, tingling, and burning in the right leg. She also has a painful area on her left medial thigh. She states she can feel a knot in that area. She denies any injury to her lower back, muscle weakness, bladder or bowel dysfunction, or saddle anesthesia. She's had no abdominal pain, nausea, or vomiting. No fever, chills, or weight loss. The pain is worse when she walks.  Review of Systems   Other than as noted above, the patient denies any of the following symptoms: Systemic:  No fever, chills, or unexplained weight loss. GI:  No abdominal painor incontinence of bowel. GU:  No dysuria, frequency, urgency, or hematuria. No incontinence of urine or urinary retention.  M-S:  No neck pain or arthritis. Neuro:  No paresthesias, headache, saddle anesthesia, muscular weakness, or progressive neurological deficit.  [place]   Past medical history, family history, social history, meds, and allergies were reviewed. Specifically, there is no history of cancer, major trauma, osteoporosis, immunosuppression, or HIV infection. She's had neck surgery for a degenerative disc. She also has asthma and leukocytosis. She takes Prozac, Abilify, Ambien, and Neurontin.  Physical Examination    Vital signs:  BP 134/65  Pulse 78  Temp(Src) 97.7 F (36.5 C) (Oral)  Resp 16  SpO2 99% General:  Alert, oriented, in no distress. Abdomen:  Soft, non-tender.  No organomegaly or mass.  No pulsatile midline abdominal mass or bruit. Back:  Examine the back reveals moderate tenderness to palpation in the entire lower back, most markedly over the sacroiliac joints, but on down to the buttocks and the proximal thighs. Her back has limited range of motion with pain.  Straight leg raising was positive bilaterally with pain in the lower back and public areas. Neuro:  Normal muscle strength, sensations and DTRs. Extremities: Pedal pulses were full, there was no edema. Skin:  Clear, warm and dry.  No rash.  Course in [facility]   She was given ketorolac 6 mg IM.    Assessment   The encounter diagnosis was Sciatica, unspecified laterality.  No evidence of cauda equina syndrome, discitis, epidural abscess, or aneurism.    Plan     1.  Meds:  The following meds were prescribed:   Discharge Medication List as of 11/16/2013 10:52 AM    START taking these medications   Details  cyclobenzaprine (FLEXERIL) 5 MG tablet Take 1 tablet (5 mg total) by mouth 3 (three) times daily as needed for muscle spasms., Starting 11/16/2013, Until Discontinued, Print    HYDROcodone-acetaminophen (NORCO/VICODIN) 5-325 MG per tablet 1 to 2 tabs every 4 to 6 hours as needed for pain., Print    predniSONE (DELTASONE) 20 MG tablet Take 3 daily for 5 days, 2 daily for 5 days, 1 daily for 5 days., Print        2.  Patient Education/Counseling:  The patient was given appropriate handouts, self care instructions, and instructed in symptomatic relief. The patient was encouraged to try to be as active as possible and given some exercises to do followed by moist heat.  3.  Follow up:  The patient was told to follow up here if no better in 3 to 4 days, or  sooner if becoming worse in any way, and given some red flag symptoms such as worsening pain or new neurological symptoms which would prompt immediate return.  Follow up with Dr. Kristeen Miss as soon as possible.     Harden Mo, MD 11/16/13 [phone]

## 2013-11-16 NOTE — Discharge Instructions (Signed)
Do exercises twice daily followed by moist heat for 15 minutes. ° ° ° ° ° °Try to be as active as possible. ° °If no better in 2 weeks, follow up with orthopedist. ° ° °

## 2013-11-23 ENCOUNTER — Emergency Department (HOSPITAL_COMMUNITY)
Admission: EM | Admit: 2013-11-23 | Discharge: 2013-11-24 | Disposition: A | Discharge status: Home / Self Care | Payer: Medicaid Other | Attending: Emergency Medicine | Admitting: Emergency Medicine

## 2013-11-23 ENCOUNTER — Encounter (HOSPITAL_COMMUNITY): Payer: Self-pay | Admitting: Emergency Medicine

## 2013-11-23 DIAGNOSIS — Z79899 Other long term (current) drug therapy: Secondary | ICD-10-CM | POA: Insufficient documentation

## 2013-11-23 DIAGNOSIS — M7989 Other specified soft tissue disorders: Secondary | ICD-10-CM | POA: Diagnosis not present

## 2013-11-23 DIAGNOSIS — M545 Low back pain, unspecified: Secondary | ICD-10-CM | POA: Diagnosis present

## 2013-11-23 DIAGNOSIS — M129 Arthropathy, unspecified: Secondary | ICD-10-CM | POA: Insufficient documentation

## 2013-11-23 DIAGNOSIS — F17200 Nicotine dependence, unspecified, uncomplicated: Secondary | ICD-10-CM | POA: Diagnosis not present

## 2013-11-23 DIAGNOSIS — D649 Anemia, unspecified: Secondary | ICD-10-CM | POA: Insufficient documentation

## 2013-11-23 DIAGNOSIS — Z8659 Personal history of other mental and behavioral disorders: Secondary | ICD-10-CM | POA: Diagnosis not present

## 2013-11-23 DIAGNOSIS — J45909 Unspecified asthma, uncomplicated: Secondary | ICD-10-CM | POA: Diagnosis not present

## 2013-11-23 DIAGNOSIS — E669 Obesity, unspecified: Secondary | ICD-10-CM | POA: Insufficient documentation

## 2013-11-23 DIAGNOSIS — Z8719 Personal history of other diseases of the digestive system: Secondary | ICD-10-CM | POA: Diagnosis not present

## 2013-11-23 DIAGNOSIS — Z8742 Personal history of other diseases of the female genital tract: Secondary | ICD-10-CM | POA: Diagnosis not present

## 2013-11-23 DIAGNOSIS — M5432 Sciatica, left side: Secondary | ICD-10-CM

## 2013-11-23 DIAGNOSIS — M5430 Sciatica, unspecified side: Secondary | ICD-10-CM | POA: Insufficient documentation

## 2013-11-23 DIAGNOSIS — IMO0002 Reserved for concepts with insufficient information to code with codable children: Secondary | ICD-10-CM | POA: Diagnosis not present

## 2013-11-23 DIAGNOSIS — Z91040 Latex allergy status: Secondary | ICD-10-CM | POA: Insufficient documentation

## 2013-11-23 DIAGNOSIS — G43909 Migraine, unspecified, not intractable, without status migrainosus: Secondary | ICD-10-CM | POA: Diagnosis not present

## 2013-11-23 MED ORDER — ONDANSETRON 4 MG PO TBDP
8.0000 mg | ORAL_TABLET | Freq: Once | ORAL | Status: AC
  Administered 2013-11-23: 8 mg via ORAL
  Filled 2013-11-23: qty 2

## 2013-11-23 MED ORDER — OXYCODONE-ACETAMINOPHEN 5-325 MG PO TABS: 2.0000 | ORAL_TABLET | ORAL | Status: DC | PRN

## 2013-11-23 MED ORDER — HYDROMORPHONE HCL PF 1 MG/ML IJ SOLN
2.0000 mg | Freq: Once | INTRAMUSCULAR | Status: AC
Start: 2013-11-23 — End: 2013-11-23
  Administered 2013-11-23: 2 mg via INTRAMUSCULAR
  Filled 2013-11-23: qty 2

## 2013-11-23 NOTE — Discharge Instructions (Signed)
Sciatica Sciatica is pain, weakness, numbness, or tingling along the path of the sciatic nerve. The nerve starts in the lower back and runs down the back of each leg. The nerve controls the muscles in the lower leg and in the back of the knee, while also providing sensation to the back of the thigh, lower leg, and the sole of your foot. Sciatica is a symptom of another medical condition. For instance, nerve damage or certain conditions, such as a herniated disk or bone spur on the spine, pinch or put pressure on the sciatic nerve. This causes the pain, weakness, or other sensations normally associated with sciatica. Generally, sciatica only affects one side of the body. CAUSES   Herniated or slipped disc.  Degenerative disk disease.  A pain disorder involving the narrow muscle in the buttocks (piriformis syndrome).  Pelvic injury or fracture.  Pregnancy.  Tumor (rare). SYMPTOMS  Symptoms can vary from mild to very severe. The symptoms usually travel from the low back to the buttocks and down the back of the leg. Symptoms can include:  Mild tingling or dull aches in the lower back, leg, or hip.  Numbness in the back of the calf or sole of the foot.  Burning sensations in the lower back, leg, or hip.  Sharp pains in the lower back, leg, or hip.  Leg weakness.  Severe back pain inhibiting movement. These symptoms may get worse with coughing, sneezing, laughing, or prolonged sitting or standing. Also, being overweight may worsen symptoms. DIAGNOSIS  Your caregiver will perform a physical exam to look for common symptoms of sciatica. He or she may ask you to do certain movements or activities that would trigger sciatic nerve pain. Other tests may be performed to find the cause of the sciatica. These may include:  Blood tests.  X-rays.  Imaging tests, such as an MRI or CT scan. TREATMENT  Treatment is directed at the cause of the sciatic pain. Sometimes, treatment is not necessary  and the pain and discomfort goes away on its own. If treatment is needed, your caregiver may suggest:  Over-the-counter medicines to relieve pain.  Prescription medicines, such as anti-inflammatory medicine, muscle relaxants, or narcotics.  Applying heat or ice to the painful area.  Steroid injections to lessen pain, irritation, and inflammation around the nerve.  Reducing activity during periods of pain.  Exercising and stretching to strengthen your abdomen and improve flexibility of your spine. Your caregiver may suggest losing weight if the extra weight makes the back pain worse.  Physical therapy.  Surgery to eliminate what is pressing or pinching the nerve, such as a bone spur or part of a herniated disk. HOME CARE INSTRUCTIONS   Only take over-the-counter or prescription medicines for pain or discomfort as directed by your caregiver.  Apply ice to the affected area for 20 minutes, 3-4 times a day for the first 48-72 hours. Then try heat in the same way.  Exercise, stretch, or perform your usual activities if these do not aggravate your pain.  Attend physical therapy sessions as directed by your caregiver.  Keep all follow-up appointments as directed by your caregiver.  Do not wear high heels or shoes that do not provide proper support.  Check your mattress to see if it is too soft. A firm mattress may lessen your pain and discomfort. SEEK IMMEDIATE MEDICAL CARE IF:   You lose control of your bowel or bladder (incontinence).  You have increasing weakness in the lower back, pelvis, buttocks,   or legs.  You have redness or swelling of your back.  You have a burning sensation when you urinate.  You have pain that gets worse when you lie down or awakens you at night.  Your pain is worse than you have experienced in the past.  Your pain is lasting longer than 4 weeks.  You are suddenly losing weight without reason. MAKE SURE YOU:  Understand these  instructions.  Will watch your condition.  Will get help right away if you are not doing well or get worse. Document Released: 03/06/2001 Document Revised: 09/11/2011 Document Reviewed: 07/22/2011 ExitCare Patient Information 2015 [employer]. This information is not intended to replace advice given to you by your health care provider. Make sure you discuss any questions you have with your health care provider.  

## 2013-11-23 NOTE — ED Notes (Signed)
Pt placed into gown and placed on monitor upon arrival to room. Pt monitored by blood pressure and pulse ox.

## 2013-11-23 NOTE — ED Provider Notes (Signed)
CSN: [id number]     Arrival date & time 11/23/13  2053 History   First MD Initiated Contact with Patient 11/23/13 2149     Chief Complaint  Patient presents with  . Back Pain  . Leg Swelling     (Consider location/radiation/quality/duration/timing/severity/associated sxs/prior Treatment) HPI Comments: Patient presents to the ER for evaluation of low back pain. Patient was seen one week ago in urgent care with similar complaints. The patient was initiated on prednisone, Vicodin, and Flexeril. Patient reports that she has not can't be controlled his medications. Vicodin is making her itch. She reports that the pain is worse. She reports a sharp, stabbing, constant pain in the left low back area that radiates down her buttocks when she moves. No change in bowel or bladder function. Possible stricture sensation in the lower extremities. Since starting the medication she does notice some swelling in her feet.  Patient is a 28 y.o. female presenting with back pain.  Back Pain   Past Medical History  Diagnosis Date  . Scoliosis   . Obesity   . Migraine   . Anxiety   . Gallstones   . Asthma   . Arthritis   . Pelvic inflammatory disease (PID) 05-08-11    previous hx. .Hx. childbirth x3-NVD  . Leukocytosis     going to hematologist  . History of renal failure   . Edema   . History of anemia    Past Surgical History  Procedure Laterality Date  . Abdominal exploration surgery  approx 28 years old    rlq(due to adhesions around ovaries)-appendix and ovaries remain  . Childbirth    . Cholecystectomy  05/10/2011    Procedure: LAPAROSCOPIC CHOLECYSTECTOMY WITH INTRAOPERATIVE CHOLANGIOGRAM;  Surgeon: Gayland Curry, MD,FACS;  Location: [facility];  Service: General;  Laterality: N/A;  . Anterior cervical decomp/discectomy fusion N/A 12/09/2012    Procedure: ANTERIOR CERVICAL DECOMPRESSION/DISCECTOMY FUSION STRUCTURAL ALLOGRAFT TRESTLE PLATE CERVICAL FIVE-SIX,SIX-SEVEN;  Surgeon: Otilio Connors, MD;   Location: [place] [facility];  Service: Neurosurgery;  Laterality: N/A;   Family History  Problem Relation Age of Onset  . Prostate cancer Maternal Grandfather   . Cancer Maternal Grandfather     prostate  . Colon polyps Maternal Grandmother   . Diabetes Mother   . Endometriosis Mother   . Cancer Mother     lung  . Diabetes Father   . Heart disease Father   . Cirrhosis Paternal Grandfather    History  Substance Use Topics  . Smoking status: Current Every Day Smoker -- 0.50 packs/day for 12 years    Types: Cigarettes  . Smokeless tobacco: Never Used     Comment: trying to quit  . Alcohol Use: Yes     Comment: wine cooler for occasion   OB History   Grav Para Term Preterm Abortions TAB SAB Ect Mult Living                 Review of Systems  Cardiovascular: Positive for leg swelling.  Musculoskeletal: Positive for back pain.  All other systems reviewed and are negative.     Allergies  Banana and Latex  Home Medications   Prior to Admission medications   Medication Sig Start Date End Date Taking? Authorizing Provider  cyclobenzaprine (FLEXERIL) 5 MG tablet Take 1 tablet (5 mg total) by mouth 3 (three) times daily as needed for muscle spasms. 11/16/13   Harden Mo, MD  ferrous sulfate 325 (65 FE) MG EC tablet Take 1  tablet (325 mg total) by mouth 2 (two) times daily with a meal. 08/04/13   Concha Norway, MD  HYDROcodone-acetaminophen (NORCO/VICODIN) 5-325 MG per tablet 1 to 2 tabs every 4 to 6 hours as needed for pain. 11/16/13   Harden Mo, MD  predniSONE (DELTASONE) 20 MG tablet Take 3 daily for 5 days, 2 daily for 5 days, 1 daily for 5 days. 11/16/13   Harden Mo, MD   BP 126/85  Pulse 99  Temp(Src) 97.9 F (36.6 C) (Oral)  Resp 18  SpO2 96% Physical Exam  Constitutional: She is oriented to person, place, and time. She appears well-developed and well-nourished. No distress.  HENT:  Head: Normocephalic and atraumatic.  Right Ear: Hearing normal.  Left Ear:  Hearing normal.  Nose: Nose normal.  Mouth/Throat: Oropharynx is clear and moist and mucous membranes are normal.  Eyes: Conjunctivae and EOM are normal. Pupils are equal, round, and reactive to light.  Neck: Normal range of motion. Neck supple.  Cardiovascular: Regular rhythm, S1 normal and S2 normal.  Exam reveals no gallop and no friction rub.   No murmur heard. Pulmonary/Chest: Effort normal and breath sounds normal. No respiratory distress. She exhibits no tenderness.  Abdominal: Soft. Normal appearance and bowel sounds are normal. There is no hepatosplenomegaly. There is no tenderness. There is no rebound, no guarding, no tenderness at McBurney's point and negative Murphy's sign. No hernia.  Musculoskeletal: Normal range of motion.       Lumbar back: She exhibits tenderness.       Back:  Neurological: She is alert and oriented to person, place, and time. She has normal strength. No cranial nerve deficit or sensory deficit. Coordination normal. GCS eye subscore is 4. GCS verbal subscore is 5. GCS motor subscore is 6.  Skin: Skin is warm, dry and intact. No rash noted. No cyanosis.  Psychiatric: She has a normal mood and affect. Her speech is normal and behavior is normal. Thought content normal.    ED Course  Procedures (including critical care time) Labs Review Labs Reviewed - No data to display  Imaging Review No results found.   EKG Interpretation None      MDM   Final diagnoses:  None   sciatica  Patient presents to the ER for evaluation of pain in her low back. Pain is in the left lower sacroiliac region. She does not have any midline tenderness to suggest herniated disc. She has normal neurologic function. Strength and sensation is intact. Patient does not have any saddle anesthesia or neurologic symptoms other than some pain radiating to the left buttock region. This is consistent with musculoskeletal pain, possible sciatica. Initial treatment from urgent care was  appropriate, but she has not had response. She does not require any urgent imaging at this time. She does have followup scheduled with neurosurgery but not for more than 3 weeks. She is waiting for a cancellation to be seen sooner. Analgesia provided in the ER tonight. We'll change to Percocet for stronger pain control. She was having itching with hydrocodone, likely some mild allergic reaction, we'll stop the hydrocodone. He had some Valium as needed for spasm. Continue the prednisone. The swelling in her feet is likely secondary to the prednisone, she was reassured that this will improve after the medication is finished.   Orpah Greek, MD 11/23/13 [phone]

## 2013-11-23 NOTE — ED Notes (Signed)
Pt presents with chronic lower back pain x2 weeks, pt reports a hx of "slipped disc" and DJD. Pt states the pain is radiating to her Left buttocks. Pt also c/o BLE swelling. Pt a/o x4. Pt denies any problems with bowel or bladder

## 2013-11-23 NOTE — ED Notes (Addendum)
Pt here with chronic back pain, seen here two weeks ago for same and given Flexeril and Vicodin with no relief. Pt also reports lower leg swelling starting yesterday. Pt denies CP, SOB. Denies urinary/bowel incontinence. Denies taking for pain. AO x4. NAD.

## 2014-03-27 ENCOUNTER — Emergency Department (HOSPITAL_COMMUNITY)
Admission: EM | Admit: 2014-03-27 | Discharge: 2014-03-27 | Disposition: A | Discharge status: Home / Self Care | Payer: Medicaid Other | Source: Home / Self Care | Attending: Emergency Medicine | Admitting: Emergency Medicine

## 2014-03-27 ENCOUNTER — Emergency Department (HOSPITAL_COMMUNITY): Payer: Medicaid Other

## 2014-03-27 ENCOUNTER — Emergency Department (HOSPITAL_COMMUNITY)
Admission: EM | Admit: 2014-03-27 | Discharge: 2014-03-27 | Disposition: A | Discharge status: Home / Self Care | Payer: Medicaid Other | Attending: Emergency Medicine | Admitting: Emergency Medicine

## 2014-03-27 ENCOUNTER — Encounter (HOSPITAL_COMMUNITY): Payer: Self-pay | Admitting: Emergency Medicine

## 2014-03-27 DIAGNOSIS — Z7952 Long term (current) use of systemic steroids: Secondary | ICD-10-CM

## 2014-03-27 DIAGNOSIS — E669 Obesity, unspecified: Secondary | ICD-10-CM

## 2014-03-27 DIAGNOSIS — M5430 Sciatica, unspecified side: Secondary | ICD-10-CM | POA: Insufficient documentation

## 2014-03-27 DIAGNOSIS — R52 Pain, unspecified: Secondary | ICD-10-CM

## 2014-03-27 DIAGNOSIS — W19XXXA Unspecified fall, initial encounter: Secondary | ICD-10-CM

## 2014-03-27 DIAGNOSIS — M1990 Unspecified osteoarthritis, unspecified site: Secondary | ICD-10-CM

## 2014-03-27 DIAGNOSIS — G43909 Migraine, unspecified, not intractable, without status migrainosus: Secondary | ICD-10-CM | POA: Diagnosis not present

## 2014-03-27 DIAGNOSIS — Z91040 Latex allergy status: Secondary | ICD-10-CM | POA: Insufficient documentation

## 2014-03-27 DIAGNOSIS — Y92009 Unspecified place in unspecified non-institutional (private) residence as the place of occurrence of the external cause: Secondary | ICD-10-CM | POA: Diagnosis not present

## 2014-03-27 DIAGNOSIS — G8911 Acute pain due to trauma: Secondary | ICD-10-CM | POA: Insufficient documentation

## 2014-03-27 DIAGNOSIS — S3992XA Unspecified injury of lower back, initial encounter: Secondary | ICD-10-CM | POA: Diagnosis not present

## 2014-03-27 DIAGNOSIS — M5432 Sciatica, left side: Secondary | ICD-10-CM | POA: Insufficient documentation

## 2014-03-27 DIAGNOSIS — Z8659 Personal history of other mental and behavioral disorders: Secondary | ICD-10-CM | POA: Diagnosis not present

## 2014-03-27 DIAGNOSIS — Y998 Other external cause status: Secondary | ICD-10-CM | POA: Insufficient documentation

## 2014-03-27 DIAGNOSIS — J45909 Unspecified asthma, uncomplicated: Secondary | ICD-10-CM

## 2014-03-27 DIAGNOSIS — Z720 Tobacco use: Secondary | ICD-10-CM

## 2014-03-27 DIAGNOSIS — G8929 Other chronic pain: Secondary | ICD-10-CM | POA: Insufficient documentation

## 2014-03-27 DIAGNOSIS — Z862 Personal history of diseases of the blood and blood-forming organs and certain disorders involving the immune mechanism: Secondary | ICD-10-CM | POA: Insufficient documentation

## 2014-03-27 DIAGNOSIS — S79912A Unspecified injury of left hip, initial encounter: Secondary | ICD-10-CM | POA: Insufficient documentation

## 2014-03-27 DIAGNOSIS — S79911A Unspecified injury of right hip, initial encounter: Secondary | ICD-10-CM | POA: Insufficient documentation

## 2014-03-27 DIAGNOSIS — Z87448 Personal history of other diseases of urinary system: Secondary | ICD-10-CM | POA: Insufficient documentation

## 2014-03-27 DIAGNOSIS — Z8742 Personal history of other diseases of the female genital tract: Secondary | ICD-10-CM | POA: Diagnosis not present

## 2014-03-27 DIAGNOSIS — Z8679 Personal history of other diseases of the circulatory system: Secondary | ICD-10-CM

## 2014-03-27 DIAGNOSIS — Z8719 Personal history of other diseases of the digestive system: Secondary | ICD-10-CM | POA: Insufficient documentation

## 2014-03-27 DIAGNOSIS — Y9389 Activity, other specified: Secondary | ICD-10-CM | POA: Insufficient documentation

## 2014-03-27 DIAGNOSIS — W07XXXA Fall from chair, initial encounter: Secondary | ICD-10-CM | POA: Insufficient documentation

## 2014-03-27 MED ORDER — METHOCARBAMOL 750 MG PO TABS: 750.0000 mg | ORAL_TABLET | Freq: Three times a day (TID) | ORAL | Status: DC | PRN

## 2014-03-27 MED ORDER — OXYCODONE-ACETAMINOPHEN 5-325 MG PO TABS: 1.0000 | ORAL_TABLET | ORAL | Status: DC | PRN

## 2014-03-27 MED ORDER — CYCLOBENZAPRINE HCL 10 MG PO TABS
5.0000 mg | ORAL_TABLET | Freq: Once | ORAL | Status: AC
  Administered 2014-03-27: 5 mg via ORAL
  Filled 2014-03-27: qty 1

## 2014-03-27 MED ORDER — METHOCARBAMOL 500 MG PO TABS
1000.0000 mg | ORAL_TABLET | Freq: Once | ORAL | Status: AC
  Administered 2014-03-27: 1000 mg via ORAL
  Filled 2014-03-27: qty 2

## 2014-03-27 MED ORDER — NAPROXEN 500 MG PO TABS: 500.0000 mg | ORAL_TABLET | Freq: Two times a day (BID) | ORAL | Status: DC

## 2014-03-27 MED ORDER — HYDROMORPHONE HCL 1 MG/ML IJ SOLN
2.0000 mg | Freq: Once | INTRAMUSCULAR | Status: AC
  Administered 2014-03-27: 2 mg via INTRAMUSCULAR
  Filled 2014-03-27: qty 2

## 2014-03-27 NOTE — Discharge Instructions (Signed)
Back Exercises Back exercises help treat and prevent back injuries. The goal of back exercises is to increase the strength of your abdominal and back muscles and the flexibility of your back. These exercises should be started when you no longer have back pain. Back exercises include:  Pelvic Tilt. Lie on your back with your knees bent. Tilt your pelvis until the lower part of your back is against the floor. Hold this position 5 to 10 sec and repeat 5 to 10 times.  Knee to Chest. Pull first 1 knee up against your chest and hold for 20 to 30 seconds, repeat this with the other knee, and then both knees. This may be done with the other leg straight or bent, whichever feels better.  Sit-Ups or Curl-Ups. Bend your knees 90 degrees. Start with tilting your pelvis, and do a partial, slow sit-up, lifting your trunk only 30 to 45 degrees off the floor. Take at least 2 to 3 seconds for each sit-up. Do not do sit-ups with your knees out straight. If partial sit-ups are difficult, simply do the above but with only tightening your abdominal muscles and holding it as directed.  Hip-Lift. Lie on your back with your knees flexed 90 degrees. Push down with your feet and shoulders as you raise your hips a couple inches off the floor; hold for 10 seconds, repeat 5 to 10 times.  Back arches. Lie on your stomach, propping yourself up on bent elbows. Slowly press on your hands, causing an arch in your low back. Repeat 3 to 5 times. Any initial stiffness and discomfort should lessen with repetition over time.  Shoulder-Lifts. Lie face down with arms beside your body. Keep hips and torso pressed to floor as you slowly lift your head and shoulders off the floor. Do not overdo your exercises, especially in the beginning. Exercises may cause you some mild back discomfort which lasts for a few minutes; however, if the pain is more severe, or lasts for more than 15 minutes, do not continue exercises until you see your caregiver.  Improvement with exercise therapy for back problems is slow.  See your caregivers for assistance with developing a proper back exercise program. Document Released: 04/19/2004 Document Revised: 06/04/2011 Document Reviewed: 01/11/2011 ExitCare Patient Information 2015 [employer]. This information is not intended to replace advice given to you by your health care provider. Make sure you discuss any questions you have with your health care provider.   Sciatica Sciatica is pain, weakness, numbness, or tingling along the path of the sciatic nerve. The nerve starts in the lower back and runs down the back of each leg. The nerve controls the muscles in the lower leg and in the back of the knee, while also providing sensation to the back of the thigh, lower leg, and the sole of your foot. Sciatica is a symptom of another medical condition. For instance, nerve damage or certain conditions, such as a herniated disk or bone spur on the spine, pinch or put pressure on the sciatic nerve. This causes the pain, weakness, or other sensations normally associated with sciatica. Generally, sciatica only affects one side of the body. CAUSES   Herniated or slipped disc.  Degenerative disk disease.  A pain disorder involving the narrow muscle in the buttocks (piriformis syndrome).  Pelvic injury or fracture.  Pregnancy.  Tumor (rare). SYMPTOMS  Symptoms can vary from mild to very severe. The symptoms usually travel from the low back to the buttocks and down the back   of the leg. Symptoms can include:  Mild tingling or dull aches in the lower back, leg, or hip.  Numbness in the back of the calf or sole of the foot.  Burning sensations in the lower back, leg, or hip.  Sharp pains in the lower back, leg, or hip.  Leg weakness.  Severe back pain inhibiting movement. These symptoms may get worse with coughing, sneezing, laughing, or prolonged sitting or standing. Also, being overweight may worsen  symptoms. DIAGNOSIS  Your caregiver will perform a physical exam to look for common symptoms of sciatica. He or she may ask you to do certain movements or activities that would trigger sciatic nerve pain. Other tests may be performed to find the cause of the sciatica. These may include:  Blood tests.  X-rays.  Imaging tests, such as an MRI or CT scan. TREATMENT  Treatment is directed at the cause of the sciatic pain. Sometimes, treatment is not necessary and the pain and discomfort goes away on its own. If treatment is needed, your caregiver may suggest:  Over-the-counter medicines to relieve pain.  Prescription medicines, such as anti-inflammatory medicine, muscle relaxants, or narcotics.  Applying heat or ice to the painful area.  Steroid injections to lessen pain, irritation, and inflammation around the nerve.  Reducing activity during periods of pain.  Exercising and stretching to strengthen your abdomen and improve flexibility of your spine. Your caregiver may suggest losing weight if the extra weight makes the back pain worse.  Physical therapy.  Surgery to eliminate what is pressing or pinching the nerve, such as a bone spur or part of a herniated disk. HOME CARE INSTRUCTIONS   Only take over-the-counter or prescription medicines for pain or discomfort as directed by your caregiver.  Apply ice to the affected area for 20 minutes, 3-4 times a day for the first 48-72 hours. Then try heat in the same way.  Exercise, stretch, or perform your usual activities if these do not aggravate your pain.  Attend physical therapy sessions as directed by your caregiver.  Keep all follow-up appointments as directed by your caregiver.  Do not wear high heels or shoes that do not provide proper support.  Check your mattress to see if it is too soft. A firm mattress may lessen your pain and discomfort. SEEK IMMEDIATE MEDICAL CARE IF:   You lose control of your bowel or bladder  (incontinence).  You have increasing weakness in the lower back, pelvis, buttocks, or legs.  You have redness or swelling of your back.  You have a burning sensation when you urinate.  You have pain that gets worse when you lie down or awakens you at night.  Your pain is worse than you have experienced in the past.  Your pain is lasting longer than 4 weeks.  You are suddenly losing weight without reason. MAKE SURE YOU:  Understand these instructions.  Will watch your condition.  Will get help right away if you are not doing well or get worse. Document Released: 03/06/2001 Document Revised: 09/11/2011 Document Reviewed: 07/22/2011 ExitCare Patient Information 2015 [employer]. This information is not intended to replace advice given to you by your health care provider. Make sure you discuss any questions you have with your health care provider.  

## 2014-03-27 NOTE — ED Provider Notes (Signed)
CSN: [id number]     Arrival date & time 03/27/14  0013 History  This chart was scribed for Candace Drape, MD by Tula Nakayama, ED Scribe. This patient was seen in room A01C/A01C and the patient's care was started at 1:31 AM.    Chief Complaint  Patient presents with  . Fall  . Back Pain   The history is provided by the patient. No language interpreter was used.    HPI Comments:  [name] is a 29 y.o. female who presents to the Emergency Department complaining of constant, left lower back pain that radiates to her left buttocks.  Pain has been intermittent for some time, but tonight worsened after she fell in a chair earlier this evening. Pt states that she hit her hips on the cement floor and the arm of the chair. She also reports hearing a pop and feeling a burning, tingling sensation in her gluteal muscles during the fall. Pt states pain is worse with movement and that it hurts to sit on the toilet. She has tried Advil and [employer] with no relief. She has also tried sitting in a chair and lying on the floor with her feet elevated with no change in symptoms. Pt has a history of back problems and last saw Dr. Ellene Route at [facility] on October 22. She states that she has been unable to follow-up on her care because medicaid denied an MRI. Pt had a cervical spine operation in 2014 and has a history of sciatica. She does not currently take any regular medications. Pt smokes cigarettes. Pt denies incontinence and dysuria as associated symptoms.  Past Medical History  Diagnosis Date  . Scoliosis   . Obesity   . Migraine   . Anxiety   . Gallstones   . Asthma   . Arthritis   . Pelvic inflammatory disease (PID) 05-08-11    previous hx. .Hx. childbirth x3-NVD  . Leukocytosis     going to hematologist  . History of renal failure   . Edema   . History of anemia    Past Surgical History  Procedure Laterality Date  . Abdominal exploration surgery  approx 29 years old    rlq(due to  adhesions around ovaries)-appendix and ovaries remain  . Childbirth    . Cholecystectomy  05/10/2011    Procedure: LAPAROSCOPIC CHOLECYSTECTOMY WITH INTRAOPERATIVE CHOLANGIOGRAM;  Surgeon: Gayland Curry, MD,FACS;  Location: [facility];  Service: General;  Laterality: N/A;  . Anterior cervical decomp/discectomy fusion N/A 12/09/2012    Procedure: ANTERIOR CERVICAL DECOMPRESSION/DISCECTOMY FUSION STRUCTURAL ALLOGRAFT TRESTLE PLATE CERVICAL FIVE-SIX,SIX-SEVEN;  Surgeon: Otilio Connors, MD;  Location: [place] [facility];  Service: Neurosurgery;  Laterality: N/A;   Family History  Problem Relation Age of Onset  . Prostate cancer Maternal Grandfather   . Cancer Maternal Grandfather     prostate  . Colon polyps Maternal Grandmother   . Diabetes Mother   . Endometriosis Mother   . Cancer Mother     lung  . Diabetes Father   . Heart disease Father   . Cirrhosis Paternal Grandfather    History  Substance Use Topics  . Smoking status: Current Every Day Smoker -- 0.50 packs/day for 12 years    Types: Cigarettes  . Smokeless tobacco: Never Used     Comment: trying to quit  . Alcohol Use: Yes     Comment: wine cooler for occasion   OB History    No data available     Review  of Systems  Genitourinary: Negative for dysuria.  Musculoskeletal: Positive for back pain and arthralgias.  All other systems reviewed and are negative.     Allergies  Banana and Latex  Home Medications   Prior to Admission medications   Medication Sig Start Date End Date Taking? Authorizing Provider  cyclobenzaprine (FLEXERIL) 5 MG tablet Take 1 tablet (5 mg total) by mouth 3 (three) times daily as needed for muscle spasms. 11/16/13   Harden Mo, MD  HYDROcodone-acetaminophen (NORCO/VICODIN) 5-325 MG per tablet Take 1 tablet by mouth every 6 (six) hours as needed for moderate pain.    Historical Provider, MD  oxyCODONE-acetaminophen (PERCOCET) 5-325 MG per tablet Take 2 tablets by mouth every 4 (four) hours as needed.  11/23/13   Orpah Greek, MD  predniSONE (DELTASONE) 20 MG tablet Take 60 mg by mouth daily with breakfast.    Historical Provider, MD   BP 109/70 mmHg  Pulse 73  Temp(Src) 98 F (36.7 C) (Oral)  Resp 14  Ht 5\' 3"  (1.6 m)  Wt 255 lb (115.667 kg)  BMI 45.18 kg/m2  SpO2 97%  LMP 03/19/2014 Physical Exam  Constitutional: She is oriented to person, place, and time. She appears well-developed and well-nourished. She appears distressed (patient appears to be in pain).  HENT:  Head: Normocephalic and atraumatic.  Nose: Nose normal.  Mouth/Throat: Oropharynx is clear and moist.  Eyes: Conjunctivae and EOM are normal. Pupils are equal, round, and reactive to light.  Neck: Normal range of motion. Neck supple. No JVD present. No tracheal deviation present. No thyromegaly present.  Cardiovascular: Normal rate, regular rhythm, normal heart sounds and intact distal pulses.  Exam reveals no gallop and no friction rub.   No murmur heard. Pulmonary/Chest: Effort normal and breath sounds normal. No stridor. No respiratory distress. She has no wheezes. She has no rales. She exhibits no tenderness.  Abdominal: Soft. Bowel sounds are normal. She exhibits no distension and no mass. There is no tenderness. There is no rebound and no guarding.  Musculoskeletal: She exhibits tenderness. She exhibits no edema.  Patient has tenderness over the left SI joint and left buttock.  There is no overlying skin changes.  Pain is worse with left leg raise.  She has normal sensation.  Normal DTRs.  Lymphadenopathy:    She has no cervical adenopathy.  Neurological: She is alert and oriented to person, place, and time. She displays normal reflexes. She exhibits normal muscle tone. Coordination normal.  Skin: Skin is warm and dry. No rash noted. No erythema. No pallor.  Psychiatric: She has a normal mood and affect. Her behavior is normal. Judgment and thought content normal.  Nursing note and vitals  reviewed.   ED Course  Procedures (including critical care time) DIAGNOSTIC STUDIES: Oxygen Saturation is 97% on RA, normal by my interpretation.    COORDINATION OF CARE: 1:40 AM Discussed treatment plan with pt at bedside and pt agreed to plan.   Labs Review Labs Reviewed - No data to display  Imaging Review No results found.   EKG Interpretation None      MDM   Final diagnoses:  Sciatica, left    29 year old female with acute on chronic left sciatic pain.  Patient to receive pain medication here.  No bowel or bladder incontinence, normal sensation and reflexes.  Patient has primary care doctor.  She can follow-up with.  I personally performed the services described in this documentation, which was scribed in my presence. The recorded information  has been reviewed and is accurate.     Candace Drape, MD 03/27/14 [phone]

## 2014-03-27 NOTE — Discharge Instructions (Signed)
Sciatica Sciatica is pain, weakness, numbness, or tingling along the path of the sciatic nerve. The nerve starts in the lower back and runs down the back of each leg. The nerve controls the muscles in the lower leg and in the back of the knee, while also providing sensation to the back of the thigh, lower leg, and the sole of your foot. Sciatica is a symptom of another medical condition. For instance, nerve damage or certain conditions, such as a herniated disk or bone spur on the spine, pinch or put pressure on the sciatic nerve. This causes the pain, weakness, or other sensations normally associated with sciatica. Generally, sciatica only affects one side of the body. CAUSES   Herniated or slipped disc.  Degenerative disk disease.  A pain disorder involving the narrow muscle in the buttocks (piriformis syndrome).  Pelvic injury or fracture.  Pregnancy.  Tumor (rare). SYMPTOMS  Symptoms can vary from mild to very severe. The symptoms usually travel from the low back to the buttocks and down the back of the leg. Symptoms can include:  Mild tingling or dull aches in the lower back, leg, or hip.  Numbness in the back of the calf or sole of the foot.  Burning sensations in the lower back, leg, or hip.  Sharp pains in the lower back, leg, or hip.  Leg weakness.  Severe back pain inhibiting movement. These symptoms may get worse with coughing, sneezing, laughing, or prolonged sitting or standing. Also, being overweight may worsen symptoms. DIAGNOSIS  Your caregiver will perform a physical exam to look for common symptoms of sciatica. He or she may ask you to do certain movements or activities that would trigger sciatic nerve pain. Other tests may be performed to find the cause of the sciatica. These may include:  Blood tests.  X-rays.  Imaging tests, such as an MRI or CT scan. TREATMENT  Treatment is directed at the cause of the sciatic pain. Sometimes, treatment is not necessary  and the pain and discomfort goes away on its own. If treatment is needed, your caregiver may suggest:  Over-the-counter medicines to relieve pain.  Prescription medicines, such as anti-inflammatory medicine, muscle relaxants, or narcotics.  Applying heat or ice to the painful area.  Steroid injections to lessen pain, irritation, and inflammation around the nerve.  Reducing activity during periods of pain.  Exercising and stretching to strengthen your abdomen and improve flexibility of your spine. Your caregiver may suggest losing weight if the extra weight makes the back pain worse.  Physical therapy.  Surgery to eliminate what is pressing or pinching the nerve, such as a bone spur or part of a herniated disk. HOME CARE INSTRUCTIONS   Only take over-the-counter or prescription medicines for pain or discomfort as directed by your caregiver.  Apply ice to the affected area for 20 minutes, 3-4 times a day for the first 48-72 hours. Then try heat in the same way.  Exercise, stretch, or perform your usual activities if these do not aggravate your pain.  Attend physical therapy sessions as directed by your caregiver.  Keep all follow-up appointments as directed by your caregiver.  Do not wear high heels or shoes that do not provide proper support.  Check your mattress to see if it is too soft. A firm mattress may lessen your pain and discomfort. SEEK IMMEDIATE MEDICAL CARE IF:   You lose control of your bowel or bladder (incontinence).  You have increasing weakness in the lower back, pelvis, buttocks,   or legs.  You have redness or swelling of your back.  You have a burning sensation when you urinate.  You have pain that gets worse when you lie down or awakens you at night.  Your pain is worse than you have experienced in the past.  Your pain is lasting longer than 4 weeks.  You are suddenly losing weight without reason. MAKE SURE YOU:  Understand these  instructions.  Will watch your condition.  Will get help right away if you are not doing well or get worse. Document Released: 03/06/2001 Document Revised: 09/11/2011 Document Reviewed: 07/22/2011 ExitCare Patient Information 2015 [employer]. This information is not intended to replace advice given to you by your health care provider. Make sure you discuss any questions you have with your health care provider.  

## 2014-03-27 NOTE — ED Notes (Signed)
Pt c/o fall and left hip pain onset last night. Pt seen here for same at time of injury but cannot afford the prescriptions.

## 2014-03-27 NOTE — ED Notes (Signed)
Pt. fell while sitting on chair this evening at home , reports pain at left lower back radiating to left buttocks

## 2014-03-27 NOTE — ED Provider Notes (Signed)
CSN: [id number]     Arrival date & time 03/27/14  1430 History  This chart was scribed for Glendell Docker, NP, working with Pamella Pert, MD by Steva Colder, ED Scribe. The patient was seen in room TR07C/TR07C at 4:13 PM.    Chief Complaint  Patient presents with  . Fall  . Hip Pain     The history is provided by the patient. No language interpreter was used.     HPI Comments: [name] is a 29 y.o. female who presents to the Emergency Department complaining of fall onset last night. She reports that she was seen here this morning at 4 AM for her symptoms. She notes that she is back because she cannot afford her Rx. She reports that she has not taken anything for her pain since this morning when she left. She notes that she was here and tx for sciatica. She was given Percocet, Robaxin, and Naprosyn. She reports that her sister was going to get her Rx for her but lost her bank card and is actively looking for it. She states that she is having associated symptoms of left hip pain. She denies any other symptoms.   Past Medical History  Diagnosis Date  . Scoliosis   . Obesity   . Migraine   . Anxiety   . Gallstones   . Asthma   . Arthritis   . Pelvic inflammatory disease (PID) 05-08-11    previous hx. .Hx. childbirth x3-NVD  . Leukocytosis     going to hematologist  . History of renal failure   . Edema   . History of anemia    Past Surgical History  Procedure Laterality Date  . Abdominal exploration surgery  approx 29 years old    rlq(due to adhesions around ovaries)-appendix and ovaries remain  . Childbirth    . Cholecystectomy  05/10/2011    Procedure: LAPAROSCOPIC CHOLECYSTECTOMY WITH INTRAOPERATIVE CHOLANGIOGRAM;  Surgeon: Gayland Curry, MD,FACS;  Location: [facility];  Service: General;  Laterality: N/A;  . Anterior cervical decomp/discectomy fusion N/A 12/09/2012    Procedure: ANTERIOR CERVICAL DECOMPRESSION/DISCECTOMY FUSION STRUCTURAL ALLOGRAFT TRESTLE PLATE CERVICAL  FIVE-SIX,SIX-SEVEN;  Surgeon: Otilio Connors, MD;  Location: [place] [facility];  Service: Neurosurgery;  Laterality: N/A;   Family History  Problem Relation Age of Onset  . Prostate cancer Maternal Grandfather   . Cancer Maternal Grandfather     prostate  . Colon polyps Maternal Grandmother   . Diabetes Mother   . Endometriosis Mother   . Cancer Mother     lung  . Diabetes Father   . Heart disease Father   . Cirrhosis Paternal Grandfather    History  Substance Use Topics  . Smoking status: Current Every Day Smoker -- 0.50 packs/day for 12 years    Types: Cigarettes  . Smokeless tobacco: Never Used     Comment: trying to quit  . Alcohol Use: Yes     Comment: wine cooler for occasion   OB History    No data available     Review of Systems  Musculoskeletal: Positive for myalgias and arthralgias.  All other systems reviewed and are negative.     Allergies  Banana and Latex  Home Medications   Prior to Admission medications   Medication Sig Start Date End Date Taking? Authorizing Provider  ibuprofen (ADVIL,MOTRIN) 200 MG tablet Take 200 mg by mouth every 6 (six) hours as needed for moderate pain.    Historical Provider, MD  methocarbamol (ROBAXIN-750) 750  MG tablet Take 1 tablet (750 mg total) by mouth every 8 (eight) hours as needed for muscle spasms. 03/27/14   [name], MD  naproxen (NAPROSYN) 500 MG tablet Take 1 tablet (500 mg total) by mouth 2 (two) times daily. 03/27/14   [name], MD  oxyCODONE-acetaminophen (PERCOCET) 5-325 MG per tablet Take 1-2 tablets by mouth every 4 (four) hours as needed. 03/27/14   [name], MD  predniSONE (DELTASONE) 20 MG tablet Take 60 mg by mouth daily with breakfast.    Historical Provider, MD   BP 95/66 mmHg  Pulse 82  Temp(Src) 98.3 F (36.8 C)  Resp 16  Ht 5' 3.5" (1.613 m)  Wt 255 lb (115.667 kg)  BMI 44.46 kg/m2  SpO2 97%  LMP 03/19/2014  Physical Exam  Constitutional: She is oriented to person, place, and time. She  appears well-developed and well-nourished. No distress.  HENT:  Head: Normocephalic and atraumatic.  Eyes: EOM are normal.  Neck: Neck supple. No tracheal deviation present.  Cardiovascular: Normal rate.   Pulmonary/Chest: Effort normal. No respiratory distress.  Musculoskeletal: Normal range of motion.  Tender on the left si joint and buttock. Pt has full rom of legs. Normal senstation  Neurological: She is alert and oriented to person, place, and time.  Skin: Skin is warm and dry.  Psychiatric: She has a normal mood and affect. Her behavior is normal.  Nursing note and vitals reviewed.   ED Course  Procedures (including critical care time) DIAGNOSTIC STUDIES: Oxygen Saturation is 97% on room air, normal by my interpretation.    COORDINATION OF CARE: 4:17 PM-Discussed treatment plan which includes calling social worker with pt at bedside and pt agreed to plan.   Labs Review Labs Reviewed - No data to display  Imaging Review Dg Lumbar Spine Complete  03/27/2014   CLINICAL DATA:  [place] on 03/26/2014.  Lower lumbar pain.  EXAM: LUMBAR SPINE - COMPLETE 4+ VIEW  COMPARISON:  12/17/2013  FINDINGS: Mild lumbar scoliosis convex towards the left. No anterior subluxation of lumbar vertebrae. Facet joints demonstrate normal alignment. No vertebral compression deformities. Mild endplate hypertrophic changes are suggested. Intervertebral disc space heights are preserved. No focal bone lesion or bone destruction is appreciated. Surgical clips in the right upper quadrant.  IMPRESSION: Mild lumbar scoliosis.  No acute bony abnormalities.   Electronically Signed   By: Lucienne Capers M.D.   On: 03/27/2014 01:21     EKG Interpretation None      MDM   Final diagnoses:  Sciatica, unspecified laterality   Pt talked with Education officer, museum and worked out the medication problem  I personally performed the services described in this documentation, which was scribed in my presence. The recorded  information has been reviewed and is accurate.    Glendell Docker, NP 03/27/14 [place], MD 03/28/14 1121

## 2014-03-28 NOTE — Progress Notes (Signed)
  CARE MANAGEMENT ED NOTE 03/28/2014  Patient:  [name], [name]   Account Number:  [id number]  Date Initiated:  03/27/2014  Documentation initiated by:  [facility]  Subjective/Objective Assessment:     Subjective/Objective Assessment Detail:   Patient presents to the [facility] Emergency Department complaining of fall onset last night. this is patient 2nd visit in 24 hours. She states, she returned because she is unable to afford her prescriptions     Action/Plan:   Medication assistance   Action/Plan Detail:   [facility]   Anticipated DC Date:  03/27/2014     Status Recommendation to Physician:   Result of Recommendation:  Agreed    DC Planning Services  CM consult  [facility]    Choice offered to / List presented to:  C-1 Patient          Status of service:  Completed, signed off  ED Comments:   ED Comments Detail:  ED CM consulted by Luellen Pucker RN concerning medication assistance. Reviewed record, PCP [facility] Selina Cooley, met with patient at bedside patient confirmed information. . Met with patient at bedside, patient reports that she no longer has Medicaid that she is in the process of reapplying. She states, she is unable to afford her prescriptions. Discussed the [facility] Program and the guidelines, and $3 per prescription co-payment. Patient verbalized understanding and agreeable with plan. Patient was enrolled and [place] letter printed and given to patient with instructions. Patient also encourage to contact PCP to schedule f/u appt.  Patient verbalized understanding tech back done. Updated Luellen Pucker RN and Aleene Davidson PA-C with discharge plan. No further ED CM needs identified.

## 2014-04-01 ENCOUNTER — Encounter (HOSPITAL_COMMUNITY): Payer: Self-pay | Admitting: Emergency Medicine

## 2014-04-01 ENCOUNTER — Emergency Department (HOSPITAL_COMMUNITY)
Admission: EM | Admit: 2014-04-01 | Discharge: 2014-04-01 | Disposition: A | Discharge status: Home / Self Care | Payer: Medicaid Other | Attending: Emergency Medicine | Admitting: Emergency Medicine

## 2014-04-01 DIAGNOSIS — Z87448 Personal history of other diseases of urinary system: Secondary | ICD-10-CM | POA: Insufficient documentation

## 2014-04-01 DIAGNOSIS — Z862 Personal history of diseases of the blood and blood-forming organs and certain disorders involving the immune mechanism: Secondary | ICD-10-CM | POA: Diagnosis not present

## 2014-04-01 DIAGNOSIS — Z79899 Other long term (current) drug therapy: Secondary | ICD-10-CM | POA: Insufficient documentation

## 2014-04-01 DIAGNOSIS — R197 Diarrhea, unspecified: Secondary | ICD-10-CM | POA: Diagnosis present

## 2014-04-01 DIAGNOSIS — M1990 Unspecified osteoarthritis, unspecified site: Secondary | ICD-10-CM | POA: Diagnosis not present

## 2014-04-01 DIAGNOSIS — J45909 Unspecified asthma, uncomplicated: Secondary | ICD-10-CM | POA: Diagnosis not present

## 2014-04-01 DIAGNOSIS — E669 Obesity, unspecified: Secondary | ICD-10-CM | POA: Insufficient documentation

## 2014-04-01 DIAGNOSIS — Z720 Tobacco use: Secondary | ICD-10-CM | POA: Insufficient documentation

## 2014-04-01 DIAGNOSIS — Z791 Long term (current) use of non-steroidal anti-inflammatories (NSAID): Secondary | ICD-10-CM | POA: Diagnosis not present

## 2014-04-01 DIAGNOSIS — Z8742 Personal history of other diseases of the female genital tract: Secondary | ICD-10-CM | POA: Insufficient documentation

## 2014-04-01 DIAGNOSIS — Z91040 Latex allergy status: Secondary | ICD-10-CM | POA: Insufficient documentation

## 2014-04-01 DIAGNOSIS — M5432 Sciatica, left side: Secondary | ICD-10-CM | POA: Insufficient documentation

## 2014-04-01 MED ORDER — HYDROMORPHONE HCL 2 MG/ML IJ SOLN
2.0000 mg | Freq: Once | INTRAMUSCULAR | Status: AC
  Administered 2014-04-01: 2 mg via INTRAMUSCULAR
  Filled 2014-04-01: qty 1

## 2014-04-01 MED ORDER — CYCLOBENZAPRINE HCL 10 MG PO TABS: 10.0000 mg | ORAL_TABLET | Freq: Two times a day (BID) | ORAL | Status: DC | PRN

## 2014-04-01 MED ORDER — DIAZEPAM 5 MG PO TABS
5.0000 mg | ORAL_TABLET | Freq: Once | ORAL | Status: AC
Start: 2014-04-01 — End: 2014-04-01
  Administered 2014-04-01: 5 mg via ORAL
  Filled 2014-04-01: qty 1

## 2014-04-01 NOTE — Progress Notes (Addendum)
  CARE MANAGEMENT ED NOTE 04/01/2014  Patient:  TATEANNA, BACH   Account Number:  [id number]  Date Initiated:  04/01/2014  Documentation initiated by:  Jackelyn Poling  Subjective/Objective Assessment:   29 yr old self pay [employer] (previously with medicaid in 2015 for her & her children but she confirms she let medicaid coverage end before re applying-did re apply but she is no longer covered as of today)     Subjective/Objective Assessment Detail:   Pt with 5 ED visits in last 6 months (July 03, 2013 -[facility] 11/21/13- [facility], 11/23/13- [facility], 03/27/14 -[facility], 04/01/14-[facility] for sciatica s/s Pt informed CM she did not feel she was helped at [facility]   pcp listed as [facility]  Pt reports calling on 03/31/14 to get an appointment but informed she could not be seen if medicaid not resolved  Reports children 9, 10  Pt voiced her frustration about not completing medicaid app on time, and not being able to go to pcp to be seen until medicaid resolved Pt reports to Cm she has filled bottle of flexeril at home     Action/Plan:   ED CM received a call from [facility] staff after seeing pt Stating pt interested in information about her [facility] letter Spoke with pt who wanted to know if the EDP could write prescriptions for her to get her home meds that are now at [facility]   Action/Plan Detail:   (but previously written by another provider) so she could get them also with [facility] letter CM discussed differences in pcp & EDPs -EDPs not pcp to write monthly renewals for RX but pcp does Cm informed her she would ask EDP if he would   Anticipated DC Date:  04/01/2014     Status Recommendation to Physician:   Result of Recommendation:    Other ED Services  Consult Working [place]  Other  PCP issues  [facility] / [facility] (established/new)  Outpatient Services - Pt will follow up  Medication Assistance    Choice offered to / List presented to:            Status of service:  Completed,  signed off  ED Comments:  Explained that EDPs provider services to get pt out of crisis/emergent situations and allow pcp to manage chronic or ongoing medical conditions  ED Comments Detail:  CM spoke with Dr Mingo Amber who agrees to assist pt with Rx for home medications once Pt updated  CM spoke with pt who confirms self pay [employer] resident. CM discussed [facility], affordable care act, financial assistance, DSS and  health department  Pt voiced understanding and appreciation of resources provided Pt ha [facility] contact information from [facility] staff CM strongly encouraged pt to follow up with [facility], DSS & affordable care act  CM Re reviewed with the pt about [facility] MATCH program ($3 co pay for each Rx through [facility] program, does not include refills, 7 day expiration of MATCH letter and choice of pharmacies) Pt [place] letter given per EPIC on 03/27/14 therefore will expire on 04/03/14

## 2014-04-01 NOTE — ED Notes (Signed)
Pt with Hx of sciatica c/o diarrhea, emesis, RLQ abdominal pain, epistaxis, states that diarrhea has been worsening her sciatica pain. C/o severe pain in buttocks.

## 2014-04-01 NOTE — Discharge Instructions (Signed)
Sciatica Sciatica is pain, weakness, numbness, or tingling along the path of the sciatic nerve. The nerve starts in the lower back and runs down the back of each leg. The nerve controls the muscles in the lower leg and in the back of the knee, while also providing sensation to the back of the thigh, lower leg, and the sole of your foot. Sciatica is a symptom of another medical condition. For instance, nerve damage or certain conditions, such as a herniated disk or bone spur on the spine, pinch or put pressure on the sciatic nerve. This causes the pain, weakness, or other sensations normally associated with sciatica. Generally, sciatica only affects one side of the body. CAUSES   Herniated or slipped disc.  Degenerative disk disease.  A pain disorder involving the narrow muscle in the buttocks (piriformis syndrome).  Pelvic injury or fracture.  Pregnancy.  Tumor (rare). SYMPTOMS  Symptoms can vary from mild to very severe. The symptoms usually travel from the low back to the buttocks and down the back of the leg. Symptoms can include:  Mild tingling or dull aches in the lower back, leg, or hip.  Numbness in the back of the calf or sole of the foot.  Burning sensations in the lower back, leg, or hip.  Sharp pains in the lower back, leg, or hip.  Leg weakness.  Severe back pain inhibiting movement. These symptoms may get worse with coughing, sneezing, laughing, or prolonged sitting or standing. Also, being overweight may worsen symptoms. DIAGNOSIS  Your caregiver will perform a physical exam to look for common symptoms of sciatica. He or she may ask you to do certain movements or activities that would trigger sciatic nerve pain. Other tests may be performed to find the cause of the sciatica. These may include:  Blood tests.  X-rays.  Imaging tests, such as an MRI or CT scan. TREATMENT  Treatment is directed at the cause of the sciatic pain. Sometimes, treatment is not necessary  and the pain and discomfort goes away on its own. If treatment is needed, your caregiver may suggest:  Over-the-counter medicines to relieve pain.  Prescription medicines, such as anti-inflammatory medicine, muscle relaxants, or narcotics.  Applying heat or ice to the painful area.  Steroid injections to lessen pain, irritation, and inflammation around the nerve.  Reducing activity during periods of pain.  Exercising and stretching to strengthen your abdomen and improve flexibility of your spine. Your caregiver may suggest losing weight if the extra weight makes the back pain worse.  Physical therapy.  Surgery to eliminate what is pressing or pinching the nerve, such as a bone spur or part of a herniated disk. HOME CARE INSTRUCTIONS   Only take over-the-counter or prescription medicines for pain or discomfort as directed by your caregiver.  Apply ice to the affected area for 20 minutes, 3-4 times a day for the first 48-72 hours. Then try heat in the same way.  Exercise, stretch, or perform your usual activities if these do not aggravate your pain.  Attend physical therapy sessions as directed by your caregiver.  Keep all follow-up appointments as directed by your caregiver.  Do not wear high heels or shoes that do not provide proper support.  Check your mattress to see if it is too soft. A firm mattress may lessen your pain and discomfort. SEEK IMMEDIATE MEDICAL CARE IF:   You lose control of your bowel or bladder (incontinence).  You have increasing weakness in the lower back, pelvis, buttocks,   or legs.  You have redness or swelling of your back.  You have a burning sensation when you urinate.  You have pain that gets worse when you lie down or awakens you at night.  Your pain is worse than you have experienced in the past.  Your pain is lasting longer than 4 weeks.  You are suddenly losing weight without reason. MAKE SURE YOU:  Understand these  instructions.  Will watch your condition.  Will get help right away if you are not doing well or get worse. Document Released: 03/06/2001 Document Revised: 09/11/2011 Document Reviewed: 07/22/2011 ExitCare Patient Information 2015 [employer]. This information is not intended to replace advice given to you by your health care provider. Make sure you discuss any questions you have with your health care provider.  

## 2014-04-01 NOTE — ED Provider Notes (Signed)
CSN: [id number]     Arrival date & time 04/01/14  0844 History   First MD Initiated Contact with Patient 04/01/14 [phone]     Chief Complaint  Patient presents with  . Diarrhea     (Consider location/radiation/quality/duration/timing/severity/associated sxs/prior Treatment) Patient is a 29 y.o. female presenting with back pain. The history is provided by the patient.  Back Pain Location:  Lumbar spine Quality:  Aching Radiates to:  L posterior upper leg Pain severity:  Severe Pain is:  Same all the time Onset quality:  Gradual Duration:  2 days Timing:  Constant Progression:  Unchanged Chronicity:  Chronic Context: falling (fall off a chair - was sitting on the arm of the chair and the chair fell over. She landed on some toys)   Relieved by:  Nothing Worsened by:  Movement and standing Ineffective treatments:  Muscle relaxants (Percocet) Associated symptoms: numbness and tingling   Associated symptoms: no fever and no perianal numbness     Past Medical History  Diagnosis Date  . Scoliosis   . Obesity   . Migraine   . Anxiety   . Gallstones   . Asthma   . Arthritis   . Pelvic inflammatory disease (PID) 05-08-11    previous hx. .Hx. childbirth x3-NVD  . Leukocytosis     going to hematologist  . History of renal failure   . Edema   . History of anemia    Past Surgical History  Procedure Laterality Date  . Abdominal exploration surgery  approx 29 years old    rlq(due to adhesions around ovaries)-appendix and ovaries remain  . Childbirth    . Cholecystectomy  05/10/2011    Procedure: LAPAROSCOPIC CHOLECYSTECTOMY WITH INTRAOPERATIVE CHOLANGIOGRAM;  Surgeon: Gayland Curry, MD,FACS;  Location: [facility];  Service: General;  Laterality: N/A;  . Anterior cervical decomp/discectomy fusion N/A 12/09/2012    Procedure: ANTERIOR CERVICAL DECOMPRESSION/DISCECTOMY FUSION STRUCTURAL ALLOGRAFT TRESTLE PLATE CERVICAL FIVE-SIX,SIX-SEVEN;  Surgeon: Otilio Connors, MD;  Location: [place] [facility];   Service: Neurosurgery;  Laterality: N/A;   Family History  Problem Relation Age of Onset  . Prostate cancer Maternal Grandfather   . Cancer Maternal Grandfather     prostate  . Colon polyps Maternal Grandmother   . Diabetes Mother   . Endometriosis Mother   . Cancer Mother     lung  . Diabetes Father   . Heart disease Father   . Cirrhosis Paternal Grandfather    History  Substance Use Topics  . Smoking status: Current Every Day Smoker -- 0.50 packs/day for 12 years    Types: Cigarettes  . Smokeless tobacco: Never Used     Comment: trying to quit  . Alcohol Use: Yes     Comment: wine cooler for occasion   OB History    No data available     Review of Systems  Constitutional: Negative for fever.  Respiratory: Negative for cough and shortness of breath.   Musculoskeletal: Positive for back pain.  Neurological: Positive for tingling and numbness.  All other systems reviewed and are negative.     Allergies  Banana and Latex  Home Medications   Prior to Admission medications   Medication Sig Start Date End Date Taking? Authorizing Provider  ibuprofen (ADVIL,MOTRIN) 200 MG tablet Take 200 mg by mouth every 6 (six) hours as needed for moderate pain.    Historical Provider, MD  methocarbamol (ROBAXIN-750) 750 MG tablet Take 1 tablet (750 mg total) by mouth every 8 (eight) hours  as needed for muscle spasms. 03/27/14   [name], MD  naproxen (NAPROSYN) 500 MG tablet Take 1 tablet (500 mg total) by mouth 2 (two) times daily. 03/27/14   [name], MD  oxyCODONE-acetaminophen (PERCOCET) 5-325 MG per tablet Take 1-2 tablets by mouth every 4 (four) hours as needed. 03/27/14   [name], MD  predniSONE (DELTASONE) 20 MG tablet Take 60 mg by mouth daily with breakfast.    Historical Provider, MD   BP 133/87 mmHg  Pulse 107  Temp(Src) 97.9 F (36.6 C) (Oral)  Resp 18  SpO2 98%  LMP 03/19/2014 Physical Exam  Constitutional: She is oriented to person, place, and time. She  appears well-developed and well-nourished. No distress.  HENT:  Head: Normocephalic and atraumatic.  Mouth/Throat: Oropharynx is clear and moist.  Eyes: EOM are normal. Pupils are equal, round, and reactive to light.  Neck: Normal range of motion. Neck supple.  Cardiovascular: Normal rate and regular rhythm.  Exam reveals no friction rub.   No murmur heard. Pulmonary/Chest: Effort normal and breath sounds normal. No respiratory distress. She has no wheezes. She has no rales.  Abdominal: Soft. She exhibits no distension. There is no tenderness. There is no rebound.  Musculoskeletal: Normal range of motion. She exhibits no edema.  Neurological: She is alert and oriented to person, place, and time. A sensory deficit (L leg numbness) is present. No cranial nerve deficit. GCS eye subscore is 4. GCS verbal subscore is 5. GCS motor subscore is 6.  Reflex Scores:      Patellar reflexes are 2+ on the left side. Skin: No rash noted. She is not diaphoretic.  Nursing note and vitals reviewed.   ED Course  Procedures (including critical care time) Labs Review Labs Reviewed  CBC WITH DIFFERENTIAL  COMPREHENSIVE METABOLIC PANEL  URINALYSIS, ROUTINE W REFLEX MICROSCOPIC  POC URINE PREG, ED    Imaging Review No results found.   EKG Interpretation None      MDM   Final diagnoses:  Left sided sciatica    61F here with L sided sciatica pain. Patient fell of a chair exacerbating her prior history of sciatica. No relief with percocet, robaxin. L leg with reflex intact. Movement intact, some sensory deficit. L buttock TTP. Will treat her sciatica with dilaudid IM and valuim. No concern for cord compression - denies perianal numbness/tingling, no loss of control of bowel/bladder. Brisk reflex in L leg. Feeling somewhat improved after a few doses of pain medicine. Stable for discharge.    Evelina Bucy, MD 04/02/14 [phone]

## 2014-04-07 ENCOUNTER — Emergency Department (HOSPITAL_COMMUNITY)
Admission: EM | Admit: 2014-04-07 | Discharge: 2014-04-07 | Disposition: A | Discharge status: Home / Self Care | Payer: Medicaid Other | Attending: Emergency Medicine | Admitting: Emergency Medicine

## 2014-04-07 DIAGNOSIS — F419 Anxiety disorder, unspecified: Secondary | ICD-10-CM | POA: Diagnosis not present

## 2014-04-07 DIAGNOSIS — Z87448 Personal history of other diseases of urinary system: Secondary | ICD-10-CM | POA: Diagnosis not present

## 2014-04-07 DIAGNOSIS — Z862 Personal history of diseases of the blood and blood-forming organs and certain disorders involving the immune mechanism: Secondary | ICD-10-CM | POA: Insufficient documentation

## 2014-04-07 DIAGNOSIS — G8929 Other chronic pain: Secondary | ICD-10-CM | POA: Diagnosis not present

## 2014-04-07 DIAGNOSIS — Z79899 Other long term (current) drug therapy: Secondary | ICD-10-CM | POA: Diagnosis not present

## 2014-04-07 DIAGNOSIS — Z8719 Personal history of other diseases of the digestive system: Secondary | ICD-10-CM | POA: Insufficient documentation

## 2014-04-07 DIAGNOSIS — G43909 Migraine, unspecified, not intractable, without status migrainosus: Secondary | ICD-10-CM | POA: Diagnosis not present

## 2014-04-07 DIAGNOSIS — J45909 Unspecified asthma, uncomplicated: Secondary | ICD-10-CM | POA: Insufficient documentation

## 2014-04-07 DIAGNOSIS — M419 Scoliosis, unspecified: Secondary | ICD-10-CM | POA: Diagnosis not present

## 2014-04-07 DIAGNOSIS — Z91040 Latex allergy status: Secondary | ICD-10-CM | POA: Insufficient documentation

## 2014-04-07 DIAGNOSIS — Z720 Tobacco use: Secondary | ICD-10-CM | POA: Diagnosis not present

## 2014-04-07 DIAGNOSIS — M5440 Lumbago with sciatica, unspecified side: Secondary | ICD-10-CM | POA: Diagnosis not present

## 2014-04-07 DIAGNOSIS — M5430 Sciatica, unspecified side: Secondary | ICD-10-CM

## 2014-04-07 DIAGNOSIS — Z8742 Personal history of other diseases of the female genital tract: Secondary | ICD-10-CM | POA: Diagnosis not present

## 2014-04-07 DIAGNOSIS — M549 Dorsalgia, unspecified: Secondary | ICD-10-CM | POA: Diagnosis present

## 2014-04-07 DIAGNOSIS — M1990 Unspecified osteoarthritis, unspecified site: Secondary | ICD-10-CM | POA: Insufficient documentation

## 2014-04-07 MED ORDER — OXYCODONE-ACETAMINOPHEN 5-325 MG PO TABS: 1.0000 | ORAL_TABLET | Freq: Four times a day (QID) | ORAL | Status: DC | PRN

## 2014-04-07 MED ORDER — CYCLOBENZAPRINE HCL 10 MG PO TABS: 10.0000 mg | ORAL_TABLET | Freq: Three times a day (TID) | ORAL | Status: DC | PRN

## 2014-04-07 MED ORDER — PREDNISONE 20 MG PO TABS: ORAL_TABLET | ORAL | Status: DC

## 2014-04-07 MED ORDER — CYCLOBENZAPRINE HCL 10 MG PO TABS
10.0000 mg | ORAL_TABLET | Freq: Three times a day (TID) | ORAL | Status: DC | PRN
Start: 2014-04-07 — End: 2014-06-13

## 2014-04-07 NOTE — Discharge Instructions (Signed)
Back Pain, Adult °Back pain is very common. The pain often gets better over time. The cause of back pain is usually not dangerous. Most people can learn to manage their back pain on their own.  °HOME CARE  °· Stay active. Start with short walks on flat ground if you can. Try to walk farther each day. °· Do not sit, drive, or stand in one place for more than 30 minutes. Do not stay in bed. °· Do not avoid exercise or work. Activity can help your back heal faster. °· Be careful when you bend or lift an object. Bend at your knees, keep the object close to you, and do not twist. °· Sleep on a firm mattress. Lie on your side, and bend your knees. If you lie on your back, put a pillow under your knees. °· Only take medicines as told by your doctor. °· Put ice on the injured area. °¨ Put ice in a plastic bag. °¨ Place a towel between your skin and the bag. °¨ Leave the ice on for 15-20 minutes, 03-04 times a day for the first 2 to 3 days. After that, you can switch between ice and heat packs. °· Ask your doctor about back exercises or massage. °· Avoid feeling anxious or stressed. Find good ways to deal with stress, such as exercise. °GET HELP RIGHT AWAY IF:  °· Your pain does not go away with rest or medicine. °· Your pain does not go away in 1 week. °· You have new problems. °· You do not feel well. °· The pain spreads into your legs. °· You cannot control when you poop (bowel movement) or pee (urinate). °· Your arms or legs feel weak or lose feeling (numbness). °· You feel sick to your stomach (nauseous) or throw up (vomit). °· You have belly (abdominal) pain. °· You feel like you may pass out (faint). °MAKE SURE YOU:  °· Understand these instructions. °· Will watch your condition. °· Will get help right away if you are not doing well or get worse. °Document Released: 08/29/2007 Document Revised: 06/04/2011 Document Reviewed: 07/14/2013 °ExitCare® Patient Information ©2015 [employer]. This information is not intended  to replace advice given to you by your health care provider. Make sure you discuss any questions you have with your health care provider. ° °

## 2014-04-07 NOTE — ED Notes (Signed)
While wheeling pt to discharge, pt stated that her sister was not going to be able to pick her up. Pt had been specifically asked before narcotics administration if she had someone to take her home. Pt stated that her sister was going to give her a ride. Pt was then asked how she arrived at the ED and stated that her sister had dropped her off. Narcotics were administered at 0320. Pt informed that se would not be able to leave without a ride home. Pt returned to Bed 23 so she could call someone for a ride. Pt upset that she is not able to drive herself home at this time.

## 2014-04-07 NOTE — ED Provider Notes (Signed)
CSN: [id number]     Arrival date & time 04/07/14  0154 History   First MD Initiated Contact with Patient 04/07/14 0507     No chief complaint on file.    (Consider location/radiation/quality/duration/timing/severity/associated sxs/prior Treatment) HPI Comments: Chronic back pain reports fell last week had had increase pain snice  Was given epidural injection with little relief  Also out of most of her chronic pain mediations   The history is provided by the patient.    Past Medical History  Diagnosis Date  . Scoliosis   . Obesity   . Migraine   . Anxiety   . Gallstones   . Asthma   . Arthritis   . Pelvic inflammatory disease (PID) 05-08-11    previous hx. .Hx. childbirth x3-NVD  . Leukocytosis     going to hematologist  . History of renal failure   . Edema   . History of anemia    Past Surgical History  Procedure Laterality Date  . Abdominal exploration surgery  approx 29 years old    rlq(due to adhesions around ovaries)-appendix and ovaries remain  . Childbirth    . Cholecystectomy  05/10/2011    Procedure: LAPAROSCOPIC CHOLECYSTECTOMY WITH INTRAOPERATIVE CHOLANGIOGRAM;  Surgeon: Gayland Curry, MD,FACS;  Location: [facility];  Service: General;  Laterality: N/A;  . Anterior cervical decomp/discectomy fusion N/A 12/09/2012    Procedure: ANTERIOR CERVICAL DECOMPRESSION/DISCECTOMY FUSION STRUCTURAL ALLOGRAFT TRESTLE PLATE CERVICAL FIVE-SIX,SIX-SEVEN;  Surgeon: Otilio Connors, MD;  Location: [place] [facility];  Service: Neurosurgery;  Laterality: N/A;   Family History  Problem Relation Age of Onset  . Prostate cancer Maternal Grandfather   . Cancer Maternal Grandfather     prostate  . Colon polyps Maternal Grandmother   . Diabetes Mother   . Endometriosis Mother   . Cancer Mother     lung  . Diabetes Father   . Heart disease Father   . Cirrhosis Paternal Grandfather    History  Substance Use Topics  . Smoking status: Current Every Day Smoker -- 0.50 packs/day for 12 years   Types: Cigarettes  . Smokeless tobacco: Never Used     Comment: trying to quit  . Alcohol Use: Yes     Comment: wine cooler for occasion   OB History    No data available     Review of Systems  Constitutional: Negative for fever.  Genitourinary: Negative for dysuria.  Musculoskeletal: Positive for back pain.  Skin: Negative for rash and wound.  Neurological: Negative for numbness and headaches.  All other systems reviewed and are negative.     Allergies  Banana and Latex  Home Medications   Prior to Admission medications   Medication Sig Start Date End Date Taking? Authorizing Provider  cyclobenzaprine (FLEXERIL) 10 MG tablet Take 1 tablet (10 mg total) by mouth 3 (three) times daily as needed for muscle spasms. 04/07/14   Garald Balding, NP  gabapentin (NEURONTIN) 300 MG capsule Take 600 mg by mouth 3 (three) times daily.    Historical Provider, MD  ibuprofen (ADVIL,MOTRIN) 200 MG tablet Take 800 mg by mouth every 6 (six) hours as needed for moderate pain.     Historical Provider, MD  methocarbamol (ROBAXIN-750) 750 MG tablet Take 1 tablet (750 mg total) by mouth every 8 (eight) hours as needed for muscle spasms. 03/27/14   [name], MD  naproxen (NAPROSYN) 500 MG tablet Take 1 tablet (500 mg total) by mouth 2 (two) times daily. 03/27/14  [name], MD  OVER THE COUNTER MEDICATION Place 2 drops into the left ear once. Ear drops.    Historical Provider, MD  oxyCODONE-acetaminophen (PERCOCET) 5-325 MG per tablet Take 1 tablet by mouth every 6 (six) hours as needed for severe pain. 04/07/14   Garald Balding, NP  predniSONE (DELTASONE) 20 MG tablet 3 Tabs PO Days 1-3, then 2 tabs PO Days 4-6, then 1 tab PO Day 7-9, then Half Tab PO Day 10-12 04/07/14   Garald Balding, NP   LMP 03/19/2014 Physical Exam  Constitutional: She is oriented to person, place, and time. She appears well-developed and well-nourished.  Morbidly obese  HENT:  Head: Normocephalic.  Eyes: Pupils are equal,  round, and reactive to light.  Neck: Normal range of motion.  Cardiovascular: Normal rate and regular rhythm.   Pulmonary/Chest: Effort normal.  Abdominal: Soft.  Neurological: She is alert and oriented to person, place, and time.  Skin: Skin is warm.  Nursing note and vitals reviewed.   ED Course  Procedures (including critical care time) Labs Review Labs Reviewed - No data to display  Imaging Review No results found.   EKG Interpretation None     patinet has been given pain control in ED hand written Rx for Percocet, Flexerile and Prednisone taper give due to "down time"  MDM   Final diagnoses:  Sciatica associated with disorder of lumbar spine, unspecified laterality         Garald Balding, NP 04/07/14 [place], MD 04/07/14 [phone]

## 2014-04-07 NOTE — ED Notes (Signed)
Pt's mother arrived to drive her home.

## 2014-04-07 NOTE — ED Notes (Signed)
Bed: WA23 Expected date:  Expected time:  Means of arrival:  Comments: 

## 2014-06-06 ENCOUNTER — Encounter (HOSPITAL_COMMUNITY): Payer: Self-pay | Admitting: Emergency Medicine

## 2014-06-06 ENCOUNTER — Emergency Department (HOSPITAL_COMMUNITY)
Admission: EM | Admit: 2014-06-06 | Discharge: 2014-06-06 | Disposition: A | Discharge status: Home / Self Care | Payer: Medicaid Other | Attending: Emergency Medicine | Admitting: Emergency Medicine

## 2014-06-06 DIAGNOSIS — Z8742 Personal history of other diseases of the female genital tract: Secondary | ICD-10-CM | POA: Insufficient documentation

## 2014-06-06 DIAGNOSIS — Z91040 Latex allergy status: Secondary | ICD-10-CM | POA: Diagnosis not present

## 2014-06-06 DIAGNOSIS — Z79899 Other long term (current) drug therapy: Secondary | ICD-10-CM | POA: Diagnosis not present

## 2014-06-06 DIAGNOSIS — J45909 Unspecified asthma, uncomplicated: Secondary | ICD-10-CM | POA: Insufficient documentation

## 2014-06-06 DIAGNOSIS — Z720 Tobacco use: Secondary | ICD-10-CM | POA: Insufficient documentation

## 2014-06-06 DIAGNOSIS — G43909 Migraine, unspecified, not intractable, without status migrainosus: Secondary | ICD-10-CM | POA: Diagnosis not present

## 2014-06-06 DIAGNOSIS — E669 Obesity, unspecified: Secondary | ICD-10-CM | POA: Diagnosis not present

## 2014-06-06 DIAGNOSIS — K088 Other specified disorders of teeth and supporting structures: Secondary | ICD-10-CM | POA: Diagnosis not present

## 2014-06-06 DIAGNOSIS — M419 Scoliosis, unspecified: Secondary | ICD-10-CM | POA: Diagnosis not present

## 2014-06-06 DIAGNOSIS — M1990 Unspecified osteoarthritis, unspecified site: Secondary | ICD-10-CM | POA: Diagnosis not present

## 2014-06-06 DIAGNOSIS — Z7982 Long term (current) use of aspirin: Secondary | ICD-10-CM | POA: Diagnosis not present

## 2014-06-06 DIAGNOSIS — H9201 Otalgia, right ear: Secondary | ICD-10-CM | POA: Insufficient documentation

## 2014-06-06 DIAGNOSIS — Z862 Personal history of diseases of the blood and blood-forming organs and certain disorders involving the immune mechanism: Secondary | ICD-10-CM | POA: Insufficient documentation

## 2014-06-06 DIAGNOSIS — K029 Dental caries, unspecified: Secondary | ICD-10-CM | POA: Diagnosis not present

## 2014-06-06 DIAGNOSIS — K0889 Other specified disorders of teeth and supporting structures: Secondary | ICD-10-CM

## 2014-06-06 DIAGNOSIS — Z87448 Personal history of other diseases of urinary system: Secondary | ICD-10-CM | POA: Diagnosis not present

## 2014-06-06 DIAGNOSIS — K002 Abnormalities of size and form of teeth: Secondary | ICD-10-CM | POA: Diagnosis not present

## 2014-06-06 DIAGNOSIS — F419 Anxiety disorder, unspecified: Secondary | ICD-10-CM | POA: Diagnosis not present

## 2014-06-06 LAB — POC URINE PREG, ED: Preg Test, Ur: NEGATIVE

## 2014-06-06 MED ORDER — HYDROCODONE-ACETAMINOPHEN 5-325 MG PO TABS
1.0000 | ORAL_TABLET | Freq: Once | ORAL | Status: AC
  Administered 2014-06-06: 1 via ORAL

## 2014-06-06 MED ORDER — HYDROCODONE-ACETAMINOPHEN 5-325 MG PO TABS: 1.0000 | ORAL_TABLET | ORAL | Status: DC | PRN

## 2014-06-06 NOTE — ED Notes (Signed)
Patient states that she went to the dentist on Wednesday and was told that the teeth were decaying under the filling. Was placed on ABT therapy (Amoxicillin) and scheduled for a follow up appointment next Thursday. She was informed the ABT would relieve the pain and has had no relief. Dentist does not have a 24 hour on call service.

## 2014-06-06 NOTE — ED Provider Notes (Signed)
CSN: [id number]     Arrival date & time 06/06/14  0401 History   First MD Initiated Contact with Patient 06/06/14 [phone]     Chief Complaint  Patient presents with  . Otalgia  . Dental Problem     (Consider location/radiation/quality/duration/timing/severity/associated sxs/prior Treatment) HPI  [name] is a 29 y.o. female with PMH of obesity, scoliosis, anxiety, migraines presenting with dental pain. Patient saw dentist 5 days ago performed after she had decaying teeth and feeling and she was placed on Augmentin and scheduled for follow-up in 4 days. She has used ibuprofen, Aleve without any significant relief. Patient states her pain is right lower side and radiates to her right ear. She reports compliance with amoxicillin. She denies any fever or chills, chest pain, shortness of breath, drooling or facial swelling.   Past Medical History  Diagnosis Date  . Scoliosis   . Obesity   . Migraine   . Anxiety   . Gallstones   . Asthma   . Arthritis   . Pelvic inflammatory disease (PID) 05-08-11    previous hx. .Hx. childbirth x3-NVD  . Leukocytosis     going to hematologist  . History of renal failure   . Edema   . History of anemia    Past Surgical History  Procedure Laterality Date  . Abdominal exploration surgery  approx 29 years old    rlq(due to adhesions around ovaries)-appendix and ovaries remain  . Childbirth    . Cholecystectomy  05/10/2011    Procedure: LAPAROSCOPIC CHOLECYSTECTOMY WITH INTRAOPERATIVE CHOLANGIOGRAM;  Surgeon: Gayland Curry, MD,FACS;  Location: [facility];  Service: General;  Laterality: N/A;  . Anterior cervical decomp/discectomy fusion N/A 12/09/2012    Procedure: ANTERIOR CERVICAL DECOMPRESSION/DISCECTOMY FUSION STRUCTURAL ALLOGRAFT TRESTLE PLATE CERVICAL FIVE-SIX,SIX-SEVEN;  Surgeon: Otilio Connors, MD;  Location: [place] [facility];  Service: Neurosurgery;  Laterality: N/A;   Family History  Problem Relation Age of Onset  . Prostate cancer Maternal  Grandfather   . Cancer Maternal Grandfather     prostate  . Colon polyps Maternal Grandmother   . Diabetes Mother   . Endometriosis Mother   . Cancer Mother     lung  . Diabetes Father   . Heart disease Father   . Cirrhosis Paternal Grandfather    History  Substance Use Topics  . Smoking status: Current Every Day Smoker -- 0.50 packs/day for 12 years    Types: Cigarettes  . Smokeless tobacco: Never Used     Comment: trying to quit  . Alcohol Use: Yes     Comment: wine cooler for occasion   OB History    No data available     Review of Systems  Constitutional: Negative for fever and chills.  HENT: Negative for drooling, ear discharge and facial swelling.   Respiratory: Negative for cough and shortness of breath.   Cardiovascular: Negative for chest pain and palpitations.  Gastrointestinal: Negative for nausea and vomiting.      Allergies  Banana and Latex  Home Medications   Prior to Admission medications   Medication Sig Start Date End Date Taking? Authorizing Provider  ARIPiprazole (ABILIFY) 10 MG tablet Take 10 mg by mouth daily.   Yes Historical Provider, MD  busPIRone (BUSPAR) 15 MG tablet Take 15 mg by mouth 2 (two) times daily.   Yes Historical Provider, MD  docusate sodium (COLACE) 100 MG capsule Take 100 mg by mouth at bedtime.   Yes Historical Provider, MD  esomeprazole (NEXIUM) 40 MG  capsule Take 40 mg by mouth daily at 12 noon.   Yes Historical Provider, MD  FLUoxetine (PROZAC) 20 MG capsule Take 60 mg by mouth daily.   Yes Historical Provider, MD  Iron TABS Take 1 tablet by mouth daily.   Yes Historical Provider, MD  Multiple Vitamin (MULTIVITAMIN WITH MINERALS) TABS tablet Take 1 tablet by mouth daily.   Yes Historical Provider, MD  nabumetone (RELAFEN) 750 MG tablet Take 750 mg by mouth 2 (two) times daily.   Yes Historical Provider, MD  pregabalin (LYRICA) 150 MG capsule Take 150 mg by mouth 2 (two) times daily.   Yes Historical Provider, MD   traZODone (DESYREL) 50 MG tablet Take 50 mg by mouth at bedtime.   Yes Historical Provider, MD  zolpidem (AMBIEN) 10 MG tablet Take 10 mg by mouth at bedtime as needed for sleep.   Yes Historical Provider, MD  cyclobenzaprine (FLEXERIL) 10 MG tablet Take 1 tablet (10 mg total) by mouth 3 (three) times daily as needed for muscle spasms. Patient not taking: Reported on 06/06/2014 04/07/14   Junius Creamer, NP  HYDROcodone-acetaminophen (NORCO/VICODIN) 5-325 MG per tablet Take 1-2 tablets by mouth every 4 (four) hours as needed. 06/06/14   Al Corpus, PA-C  methocarbamol (ROBAXIN-750) 750 MG tablet Take 1 tablet (750 mg total) by mouth every 8 (eight) hours as needed for muscle spasms. Patient not taking: Reported on 06/06/2014 03/27/14   Linton Flemings, MD  naproxen (NAPROSYN) 500 MG tablet Take 1 tablet (500 mg total) by mouth 2 (two) times daily. Patient not taking: Reported on 06/06/2014 03/27/14   Linton Flemings, MD  oxyCODONE-acetaminophen (PERCOCET) 5-325 MG per tablet Take 1 tablet by mouth every 6 (six) hours as needed for severe pain. Patient not taking: Reported on 06/06/2014 04/07/14   Junius Creamer, NP  predniSONE (DELTASONE) 20 MG tablet 3 Tabs PO Days 1-3, then 2 tabs PO Days 4-6, then 1 tab PO Day 7-9, then Half Tab PO Day 10-12 Patient not taking: Reported on 06/06/2014 04/07/14   Junius Creamer, NP   BP 118/78 mmHg  Pulse 77  Temp(Src) 98.6 F (37 C) (Oral)  Resp 18  Ht 5\' 3"  (1.6 m)  Wt 230 lb (104.327 kg)  BMI 40.75 kg/m2  SpO2 99%  LMP 04/18/2014 (Approximate) Physical Exam  Constitutional: She appears well-developed and well-nourished. No distress.  HENT:  Head: Normocephalic and atraumatic.  Right Ear: External ear normal. Tympanic membrane is not perforated, not erythematous, not retracted and not bulging.  Left Ear: External ear normal. Tympanic membrane is not perforated, not erythematous, not retracted and not bulging.  Mouth/Throat: Oropharynx is clear and moist. No oropharyngeal  exudate.  No trismus or uvula deviation No lip, tongue, facial swelling. No swelling under tongue or tenderness. Patient handling secretions. No drooling. Patient with poor dentition with caries  Patient with tenderness to right back molar with mild erythema in this gingiva but no drainable abscess.   Eyes: Conjunctivae are normal. Right eye exhibits no discharge. Left eye exhibits no discharge.  Neck: Normal range of motion. Neck supple.  No neck masses or tenderness.  Cardiovascular: Normal rate and regular rhythm.   Pulmonary/Chest: Effort normal and breath sounds normal. No respiratory distress. She has no wheezes.  Abdominal: Soft. She exhibits no distension. There is no tenderness.  Lymphadenopathy:    She has no cervical adenopathy.  Neurological: She is alert. Coordination normal.  Skin: Skin is warm and dry. She is not diaphoretic.  Nursing note and  vitals reviewed.   ED Course  Procedures (including critical care time) Labs Review Labs Reviewed  POC URINE PREG, ED    Imaging Review No results found.   EKG Interpretation None      MDM   Final diagnoses:  Pain, dental   Patient with toothache.  No gross abscess.  Currently on ABX, amoxicillin. No history of diabetes. Exam unconcerning for Ludwig's angina or spread of infection. No AOM. Ear pain likely related to dental pain.  Pt given short script for norco.  Driving and sedation precautions provided. Pt to follow up with dentist in 4 days as scheduled.  Discussed return precautions with patient. Discussed all results and patient verbalizes understanding and agrees with plan.   Al Corpus, PA-C 06/06/14 1694  Julianne Rice, MD 06/06/14 0800

## 2014-06-06 NOTE — ED Notes (Addendum)
Patient with right ear pain.  Patient states she has dental problems on that side of her jaw and has caused some of the pain.  She has tried many different OTC remedies, no relief.  Patient also states that it has been over two months since she had a period and could be pregnant.

## 2014-06-06 NOTE — Discharge Instructions (Signed)
Return to the emergency room with worsening of symptoms, new symptoms or with symptoms that are concerning, especially fevers, facial swelling, unable to open mouth, drooling, shortness of breath, chest pain. Continue to take your antibiotic. Norco for severe pain. No drinking, driving, operating machinery, taking other sedating meds while taking Norco. Follow up with your dentist on Thursday as scheduled. Read below information and follow recommendations.  Dental Pain A tooth ache may be caused by cavities (tooth decay). Cavities expose the nerve of the tooth to air and hot or cold temperatures. It may come from an infection or abscess (also called a boil or furuncle) around your tooth. It is also often caused by dental caries (tooth decay). This causes the pain you are having. DIAGNOSIS  Your caregiver can diagnose this problem by exam. TREATMENT   If caused by an infection, it may be treated with medications which kill germs (antibiotics) and pain medications as prescribed by your caregiver. Take medications as directed.  Only take over-the-counter or prescription medicines for pain, discomfort, or fever as directed by your caregiver.  Whether the tooth ache today is caused by infection or dental disease, you should see your dentist as soon as possible for further care. SEEK MEDICAL CARE IF: The exam and treatment you received today has been provided on an emergency basis only. This is not a substitute for complete medical or dental care. If your problem worsens or new problems (symptoms) appear, and you are unable to meet with your dentist, call or return to this location. SEEK IMMEDIATE MEDICAL CARE IF:   You have a fever.  You develop redness and swelling of your face, jaw, or neck.  You are unable to open your mouth.  You have severe pain uncontrolled by pain medicine. MAKE SURE YOU:   Understand these instructions.  Will watch your condition.  Will get help right away if you  are not doing well or get worse. Document Released: 03/12/2005 Document Revised: 06/04/2011 Document Reviewed: 10/29/2007 [facility] Patient Information 2015 [place], [place]. This information is not intended to replace advice given to you by your health care provider. Make sure you discuss any questions you have with your health care provider.

## 2014-06-13 ENCOUNTER — Emergency Department (HOSPITAL_COMMUNITY)
Admission: EM | Admit: 2014-06-13 | Discharge: 2014-06-13 | Disposition: A | Discharge status: Home / Self Care | Payer: Medicaid Other | Attending: Emergency Medicine | Admitting: Emergency Medicine

## 2014-06-13 ENCOUNTER — Encounter (HOSPITAL_COMMUNITY): Payer: Self-pay | Admitting: Emergency Medicine

## 2014-06-13 DIAGNOSIS — F419 Anxiety disorder, unspecified: Secondary | ICD-10-CM | POA: Diagnosis not present

## 2014-06-13 DIAGNOSIS — Z79899 Other long term (current) drug therapy: Secondary | ICD-10-CM | POA: Diagnosis not present

## 2014-06-13 DIAGNOSIS — Z791 Long term (current) use of non-steroidal anti-inflammatories (NSAID): Secondary | ICD-10-CM | POA: Diagnosis not present

## 2014-06-13 DIAGNOSIS — Z8719 Personal history of other diseases of the digestive system: Secondary | ICD-10-CM | POA: Insufficient documentation

## 2014-06-13 DIAGNOSIS — J45909 Unspecified asthma, uncomplicated: Secondary | ICD-10-CM | POA: Insufficient documentation

## 2014-06-13 DIAGNOSIS — E669 Obesity, unspecified: Secondary | ICD-10-CM | POA: Insufficient documentation

## 2014-06-13 DIAGNOSIS — Z7952 Long term (current) use of systemic steroids: Secondary | ICD-10-CM | POA: Diagnosis not present

## 2014-06-13 DIAGNOSIS — Z3202 Encounter for pregnancy test, result negative: Secondary | ICD-10-CM | POA: Diagnosis not present

## 2014-06-13 DIAGNOSIS — Z862 Personal history of diseases of the blood and blood-forming organs and certain disorders involving the immune mechanism: Secondary | ICD-10-CM | POA: Diagnosis not present

## 2014-06-13 DIAGNOSIS — Z91040 Latex allergy status: Secondary | ICD-10-CM | POA: Diagnosis not present

## 2014-06-13 DIAGNOSIS — M5432 Sciatica, left side: Secondary | ICD-10-CM | POA: Insufficient documentation

## 2014-06-13 DIAGNOSIS — Z720 Tobacco use: Secondary | ICD-10-CM | POA: Diagnosis not present

## 2014-06-13 DIAGNOSIS — N939 Abnormal uterine and vaginal bleeding, unspecified: Secondary | ICD-10-CM

## 2014-06-13 DIAGNOSIS — G894 Chronic pain syndrome: Secondary | ICD-10-CM | POA: Diagnosis not present

## 2014-06-13 LAB — CBC WITH DIFFERENTIAL/PLATELET
BASOS ABS: 0 10*3/uL (ref 0.0–0.1)
Basophils Relative: 0 % (ref 0–1)
Eosinophils Absolute: 0.1 10*3/uL (ref 0.0–0.7)
Eosinophils Relative: 1 % (ref 0–5)
HEMATOCRIT: 37.7 % (ref 36.0–46.0)
Hemoglobin: 12.5 g/dL (ref 12.0–15.0)
LYMPHS PCT: 27 % (ref 12–46)
Lymphs Abs: 3.5 10*3/uL (ref 0.7–4.0)
MCH: 26 pg (ref 26.0–34.0)
MCHC: 33.2 g/dL (ref 30.0–36.0)
MCV: 78.5 fL (ref 78.0–100.0)
MONO ABS: 0.8 10*3/uL (ref 0.1–1.0)
Monocytes Relative: 6 % (ref 3–12)
NEUTROS ABS: 8.4 10*3/uL — AB (ref 1.7–7.7)
Neutrophils Relative %: 66 % (ref 43–77)
PLATELETS: 376 10*3/uL (ref 150–400)
RBC: 4.8 MIL/uL (ref 3.87–5.11)
RDW: 17.3 % — AB (ref 11.5–15.5)
WBC: 12.8 10*3/uL — ABNORMAL HIGH (ref 4.0–10.5)

## 2014-06-13 LAB — COMPREHENSIVE METABOLIC PANEL
ALBUMIN: 3.8 g/dL (ref 3.5–5.2)
ALT: 22 U/L (ref 0–35)
AST: 27 U/L (ref 0–37)
Alkaline Phosphatase: 80 U/L (ref 39–117)
Anion gap: 8 (ref 5–15)
BILIRUBIN TOTAL: 0.4 mg/dL (ref 0.3–1.2)
BUN: 10 mg/dL (ref 6–23)
CHLORIDE: 103 mmol/L (ref 96–112)
CO2: 25 mmol/L (ref 19–32)
Calcium: 9.2 mg/dL (ref 8.4–10.5)
Creatinine, Ser: 0.99 mg/dL (ref 0.50–1.10)
GFR calc Af Amer: 88 mL/min — ABNORMAL LOW (ref >90)
GFR calc non Af Amer: 76 mL/min — ABNORMAL LOW (ref >90)
GLUCOSE: 81 mg/dL (ref 70–99)
Potassium: 3.3 mmol/L — ABNORMAL LOW (ref 3.5–5.1)
Sodium: 136 mmol/L (ref 135–145)
Total Protein: 8 g/dL (ref 6.0–8.3)

## 2014-06-13 LAB — POC URINE PREG, ED: Preg Test, Ur: NEGATIVE

## 2014-06-13 LAB — URINALYSIS, ROUTINE W REFLEX MICROSCOPIC
Glucose, UA: NEGATIVE mg/dL
HGB URINE DIPSTICK: NEGATIVE
KETONES UR: 15 mg/dL — AB
Leukocytes, UA: NEGATIVE
Nitrite: NEGATIVE
Protein, ur: 30 mg/dL — AB
Specific Gravity, Urine: 1.04 — ABNORMAL HIGH (ref 1.005–1.030)
Urobilinogen, UA: 1 mg/dL (ref 0.0–1.0)
pH: 6 (ref 5.0–8.0)

## 2014-06-13 LAB — URINE MICROSCOPIC-ADD ON

## 2014-06-13 LAB — I-STAT BETA HCG BLOOD, ED (MC, WL, AP ONLY)

## 2014-06-13 LAB — WET PREP, GENITAL
Clue Cells Wet Prep HPF POC: NONE SEEN
Trich, Wet Prep: NONE SEEN
YEAST WET PREP: NONE SEEN

## 2014-06-13 MED ORDER — OXYCODONE-ACETAMINOPHEN 5-325 MG PO TABS
2.0000 | ORAL_TABLET | Freq: Once | ORAL | Status: AC
  Administered 2014-06-13: 2 via ORAL
  Filled 2014-06-13: qty 2

## 2014-06-13 MED ORDER — CYCLOBENZAPRINE HCL 10 MG PO TABS: 10.0000 mg | ORAL_TABLET | Freq: Two times a day (BID) | ORAL | Status: DC | PRN

## 2014-06-13 NOTE — ED Provider Notes (Signed)
CSN: [id number]     Arrival date & time 06/13/14  1605 History   First MD Initiated Contact with Patient 06/13/14 1818     Chief Complaint  Patient presents with  . Vaginal Bleeding     (Consider location/radiation/quality/duration/timing/severity/associated sxs/prior Treatment) HPI Comments: Patient presents to the emergency department with chief complaint of vaginal bleeding and abdominal cramps, and sciatica. Patient states that she has not had her menstrual cycle for the past 2 and half months. She states that she is normally regular. She states that she has had very heavy vaginal bleeding today. She reports using approximately 12 tampons and 5 bands within the past 24 hours. She denies any dizziness or shortness of breath. She does complain of some lower abdominal cramping, which she states feels like menstrual cramps. She also complains of left sided sciatica. She states that she is mostly concerned about the pain and the compounding effect of having both the sciatica and the abdominal cramps at the same time. She states that she is scheduled to see her regular doctor on Tuesday. She states that she has run out of her medications for pain control.  The history is provided by the patient and a significant other. No language interpreter was used.    Past Medical History  Diagnosis Date  . Scoliosis   . Obesity   . Migraine   . Anxiety   . Gallstones   . Asthma   . Arthritis   . Pelvic inflammatory disease (PID) 05-08-11    previous hx. .Hx. childbirth x3-NVD  . Leukocytosis     going to hematologist  . History of renal failure   . Edema   . History of anemia    Past Surgical History  Procedure Laterality Date  . Abdominal exploration surgery  approx 29 years old    rlq(due to adhesions around ovaries)-appendix and ovaries remain  . Childbirth    . Cholecystectomy  05/10/2011    Procedure: LAPAROSCOPIC CHOLECYSTECTOMY WITH INTRAOPERATIVE CHOLANGIOGRAM;  Surgeon: Gayland Curry,  MD,FACS;  Location: [facility];  Service: General;  Laterality: N/A;  . Anterior cervical decomp/discectomy fusion N/A 12/09/2012    Procedure: ANTERIOR CERVICAL DECOMPRESSION/DISCECTOMY FUSION STRUCTURAL ALLOGRAFT TRESTLE PLATE CERVICAL FIVE-SIX,SIX-SEVEN;  Surgeon: Otilio Connors, MD;  Location: [place] [facility];  Service: Neurosurgery;  Laterality: N/A;   Family History  Problem Relation Age of Onset  . Prostate cancer Maternal Grandfather   . Cancer Maternal Grandfather     prostate  . Colon polyps Maternal Grandmother   . Diabetes Mother   . Endometriosis Mother   . Cancer Mother     lung  . Diabetes Father   . Heart disease Father   . Cirrhosis Paternal Grandfather    History  Substance Use Topics  . Smoking status: Current Every Day Smoker -- 0.50 packs/day for 12 years    Types: Cigarettes  . Smokeless tobacco: Never Used     Comment: trying to quit  . Alcohol Use: Yes     Comment: wine cooler for occasion   OB History    No data available     Review of Systems  Constitutional: Negative for fever and chills.  Respiratory: Negative for shortness of breath.   Cardiovascular: Negative for chest pain.  Gastrointestinal: Positive for abdominal pain. Negative for nausea, vomiting, diarrhea and constipation.  Genitourinary: Positive for vaginal bleeding. Negative for dysuria.  All other systems reviewed and are negative.     Allergies  Banana and Latex  Home Medications   Prior to Admission medications   Medication Sig Start Date End Date Taking? Authorizing Provider  albuterol (PROVENTIL HFA;VENTOLIN HFA) 108 (90 BASE) MCG/ACT inhaler Inhale 2 puffs into the lungs every 4 (four) hours as needed for wheezing or shortness of breath. ProAir   Yes Historical Provider, MD  ALPRAZolam Duanne Moron) 0.5 MG tablet Take 0.5 mg by mouth 3 (three) times daily.   Yes Historical Provider, MD  ARIPiprazole (ABILIFY) 10 MG tablet Take 10 mg by mouth daily.   Yes Historical Provider, MD   Aspirin-Acetaminophen-Caffeine (GOODY HEADACHE PO) Take 1 packet by mouth 3 (three) times daily as needed (pain).   Yes Historical Provider, MD  busPIRone (BUSPAR) 15 MG tablet Take 15 mg by mouth 2 (two) times daily.   Yes Historical Provider, MD  docusate sodium (COLACE) 100 MG capsule Take 100 mg by mouth at bedtime as needed (constipation).    Yes Historical Provider, MD  esomeprazole (NEXIUM) 40 MG capsule Take 40 mg by mouth daily.    Yes Historical Provider, MD  ferrous sulfate 325 (65 FE) MG tablet Take 325 mg by mouth daily with breakfast.   Yes Historical Provider, MD  FLUoxetine (PROZAC) 20 MG capsule Take 60 mg by mouth daily.   Yes Historical Provider, MD  ibuprofen (ADVIL,MOTRIN) 200 MG tablet Take 400 mg by mouth every 6 (six) hours as needed (pain).   Yes Historical Provider, MD  Multiple Vitamin (MULTIVITAMIN WITH MINERALS) TABS tablet Take 1 tablet by mouth daily.   Yes Historical Provider, MD  nabumetone (RELAFEN) 750 MG tablet Take 750 mg by mouth 2 (two) times daily.   Yes Historical Provider, MD  pregabalin (LYRICA) 150 MG capsule Take 150 mg by mouth 2 (two) times daily.   Yes Historical Provider, MD  traZODone (DESYREL) 50 MG tablet Take 50 mg by mouth at bedtime.   Yes Historical Provider, MD  trolamine salicylate (ASPERCREME) 10 % cream Apply 1 application topically 2 (two) times daily as needed for muscle pain.   Yes Historical Provider, MD  zolpidem (AMBIEN) 10 MG tablet Take 10 mg by mouth at bedtime as needed for sleep.   Yes Historical Provider, MD  cyclobenzaprine (FLEXERIL) 10 MG tablet Take 1 tablet (10 mg total) by mouth 3 (three) times daily as needed for muscle spasms. Patient not taking: Reported on 06/06/2014 04/07/14   Junius Creamer, NP  HYDROcodone-acetaminophen (NORCO/VICODIN) 5-325 MG per tablet Take 1-2 tablets by mouth every 4 (four) hours as needed. Patient not taking: Reported on 06/13/2014 06/06/14   Al Corpus, PA-C  methocarbamol (ROBAXIN-750) 750  MG tablet Take 1 tablet (750 mg total) by mouth every 8 (eight) hours as needed for muscle spasms. Patient not taking: Reported on 06/06/2014 03/27/14   Linton Flemings, MD  naproxen (NAPROSYN) 500 MG tablet Take 1 tablet (500 mg total) by mouth 2 (two) times daily. Patient not taking: Reported on 06/06/2014 03/27/14   Linton Flemings, MD  oxyCODONE-acetaminophen (PERCOCET) 5-325 MG per tablet Take 1 tablet by mouth every 6 (six) hours as needed for severe pain. Patient not taking: Reported on 06/06/2014 04/07/14   Junius Creamer, NP  predniSONE (DELTASONE) 20 MG tablet 3 Tabs PO Days 1-3, then 2 tabs PO Days 4-6, then 1 tab PO Day 7-9, then Half Tab PO Day 10-12 Patient not taking: Reported on 06/06/2014 04/07/14   Junius Creamer, NP   BP 111/74 mmHg  Pulse 100  Temp(Src) 97.8 F (36.6 C) (Oral)  Resp 18  Ht  5\' 3"  (1.6 m)  Wt 266 lb (120.657 kg)  BMI 47.13 kg/m2  SpO2 98%  LMP 04/18/2014 (Approximate) Physical Exam  Constitutional: She is oriented to person, place, and time. She appears well-developed and well-nourished.  HENT:  Head: Normocephalic and atraumatic.  Eyes: Conjunctivae and EOM are normal. Pupils are equal, round, and reactive to light.  Neck: Normal range of motion. Neck supple.  Cardiovascular: Normal rate and regular rhythm.  Exam reveals no gallop and no friction rub.   No murmur heard. Pulmonary/Chest: Effort normal and breath sounds normal. No respiratory distress. She has no wheezes. She has no rales. She exhibits no tenderness.  Abdominal: Soft. Bowel sounds are normal. She exhibits no distension and no mass. There is no tenderness. There is no rebound and no guarding.  Left lower quadrant moderately tender to palpation, some diffuse lower abdominal tenderness, no rebound or guarding  Genitourinary:  Pelvic exam chaperoned by female ER tech, no right or left adnexal tenderness, no uterine tenderness, no vaginal discharge, mild old blood in vaginal vault, no active bleeding, no CMT or  friability, no foreign body, no injury to the external genitalia, no other significant findings   Musculoskeletal: Normal range of motion. She exhibits no edema or tenderness.  Neurological: She is alert and oriented to person, place, and time.  Skin: Skin is warm and dry.  Psychiatric: She has a normal mood and affect. Her behavior is normal. Judgment and thought content normal.  Nursing note and vitals reviewed.   ED Course  Procedures (including critical care time) Results for orders placed or performed during the hospital encounter of 06/13/14  CBC with Differential  Result Value Ref Range   WBC 12.8 (H) 4.0 - 10.5 K/uL   RBC 4.80 3.87 - 5.11 MIL/uL   Hemoglobin 12.5 12.0 - 15.0 g/dL   HCT 37.7 36.0 - 46.0 %   MCV 78.5 78.0 - 100.0 fL   MCH 26.0 26.0 - 34.0 pg   MCHC 33.2 30.0 - 36.0 g/dL   RDW 17.3 (H) 11.5 - 15.5 %   Platelets 376 150 - 400 K/uL   Neutrophils Relative % 66 43 - 77 %   Neutro Abs 8.4 (H) 1.7 - 7.7 K/uL   Lymphocytes Relative 27 12 - 46 %   Lymphs Abs 3.5 0.7 - 4.0 K/uL   Monocytes Relative 6 3 - 12 %   Monocytes Absolute 0.8 0.1 - 1.0 K/uL   Eosinophils Relative 1 0 - 5 %   Eosinophils Absolute 0.1 0.0 - 0.7 K/uL   Basophils Relative 0 0 - 1 %   Basophils Absolute 0.0 0.0 - 0.1 K/uL  Comprehensive metabolic panel  Result Value Ref Range   Sodium 136 135 - 145 mmol/L   Potassium 3.3 (L) 3.5 - 5.1 mmol/L   Chloride 103 96 - 112 mmol/L   CO2 25 19 - 32 mmol/L   Glucose, Bld 81 70 - 99 mg/dL   BUN 10 6 - 23 mg/dL   Creatinine, Ser 0.99 0.50 - 1.10 mg/dL   Calcium 9.2 8.4 - 10.5 mg/dL   Total Protein 8.0 6.0 - 8.3 g/dL   Albumin 3.8 3.5 - 5.2 g/dL   AST 27 0 - 37 U/L   ALT 22 0 - 35 U/L   Alkaline Phosphatase 80 39 - 117 U/L   Total Bilirubin 0.4 0.3 - 1.2 mg/dL   GFR calc non Af Amer 76 (L) >90 mL/min   GFR calc Af Amer 88 (  L) >90 mL/min   Anion gap 8 5 - 15  POC Urine Pregnancy, ED  (If Pre-menopausal female) - do not order at [facility]  Result Value  Ref Range   Preg Test, Ur NEGATIVE NEGATIVE  I-Stat Beta hCG blood, ED ([facility], [facility], [facility] only)  Result Value Ref Range   I-stat hCG, quantitative <5.0 <5 mIU/mL   Comment 3           No results found.   Imaging Review No results found.   EKG Interpretation None      MDM   Final diagnoses:  Vaginal bleeding  Sciatica, left  Chronic pain syndrome    Patient with vaginal bleeding. Will check urine pregnancy/hCG. Will check pelvic exam, consider pelvic ultrasound given left lower quadrant tenderness. Regarding the back pain and sciatica patient will need to follow-up with her primary care provider. Will give her some pain medicine in the emergency department.  Patient states that the Percocet significantly helped her sciatica pain. Pelvic exam was remarkable for some old blood in the vaginal vault, but no active hemorrhage, no tenderness to palpation of the ovaries, uterus, or cervix. Patient's vital signs are stable. Hemoglobin is stable. Will recommend outpatient follow-up. Patient understands and agrees to plan. Advised patient that I could not treat chronic pain medicine emergency department, but have recommended outpatient physical therapy and have provided the patient with the resource guide. Patient is stable and ready for discharge.    Montine Circle, PA-C 06/13/14 2126  Tanna Furry, MD 06/16/14 0700

## 2014-06-13 NOTE — ED Notes (Signed)
Patient placed in room.

## 2014-06-13 NOTE — ED Notes (Signed)
Pt reports that she is having heavy vaginal bleeding onset last night at 1900. Pt reports using a 12 pack of tampons and 5 pads within 24 hours. Pt reports that her period is 60 days late. Pt also has sciatica and when she gets the cramps from the bleeding its becomes worse.

## 2014-06-13 NOTE — Discharge Instructions (Signed)
Abnormal Uterine Bleeding Abnormal uterine bleeding can affect women at various stages in life, including teenagers, women in their reproductive years, pregnant women, and women who have reached menopause. Several kinds of uterine bleeding are considered abnormal, including:  Bleeding or spotting between periods.   Bleeding after sexual intercourse.   Bleeding that is heavier or more than normal.   Periods that last longer than usual.  Bleeding after menopause.  Many cases of abnormal uterine bleeding are minor and simple to treat, while others are more serious. Any type of abnormal bleeding should be evaluated by your health care provider. Treatment will depend on the cause of the bleeding. HOME CARE INSTRUCTIONS Monitor your condition for any changes. The following actions may help to alleviate any discomfort you are experiencing:  Avoid the use of tampons and douches as directed by your health care provider.  Change your pads frequently. You should get regular pelvic exams and Pap tests. Keep all follow-up appointments for diagnostic tests as directed by your health care provider.  SEEK MEDICAL CARE IF:   Your bleeding lasts more than 1 week.   You feel dizzy at times.  SEEK IMMEDIATE MEDICAL CARE IF:   You pass out.   You are changing pads every 15 to 30 minutes.   You have abdominal pain.  You have a fever.   You become sweaty or weak.   You are passing large blood clots from the vagina.   You start to feel nauseous and vomit. MAKE SURE YOU:   Understand these instructions.  Will watch your condition.  Will get help right away if you are not doing well or get worse. Document Released: 03/12/2005 Document Revised: 03/17/2013 Document Reviewed: 10/09/2012 [facility] Patient Information 2015 [place], [place]. This information is not intended to replace advice given to you by your health care provider. Make sure you discuss any questions you have with your  health care provider. Sciatica Sciatica is pain, weakness, numbness, or tingling along the path of the sciatic nerve. The nerve starts in the lower back and runs down the back of each leg. The nerve controls the muscles in the lower leg and in the back of the knee, while also providing sensation to the back of the thigh, lower leg, and the sole of your foot. Sciatica is a symptom of another medical condition. For instance, nerve damage or certain conditions, such as a herniated disk or bone spur on the spine, pinch or put pressure on the sciatic nerve. This causes the pain, weakness, or other sensations normally associated with sciatica. Generally, sciatica only affects one side of the body. CAUSES   Herniated or slipped disc.  Degenerative disk disease.  A pain disorder involving the narrow muscle in the buttocks (piriformis syndrome).  Pelvic injury or fracture.  Pregnancy.  Tumor (rare). SYMPTOMS  Symptoms can vary from mild to very severe. The symptoms usually travel from the low back to the buttocks and down the back of the leg. Symptoms can include:  Mild tingling or dull aches in the lower back, leg, or hip.  Numbness in the back of the calf or sole of the foot.  Burning sensations in the lower back, leg, or hip.  Sharp pains in the lower back, leg, or hip.  Leg weakness.  Severe back pain inhibiting movement. These symptoms may get worse with coughing, sneezing, laughing, or prolonged sitting or standing. Also, being overweight may worsen symptoms. DIAGNOSIS  Your caregiver will perform a physical exam to look  for common symptoms of sciatica. He or she may ask you to do certain movements or activities that would trigger sciatic nerve pain. Other tests may be performed to find the cause of the sciatica. These may include:  Blood tests.  X-rays.  Imaging tests, such as an MRI or CT scan. TREATMENT  Treatment is directed at the cause of the sciatic pain. Sometimes,  treatment is not necessary and the pain and discomfort goes away on its own. If treatment is needed, your caregiver may suggest:  Over-the-counter medicines to relieve pain.  Prescription medicines, such as anti-inflammatory medicine, muscle relaxants, or narcotics.  Applying heat or ice to the painful area.  Steroid injections to lessen pain, irritation, and inflammation around the nerve.  Reducing activity during periods of pain.  Exercising and stretching to strengthen your abdomen and improve flexibility of your spine. Your caregiver may suggest losing weight if the extra weight makes the back pain worse.  Physical therapy.  Surgery to eliminate what is pressing or pinching the nerve, such as a bone spur or part of a herniated disk. HOME CARE INSTRUCTIONS   Only take over-the-counter or prescription medicines for pain or discomfort as directed by your caregiver.  Apply ice to the affected area for 20 minutes, 3-4 times a day for the first 48-72 hours. Then try heat in the same way.  Exercise, stretch, or perform your usual activities if these do not aggravate your pain.  Attend physical therapy sessions as directed by your caregiver.  Keep all follow-up appointments as directed by your caregiver.  Do not wear high heels or shoes that do not provide proper support.  Check your mattress to see if it is too soft. A firm mattress may lessen your pain and discomfort. SEEK IMMEDIATE MEDICAL CARE IF:   You lose control of your bowel or bladder (incontinence).  You have increasing weakness in the lower back, pelvis, buttocks, or legs.  You have redness or swelling of your back.  You have a burning sensation when you urinate.  You have pain that gets worse when you lie down or awakens you at night.  Your pain is worse than you have experienced in the past.  Your pain is lasting longer than 4 weeks.  You are suddenly losing weight without reason. MAKE SURE  YOU:  Understand these instructions.  Will watch your condition.  Will get help right away if you are not doing well or get worse. Document Released: 03/06/2001 Document Revised: 09/11/2011 Document Reviewed: 07/22/2011 [facility] Patient Information 2015 [place], [place]. This information is not intended to replace advice given to you by your health care provider. Make sure you discuss any questions you have with your health care provider.

## 2014-06-14 LAB — GC/CHLAMYDIA PROBE AMP (~~LOC~~) NOT AT ARMC
Chlamydia: NEGATIVE
Neisseria Gonorrhea: NEGATIVE

## 2014-07-29 ENCOUNTER — Encounter (HOSPITAL_COMMUNITY): Payer: Self-pay | Admitting: Emergency Medicine

## 2014-07-29 ENCOUNTER — Emergency Department (HOSPITAL_COMMUNITY)
Admission: EM | Admit: 2014-07-29 | Discharge: 2014-07-29 | Disposition: A | Discharge status: Home / Self Care | Payer: Medicaid Other | Attending: Emergency Medicine | Admitting: Emergency Medicine

## 2014-07-29 DIAGNOSIS — Z8742 Personal history of other diseases of the female genital tract: Secondary | ICD-10-CM | POA: Insufficient documentation

## 2014-07-29 DIAGNOSIS — J45909 Unspecified asthma, uncomplicated: Secondary | ICD-10-CM | POA: Diagnosis not present

## 2014-07-29 DIAGNOSIS — Y999 Unspecified external cause status: Secondary | ICD-10-CM | POA: Diagnosis not present

## 2014-07-29 DIAGNOSIS — G8929 Other chronic pain: Secondary | ICD-10-CM | POA: Diagnosis not present

## 2014-07-29 DIAGNOSIS — W1839XA Other fall on same level, initial encounter: Secondary | ICD-10-CM | POA: Diagnosis not present

## 2014-07-29 DIAGNOSIS — G43909 Migraine, unspecified, not intractable, without status migrainosus: Secondary | ICD-10-CM | POA: Insufficient documentation

## 2014-07-29 DIAGNOSIS — M1990 Unspecified osteoarthritis, unspecified site: Secondary | ICD-10-CM | POA: Diagnosis not present

## 2014-07-29 DIAGNOSIS — D649 Anemia, unspecified: Secondary | ICD-10-CM | POA: Diagnosis not present

## 2014-07-29 DIAGNOSIS — Z720 Tobacco use: Secondary | ICD-10-CM | POA: Diagnosis not present

## 2014-07-29 DIAGNOSIS — W19XXXA Unspecified fall, initial encounter: Secondary | ICD-10-CM

## 2014-07-29 DIAGNOSIS — S3992XA Unspecified injury of lower back, initial encounter: Secondary | ICD-10-CM | POA: Diagnosis not present

## 2014-07-29 DIAGNOSIS — Z8719 Personal history of other diseases of the digestive system: Secondary | ICD-10-CM | POA: Insufficient documentation

## 2014-07-29 DIAGNOSIS — Z87448 Personal history of other diseases of urinary system: Secondary | ICD-10-CM | POA: Insufficient documentation

## 2014-07-29 DIAGNOSIS — M419 Scoliosis, unspecified: Secondary | ICD-10-CM | POA: Insufficient documentation

## 2014-07-29 DIAGNOSIS — Z91040 Latex allergy status: Secondary | ICD-10-CM | POA: Diagnosis not present

## 2014-07-29 DIAGNOSIS — Y92009 Unspecified place in unspecified non-institutional (private) residence as the place of occurrence of the external cause: Secondary | ICD-10-CM | POA: Insufficient documentation

## 2014-07-29 DIAGNOSIS — Y939 Activity, unspecified: Secondary | ICD-10-CM | POA: Diagnosis not present

## 2014-07-29 DIAGNOSIS — F419 Anxiety disorder, unspecified: Secondary | ICD-10-CM | POA: Diagnosis not present

## 2014-07-29 DIAGNOSIS — M549 Dorsalgia, unspecified: Secondary | ICD-10-CM

## 2014-07-29 DIAGNOSIS — Z79899 Other long term (current) drug therapy: Secondary | ICD-10-CM | POA: Diagnosis not present

## 2014-07-29 MED ORDER — ONDANSETRON 4 MG PO TBDP
4.0000 mg | ORAL_TABLET | Freq: Once | ORAL | Status: AC
  Administered 2014-07-29: 4 mg via ORAL
  Filled 2014-07-29: qty 1

## 2014-07-29 MED ORDER — HYDROMORPHONE HCL 1 MG/ML IJ SOLN
2.0000 mg | Freq: Once | INTRAMUSCULAR | Status: AC
  Administered 2014-07-29: 2 mg via INTRAMUSCULAR
  Filled 2014-07-29: qty 2

## 2014-07-29 NOTE — ED Notes (Signed)
Pt. missed her step / lost balance and fell at home this evening , no LOC , reports pain at lower back and left leg pain . Pain increases with movement / changing positions .

## 2014-07-29 NOTE — ED Provider Notes (Signed)
CSN: [id number]     Arrival date & time 07/29/14  2110 History  This chart was scribed for non-physician practitioner, Domenic Moras, PA-C, working with Serita Grit, MD, by Jeanell Sparrow, ED Scribe. This patient was seen in room TR11C/TR11C and the patient's care was started at 9:35 PM.   Chief Complaint  Patient presents with  . Fall   The history is provided by the patient and a parent. No language interpreter was used.   HPI Comments: [name] is a 29 y.o. female who presents to the Emergency Department complaining of a fall that occurred today. She states that she has a hx of chronic back pain and back spasms, but has recently worsened following her pregnancy due to numerous falls. She states that her legs give out as she has numbs on calves and toes. She reports that she cannot stand upright due to pain and a "pulling sensation at her buttock". She reports that she was seen by PCP 4 weeks ago and dx with sciatica, but feels she needs further treatment. She denies any fever. Denies bowel/bladder incontinence or saddle anesthesia.  No hx of active cancer or IVDU.  Has been seen and evaluated for similar complaints many times in the past.  Pt last seen neurosurgeon Dr. Ellene Route over a year ago, but does not want to see him again.  She request a different Chief of Staff.  Past Medical History  Diagnosis Date  . Scoliosis   . Obesity   . Migraine   . Anxiety   . Gallstones   . Asthma   . Arthritis   . Pelvic inflammatory disease (PID) 05-08-11    previous hx. .Hx. childbirth x3-NVD  . Leukocytosis     going to hematologist  . History of renal failure   . Edema   . History of anemia    Past Surgical History  Procedure Laterality Date  . Abdominal exploration surgery  approx 29 years old    rlq(due to adhesions around ovaries)-appendix and ovaries remain  . Childbirth    . Cholecystectomy  05/10/2011    Procedure: LAPAROSCOPIC CHOLECYSTECTOMY WITH INTRAOPERATIVE CHOLANGIOGRAM;  Surgeon:  Gayland Curry, MD,FACS;  Location: [facility];  Service: General;  Laterality: N/A;  . Anterior cervical decomp/discectomy fusion N/A 12/09/2012    Procedure: ANTERIOR CERVICAL DECOMPRESSION/DISCECTOMY FUSION STRUCTURAL ALLOGRAFT TRESTLE PLATE CERVICAL FIVE-SIX,SIX-SEVEN;  Surgeon: Otilio Connors, MD;  Location: [facility] [facility];  Service: Neurosurgery;  Laterality: N/A;   Family History  Problem Relation Age of Onset  . Prostate cancer Maternal Grandfather   . Cancer Maternal Grandfather     prostate  . Colon polyps Maternal Grandmother   . Diabetes Mother   . Endometriosis Mother   . Cancer Mother     lung  . Diabetes Father   . Heart disease Father   . Cirrhosis Paternal Grandfather    History  Substance Use Topics  . Smoking status: Current Every Day Smoker -- 0.50 packs/day for 12 years    Types: Cigarettes  . Smokeless tobacco: Never Used     Comment: trying to quit  . Alcohol Use: Yes     Comment: wine cooler for occasion   OB History    No data available     Review of Systems  Constitutional: Negative for fever.  Musculoskeletal: Positive for myalgias and back pain.  Neurological: Positive for numbness. Negative for syncope.    Allergies  Banana and Latex  Home Medications   Prior to Admission medications  Medication Sig Start Date End Date Taking? Authorizing Provider  albuterol (PROVENTIL HFA;VENTOLIN HFA) 108 (90 BASE) MCG/ACT inhaler Inhale 2 puffs into the lungs every 4 (four) hours as needed for wheezing or shortness of breath. ProAir    Historical Provider, MD  ALPRAZolam Duanne Moron) 0.5 MG tablet Take 0.5 mg by mouth 3 (three) times daily.    Historical Provider, MD  ARIPiprazole (ABILIFY) 10 MG tablet Take 10 mg by mouth daily.    Historical Provider, MD  Aspirin-Acetaminophen-Caffeine (GOODY HEADACHE PO) Take 1 packet by mouth 3 (three) times daily as needed (pain).    Historical Provider, MD  busPIRone (BUSPAR) 15 MG tablet Take 15 mg by mouth 2 (two) times  daily.    Historical Provider, MD  cyclobenzaprine (FLEXERIL) 10 MG tablet Take 1 tablet (10 mg total) by mouth 2 (two) times daily as needed for muscle spasms. 06/13/14   Montine Circle, PA-C  docusate sodium (COLACE) 100 MG capsule Take 100 mg by mouth at bedtime as needed (constipation).     Historical Provider, MD  esomeprazole (NEXIUM) 40 MG capsule Take 40 mg by mouth daily.     Historical Provider, MD  ferrous sulfate 325 (65 FE) MG tablet Take 325 mg by mouth daily with breakfast.    Historical Provider, MD  FLUoxetine (PROZAC) 20 MG capsule Take 60 mg by mouth daily.    Historical Provider, MD  HYDROcodone-acetaminophen (NORCO/VICODIN) 5-325 MG per tablet Take 1-2 tablets by mouth every 4 (four) hours as needed. Patient not taking: Reported on 06/13/2014 06/06/14   Al Corpus, PA-C  ibuprofen (ADVIL,MOTRIN) 200 MG tablet Take 400 mg by mouth every 6 (six) hours as needed (pain).    Historical Provider, MD  methocarbamol (ROBAXIN-750) 750 MG tablet Take 1 tablet (750 mg total) by mouth every 8 (eight) hours as needed for muscle spasms. Patient not taking: Reported on 06/06/2014 03/27/14   Linton Flemings, MD  Multiple Vitamin (MULTIVITAMIN WITH MINERALS) TABS tablet Take 1 tablet by mouth daily.    Historical Provider, MD  nabumetone (RELAFEN) 750 MG tablet Take 750 mg by mouth 2 (two) times daily.    Historical Provider, MD  naproxen (NAPROSYN) 500 MG tablet Take 1 tablet (500 mg total) by mouth 2 (two) times daily. Patient not taking: Reported on 06/06/2014 03/27/14   Linton Flemings, MD  oxyCODONE-acetaminophen (PERCOCET) 5-325 MG per tablet Take 1 tablet by mouth every 6 (six) hours as needed for severe pain. Patient not taking: Reported on 06/06/2014 04/07/14   Junius Creamer, NP  predniSONE (DELTASONE) 20 MG tablet 3 Tabs PO Days 1-3, then 2 tabs PO Days 4-6, then 1 tab PO Day 7-9, then Half Tab PO Day 10-12 Patient not taking: Reported on 06/06/2014 04/07/14   Junius Creamer, NP  pregabalin (LYRICA) 150  MG capsule Take 150 mg by mouth 2 (two) times daily.    Historical Provider, MD  traZODone (DESYREL) 50 MG tablet Take 50 mg by mouth at bedtime.    Historical Provider, MD  trolamine salicylate (ASPERCREME) 10 % cream Apply 1 application topically 2 (two) times daily as needed for muscle pain.    Historical Provider, MD  zolpidem (AMBIEN) 10 MG tablet Take 10 mg by mouth at bedtime as needed for sleep.    Historical Provider, MD   BP 123/79 mmHg  Pulse 58  Temp(Src) 98.2 F (36.8 C) (Oral)  Resp 20  Ht 5' 3.5" (1.613 m)  Wt 265 lb 9.6 oz (120.475 kg)  BMI 46.31 kg/m2  SpO2 98%  LMP 07/25/2014 Physical Exam  Constitutional: She is oriented to person, place, and time. She appears well-developed and well-nourished. No distress.  HENT:  Head: Normocephalic and atraumatic.  Neck: Neck supple. No tracheal deviation present.  Cardiovascular: Normal rate.   Pedal pulse intact.   Pulmonary/Chest: Effort normal. No respiratory distress.  Musculoskeletal: Normal range of motion.  Morbidly obese appears uncomfortable and nontoxic.  TTP over the lumbo spine with no crepitus or step off. TTP on the right lumbo sacral.   Neurological: She is alert and oriented to person, place, and time.  Patellar deep reflexes intact. Able to ambulate.  Skin: Skin is warm and dry.  Psychiatric: She has a normal mood and affect. Her behavior is normal.  Nursing note and vitals reviewed.   ED Course  Procedures (including critical care time) DIAGNOSTIC STUDIES: Oxygen Saturation is 98% on RA, normal by my interpretation.    COORDINATION OF CARE: 9:39 PM- pt with acute on chronic radicular low back pain.  Report recent fall.  No obvious signs of injury on exam.  Pt is NVI.  Able to ambulate. Pt advised of plan for treatment which includes medication and pt agrees. Advised pt about the limited options available for treatment of a chronic pain problem here in the ED. Explained reasons behind limitations and  advised for pt to see specialist for further treatment.   Labs Review Labs Reviewed - No data to display  Imaging Review No results found.   EKG Interpretation None      MDM   Final diagnoses:  Chronic back pain  Fall from standing, initial encounter    BP 123/79 mmHg  Pulse 58  Temp(Src) 98.2 F (36.8 C) (Oral)  Resp 20  Ht 5' 3.5" (1.613 m)  Wt 265 lb 9.6 oz (120.475 kg)  BMI 46.31 kg/m2  SpO2 98%  LMP 07/25/2014  I personally performed the services described in this documentation, which was scribed in my presence. The recorded information has been reviewed and is accurate.      Domenic Moras, PA-C 07/29/14 2218  Serita Grit, MD 07/31/14 [phone]

## 2014-07-29 NOTE — Discharge Instructions (Signed)
Please follow up with neurosurgeon for further management of your recurrent low back pain and leg pain.    Chronic Back Pain  When back pain lasts longer than 3 months, it is called chronic back pain.People with chronic back pain often go through certain periods that are more intense (flare-ups).  CAUSES Chronic back pain can be caused by wear and tear (degeneration) on different structures in your back. These structures include:  The bones of your spine (vertebrae) and the joints surrounding your spinal cord and nerve roots (facets).  The strong, fibrous tissues that connect your vertebrae (ligaments). Degeneration of these structures may result in pressure on your nerves. This can lead to constant pain. HOME CARE INSTRUCTIONS  Avoid bending, heavy lifting, prolonged sitting, and activities which make the problem worse.  Take brief periods of rest throughout the day to reduce your pain. Lying down or standing usually is better than sitting while you are resting.  Take over-the-counter or prescription medicines only as directed by your caregiver. SEEK IMMEDIATE MEDICAL CARE IF:   You have weakness or numbness in one of your legs or feet.  You have trouble controlling your bladder or bowels.  You have nausea, vomiting, abdominal pain, shortness of breath, or fainting. Document Released: 04/19/2004 Document Revised: 06/04/2011 Document Reviewed: 02/24/2011 [facility] Patient Information 2015 [place], [place]. This information is not intended to replace advice given to you by your health care provider. Make sure you discuss any questions you have with your health care provider. Lumbosacral Radiculopathy Lumbosacral radiculopathy is a pinched nerve or nerves in the low back (lumbosacral area). When this happens you may have weakness in your legs and may not be able to stand on your toes. You may have pain going down into your legs. There may be difficulties with walking normally. There are many  causes of this problem. Sometimes this may happen from an injury, or simply from arthritis or boney problems. It may also be caused by other illnesses such as diabetes. If there is no improvement after treatment, further studies may be done to find the exact cause. DIAGNOSIS  X-rays may be needed if the problems become long standing. Electromyograms may be done. This study is one in which the working of nerves and muscles is studied. HOME CARE INSTRUCTIONS   Applications of ice packs may be helpful. Ice can be used in a plastic bag with a towel around it to prevent frostbite to skin. This may be used every 2 hours for 20 to 30 minutes, or as needed, while awake, or as directed by your caregiver.  Only take over-the-counter or prescription medicines for pain, discomfort, or fever as directed by your caregiver.  If physical therapy was prescribed, follow your caregiver's directions. SEEK IMMEDIATE MEDICAL CARE IF:   You have pain not controlled with medications.  You seem to be getting worse rather than better.  You develop increasing weakness in your legs.  You develop loss of bowel or bladder control.  You have difficulty with walking or balance, or develop clumsiness in the use of your legs.  You have a fever. MAKE SURE YOU:   Understand these instructions.  Will watch your condition.  Will get help right away if you are not doing well or get worse. Document Released: 03/12/2005 Document Revised: 06/04/2011 Document Reviewed: 10/31/2007 [facility] Patient Information 2015 [place], [place]. This information is not intended to replace advice given to you by your health care provider. Make sure you discuss any questions you have  with your health care provider. ° °

## 2014-07-29 NOTE — ED Notes (Signed)
CBG 88 

## 2014-07-30 LAB — CBG MONITORING, ED: GLUCOSE-CAPILLARY: 88 mg/dL (ref 70–99)

## 2014-08-04 ENCOUNTER — Encounter (HOSPITAL_COMMUNITY): Payer: Self-pay | Admitting: Emergency Medicine

## 2014-08-04 ENCOUNTER — Emergency Department (HOSPITAL_COMMUNITY)
Admission: EM | Admit: 2014-08-04 | Discharge: 2014-08-04 | Disposition: A | Discharge status: Home / Self Care | Payer: Medicaid Other | Attending: Emergency Medicine | Admitting: Emergency Medicine

## 2014-08-04 DIAGNOSIS — M1990 Unspecified osteoarthritis, unspecified site: Secondary | ICD-10-CM | POA: Insufficient documentation

## 2014-08-04 DIAGNOSIS — E669 Obesity, unspecified: Secondary | ICD-10-CM | POA: Insufficient documentation

## 2014-08-04 DIAGNOSIS — R609 Edema, unspecified: Secondary | ICD-10-CM

## 2014-08-04 DIAGNOSIS — G8929 Other chronic pain: Secondary | ICD-10-CM | POA: Insufficient documentation

## 2014-08-04 DIAGNOSIS — Z7982 Long term (current) use of aspirin: Secondary | ICD-10-CM | POA: Diagnosis not present

## 2014-08-04 DIAGNOSIS — R600 Localized edema: Secondary | ICD-10-CM | POA: Diagnosis not present

## 2014-08-04 DIAGNOSIS — F419 Anxiety disorder, unspecified: Secondary | ICD-10-CM | POA: Diagnosis not present

## 2014-08-04 DIAGNOSIS — Z8742 Personal history of other diseases of the female genital tract: Secondary | ICD-10-CM | POA: Diagnosis not present

## 2014-08-04 DIAGNOSIS — M7989 Other specified soft tissue disorders: Secondary | ICD-10-CM

## 2014-08-04 DIAGNOSIS — J45909 Unspecified asthma, uncomplicated: Secondary | ICD-10-CM | POA: Insufficient documentation

## 2014-08-04 DIAGNOSIS — G43909 Migraine, unspecified, not intractable, without status migrainosus: Secondary | ICD-10-CM | POA: Insufficient documentation

## 2014-08-04 DIAGNOSIS — Z91040 Latex allergy status: Secondary | ICD-10-CM | POA: Diagnosis not present

## 2014-08-04 DIAGNOSIS — M79605 Pain in left leg: Secondary | ICD-10-CM | POA: Diagnosis not present

## 2014-08-04 DIAGNOSIS — Z8541 Personal history of malignant neoplasm of cervix uteri: Secondary | ICD-10-CM | POA: Insufficient documentation

## 2014-08-04 DIAGNOSIS — M79602 Pain in left arm: Secondary | ICD-10-CM

## 2014-08-04 DIAGNOSIS — Z79899 Other long term (current) drug therapy: Secondary | ICD-10-CM | POA: Insufficient documentation

## 2014-08-04 DIAGNOSIS — Z720 Tobacco use: Secondary | ICD-10-CM | POA: Diagnosis not present

## 2014-08-04 DIAGNOSIS — Z8719 Personal history of other diseases of the digestive system: Secondary | ICD-10-CM | POA: Diagnosis not present

## 2014-08-04 DIAGNOSIS — D649 Anemia, unspecified: Secondary | ICD-10-CM | POA: Diagnosis not present

## 2014-08-04 DIAGNOSIS — M549 Dorsalgia, unspecified: Secondary | ICD-10-CM

## 2014-08-04 HISTORY — DX: Malignant neoplasm of cervix uteri, unspecified: C53.9

## 2014-08-04 HISTORY — DX: Malignant (primary) neoplasm, unspecified: C80.1

## 2014-08-04 MED ORDER — HYDROCODONE-ACETAMINOPHEN 5-325 MG PO TABS
1.0000 | ORAL_TABLET | Freq: Once | ORAL | Status: AC
  Administered 2014-08-04: 1 via ORAL
  Filled 2014-08-04: qty 1

## 2014-08-04 NOTE — ED Provider Notes (Signed)
CSN: [id number]     Arrival date & time 08/04/14  1910 History   First MD Initiated Contact with Patient 08/04/14 2105     Chief Complaint  Patient presents with  . Foot Swelling  . Leg Pain     (Consider location/radiation/quality/duration/timing/severity/associated sxs/prior Treatment) HPI Comments: [name] is a 29 y.o. female with a PMHx of scoliosis, migraines, anxiety, asthma, arthritis, remote PID, chronic leukocytosis and anemia, and chronic periph edema, who presents to the ED with complaints of ongoing chronic back pain which she reports is in the left side of her lower back radiating into her thigh, which she is under the care of Dr. Ellene Route at [facility]. She is tending to make an appointment with him. She also presents with complaints of 2 weeks of gradual onset BLE swelling with R>L which worsens when she leans on her right leg because of her left leg pain, and has been unrelieved with Maxzide. She reports that she has some pain in her feet which she describes as 6/10 "bruise type pain", intermittent, nonradiating, with no known aggravating factors, and unrelieved with Toradol, Lyrica, and muscle relaxers. She is under the care of her primary care doctor for this. She states that the swelling doesn't change with elevation, and the fluid pill is not helping much. She has chronic left leg numbness and tingling which is unchanged today. She denies any fevers, chills, headache, vision changes, dizziness, lightheadedness, skin changes, chest pain, shortness of breath, belly pain, nausea, vomiting, diarrhea, constipation, flank pain, dysuria, hematuria, cauda equina symptoms, vaginal bleeding or discharge, new numbness or tingling, or weakness. She denies any recent travel, surgery, immobilization, estrogen use, or history of DVT. She denies any calf pain or claudication.  Patient is a 29 y.o. female presenting with leg pain. The history is provided by the patient. No language  interpreter was used.  Leg Pain Location:  Foot Time since incident:  2 weeks Injury: no   Foot location:  R foot and L foot Pain details:    Quality:  Aching (like a bruise)   Radiates to:  Does not radiate   Severity:  Moderate   Onset quality:  Gradual   Duration:  2 weeks   Timing:  Intermittent   Progression:  Unchanged Chronicity:  New Prior injury to area:  No Relieved by:  Nothing Worsened by:  Nothing tried Ineffective treatments:  Muscle relaxant and NSAIDs (toradol, lyrica, and muscle relaxer) Associated symptoms: back pain (chronic) and swelling   Associated symptoms: no decreased ROM, no fever, no muscle weakness, no neck pain, no numbness and no tingling   Risk factors: obesity     Past Medical History  Diagnosis Date  . Scoliosis   . Obesity   . Migraine   . Anxiety   . Gallstones   . Asthma   . Arthritis   . Pelvic inflammatory disease (PID) 05-08-11    previous hx. .Hx. childbirth x3-NVD  . Leukocytosis     going to hematologist  . History of renal failure   . Edema   . History of anemia   . Cancer   . Cervical cancer    Past Surgical History  Procedure Laterality Date  . Abdominal exploration surgery  approx 29 years old    rlq(due to adhesions around ovaries)-appendix and ovaries remain  . Childbirth    . Cholecystectomy  05/10/2011    Procedure: LAPAROSCOPIC CHOLECYSTECTOMY WITH INTRAOPERATIVE CHOLANGIOGRAM;  Surgeon: Gayland Curry, MD,FACS;  Location: [facility];  Service: General;  Laterality: N/A;  . Anterior cervical decomp/discectomy fusion N/A 12/09/2012    Procedure: ANTERIOR CERVICAL DECOMPRESSION/DISCECTOMY FUSION STRUCTURAL ALLOGRAFT TRESTLE PLATE CERVICAL FIVE-SIX,SIX-SEVEN;  Surgeon: Otilio Connors, MD;  Location: [facility];  Service: Neurosurgery;  Laterality: N/A;   Family History  Problem Relation Age of Onset  . Prostate cancer Maternal Grandfather   . Cancer Maternal Grandfather     prostate  . Colon polyps Maternal  Grandmother   . Diabetes Mother   . Endometriosis Mother   . Cancer Mother     lung  . Diabetes Father   . Heart disease Father   . Cirrhosis Paternal Grandfather    History  Substance Use Topics  . Smoking status: Current Every Day Smoker -- 0.50 packs/day for 12 years    Types: Cigarettes  . Smokeless tobacco: Never Used     Comment: trying to quit  . Alcohol Use: Yes     Comment: wine cooler for occasion   OB History    No data available     Review of Systems  Constitutional: Negative for fever and chills.  Respiratory: Negative for shortness of breath.   Cardiovascular: Positive for leg swelling (BLE, with R>L). Negative for chest pain.  Gastrointestinal: Negative for nausea, vomiting, abdominal pain, diarrhea and constipation.  Genitourinary: Negative for dysuria, hematuria, flank pain, vaginal bleeding and vaginal discharge.  Musculoskeletal: Positive for back pain (chronic) and arthralgias (BLEs). Negative for myalgias, joint swelling and neck pain.  Skin: Negative for color change.  Allergic/Immunologic: Negative for immunocompromised state.  Neurological: Positive for numbness (chronic L leg numbness, no changes). Negative for dizziness, weakness, light-headedness and headaches.       No cauda equina symptoms  Psychiatric/Behavioral: Negative for confusion.   10 Systems reviewed and are negative for acute change except as noted in the HPI.    Allergies  Banana and Latex  Home Medications   Prior to Admission medications   Medication Sig Start Date End Date Taking? Authorizing Provider  albuterol (PROVENTIL HFA;VENTOLIN HFA) 108 (90 BASE) MCG/ACT inhaler Inhale 2 puffs into the lungs every 4 (four) hours as needed for wheezing or shortness of breath. ProAir    Historical Provider, MD  ALPRAZolam Duanne Moron) 0.5 MG tablet Take 0.5 mg by mouth 3 (three) times daily.    Historical Provider, MD  ARIPiprazole (ABILIFY) 10 MG tablet Take 10 mg by mouth daily.     Historical Provider, MD  Aspirin-Acetaminophen-Caffeine (GOODY HEADACHE PO) Take 1 packet by mouth 3 (three) times daily as needed (pain).    Historical Provider, MD  busPIRone (BUSPAR) 15 MG tablet Take 15 mg by mouth 2 (two) times daily.    Historical Provider, MD  cyclobenzaprine (FLEXERIL) 10 MG tablet Take 1 tablet (10 mg total) by mouth 2 (two) times daily as needed for muscle spasms. 06/13/14   Montine Circle, PA-C  docusate sodium (COLACE) 100 MG capsule Take 100 mg by mouth at bedtime as needed (constipation).     Historical Provider, MD  esomeprazole (NEXIUM) 40 MG capsule Take 40 mg by mouth daily.     Historical Provider, MD  ferrous sulfate 325 (65 FE) MG tablet Take 325 mg by mouth daily with breakfast.    Historical Provider, MD  FLUoxetine (PROZAC) 20 MG capsule Take 60 mg by mouth daily.    Historical Provider, MD  HYDROcodone-acetaminophen (NORCO/VICODIN) 5-325 MG per tablet Take 1-2 tablets by mouth every 4 (four) hours as needed. Patient not taking:  Reported on 06/13/2014 06/06/14   Al Corpus, PA-C  ibuprofen (ADVIL,MOTRIN) 200 MG tablet Take 400 mg by mouth every 6 (six) hours as needed (pain).    Historical Provider, MD  methocarbamol (ROBAXIN-750) 750 MG tablet Take 1 tablet (750 mg total) by mouth every 8 (eight) hours as needed for muscle spasms. Patient not taking: Reported on 06/06/2014 03/27/14   Linton Flemings, MD  Multiple Vitamin (MULTIVITAMIN WITH MINERALS) TABS tablet Take 1 tablet by mouth daily.    Historical Provider, MD  nabumetone (RELAFEN) 750 MG tablet Take 750 mg by mouth 2 (two) times daily.    Historical Provider, MD  naproxen (NAPROSYN) 500 MG tablet Take 1 tablet (500 mg total) by mouth 2 (two) times daily. Patient not taking: Reported on 06/06/2014 03/27/14   Linton Flemings, MD  oxyCODONE-acetaminophen (PERCOCET) 5-325 MG per tablet Take 1 tablet by mouth every 6 (six) hours as needed for severe pain. Patient not taking: Reported on 06/06/2014 04/07/14   Junius Creamer, NP  predniSONE (DELTASONE) 20 MG tablet 3 Tabs PO Days 1-3, then 2 tabs PO Days 4-6, then 1 tab PO Day 7-9, then Half Tab PO Day 10-12 Patient not taking: Reported on 06/06/2014 04/07/14   Junius Creamer, NP  pregabalin (LYRICA) 150 MG capsule Take 150 mg by mouth 2 (two) times daily.    Historical Provider, MD  traZODone (DESYREL) 50 MG tablet Take 50 mg by mouth at bedtime.    Historical Provider, MD  trolamine salicylate (ASPERCREME) 10 % cream Apply 1 application topically 2 (two) times daily as needed for muscle pain.    Historical Provider, MD  zolpidem (AMBIEN) 10 MG tablet Take 10 mg by mouth at bedtime as needed for sleep.    Historical Provider, MD   BP 113/69 mmHg  Pulse 99  Temp(Src) 98.6 F (37 C) (Oral)  Resp 20  SpO2 99%  LMP 07/25/2014 (Exact Date) Physical Exam  Constitutional: She is oriented to person, place, and time. Vital signs are normal. She appears well-developed and well-nourished.  Non-toxic appearance. No distress.  Afebrile, nontoxic, NAD  HENT:  Head: Normocephalic and atraumatic.  Mouth/Throat: Oropharynx is clear and moist and mucous membranes are normal.  Eyes: Conjunctivae and EOM are normal. Right eye exhibits no discharge. Left eye exhibits no discharge.  Neck: Normal range of motion. Neck supple. No spinous process tenderness and no muscular tenderness present. No rigidity. Normal range of motion present.  FROM intact without spinous process or paraspinous muscle TTP, no bony stepoffs or deformities, no muscle spasms. No rigidity or meningeal signs.   Cardiovascular: Normal rate, regular rhythm, normal heart sounds and intact distal pulses.  Exam reveals no gallop and no friction rub.   No murmur heard. Pulmonary/Chest: Effort normal and breath sounds normal. No respiratory distress. She has no decreased breath sounds. She has no wheezes. She has no rhonchi. She has no rales.  Abdominal: Soft. Normal appearance and bowel sounds are normal. She  exhibits no distension. There is no tenderness. There is no rigidity, no rebound, no guarding, no CVA tenderness, no tenderness at McBurney's point and negative Murphy's sign.  Musculoskeletal:       Lumbar back: She exhibits tenderness. She exhibits normal range of motion, no bony tenderness, no deformity and no spasm.       Back:  Lumbar spine with FROM intact without spinous process TTP, no bony stepoffs or deformities, mild L sided paraspinous muscle TTP without muscle spasms. Strength 5/5 in all extremities,  sensation grossly intact in all extremities, negative SLR bilaterally, gait steady and mildly antalgic with stooped over walk. No overlying skin changes.  BLEs with very trace pedal edema, R>L, with neg homan's bilaterally and no calf swelling noted, although body habitus limits exam. No skin changes. No focal TTP.  Neurological: She is alert and oriented to person, place, and time. She has normal strength. No sensory deficit. Gait (antalgic but steady) abnormal.  Antalgic gait but steady  Skin: Skin is warm, dry and intact. No rash noted.  No erythema or bruising to BLEs  Psychiatric: She has a normal mood and affect.  Nursing note and vitals reviewed.   ED Course  Procedures (including critical care time) Labs Review Labs Reviewed - No data to display  Imaging Review No results found.   EKG Interpretation None      MDM   Final diagnoses:  Chronic leg pain, left  Chronic back pain  Peripheral edema  Leg swelling    29 y.o. female here with chronic back pain with chronic L leg pain, already working on getting appt with neurosurgeon. No red flag s/sx for back pain, extremities neurovascularly intact, ambulatory with antalgic gait but steady, no s/sx of cord compression, doubt need for imaging. Also here for chronic BLE swelling, with R>L, ongoing x2 wks. Taking Maxide with minimal relief. No skin changes, neurovascularly intact with soft compartments, neg homan's, very  trace pedal edema bilaterally with R>L. Highly doubt DVT, although this is still on the differential. Will have her return in the morning for U/S. Will give her one pain pill here but none for home since we don't treat chronic pain and she has plenty of narcotics and pain meds at home. No hypoxia, tachycardia, or RFs for DVT/PE. Discussed f/up with neurosurgery and her PCP for these ongoing issues. I explained the diagnosis and have given explicit precautions to return to the ER including for any other new or worsening symptoms. The patient understands and accepts the medical plan as it's been dictated and I have answered their questions. Discharge instructions concerning home care and prescriptions have been given. The patient is STABLE and is discharged to home in good condition.  BP 113/69 mmHg  Pulse 99  Temp(Src) 98.6 F (37 C) (Oral)  Resp 20  SpO2 99%  LMP 07/25/2014 (Exact Date)  Meds ordered this encounter  Medications  . HYDROcodone-acetaminophen (NORCO/VICODIN) 5-325 MG per tablet 1 tablet    Sig:   . triamterene-hydrochlorothiazide (MAXZIDE-25) 37.5-25 MG per tablet    Sig: Take 1 tablet by mouth daily.      Carisma Troupe Camprubi-Soms, PA-C 08/04/14 2237  Wandra Arthurs, MD 08/07/14 [phone]

## 2014-08-04 NOTE — Discharge Instructions (Signed)
Use your home medications for pain. Use compression hose for your leg swelling and elevate your legs. Continue your maxide pills. Go to [facility] ultrasound department tomorrow for an ultrasound to rule out blood clots. Follow up with your neurosurgeon for your ongoing back pain, and with your regular doctor for ongoing medical care for your swelling. Return to the ER for changes or worsening symptoms.  IMPORTANT PATIENT INSTRUCTIONS: You have been scheduled for an Outpatient Vascular Study at [facility].   If tomorrow is a Saturday or Sunday, please go to the [facility] Emergency Department Registration Desk at 8:30 am tomorrow morning and tell them you are there for a vascular study. If tomorrow is a weekday (Monday-Friday), please go to [name] Admitting Department at 8 am and tell them you are there for a vascular study.   Chronic Back Pain  When back pain lasts longer than 3 months, it is called chronic back pain.People with chronic back pain often go through certain periods that are more intense (flare-ups).  CAUSES Chronic back pain can be caused by wear and tear (degeneration) on different structures in your back. These structures include:  The bones of your spine (vertebrae) and the joints surrounding your spinal cord and nerve roots (facets).  The strong, fibrous tissues that connect your vertebrae (ligaments). Degeneration of these structures may result in pressure on your nerves. This can lead to constant pain. HOME CARE INSTRUCTIONS  Avoid bending, heavy lifting, prolonged sitting, and activities which make the problem worse.  Take brief periods of rest throughout the day to reduce your pain. Lying down or standing usually is better than sitting while you are resting.  Take over-the-counter or prescription medicines only as directed by your caregiver. SEEK IMMEDIATE MEDICAL CARE IF:   You have weakness or numbness in one of your legs or feet.  You have trouble  controlling your bladder or bowels.  You have nausea, vomiting, abdominal pain, shortness of breath, or fainting. Document Released: 04/19/2004 Document Revised: 06/04/2011 Document Reviewed: 02/24/2011 [facility] Patient Information 2015 [place], [place]. This information is not intended to replace advice given to you by your health care provider. Make sure you discuss any questions you have with your health care provider.  Edema Edema is an abnormal buildup of fluids in your bodytissues. Edema is somewhatdependent on gravity to pull the fluid to the lowest place in your body. That makes the condition more common in the legs and thighs (lower extremities). Painless swelling of the feet and ankles is common and becomes more likely as you get older. It is also common in looser tissues, like around your eyes.  When the affected area is squeezed, the fluid may move out of that spot and leave a dent for a few moments. This dent is called pitting.  CAUSES  There are many possible causes of edema. Eating too much salt and being on your feet or sitting for a long time can cause edema in your legs and ankles. Hot weather may make edema worse. Common medical causes of edema include:  Heart failure.  Liver disease.  Kidney disease.  Weak blood vessels in your legs.  Cancer.  An injury.  Pregnancy.  Some medications.  Obesity. SYMPTOMS  Edema is usually painless.Your skin may look swollen or shiny.  DIAGNOSIS  Your health care provider may be able to diagnose edema by asking about your medical history and doing a physical exam. You may need to have tests such as X-rays,  an electrocardiogram, or blood tests to check for medical conditions that may cause edema.  TREATMENT  Edema treatment depends on the cause. If you have heart, liver, or kidney disease, you need the treatment appropriate for these conditions. General treatment may include:  Elevation of the affected body part above the level  of your heart.  Compression of the affected body part. Pressure from elastic bandages or support stockings squeezes the tissues and forces fluid back into the blood vessels. This keeps fluid from entering the tissues.  Restriction of fluid and salt intake.  Use of a water pill (diuretic). These medications are appropriate only for some types of edema. They pull fluid out of your body and make you urinate more often. This gets rid of fluid and reduces swelling, but diuretics can have side effects. Only use diuretics as directed by your health care provider. HOME CARE INSTRUCTIONS   Keep the affected body part above the level of your heart when you are lying down.   Do not sit still or stand for prolonged periods.   Do not put anything directly under your knees when lying down.  Do not wear constricting clothing or garters on your upper legs.   Exercise your legs to work the fluid back into your blood vessels. This may help the swelling go down.   Wear elastic bandages or support stockings to reduce ankle swelling as directed by your health care provider.   Eat a low-salt diet to reduce fluid if your health care provider recommends it.   Only take medicines as directed by your health care provider. SEEK MEDICAL CARE IF:   Your edema is not responding to treatment.  You have heart, liver, or kidney disease and notice symptoms of edema.  You have edema in your legs that does not improve after elevating them.   You have sudden and unexplained weight gain. SEEK IMMEDIATE MEDICAL CARE IF:   You develop shortness of breath or chest pain.   You cannot breathe when you lie down.  You develop pain, redness, or warmth in the swollen areas.   You have heart, liver, or kidney disease and suddenly get edema.  You have a fever and your symptoms suddenly get worse. MAKE SURE YOU:   Understand these instructions.  Will watch your condition.  Will get help right away if you  are not doing well or get worse. Document Released: 03/12/2005 Document Revised: 07/27/2013 Document Reviewed: 01/02/2013 [facility] Patient Information 2015 [place], [place]. This information is not intended to replace advice given to you by your health care provider. Make sure you discuss any questions you have with your health care provider.

## 2014-08-04 NOTE — ED Notes (Signed)
Pt states she is having sciatic pain in her left leg that is a chronic problem and she needs a MRI to find out why she has "nerve damage" but cannot get one scheduled  Pt states she is also having pain in her right foot and swelling that has been going on for the past two weeks or so  Pt states she thinks she has neuropathy but is not diabetic  Pt is anxious in words and actions in triage

## 2014-08-05 ENCOUNTER — Ambulatory Visit (HOSPITAL_COMMUNITY): Payer: Medicaid Other

## 2014-08-09 ENCOUNTER — Ambulatory Visit (HOSPITAL_COMMUNITY)
Admission: RE | Admit: 2014-08-09 | Discharge: 2014-08-09 | Disposition: A | Discharge status: Home / Self Care | Payer: Medicaid Other | Source: Ambulatory Visit | Attending: Physician Assistant | Admitting: Physician Assistant

## 2014-08-09 DIAGNOSIS — M7989 Other specified soft tissue disorders: Secondary | ICD-10-CM | POA: Diagnosis not present

## 2014-08-09 NOTE — Progress Notes (Signed)
VASCULAR LAB PRELIMINARY  PRELIMINARY  PRELIMINARY  PRELIMINARY  Right lower extremity venous duplex completed.    Preliminary report:  Right:  No evidence of DVT, superficial thrombosis, or Baker's cyst.  Nichoel Digiulio, RVT 08/09/2014, 3:36 PM

## 2014-09-29 ENCOUNTER — Other Ambulatory Visit: Payer: Self-pay | Admitting: Neurological Surgery

## 2014-09-29 DIAGNOSIS — M5416 Radiculopathy, lumbar region: Secondary | ICD-10-CM

## 2014-10-08 ENCOUNTER — Ambulatory Visit
Admission: RE | Admit: 2014-10-08 | Discharge: 2014-10-08 | Disposition: A | Discharge status: Home / Self Care | Payer: Medicaid Other | Source: Ambulatory Visit | Attending: Neurological Surgery | Admitting: Neurological Surgery

## 2014-10-08 DIAGNOSIS — M5416 Radiculopathy, lumbar region: Secondary | ICD-10-CM

## 2014-11-25 ENCOUNTER — Other Ambulatory Visit: Payer: Self-pay | Admitting: Neurological Surgery

## 2014-11-26 ENCOUNTER — Encounter (HOSPITAL_COMMUNITY): Payer: Self-pay | Admitting: *Deleted

## 2014-11-29 NOTE — Anesthesia Preprocedure Evaluation (Addendum)
Anesthesia Evaluation  Patient identified by MRN, date of birth, ID band Patient awake    Reviewed: Allergy & Precautions, NPO status , Patient's Chart, lab work & pertinent test results  History of Anesthesia Complications Negative for: history of anesthetic complications  Airway Mallampati: II  TM Distance: >3 FB Neck ROM: Full    Dental no notable dental hx. (+) Poor Dentition, Missing   Pulmonary asthma , Current Smoker,  breath sounds clear to auscultation  Pulmonary exam normal       Cardiovascular negative cardio ROS Normal cardiovascular examRhythm:Regular Rate:Normal     Neuro/Psych  Headaches, PSYCHIATRIC DISORDERS (panic attacks) Anxiety    GI/Hepatic Neg liver ROS, GERD-  Medicated and Controlled,  Endo/Other  Morbid obesity  Renal/GU negative Renal ROS  negative genitourinary   Musculoskeletal  (+) Arthritis -,   Abdominal (+) + obese,   Peds negative pediatric ROS (+)  Hematology negative hematology ROS (+)   Anesthesia Other Findings   Reproductive/Obstetrics negative OB ROS                            Anesthesia Physical Anesthesia Plan  ASA: III  Anesthesia Plan: General   Post-op Pain Management:    Induction: Intravenous  Airway Management Planned: Oral ETT  Additional Equipment:   Intra-op Plan:   Post-operative Plan:   Informed Consent:   Plan Discussed with:   Anesthesia Plan Comments:         Anesthesia Quick Evaluation

## 2014-11-30 ENCOUNTER — Observation Stay (HOSPITAL_COMMUNITY)
Admission: RE | Admit: 2014-11-30 | Discharge: 2014-12-01 | Disposition: A | Discharge status: Home / Self Care | Payer: Medicaid Other | Source: Ambulatory Visit | Attending: Neurological Surgery | Admitting: Neurological Surgery

## 2014-11-30 ENCOUNTER — Encounter (HOSPITAL_COMMUNITY): Payer: Self-pay | Admitting: Anesthesiology

## 2014-11-30 ENCOUNTER — Ambulatory Visit (HOSPITAL_COMMUNITY): Payer: Medicaid Other | Admitting: Anesthesiology

## 2014-11-30 ENCOUNTER — Encounter (HOSPITAL_COMMUNITY)
Admission: RE | Disposition: A | Discharge status: Home / Self Care | Payer: Self-pay | Source: Ambulatory Visit | Attending: Neurological Surgery

## 2014-11-30 ENCOUNTER — Ambulatory Visit (HOSPITAL_COMMUNITY): Payer: Medicaid Other

## 2014-11-30 DIAGNOSIS — Z6841 Body Mass Index (BMI) 40.0 and over, adult: Secondary | ICD-10-CM | POA: Diagnosis not present

## 2014-11-30 DIAGNOSIS — F17210 Nicotine dependence, cigarettes, uncomplicated: Secondary | ICD-10-CM | POA: Diagnosis not present

## 2014-11-30 DIAGNOSIS — M419 Scoliosis, unspecified: Secondary | ICD-10-CM | POA: Insufficient documentation

## 2014-11-30 DIAGNOSIS — Z419 Encounter for procedure for purposes other than remedying health state, unspecified: Secondary | ICD-10-CM

## 2014-11-30 DIAGNOSIS — Z8541 Personal history of malignant neoplasm of cervix uteri: Secondary | ICD-10-CM | POA: Insufficient documentation

## 2014-11-30 DIAGNOSIS — M5126 Other intervertebral disc displacement, lumbar region: Secondary | ICD-10-CM | POA: Diagnosis not present

## 2014-11-30 HISTORY — DX: Constipation, unspecified: K59.00

## 2014-11-30 HISTORY — DX: Other cervical disc degeneration, unspecified cervical region: M50.30

## 2014-11-30 HISTORY — DX: Anogenital (venereal) warts: A63.0

## 2014-11-30 HISTORY — DX: Gastro-esophageal reflux disease without esophagitis: K21.9

## 2014-11-30 HISTORY — PX: LUMBAR LAMINECTOMY/DECOMPRESSION MICRODISCECTOMY: SHX5026

## 2014-11-30 HISTORY — DX: Post-traumatic stress disorder, unspecified: F43.10

## 2014-11-30 HISTORY — DX: Female pelvic inflammatory disease, unspecified: N73.9

## 2014-11-30 LAB — COMPREHENSIVE METABOLIC PANEL
ALBUMIN: 3.4 g/dL — AB (ref 3.5–5.0)
ALT: 18 U/L (ref 14–54)
AST: 20 U/L (ref 15–41)
Alkaline Phosphatase: 73 U/L (ref 38–126)
Anion gap: 8 (ref 5–15)
BUN: 10 mg/dL (ref 6–20)
CHLORIDE: 107 mmol/L (ref 101–111)
CO2: 23 mmol/L (ref 22–32)
Calcium: 8.8 mg/dL — ABNORMAL LOW (ref 8.9–10.3)
Creatinine, Ser: 0.98 mg/dL (ref 0.44–1.00)
GFR calc Af Amer: >60 mL/min (ref >60)
GFR calc non Af Amer: >60 mL/min (ref >60)
GLUCOSE: 85 mg/dL (ref 65–99)
POTASSIUM: 3.7 mmol/L (ref 3.5–5.1)
Sodium: 138 mmol/L (ref 135–145)
Total Bilirubin: 0.3 mg/dL (ref 0.3–1.2)
Total Protein: 6.4 g/dL — ABNORMAL LOW (ref 6.5–8.1)

## 2014-11-30 LAB — SURGICAL PCR SCREEN
MRSA, PCR: NEGATIVE
STAPHYLOCOCCUS AUREUS: NEGATIVE

## 2014-11-30 LAB — CBC
HEMATOCRIT: 35.9 % — AB (ref 36.0–46.0)
HEMOGLOBIN: 11.4 g/dL — AB (ref 12.0–15.0)
MCH: 25 pg — ABNORMAL LOW (ref 26.0–34.0)
MCHC: 31.8 g/dL (ref 30.0–36.0)
MCV: 78.7 fL (ref 78.0–100.0)
Platelets: 273 10*3/uL (ref 150–400)
RBC: 4.56 MIL/uL (ref 3.87–5.11)
RDW: 18.7 % — ABNORMAL HIGH (ref 11.5–15.5)
WBC: 13 10*3/uL — AB (ref 4.0–10.5)

## 2014-11-30 LAB — HCG, SERUM, QUALITATIVE: Preg, Serum: NEGATIVE

## 2014-11-30 SURGERY — LUMBAR LAMINECTOMY/DECOMPRESSION MICRODISCECTOMY 1 LEVEL
Anesthesia: General | Site: Back | Laterality: Left

## 2014-11-30 MED ORDER — FENTANYL CITRATE (PF) 100 MCG/2ML IJ SOLN
INTRAMUSCULAR | Status: AC
  Filled 2014-11-30: qty 2

## 2014-11-30 MED ORDER — SODIUM CHLORIDE 0.9 % IV SOLN: 250.0000 mL | INTRAVENOUS | Status: DC

## 2014-11-30 MED ORDER — PRAZOSIN HCL 2 MG PO CAPS
2.0000 mg | ORAL_CAPSULE | Freq: Every day | ORAL | Status: DC
  Administered 2014-12-01: 2 mg via ORAL
  Filled 2014-11-30 (×2): qty 1

## 2014-11-30 MED ORDER — ONDANSETRON HCL 4 MG/2ML IJ SOLN: 4.0000 mg | Freq: Once | INTRAMUSCULAR | Status: DC | PRN

## 2014-11-30 MED ORDER — HYDROMORPHONE HCL 1 MG/ML IJ SOLN
INTRAMUSCULAR | Status: AC
  Filled 2014-11-30: qty 1

## 2014-11-30 MED ORDER — PROPOFOL 10 MG/ML IV BOLUS
INTRAVENOUS | Status: DC | PRN
  Administered 2014-11-30: 200 mg via INTRAVENOUS

## 2014-11-30 MED ORDER — PREGABALIN 75 MG PO CAPS
150.0000 mg | ORAL_CAPSULE | Freq: Two times a day (BID) | ORAL | Status: DC
  Administered 2014-11-30 – 2014-12-01 (×3): 150 mg via ORAL
  Filled 2014-11-30 (×3): qty 2

## 2014-11-30 MED ORDER — FENTANYL CITRATE (PF) 100 MCG/2ML IJ SOLN
INTRAMUSCULAR | Status: DC | PRN
  Administered 2014-11-30: 100 ug via INTRAVENOUS
  Administered 2014-11-30: 50 ug via INTRAVENOUS
  Administered 2014-11-30: 100 ug via INTRAVENOUS
  Administered 2014-11-30: 50 ug via INTRAVENOUS
  Administered 2014-11-30 (×2): 100 ug via INTRAVENOUS

## 2014-11-30 MED ORDER — NICOTINE 21 MG/24HR TD PT24
21.0000 mg | MEDICATED_PATCH | Freq: Every day | TRANSDERMAL | Status: DC
  Administered 2014-11-30: 21 mg via TRANSDERMAL
  Filled 2014-11-30 (×2): qty 1

## 2014-11-30 MED ORDER — GLYCOPYRROLATE 0.2 MG/ML IJ SOLN
INTRAMUSCULAR | Status: AC
  Filled 2014-11-30: qty 2

## 2014-11-30 MED ORDER — METHOCARBAMOL 500 MG PO TABS
500.0000 mg | ORAL_TABLET | Freq: Four times a day (QID) | ORAL | Status: DC | PRN
  Administered 2014-12-01: 500 mg via ORAL
  Filled 2014-11-30: qty 1

## 2014-11-30 MED ORDER — ARIPIPRAZOLE 5 MG PO TABS
5.0000 mg | ORAL_TABLET | Freq: Every day | ORAL | Status: DC
  Administered 2014-11-30 – 2014-12-01 (×2): 5 mg via ORAL
  Filled 2014-11-30 (×2): qty 1

## 2014-11-30 MED ORDER — METHOCARBAMOL 1000 MG/10ML IJ SOLN
500.0000 mg | Freq: Four times a day (QID) | INTRAVENOUS | Status: DC | PRN
  Filled 2014-11-30: qty 5

## 2014-11-30 MED ORDER — PHENYLEPHRINE HCL 10 MG/ML IJ SOLN
INTRAMUSCULAR | Status: DC | PRN
  Administered 2014-11-30 (×2): 80 ug via INTRAVENOUS

## 2014-11-30 MED ORDER — NEOSTIGMINE METHYLSULFATE 10 MG/10ML IV SOLN
INTRAVENOUS | Status: DC | PRN
  Administered 2014-11-30: 3 mg via INTRAVENOUS

## 2014-11-30 MED ORDER — ALPRAZOLAM 0.5 MG PO TABS
1.0000 mg | ORAL_TABLET | Freq: Two times a day (BID) | ORAL | Status: DC
  Administered 2014-11-30 – 2014-12-01 (×3): 1 mg via ORAL
  Filled 2014-11-30 (×3): qty 2

## 2014-11-30 MED ORDER — FENTANYL CITRATE (PF) 100 MCG/2ML IJ SOLN
25.0000 ug | INTRAMUSCULAR | Status: DC | PRN
  Administered 2014-11-30 (×2): 50 ug via INTRAVENOUS

## 2014-11-30 MED ORDER — ALUM & MAG HYDROXIDE-SIMETH 200-200-20 MG/5ML PO SUSP
30.0000 mL | Freq: Four times a day (QID) | ORAL | Status: DC | PRN
  Administered 2014-12-01 (×2): 30 mL via ORAL
  Filled 2014-11-30 (×2): qty 30

## 2014-11-30 MED ORDER — DEXMEDETOMIDINE HCL 200 MCG/2ML IV SOLN
INTRAVENOUS | Status: DC | PRN
  Administered 2014-11-30 (×2): 10 ug via INTRAVENOUS
  Administered 2014-11-30: 20 ug via INTRAVENOUS

## 2014-11-30 MED ORDER — LIDOCAINE HCL (CARDIAC) 20 MG/ML IV SOLN
INTRAVENOUS | Status: AC
  Filled 2014-11-30: qty 5

## 2014-11-30 MED ORDER — SODIUM CHLORIDE 0.9 % IJ SOLN
3.0000 mL | Freq: Two times a day (BID) | INTRAMUSCULAR | Status: DC
  Administered 2014-11-30: 3 mL via INTRAVENOUS

## 2014-11-30 MED ORDER — DEXTROSE 5 % IV SOLN
3.0000 g | INTRAVENOUS | Status: AC
  Administered 2014-11-30: 3 g via INTRAVENOUS
  Filled 2014-11-30: qty 3000

## 2014-11-30 MED ORDER — SODIUM CHLORIDE 0.9 % IR SOLN
Status: DC | PRN
  Administered 2014-11-30: 10:00:00

## 2014-11-30 MED ORDER — ONDANSETRON HCL 4 MG/2ML IJ SOLN: 4.0000 mg | INTRAMUSCULAR | Status: DC | PRN

## 2014-11-30 MED ORDER — LIDOCAINE HCL (CARDIAC) 20 MG/ML IV SOLN
INTRAVENOUS | Status: DC | PRN
  Administered 2014-11-30: 100 mg via INTRAVENOUS

## 2014-11-30 MED ORDER — BISACODYL 10 MG RE SUPP: 10.0000 mg | Freq: Every day | RECTAL | Status: DC | PRN

## 2014-11-30 MED ORDER — TRAZODONE HCL 50 MG PO TABS
50.0000 mg | ORAL_TABLET | Freq: Every day | ORAL | Status: DC
  Administered 2014-11-30 – 2014-12-01 (×2): 50 mg via ORAL
  Filled 2014-11-30 (×2): qty 1

## 2014-11-30 MED ORDER — FLUCONAZOLE 150 MG PO TABS
150.0000 mg | ORAL_TABLET | Freq: Every day | ORAL | Status: DC
  Administered 2014-11-30 – 2014-12-01 (×2): 150 mg via ORAL
  Filled 2014-11-30 (×2): qty 1

## 2014-11-30 MED ORDER — FERROUS SULFATE 325 (65 FE) MG PO TABS
325.0000 mg | ORAL_TABLET | Freq: Every day | ORAL | Status: DC
  Administered 2014-12-01: 325 mg via ORAL
  Filled 2014-11-30: qty 1

## 2014-11-30 MED ORDER — CEFAZOLIN SODIUM-DEXTROSE 2-3 GM-% IV SOLR: 2.0000 g | INTRAVENOUS | Status: DC

## 2014-11-30 MED ORDER — ONDANSETRON HCL 4 MG/2ML IJ SOLN
INTRAMUSCULAR | Status: AC
  Filled 2014-11-30: qty 2

## 2014-11-30 MED ORDER — GLYCOPYRROLATE 0.2 MG/ML IJ SOLN
INTRAMUSCULAR | Status: DC | PRN
  Administered 2014-11-30: 0.4 mg via INTRAVENOUS

## 2014-11-30 MED ORDER — SODIUM CHLORIDE 0.9 % IJ SOLN: 3.0000 mL | INTRAMUSCULAR | Status: DC | PRN

## 2014-11-30 MED ORDER — DEXAMETHASONE SODIUM PHOSPHATE 10 MG/ML IJ SOLN
INTRAMUSCULAR | Status: AC
  Filled 2014-11-30: qty 1

## 2014-11-30 MED ORDER — KETOROLAC TROMETHAMINE 15 MG/ML IJ SOLN
15.0000 mg | Freq: Four times a day (QID) | INTRAMUSCULAR | Status: DC
  Administered 2014-11-30 – 2014-12-01 (×4): 15 mg via INTRAVENOUS
  Filled 2014-11-30 (×4): qty 1

## 2014-11-30 MED ORDER — PANTOPRAZOLE SODIUM 40 MG PO TBEC
40.0000 mg | DELAYED_RELEASE_TABLET | Freq: Every day | ORAL | Status: DC
Start: 2014-11-30 — End: 2014-12-01
  Administered 2014-11-30 – 2014-12-01 (×2): 40 mg via ORAL
  Filled 2014-11-30 (×3): qty 1

## 2014-11-30 MED ORDER — MENTHOL 3 MG MT LOZG
1.0000 | LOZENGE | OROMUCOSAL | Status: DC | PRN
  Filled 2014-11-30: qty 9

## 2014-11-30 MED ORDER — ACETAMINOPHEN 650 MG RE SUPP
650.0000 mg | RECTAL | Status: DC | PRN
Start: 2014-11-30 — End: 2014-12-01

## 2014-11-30 MED ORDER — DEXAMETHASONE SODIUM PHOSPHATE 10 MG/ML IJ SOLN
INTRAMUSCULAR | Status: DC | PRN
  Administered 2014-11-30: 10 mg via INTRAVENOUS

## 2014-11-30 MED ORDER — ACETAMINOPHEN 10 MG/ML IV SOLN
INTRAVENOUS | Status: AC
  Administered 2014-11-30: 1000 mg via INTRAVENOUS
  Filled 2014-11-30: qty 100

## 2014-11-30 MED ORDER — DOCUSATE SODIUM 100 MG PO CAPS
100.0000 mg | ORAL_CAPSULE | Freq: Two times a day (BID) | ORAL | Status: DC
  Administered 2014-11-30 – 2014-12-01 (×3): 100 mg via ORAL
  Filled 2014-11-30 (×3): qty 1

## 2014-11-30 MED ORDER — HYDROMORPHONE HCL 1 MG/ML IJ SOLN
INTRAMUSCULAR | Status: DC | PRN
  Administered 2014-11-30: 1 mg via INTRAVENOUS

## 2014-11-30 MED ORDER — ZOLPIDEM TARTRATE 5 MG PO TABS
5.0000 mg | ORAL_TABLET | Freq: Every evening | ORAL | Status: DC | PRN
Start: 2014-11-30 — End: 2014-12-01
  Administered 2014-11-30: 5 mg via ORAL
  Filled 2014-11-30: qty 1

## 2014-11-30 MED ORDER — MUPIROCIN 2 % EX OINT
1.0000 "application " | TOPICAL_OINTMENT | Freq: Once | CUTANEOUS | Status: AC
  Administered 2014-11-30: 1 via TOPICAL

## 2014-11-30 MED ORDER — MIDAZOLAM HCL 2 MG/2ML IJ SOLN
INTRAMUSCULAR | Status: AC
  Filled 2014-11-30: qty 4

## 2014-11-30 MED ORDER — LACTATED RINGERS IV SOLN
INTRAVENOUS | Status: DC
  Administered 2014-11-30 (×3): via INTRAVENOUS

## 2014-11-30 MED ORDER — MUPIROCIN 2 % EX OINT
TOPICAL_OINTMENT | CUTANEOUS | Status: AC
  Filled 2014-11-30: qty 22

## 2014-11-30 MED ORDER — BUSPIRONE HCL 15 MG PO TABS
15.0000 mg | ORAL_TABLET | Freq: Two times a day (BID) | ORAL | Status: DC
  Administered 2014-11-30 – 2014-12-01 (×3): 15 mg via ORAL
  Filled 2014-11-30 (×3): qty 1

## 2014-11-30 MED ORDER — CYCLOBENZAPRINE HCL 10 MG PO TABS: 10.0000 mg | ORAL_TABLET | Freq: Two times a day (BID) | ORAL | Status: DC | PRN

## 2014-11-30 MED ORDER — FENTANYL CITRATE (PF) 250 MCG/5ML IJ SOLN
INTRAMUSCULAR | Status: AC
  Filled 2014-11-30: qty 5

## 2014-11-30 MED ORDER — POLYETHYLENE GLYCOL 3350 17 G PO PACK: 17.0000 g | PACK | Freq: Every day | ORAL | Status: DC | PRN

## 2014-11-30 MED ORDER — SENNA 8.6 MG PO TABS
1.0000 | ORAL_TABLET | Freq: Two times a day (BID) | ORAL | Status: DC
  Administered 2014-11-30 – 2014-12-01 (×3): 8.6 mg via ORAL
  Filled 2014-11-30 (×3): qty 1

## 2014-11-30 MED ORDER — HEMOSTATIC AGENTS (NO CHARGE) OPTIME
TOPICAL | Status: DC | PRN
  Administered 2014-11-30: 1 via TOPICAL

## 2014-11-30 MED ORDER — BUPIVACAINE HCL (PF) 0.5 % IJ SOLN
INTRAMUSCULAR | Status: DC | PRN
  Administered 2014-11-30: 10 mL

## 2014-11-30 MED ORDER — MIDAZOLAM HCL 5 MG/5ML IJ SOLN
INTRAMUSCULAR | Status: DC | PRN
  Administered 2014-11-30: 2 mg via INTRAVENOUS

## 2014-11-30 MED ORDER — PROPOFOL 10 MG/ML IV BOLUS
INTRAVENOUS | Status: AC
  Filled 2014-11-30: qty 20

## 2014-11-30 MED ORDER — NEOSTIGMINE METHYLSULFATE 10 MG/10ML IV SOLN
INTRAVENOUS | Status: AC
  Filled 2014-11-30: qty 1

## 2014-11-30 MED ORDER — ACETAMINOPHEN 325 MG PO TABS: 650.0000 mg | ORAL_TABLET | ORAL | Status: DC | PRN

## 2014-11-30 MED ORDER — ROCURONIUM BROMIDE 100 MG/10ML IV SOLN
INTRAVENOUS | Status: DC | PRN
  Administered 2014-11-30: 50 mg via INTRAVENOUS

## 2014-11-30 MED ORDER — PHENOL 1.4 % MT LIQD: 1.0000 | OROMUCOSAL | Status: DC | PRN

## 2014-11-30 MED ORDER — DOCUSATE SODIUM 100 MG PO CAPS: 100.0000 mg | ORAL_CAPSULE | Freq: Every evening | ORAL | Status: DC | PRN

## 2014-11-30 MED ORDER — FLUOXETINE HCL 20 MG PO CAPS
80.0000 mg | ORAL_CAPSULE | Freq: Every day | ORAL | Status: DC
  Administered 2014-11-30 – 2014-12-01 (×2): 80 mg via ORAL
  Filled 2014-11-30 (×2): qty 4

## 2014-11-30 MED ORDER — 0.9 % SODIUM CHLORIDE (POUR BTL) OPTIME
TOPICAL | Status: DC | PRN
  Administered 2014-11-30: 1000 mL

## 2014-11-30 MED ORDER — PHENYLEPHRINE 40 MCG/ML (10ML) SYRINGE FOR IV PUSH (FOR BLOOD PRESSURE SUPPORT)
PREFILLED_SYRINGE | INTRAVENOUS | Status: AC
  Filled 2014-11-30: qty 10

## 2014-11-30 MED ORDER — HYDROCODONE-ACETAMINOPHEN 5-325 MG PO TABS: 1.0000 | ORAL_TABLET | ORAL | Status: DC | PRN

## 2014-11-30 MED ORDER — MORPHINE SULFATE (PF) 2 MG/ML IV SOLN
1.0000 mg | INTRAVENOUS | Status: DC | PRN
  Administered 2014-11-30: 2 mg via INTRAVENOUS
  Filled 2014-11-30: qty 1

## 2014-11-30 MED ORDER — OXYCODONE-ACETAMINOPHEN 5-325 MG PO TABS
1.0000 | ORAL_TABLET | ORAL | Status: DC | PRN
  Administered 2014-11-30 – 2014-12-01 (×5): 2 via ORAL
  Filled 2014-11-30 (×5): qty 2

## 2014-11-30 MED ORDER — ONDANSETRON HCL 4 MG/2ML IJ SOLN
INTRAMUSCULAR | Status: DC | PRN
  Administered 2014-11-30: 4 mg via INTRAVENOUS

## 2014-11-30 MED ORDER — THROMBIN 5000 UNITS EX SOLR
OROMUCOSAL | Status: DC | PRN
  Administered 2014-11-30: 11:00:00 via TOPICAL

## 2014-11-30 MED ORDER — LIDOCAINE-EPINEPHRINE 1 %-1:100000 IJ SOLN
INTRAMUSCULAR | Status: DC | PRN
Start: 2014-11-30 — End: 2014-11-30
  Administered 2014-11-30: 10 mL

## 2014-11-30 SURGICAL SUPPLY — 52 items
BAG DECANTER FOR FLEXI CONT (MISCELLANEOUS) ×3 IMPLANT
BLADE CLIPPER SURG (BLADE) IMPLANT
BUR ACORN 6.0 (BURR) ×2 IMPLANT
BUR ACORN 6.0MM (BURR) ×1
BUR MATCHSTICK NEURO 3.0 LAGG (BURR) ×3 IMPLANT
CANISTER SUCT 3000ML PPV (MISCELLANEOUS) ×3 IMPLANT
DECANTER SPIKE VIAL GLASS SM (MISCELLANEOUS) ×3 IMPLANT
DERMABOND ADVANCED (GAUZE/BANDAGES/DRESSINGS) ×2
DERMABOND ADVANCED .7 DNX12 (GAUZE/BANDAGES/DRESSINGS) ×1 IMPLANT
DRAPE LAPAROTOMY T 102X78X121 (DRAPES) ×3 IMPLANT
DRAPE MICROSCOPE LEICA (MISCELLANEOUS) ×3 IMPLANT
DRAPE POUCH INSTRU U-SHP 10X18 (DRAPES) ×3 IMPLANT
DRAPE PROXIMA HALF (DRAPES) IMPLANT
DURAPREP 26ML APPLICATOR (WOUND CARE) ×3 IMPLANT
ELECT REM PT RETURN 9FT ADLT (ELECTROSURGICAL) ×3
ELECTRODE REM PT RTRN 9FT ADLT (ELECTROSURGICAL) ×1 IMPLANT
GAUZE SPONGE 4X4 12PLY STRL (GAUZE/BANDAGES/DRESSINGS) ×3 IMPLANT
GAUZE SPONGE 4X4 16PLY XRAY LF (GAUZE/BANDAGES/DRESSINGS) IMPLANT
GLOVE BIOGEL PI IND STRL 8.5 (GLOVE) ×1 IMPLANT
GLOVE BIOGEL PI INDICATOR 8.5 (GLOVE) ×2
GLOVE ECLIPSE 8.5 STRL (GLOVE) IMPLANT
GLOVE EXAM NITRILE LRG STRL (GLOVE) IMPLANT
GLOVE EXAM NITRILE MD LF STRL (GLOVE) IMPLANT
GLOVE EXAM NITRILE XL STR (GLOVE) IMPLANT
GLOVE EXAM NITRILE XS STR PU (GLOVE) IMPLANT
GLOVE INDICATOR 7.0 STRL GRN (GLOVE) ×3 IMPLANT
GLOVE SS PI 9.0 STRL (GLOVE) ×3 IMPLANT
GLOVE SURG SS PI 7.0 STRL IVOR (GLOVE) ×3 IMPLANT
GLOVE SURG SS PI 8.5 STRL IVOR (GLOVE) ×2
GLOVE SURG SS PI 8.5 STRL STRW (GLOVE) ×1 IMPLANT
GOWN STRL REUS W/ TWL LRG LVL3 (GOWN DISPOSABLE) IMPLANT
GOWN STRL REUS W/ TWL XL LVL3 (GOWN DISPOSABLE) ×2 IMPLANT
GOWN STRL REUS W/TWL 2XL LVL3 (GOWN DISPOSABLE) ×3 IMPLANT
GOWN STRL REUS W/TWL LRG LVL3 (GOWN DISPOSABLE)
GOWN STRL REUS W/TWL XL LVL3 (GOWN DISPOSABLE) ×4
KIT BASIN OR (CUSTOM PROCEDURE TRAY) ×3 IMPLANT
KIT ROOM TURNOVER OR (KITS) ×3 IMPLANT
NEEDLE HYPO 22GX1.5 SAFETY (NEEDLE) ×3 IMPLANT
NEEDLE SPNL 20GX3.5 QUINCKE YW (NEEDLE) ×3 IMPLANT
NS IRRIG 1000ML POUR BTL (IV SOLUTION) ×3 IMPLANT
PACK LAMINECTOMY NEURO (CUSTOM PROCEDURE TRAY) ×3 IMPLANT
PAD ARMBOARD 7.5X6 YLW CONV (MISCELLANEOUS) ×9 IMPLANT
PATTIES SURGICAL .5 X1 (DISPOSABLE) ×3 IMPLANT
RUBBERBAND STERILE (MISCELLANEOUS) ×6 IMPLANT
SPONGE SURGIFOAM ABS GEL SZ50 (HEMOSTASIS) ×3 IMPLANT
SUT VIC AB 1 CT1 18XBRD ANBCTR (SUTURE) ×1 IMPLANT
SUT VIC AB 1 CT1 8-18 (SUTURE) ×2
SUT VIC AB 2-0 CP2 18 (SUTURE) ×3 IMPLANT
SUT VIC AB 3-0 SH 8-18 (SUTURE) ×6 IMPLANT
TOWEL OR 17X24 6PK STRL BLUE (TOWEL DISPOSABLE) ×3 IMPLANT
TOWEL OR 17X26 10 PK STRL BLUE (TOWEL DISPOSABLE) ×3 IMPLANT
WATER STERILE IRR 1000ML POUR (IV SOLUTION) ×3 IMPLANT

## 2014-11-30 NOTE — H&P (Signed)
[name] is an 29 y.o. female.   Chief Complaint: Back and left leg pain with weakness and left leg HPI: Ms. [name] is a 29 year old individual who's had significant back and left lower extremity pain for over a years period time she is treated conservatively initially. She subsequently was advised to have an MRI. For nearly ears. Of time this was declined by Medicaid but ultimately that she was developing weakness in the tibialis anterior group the MRI was performed recently and this demonstrates the presence of a large extruded fragment of disc at L4-L5 on the left side. She is advised regarding surgical decompression of this process. She is now admitted for surgery.  Past Medical History  Diagnosis Date  . Scoliosis   . Obesity   . Migraine   . Gallstones   . Asthma   . Arthritis   . Pelvic inflammatory disease (PID) 05-08-11    previous hx. .Hx. childbirth x3-NVD  . Leukocytosis     going to hematologist  . History of renal failure   . Edema   . History of anemia   . Cancer   . Cervical cancer   . Anxiety     Panic attack  . DDD (degenerative disc disease), cervical   . PTSD (post-traumatic stress disorder)   . GERD (gastroesophageal reflux disease)   . Constipation   . PID (pelvic inflammatory disease)   . HPV (human papilloma virus) anogenital infection     Past Surgical History  Procedure Laterality Date  . Abdominal exploration surgery  approx 28 years old    rlq(due to adhesions around ovaries)-appendix and ovaries remain  . Cholecystectomy  05/10/2011    Procedure: LAPAROSCOPIC CHOLECYSTECTOMY WITH INTRAOPERATIVE CHOLANGIOGRAM;  Surgeon: Gayland Curry, MD,FACS;  Location: [facility];  Service: General;  Laterality: N/A;  . Anterior cervical decomp/discectomy fusion N/A 12/09/2012    Procedure: ANTERIOR CERVICAL DECOMPRESSION/DISCECTOMY FUSION STRUCTURAL ALLOGRAFT TRESTLE PLATE CERVICAL FIVE-SIX,SIX-SEVEN;  Surgeon: Otilio Connors, MD;  Location: [facility];  Service:  Neurosurgery;  Laterality: N/A;    Family History  Problem Relation Age of Onset  . Prostate cancer Maternal Grandfather   . Cancer Maternal Grandfather     prostate  . Colon polyps Maternal Grandmother   . Diabetes Mother   . Endometriosis Mother   . Cancer Mother     lung  . Diabetes Father   . Heart disease Father   . Cirrhosis Paternal Grandfather    Social History:  reports that she has been smoking Cigarettes.  She has a 3.96 pack-year smoking history. She has never used smokeless tobacco. She reports that she uses illicit drugs (Marijuana). She reports that she does not drink alcohol.  Allergies:  Allergies  Allergen Reactions  . Banana Other (See Comments)      Itchy throat  . Orange Oil Other (See Comments)    Mouth pain and acid reflux  . Latex Itching and Rash    Red spots    Medications Prior to Admission  Medication Sig Dispense Refill  . ALPRAZolam (XANAX) 0.5 MG tablet Take 1 mg by mouth 2 (two) times daily.     . ARIPiprazole (ABILIFY) 5 MG tablet Take 5 mg by mouth daily.    . busPIRone (BUSPAR) 15 MG tablet Take 15 mg by mouth 2 (two) times daily.    . cyclobenzaprine (FLEXERIL) 10 MG tablet Take 1 tablet (10 mg total) by mouth 2 (two) times daily as needed for muscle spasms. 20 tablet 0  .  docusate sodium (COLACE) 100 MG capsule Take 100 mg by mouth at bedtime as needed (constipation).     Marland Kitchen esomeprazole (NEXIUM) 40 MG capsule Take 40 mg by mouth daily.     . fluconazole (DIFLUCAN) 150 MG tablet Take 150 mg by mouth daily.    Marland Kitchen FLUoxetine (PROZAC) 20 MG capsule Take 80 mg by mouth daily.     . prazosin (MINIPRESS) 2 MG capsule Take 2 mg by mouth daily.    . pregabalin (LYRICA) 150 MG capsule Take 150 mg by mouth 2 (two) times daily.     . traZODone (DESYREL) 50 MG tablet Take 50 mg by mouth daily.     Marland Kitchen trolamine salicylate (ASPERCREME) 10 % cream Apply 1 application topically 2 (two) times daily as needed for muscle pain.    Marland Kitchen zolpidem (AMBIEN) 10 MG  tablet Take 10 mg by mouth at bedtime as needed for sleep.    . ferrous sulfate 325 (65 FE) MG tablet Take 325 mg by mouth daily with breakfast.      Results for orders placed or performed during the hospital encounter of 11/30/14 (from the past 48 hour(s))  CBC     Status: Abnormal   Collection Time: 11/30/14  7:55 AM  Result Value Ref Range   WBC 13.0 (H) 4.0 - 10.5 K/uL   RBC 4.56 3.87 - 5.11 MIL/uL   Hemoglobin 11.4 (L) 12.0 - 15.0 g/dL   HCT 35.9 (L) 36.0 - 46.0 %   MCV 78.7 78.0 - 100.0 fL   MCH 25.0 (L) 26.0 - 34.0 pg   MCHC 31.8 30.0 - 36.0 g/dL   RDW 18.7 (H) 11.5 - 15.5 %   Platelets 273 150 - 400 K/uL  hCG, serum, qualitative     Status: None   Collection Time: 11/30/14  7:55 AM  Result Value Ref Range   Preg, Serum NEGATIVE NEGATIVE    Comment:        THE SENSITIVITY OF THIS METHODOLOGY IS >10 mIU/mL.   Comprehensive metabolic panel     Status: Abnormal   Collection Time: 11/30/14  7:55 AM  Result Value Ref Range   Sodium 138 135 - 145 mmol/L   Potassium 3.7 3.5 - 5.1 mmol/L   Chloride 107 101 - 111 mmol/L   CO2 23 22 - 32 mmol/L   Glucose, Bld 85 65 - 99 mg/dL   BUN 10 6 - 20 mg/dL   Creatinine, Ser 0.98 0.44 - 1.00 mg/dL   Calcium 8.8 (L) 8.9 - 10.3 mg/dL   Total Protein 6.4 (L) 6.5 - 8.1 g/dL   Albumin 3.4 (L) 3.5 - 5.0 g/dL   AST 20 15 - 41 U/L   ALT 18 14 - 54 U/L   Alkaline Phosphatase 73 38 - 126 U/L   Total Bilirubin 0.3 0.3 - 1.2 mg/dL   GFR calc non Af Amer >60 >60 mL/min   GFR calc Af Amer >60 >60 mL/min    Comment: (NOTE) The eGFR has been calculated using the CKD EPI equation. This calculation has not been validated in all clinical situations. eGFR's persistently <60 mL/min signify possible Chronic Kidney Disease.    Anion gap 8 5 - 15   No results found.  Review of Systems  HENT: Negative.   Eyes: Negative.   Respiratory: Negative.   Cardiovascular: Negative.   Gastrointestinal: Negative.   Genitourinary: Negative.    Musculoskeletal: Positive for back pain.  Skin: Negative.   Neurological: Positive for sensory  change, focal weakness and weakness.  Endo/Heme/Allergies: Negative.   Psychiatric/Behavioral: Negative.     Blood pressure 96/69, pulse 86, temperature 97.6 F (36.4 C), temperature source Oral, resp. rate 18, height 5' 3" (1.6 m), weight 117.141 kg (258 lb 4 oz), last menstrual period 10/03/2014, SpO2 97 %. Physical Exam  Constitutional: She is oriented to person, place, and time. She appears well-developed and well-nourished.  HENT:  Head: Normocephalic and atraumatic.  Eyes: Conjunctivae and EOM are normal. Pupils are equal, round, and reactive to light.  Neck: Normal range of motion. Neck supple.  Cardiovascular: Regular rhythm.   Respiratory: Effort normal and breath sounds normal.  GI: Soft. Bowel sounds are normal.  Musculoskeletal:  Moderate paravertebral spasm in the lumbar spine. Positive straight leg raising bilaterally at 15. Moderate weakness in tibialis anterior group on left  Neurological: She is alert and oriented to person, place, and time.  Skin: Skin is warm and dry.  Psychiatric: She has a normal mood and affect. Her behavior is normal. Judgment and thought content normal.     Assessment/Plan Herniated nucleus pulposus L4-5 left with radiculopathy.  Surgical decompression L4-5 left with microdiscectomy.  , J 11/30/2014, 9:38 AM    

## 2014-11-30 NOTE — Op Note (Signed)
Date of surgery: 11/30/2014 Preoperative diagnosis: Herniated nucleus pulposus L4-5 left with left L5 radiculopathy Postoperative diagnosis: Same Procedure: Microdiscectomy L4-5 left with operating microscope microdissection technique Surgeon: Kristeen Miss M.D. First assistant: Earnie Larsson M.D. Anesthesia: Gen. endotracheal Indications: [name] is a 29 year old individual who has had a herniated nucleus pulposus with a severe left lumbar radiculopathy for over a year's period of time because of her Medicaid restrictions she was not able to get an MRI of her lumbar spine until recently. She's been experiencing weakness in her left tibialis anterior and has severe pain in the L5 distribution. MRI demonstrates a large extruded fragment of disc at L4-5 with a fragment of disc behind the vertebral body of L5. She's been advised regarding the need for surgery via left microdiscectomy L4-5.  Procedure: Patient was brought to the operating room supine on a stretcher after the smooth induction of general endotracheal anesthesia she was turned prone. She is morbidly obese and required substantial padding and protection in order to liner completely prone. The back was prepped with out on DuraPrep and draped in a sterile fashion. Midline incision was made above the level of L4-5 which was localized with a needle down to the paraspinous fascia. 10 mL of lidocaine 1% with 1-100,000 epinephrine mixed 5050 with half percent Marcaine was injected into this region prior to the incision. The lumbodorsal fascia was identified and opened on the left side of the midline. A subperiosteal dissection was performed at L4-5. A second localizing radiograph confirmed the L4-5 site on the left side. A laminotomy was then created removing the inferior marginal lamina of L4 up to and including portion of the facet joint at L4-5 on the left. He'll ligament was taken. The underlying dura was identified. By dissecting on the lateral  aspect of the dura could be gradually mobilized of a significant mass which was densely fibrotic and also rather adherent to the dura. The dissection was proceeded with carefully and when the ligament was identified the operating microscope was brought into the surgical site to better identify the structures a small vertical incision was created and underneath this significant amount of severely degenerated and scarred disc material was encountered which was removed from the  with some difficulty as it was adherent to all the surrounding structures. The disc space was identified. The disc space was entered and a significant quantity of further degenerated disc material was encountered and this was also removed. The site under the L5 nerve root was carefully dissected free of several large fragments of disc and this allowed for more mobilization of the L5 nerve root. However was felt that there was still significant amount of disc material behind the vertebral body of L5. Laminotomy was in increased inferiorly to expose the common dural tube in the axilla of the L5 nerve root. Here several other fragments of disc material again densely adherent to the surrounding structures were encountered. The discectomy was completed here and this allowed good freedom of the common dural tube and the L5 nerve root. Once this was completed the area was inspected and hemostasis was obtained in the epidural spaces. Surgifoam was used for this purpose after irrigating the wound copiously and identifying the common dural tube the L5 nerve root was all well decompressed the microscope was removed and then the lumbar dorsal fascia was closed with #1 Vicryls in interrupted fashion. 20 Vicryls used in the subcutaneous tissues. 30 Vicryls used to close the subcuticular skin. 20 mL of half percent  Marcaine was injected into the paraspinous fascia before closure. The patient tolerated procedure well. Blood loss was estimated at 75 mL.

## 2014-11-30 NOTE — Anesthesia Procedure Notes (Signed)
Procedure Name: Intubation Date/Time: 11/30/2014 10:01 AM Performed by: Greggory Stallion, Maleak Brazzel L Pre-anesthesia Checklist: Patient identified, Emergency Drugs available, Suction available, Patient being monitored and Timeout performed Patient Re-evaluated:Patient Re-evaluated prior to inductionOxygen Delivery Method: Circle system utilized Preoxygenation: Pre-oxygenation with 100% oxygen Intubation Type: IV induction Ventilation: Mask ventilation without difficulty Laryngoscope Size: Mac and 3 Grade View: Grade I Tube type: Oral Tube size: 7.5 mm Number of attempts: 1 Airway Equipment and Method: Stylet Placement Confirmation: ETT inserted through vocal cords under direct vision,  positive ETCO2 and breath sounds checked- equal and bilateral Secured at: 20 cm Tube secured with: Tape Dental Injury: Teeth and Oropharynx as per pre-operative assessment

## 2014-11-30 NOTE — Progress Notes (Signed)
Pt. Reports that she is out of some of her medicines, states that she doesn't have the money to pick them up from pharmacy.

## 2014-11-30 NOTE — Transfer of Care (Signed)
Immediate Anesthesia Transfer of Care Note  Patient: [name]  Procedure(s) Performed: Procedure(s): Left lumbar four-five Microdiskectomy (Left)  Patient Location: PACU  Anesthesia Type:General  Level of Consciousness: awake, alert , oriented and patient cooperative  Airway & Oxygen Therapy: Patient Spontanous Breathing and Patient connected to nasal cannula oxygen  Post-op Assessment: Report given to RN, Post -op Vital signs reviewed and stable and Patient moving all extremities  Post vital signs: Reviewed and stable  Last Vitals:  Filed Vitals:   11/30/14 1201  BP: 116/47  Pulse: 81  Temp: 36.9 C  Resp: 30    Complications: No apparent anesthesia complications

## 2014-11-30 NOTE — Evaluation (Signed)
Physical Therapy Evaluation Patient Details Name: [name] MRN: [id number] DOB: [birth year] Today's Date: 11/30/2014   History of Present Illness  Pt is a 29 y/o female presenting s/p L4-5 microdiscectomy on 11/30/14.  Clinical Impression  Pt admitted with above diagnosis. Pt currently with functional limitations due to the deficits listed below (see PT Problem List). At the time of PT eval pt was able to ambulate ~150 feet with min guard assist. Pt holding onto IV pole for support, however declines use of AD and will need to be able to walk without UE support and be successful on the stairs prior to d/c. Pt will benefit from skilled PT to increase their independence and safety with mobility to allow discharge to the venue listed below.       Follow Up Recommendations No PT follow up    Equipment Recommendations  None recommended by PT    Recommendations for Other Services       Precautions / Restrictions Precautions Precautions: Fall;Back Precaution Booklet Issued: Yes (comment) Precaution Comments: Needs reinforcement Restrictions Weight Bearing Restrictions: No      Mobility  Bed Mobility Overal bed mobility: Needs Assistance Bed Mobility: Rolling;Sidelying to Sit Rolling: Min guard Sidelying to sit: Min guard       General bed mobility comments: VC's for log roll technique.   Transfers Overall transfer level: Needs assistance Equipment used: None Transfers: Sit to/from Stand Sit to Stand: Min guard         General transfer comment: VC's to maintain back precautions. No physical assist required.   Ambulation/Gait Ambulation/Gait assistance: Min guard Ambulation Distance (Feet): 150 Feet Assistive device: None (Pushed IV pole) Gait Pattern/deviations: Step-through pattern;Decreased stride length;Wide base of support Gait velocity: Decreased Gait velocity interpretation: Below normal speed for age/gender General Gait Details: 2 standing rest breaks due to  pain and fatigue. Pt reports dizziness at end of gait training. Overall ambulating well with UE support on IV pole. Pt declines use of RW.  Stairs            Wheelchair Mobility    Modified Rankin (Stroke Patients Only)       Balance Overall balance assessment: No apparent balance deficits (not formally assessed)                                           Pertinent Vitals/Pain Pain Assessment: 0-10 Pain Score: 8  Pain Location: Incision Pain Descriptors / Indicators: Operative site guarding Pain Intervention(s): Limited activity within patient's tolerance;Monitored during session;Repositioned    Home Living Family/patient expects to be discharged to:: Private residence Living Arrangements: Spouse/significant other Available Help at Discharge: Family;Available 24 hours/day Type of Home: House Home Access: Stairs to enter   [employer] of Steps: 2 Home Layout: Two level Home Equipment: Cane - single point      Prior Function Level of Independence: Independent               Hand Dominance   Dominant Hand: Right    Extremity/Trunk Assessment   Upper Extremity Assessment: Defer to OT evaluation;Overall WFL for tasks assessed           Lower Extremity Assessment: Generalized weakness      Cervical / Trunk Assessment: Normal  Communication   Communication: No difficulties  Cognition Arousal/Alertness: Awake/alert Behavior During Therapy: WFL for tasks assessed/performed Overall Cognitive Status: Within Functional Limits for  tasks assessed                      General Comments      Exercises        Assessment/Plan    PT Assessment Patient needs continued PT services  PT Diagnosis Difficulty walking;Acute pain   PT Problem List Decreased strength;Decreased range of motion;Decreased activity tolerance;Decreased balance;Decreased mobility;Decreased knowledge of use of DME;Decreased safety  awareness;Decreased knowledge of precautions;Pain  PT Treatment Interventions DME instruction;Gait training;Stair training;Functional mobility training;Therapeutic activities;Therapeutic exercise;Neuromuscular re-education;Patient/family education   PT Goals (Current goals can be found in the Care Plan section) Acute Rehab PT Goals Patient Stated Goal: Home PT Goal Formulation: With patient/family Time For Goal Achievement: 12/07/14 Potential to Achieve Goals: Good    Frequency Min 5X/week   Barriers to discharge        Co-evaluation               End of Session   Activity Tolerance: Patient tolerated treatment well Patient left: in chair;with call bell/phone within reach;with family/visitor present Nurse Communication: Mobility status    Functional Assessment Tool Used: Clinical judgement Functional Limitation: Mobility: Walking and moving around Mobility: Walking and Moving Around Current Status (F6812): At least 1 percent but less than 20 percent impaired, limited or restricted Mobility: Walking and Moving Around Goal Status [phone]): At least 1 percent but less than 20 percent impaired, limited or restricted    Time: 1435-1458 PT Time Calculation (min) (ACUTE ONLY): 23 min   Charges:   PT Evaluation $Initial PT Evaluation Tier I: 1 Procedure PT Treatments $Gait Training: 8-22 mins   PT G Codes:   PT G-Codes **NOT FOR INPATIENT CLASS** Functional Assessment Tool Used: Clinical judgement Functional Limitation: Mobility: Walking and moving around Mobility: Walking and Moving Around Current Status (Y1749): At least 1 percent but less than 20 percent impaired, limited or restricted Mobility: Walking and Moving Around Goal Status [phone]): At least 1 percent but less than 20 percent impaired, limited or restricted    [name] 11/30/2014, 3:09 PM  [name], PT, DPT Acute Rehabilitation Services Pager: [phone]

## 2014-11-30 NOTE — Anesthesia Postprocedure Evaluation (Signed)
  Anesthesia Post-op Note  Patient: [name]  Procedure(s) Performed: Procedure(s) (LRB): Left lumbar four-five Microdiskectomy (Left)  Patient Location: PACU  Anesthesia Type: General  Level of Consciousness: awake and alert   Airway and Oxygen Therapy: Patient Spontanous Breathing  Post-op Pain: mild  Post-op Assessment: Post-op Vital signs reviewed, Patient's Cardiovascular Status Stable, Respiratory Function Stable, Patent Airway and No signs of Nausea or vomiting  Last Vitals:  Filed Vitals:   11/30/14 1254  BP: 99/57  Pulse:   Temp: 36.6 C  Resp:     Post-op Vital Signs: stable   Complications: No apparent anesthesia complications

## 2014-12-01 ENCOUNTER — Encounter (HOSPITAL_COMMUNITY): Payer: Self-pay | Admitting: Neurological Surgery

## 2014-12-01 DIAGNOSIS — M5126 Other intervertebral disc displacement, lumbar region: Secondary | ICD-10-CM | POA: Diagnosis not present

## 2014-12-01 MED ORDER — DIAZEPAM 5 MG PO TABS: 5.0000 mg | ORAL_TABLET | Freq: Four times a day (QID) | ORAL | Status: DC | PRN

## 2014-12-01 MED ORDER — OXYCODONE-ACETAMINOPHEN 5-325 MG PO TABS: 1.0000 | ORAL_TABLET | ORAL | Status: DC | PRN

## 2014-12-01 NOTE — Progress Notes (Signed)
Pt doing well. Pt given D/C instructions with Rx's, verbal understanding was provided. Pt's incision is clean and dry with no sign of infection. Pt's IV was removed prior to D/C. PT D/C'd home via wheelchair @ 1125 per MD order. Pt is stable @ D/C. Pt requested to speak to SW about assistance @ home and a CM to assist her with medications. Pt refused to wait any longer to speak to them. Holli Humbles, RN

## 2014-12-01 NOTE — Progress Notes (Signed)
Physical Therapy Treatment Patient Details Name: [name] MRN: [id number] DOB: May 11, 1985 Today's Date: 12/01/2014    History of Present Illness Pt is a 29 y/o female presenting s/p L4-5 microdiscectomy on 11/30/14.    PT Comments    Pt progressing towards physical therapy goals. Was able to negotiate 10 stairs with min guard assist and VC's for safety awareness. Appears pt has good social support at home. Reviewed back precautions and safety for home mobility. Pt anticipates d/c home today.   Follow Up Recommendations  No PT follow up     Equipment Recommendations  None recommended by PT    Recommendations for Other Services       Precautions / Restrictions Precautions Precautions: Fall;Back Precaution Booklet Issued: Yes (comment) Precaution Comments: Pt able to recall 3/3 back precautions Restrictions Weight Bearing Restrictions: No    Mobility  Bed Mobility               General bed mobility comments: Pt sitting on EOB when PT arrived.   Transfers Overall transfer level: Needs assistance Equipment used: None Transfers: Sit to/from Stand Sit to Stand: Supervision         General transfer comment: No physical assist required. Supervision for safety.   Ambulation/Gait Ambulation/Gait assistance: Supervision Ambulation Distance (Feet): 400 Feet Assistive device: None (Pushed IV pole) Gait Pattern/deviations: Step-through pattern;Decreased stride length Gait velocity: Decreased Gait velocity interpretation: Below normal speed for age/gender General Gait Details: Close supervision for safety. Pt was able to ambulate well however slow.    Stairs Stairs: Yes Stairs assistance: Min guard Stair Management: One rail Left;Forwards;Step to pattern Number of Stairs: 10 General stair comments: VC's for sequencing and general safety awareness.   Wheelchair Mobility    Modified Rankin (Stroke Patients Only)       Balance Overall balance assessment:  Needs assistance Sitting-balance support: Feet supported;No upper extremity supported Sitting balance-Leahy Scale: Fair     Standing balance support: No upper extremity supported;During functional activity Standing balance-Leahy Scale: Fair                      Cognition Arousal/Alertness: Awake/alert Behavior During Therapy: WFL for tasks assessed/performed Overall Cognitive Status: Within Functional Limits for tasks assessed                      Exercises      General Comments        Pertinent Vitals/Pain Pain Assessment: 0-10 Pain Score: 8  Pain Location: Incision Pain Descriptors / Indicators: Aching;Operative site guarding Pain Intervention(s): Limited activity within patient's tolerance;Monitored during session;Repositioned    Home Living                      Prior Function            PT Goals (current goals can now be found in the care plan section) Acute Rehab PT Goals Patient Stated Goal: Home PT Goal Formulation: With patient/family Time For Goal Achievement: 12/07/14 Potential to Achieve Goals: Good Progress towards PT goals: Progressing toward goals    Frequency  Min 5X/week    PT Plan Current plan remains appropriate    Co-evaluation             End of Session Equipment Utilized During Treatment: Gait belt Activity Tolerance: Patient tolerated treatment well Patient left: in chair;with call bell/phone within reach;with family/visitor present     Time: 5956-3875 PT Time Calculation (min) (ACUTE ONLY):  20 min  Charges:  $Gait Training: 8-22 mins                    G Codes:      [name] 2014-12-06, 8:45 AM   [name], PT, DPT Acute Rehabilitation Services Pager: [phone]

## 2014-12-01 NOTE — Discharge Instructions (Signed)

## 2014-12-01 NOTE — Evaluation (Signed)
Occupational Therapy Evaluation Patient Details Name: [name] MRN: [id number] DOB: 1985-07-27 Today's Date: 12/01/2014    History of Present Illness Pt is a 29 y/o female presenting s/p L4-5 microdiscectomy on 11/30/14.   Clinical Impression   Pt educated in back precautions related to ADL and IADL, pt verbalizing understanding of all. Pt performed transfers and bed mobility at a modified independent level.  No further OT needs.    Follow Up Recommendations  No OT follow up    Equipment Recommendations  None recommended by OT    Recommendations for Other Services       Precautions / Restrictions Precautions Precautions: Fall;Back Precaution Booklet Issued: Yes (comment) Precaution Comments: reinforced back precautions Restrictions Weight Bearing Restrictions: No      Mobility Bed Mobility   Bed Mobility: Rolling;Sidelying to Sit;Sit to Sidelying Rolling: Supervision Sidelying to sit: Supervision     Sit to sidelying: Supervision General bed mobility comments: verbal cues for technique, no physical assist  Transfers Overall transfer level: Needs assistance Equipment used: None Transfers: Sit to/from Stand Sit to Stand: Modified independent (Device/Increase time)         General transfer comment: modified independent from toilet, simulated tub and bed    Balance Overall balance assessment: Needs assistance Sitting-balance support: Feet supported;No upper extremity supported Sitting balance-Leahy Scale: Fair     Standing balance support: No upper extremity supported;During functional activity Standing balance-Leahy Scale: Fair                              ADL Overall ADL's : Modified independent                                       General ADL Comments: Pt able to bring LE up onto bed to reach socks. Recommended long handled sponge. Instructed pt in tub transfer, Educated in safe footwear. Educated in two cup method  for toothbrushing.     Vision     Perception     Praxis      Pertinent Vitals/Pain Pain Assessment: Faces Pain Score: 8  Faces Pain Scale: Hurts whole lot Pain Location: back with bed mobility Pain Descriptors / Indicators: Operative site guarding;Guarding;Grimacing Pain Intervention(s): Monitored during session;Repositioned;Limited activity within patient's tolerance     Hand Dominance Right   Extremity/Trunk Assessment Upper Extremity Assessment Upper Extremity Assessment: Overall WFL for tasks assessed   Lower Extremity Assessment Lower Extremity Assessment: Overall WFL for tasks assessed       Communication Communication Communication: No difficulties   Cognition Arousal/Alertness: Awake/alert Behavior During Therapy: WFL for tasks assessed/performed Overall Cognitive Status: Within Functional Limits for tasks assessed                     General Comments       Exercises       Shoulder Instructions      Home Living Family/patient expects to be discharged to:: Private residence Living Arrangements: Spouse/significant other Available Help at Discharge: Family;Available 24 hours/day Type of Home: Apartment Home Access: Stairs to enter Entrance Stairs-Number of Steps: 2   Home Layout: Two level Alternate Level Stairs-Number of Steps: Flight   Bathroom Shower/Tub: Teacher, early years/pre: Standard     Home Equipment: Cane - single point          Prior Functioning/Environment Level of  Independence: Needs assistance    ADL's / Homemaking Assistance Needed: family helps with lotion on feet and sometimes socks, pt primarily wears flip flop        OT Diagnosis:     OT Problem List:     OT Treatment/Interventions:      OT Goals(Current goals can be found in the care plan section) Acute Rehab OT Goals Patient Stated Goal: Home  OT Frequency:     Barriers to D/C:            Co-evaluation              End of  Session    Activity Tolerance: Patient tolerated treatment well Patient left:  (walking in unit)   Time: 1027-1040 OT Time Calculation (min): 13 min Charges:  OT General Charges $OT Visit: 1 Procedure OT Evaluation $Initial OT Evaluation Tier I: 1 Procedure G-Codes: OT G-codes **NOT FOR INPATIENT CLASS** Functional Assessment Tool Used: clinical judgement Functional Limitation: Self care Self Care Current Status (S9628): At least 1 percent but less than 20 percent impaired, limited or restricted Self Care Goal Status (Z6629): At least 1 percent but less than 20 percent impaired, limited or restricted Self Care Discharge Status [phone]): At least 1 percent but less than 20 percent impaired, limited or restricted  Malka So 12/01/2014, 11:24 AM  [phone]

## 2014-12-01 NOTE — Discharge Summary (Signed)
Physician Discharge Summary  Patient ID: [name] MRN: [id number] DOB/AGE: [birth year] 30 y.o.  Admit date: 11/30/2014 Discharge date: 12/01/2014  Admission Diagnoses: Herniated nucleus pulposus L4-5 left with left lumbar radiculopathy morbid obesity  Discharge Diagnoses: Herniated nucleus pulposus L4-5 left with left lumbar radiculopathy, morbid obesity  Active Problems:   Herniated nucleus pulposus, L4-5 left   Discharged Condition: good  Hospital Course: Patient was admitted to undergo surgical microdiscectomy at L4-5 left. She had good relief of her left leg pain in her motor function remains intact.  Consults: None  Significant Diagnostic Studies: None  Treatments: surgery: Microdiscectomy L4-5 left with operating microscope dissection technique  Discharge Exam: Blood pressure 134/73, pulse 94, temperature 98.1 F (36.7 C), temperature source Oral, resp. rate 18, height 5\' 3"  (1.6 m), weight 117.141 kg (258 lb 4 oz), last menstrual period 10/03/2014, SpO2 97 %. Incision is clean and dry, motor function is intact in lower extremities.  Disposition: 01-Home or Self Care  Discharge Instructions    Call MD for:  redness, tenderness, or signs of infection (pain, swelling, redness, odor or green/yellow discharge around incision site)    Complete by:  As directed      Call MD for:  severe uncontrolled pain    Complete by:  As directed      Call MD for:  temperature >100.4    Complete by:  As directed      Diet - low sodium heart healthy    Complete by:  As directed      Discharge instructions    Complete by:  As directed   Okay to shower. Do not apply salves or appointments to incision. No heavy lifting with the upper extremities greater than 15 pounds. May resume driving when not requiring pain medication and patient feels comfortable with doing so.     Increase activity slowly    Complete by:  As directed             Medication List    TAKE these medications         ALPRAZolam 0.5 MG tablet  Commonly known as:  XANAX  Take 1 mg by mouth 2 (two) times daily.     ARIPiprazole 5 MG tablet  Commonly known as:  ABILIFY  Take 5 mg by mouth daily.     busPIRone 15 MG tablet  Commonly known as:  BUSPAR  Take 15 mg by mouth 2 (two) times daily.     cyclobenzaprine 10 MG tablet  Commonly known as:  FLEXERIL  Take 1 tablet (10 mg total) by mouth 2 (two) times daily as needed for muscle spasms.     diazepam 5 MG tablet  Commonly known as:  VALIUM  Take 1 tablet (5 mg total) by mouth every 6 (six) hours as needed for muscle spasms.     docusate sodium 100 MG capsule  Commonly known as:  COLACE  Take 100 mg by mouth at bedtime as needed (constipation).     esomeprazole 40 MG capsule  Commonly known as:  NEXIUM  Take 40 mg by mouth daily.     ferrous sulfate 325 (65 FE) MG tablet  Take 325 mg by mouth daily with breakfast.     fluconazole 150 MG tablet  Commonly known as:  DIFLUCAN  Take 150 mg by mouth daily.     FLUoxetine 20 MG capsule  Commonly known as:  PROZAC  Take 80 mg by mouth daily.     oxyCODONE-acetaminophen 5-325  MG per tablet  Commonly known as:  PERCOCET/ROXICET  Take 1-2 tablets by mouth every 4 (four) hours as needed for moderate pain.     prazosin 2 MG capsule  Commonly known as:  MINIPRESS  Take 2 mg by mouth daily.     pregabalin 150 MG capsule  Commonly known as:  LYRICA  Take 150 mg by mouth 2 (two) times daily.     traZODone 50 MG tablet  Commonly known as:  DESYREL  Take 50 mg by mouth daily.     trolamine salicylate 10 % cream  Commonly known as:  ASPERCREME  Apply 1 application topically 2 (two) times daily as needed for muscle pain.     zolpidem 10 MG tablet  Commonly known as:  AMBIEN  Take 10 mg by mouth at bedtime as needed for sleep.         SignedEarleen Newport 12/01/2014, 9:01 AM

## 2014-12-05 ENCOUNTER — Emergency Department (HOSPITAL_COMMUNITY)
Admission: EM | Admit: 2014-12-05 | Discharge: 2014-12-05 | Disposition: A | Discharge status: Home / Self Care | Payer: Medicaid Other | Attending: Emergency Medicine | Admitting: Emergency Medicine

## 2014-12-05 ENCOUNTER — Encounter (HOSPITAL_COMMUNITY): Payer: Self-pay | Admitting: *Deleted

## 2014-12-05 DIAGNOSIS — Z8541 Personal history of malignant neoplasm of cervix uteri: Secondary | ICD-10-CM | POA: Diagnosis not present

## 2014-12-05 DIAGNOSIS — J45909 Unspecified asthma, uncomplicated: Secondary | ICD-10-CM | POA: Insufficient documentation

## 2014-12-05 DIAGNOSIS — M419 Scoliosis, unspecified: Secondary | ICD-10-CM | POA: Diagnosis not present

## 2014-12-05 DIAGNOSIS — F410 Panic disorder [episodic paroxysmal anxiety] without agoraphobia: Secondary | ICD-10-CM | POA: Diagnosis not present

## 2014-12-05 DIAGNOSIS — M545 Low back pain: Secondary | ICD-10-CM | POA: Diagnosis not present

## 2014-12-05 DIAGNOSIS — Z862 Personal history of diseases of the blood and blood-forming organs and certain disorders involving the immune mechanism: Secondary | ICD-10-CM | POA: Insufficient documentation

## 2014-12-05 DIAGNOSIS — Z8619 Personal history of other infectious and parasitic diseases: Secondary | ICD-10-CM | POA: Insufficient documentation

## 2014-12-05 DIAGNOSIS — G43909 Migraine, unspecified, not intractable, without status migrainosus: Secondary | ICD-10-CM | POA: Diagnosis not present

## 2014-12-05 DIAGNOSIS — K219 Gastro-esophageal reflux disease without esophagitis: Secondary | ICD-10-CM | POA: Diagnosis not present

## 2014-12-05 DIAGNOSIS — Z79899 Other long term (current) drug therapy: Secondary | ICD-10-CM | POA: Insufficient documentation

## 2014-12-05 DIAGNOSIS — E669 Obesity, unspecified: Secondary | ICD-10-CM | POA: Diagnosis not present

## 2014-12-05 DIAGNOSIS — Z720 Tobacco use: Secondary | ICD-10-CM | POA: Diagnosis not present

## 2014-12-05 DIAGNOSIS — Z91040 Latex allergy status: Secondary | ICD-10-CM | POA: Insufficient documentation

## 2014-12-05 DIAGNOSIS — Z8742 Personal history of other diseases of the female genital tract: Secondary | ICD-10-CM | POA: Diagnosis not present

## 2014-12-05 LAB — URINALYSIS, ROUTINE W REFLEX MICROSCOPIC
Bilirubin Urine: NEGATIVE
GLUCOSE, UA: NEGATIVE mg/dL
HGB URINE DIPSTICK: NEGATIVE
Ketones, ur: NEGATIVE mg/dL
Leukocytes, UA: NEGATIVE
Nitrite: NEGATIVE
PROTEIN: NEGATIVE mg/dL
Specific Gravity, Urine: 1.013 (ref 1.005–1.030)
Urobilinogen, UA: 0.2 mg/dL (ref 0.0–1.0)
pH: 7.5 (ref 5.0–8.0)

## 2014-12-05 MED ORDER — KETOROLAC TROMETHAMINE 60 MG/2ML IM SOLN
60.0000 mg | Freq: Once | INTRAMUSCULAR | Status: AC
  Administered 2014-12-05: 60 mg via INTRAMUSCULAR
  Filled 2014-12-05: qty 2

## 2014-12-05 MED ORDER — KETOROLAC TROMETHAMINE 30 MG/ML IJ SOLN
30.0000 mg | Freq: Once | INTRAMUSCULAR | Status: DC
  Filled 2014-12-05: qty 1

## 2014-12-05 MED ORDER — KETOROLAC TROMETHAMINE 10 MG PO TABS: 10.0000 mg | ORAL_TABLET | Freq: Four times a day (QID) | ORAL | Status: DC | PRN

## 2014-12-05 NOTE — Discharge Instructions (Signed)
You were evaluated in the ED today for your low back pain. There does not appear to be an emergent cause for your symptoms at this time. There is no evidence of infection. Your urinalysis was also reassuring. Please take your medications as prescribed. Follow-up with your surgeon for reevaluation in 2 days as needed. Return to ED for worsening symptoms.

## 2014-12-05 NOTE — ED Notes (Signed)
L3L4L5 repair on Tuesday at [facility], continues to have pain, incision site looks well, upper ext numbness that is not new due to pinched nerve.

## 2014-12-05 NOTE — ED Notes (Signed)
Bed: [facility] Expected date: 12/05/14 Expected time: 2:30 PM Means of arrival: Ambulance Comments: Post op Back Pain

## 2014-12-05 NOTE — ED Provider Notes (Signed)
CSN: [id number]     Arrival date & time 12/05/14  1430 History   First MD Initiated Contact with Patient 12/05/14 1504     Chief Complaint  Patient presents with  . Back Pain     (Consider location/radiation/quality/duration/timing/severity/associated sxs/prior Treatment) HPI [name] is a 29 y.o. female with morbid obesity, recent L4-L5 microdiscectomy for HNP on 9/6, comes in for evaluation of low back pain. Patient states she has had increasingly worse right low back and hip pain. Characterized as sharp shooting pain, worse with movement. She has been taking oxycodone without relief. Did not take oxycodone today, instead took naproxen and Tylenol, but this also did not provide relief. She denies fevers, chills, urinary incontinence, numbness or weakness. Although urination will exacerbate hip pain. She does report constipation, last bowel movement 2 days ago and was normal for her. She denies any redness or swelling or drainage/discharge from her surgical site. Rates her discomfort now as 9/10. No other aggravating or modifying factors.  Past Medical History  Diagnosis Date  . Scoliosis   . Obesity   . Migraine   . Gallstones   . Asthma   . Arthritis   . Pelvic inflammatory disease (PID) 05-08-11    previous hx. .Hx. childbirth x3-NVD  . Leukocytosis     going to hematologist  . History of renal failure   . Edema   . History of anemia   . Cancer   . Cervical cancer   . Anxiety     Panic attack  . DDD (degenerative disc disease), cervical   . PTSD (post-traumatic stress disorder)   . GERD (gastroesophageal reflux disease)   . Constipation   . PID (pelvic inflammatory disease)   . HPV (human papilloma virus) anogenital infection    Past Surgical History  Procedure Laterality Date  . Abdominal exploration surgery  approx 29 years old    rlq(due to adhesions around ovaries)-appendix and ovaries remain  . Cholecystectomy  05/10/2011    Procedure: LAPAROSCOPIC  CHOLECYSTECTOMY WITH INTRAOPERATIVE CHOLANGIOGRAM;  Surgeon: Gayland Curry, MD,FACS;  Location: [facility] ORS;  Service: General;  Laterality: N/A;  . Anterior cervical decomp/discectomy fusion N/A 12/09/2012    Procedure: ANTERIOR CERVICAL DECOMPRESSION/DISCECTOMY FUSION STRUCTURAL ALLOGRAFT TRESTLE PLATE CERVICAL FIVE-SIX,SIX-SEVEN;  Surgeon: Otilio Connors, MD;  Location: [place] [facility];  Service: Neurosurgery;  Laterality: N/A;  . Lumbar laminectomy/decompression microdiscectomy Left 11/30/2014    Procedure: Left lumbar four-five Microdiskectomy;  Surgeon: Kristeen Miss, MD;  Location: [place] [facility];  Service: Neurosurgery;  Laterality: Left;   Family History  Problem Relation Age of Onset  . Prostate cancer Maternal Grandfather   . Cancer Maternal Grandfather     prostate  . Colon polyps Maternal Grandmother   . Diabetes Mother   . Endometriosis Mother   . Cancer Mother     lung  . Diabetes Father   . Heart disease Father   . Cirrhosis Paternal Grandfather    Social History  Substance Use Topics  . Smoking status: Current Every Day Smoker -- 0.33 packs/day for 12 years    Types: Cigarettes  . Smokeless tobacco: Never Used     Comment: trying to quit  . Alcohol Use: No     Comment: wine cooler for occasion   OB History    No data available     Review of Systems A 10 point review of systems was completed and was negative except for pertinent positives and negatives as mentioned in the history of  present illness     Allergies  Banana; Orange oil; and Latex  Home Medications   Prior to Admission medications   Medication Sig Start Date End Date Taking? Authorizing Provider  ALPRAZolam Duanne Moron) 0.5 MG tablet Take 1 mg by mouth 2 (two) times daily.    Yes Historical Provider, MD  ARIPiprazole (ABILIFY) 5 MG tablet Take 5 mg by mouth daily.   Yes Historical Provider, MD  busPIRone (BUSPAR) 15 MG tablet Take 15 mg by mouth 2 (two) times daily.   Yes Historical Provider, MD   cyclobenzaprine (FLEXERIL) 10 MG tablet Take 1 tablet (10 mg total) by mouth 2 (two) times daily as needed for muscle spasms. 06/13/14  Yes Montine Circle, PA-C  diazepam (VALIUM) 5 MG tablet Take 1 tablet (5 mg total) by mouth every 6 (six) hours as needed for muscle spasms. 12/01/14  Yes Kristeen Miss, MD  esomeprazole (NEXIUM) 40 MG capsule Take 40 mg by mouth daily.    Yes Historical Provider, MD  fluconazole (DIFLUCAN) 150 MG tablet Take 150 mg by mouth daily.   Yes Historical Provider, MD  FLUoxetine (PROZAC) 20 MG capsule Take 80 mg by mouth daily.    Yes Historical Provider, MD  oxyCODONE-acetaminophen (PERCOCET/ROXICET) 5-325 MG per tablet Take 1-2 tablets by mouth every 4 (four) hours as needed for moderate pain. 12/01/14  Yes Kristeen Miss, MD  prazosin (MINIPRESS) 2 MG capsule Take 2 mg by mouth daily.   Yes Historical Provider, MD  pregabalin (LYRICA) 150 MG capsule Take 150 mg by mouth 2 (two) times daily.    Yes Historical Provider, MD  traZODone (DESYREL) 50 MG tablet Take 50 mg by mouth at bedtime as needed for sleep.    Yes Historical Provider, MD  trolamine salicylate (ASPERCREME) 10 % cream Apply 1 application topically 2 (two) times daily as needed for muscle pain.   Yes Historical Provider, MD  ketorolac (TORADOL) 10 MG tablet Take 1 tablet (10 mg total) by mouth every 6 (six) hours as needed. 12/05/14   Comer Locket, PA-C   BP 116/83 mmHg  Pulse 77  Temp(Src) 98 F (36.7 C)  Resp 20  SpO2 96%  LMP 10/03/2014 Physical Exam  Constitutional: She is oriented to person, place, and time. She appears well-developed and well-nourished.  HENT:  Head: Normocephalic and atraumatic.  Mouth/Throat: Oropharynx is clear and moist.  Eyes: Conjunctivae are normal. Pupils are equal, round, and reactive to light. Right eye exhibits no discharge. Left eye exhibits no discharge. No scleral icterus.  Neck: Neck supple.  Cardiovascular: Normal rate, regular rhythm and normal heart sounds.    Pulmonary/Chest: Effort normal and breath sounds normal. No respiratory distress. She has no wheezes. She has no rales.  Abdominal: Soft. There is no tenderness.  Musculoskeletal: She exhibits no tenderness.  Tenderness diffusely throughout the left lumbar paraspinal musculature and over her SI joint. Patient maintains full active range of motion of bilateral lower extremities, however slightly limited secondary to pain.  Neurological: She is alert and oriented to person, place, and time.  Cranial Nerves II-XII grossly intact. Motor and sensation 5/5 in all 4 extremities. No focal neurological deficits.  Skin: Skin is warm and dry. No rash noted.  Midline surgical incision over L-spine appears to be healing well without any erythema, edema or drainage.  Psychiatric: She has a normal mood and affect.  Nursing note and vitals reviewed.   ED Course  Procedures (including critical care time) Labs Review Labs Reviewed  URINALYSIS, ROUTINE  W REFLEX MICROSCOPIC (NOT AT [facility])    Imaging Review No results found. I have personally reviewed and evaluated these images and lab results as part of my medical decision-making.   EKG Interpretation None     Meds given in ED:  Medications  ketorolac (TORADOL) injection 60 mg (60 mg Intramuscular Given 12/05/14 1614)    Discharge Medication List as of 12/05/2014  6:39 PM    START taking these medications   Details  ketorolac (TORADOL) 10 MG tablet Take 1 tablet (10 mg total) by mouth every 6 (six) hours as needed., Starting 12/05/2014, Until Discontinued, Print       Filed Vitals:   12/05/14 1442 12/05/14 1852  BP: 131/87 116/83  Pulse: 81 77  Temp: 98 F (36.7 C)   Resp: 20 20  SpO2: 97% 96%    MDM  Vitals stable - WNL -afebrile Pt resting comfortably in ED. Upon reevaluation at 13:50, patient is sleeping comfortably in exam bed in no apparent distress.  Patient reports low back pain after microdiscectomy surgery 1 week ago. Vital  signs remained stable, surgical incision appears to be healing well with no evidence of infection. Nonfocal neuro exam. Urinalysis without any evidence of UTI. Patient has been sleeping comfortably in ED bed in no apparent distress. Low suspicion for any acute or emergent cause of pain. We'll have patient follow-up with surgeon for reevaluation. Given an outpatient prescription for Toradol.  I discussed all relevant lab findings and imaging results with pt and they verbalized understanding. Discussed f/u with PCP within 48 hrs and return precautions, pt very amenable to plan.  Final diagnoses:  Low back pain, unspecified back pain laterality, with sciatica presence unspecified       Comer Locket, PA-C 12/05/14 [place], MD 12/05/14 [phone]

## 2015-01-21 ENCOUNTER — Encounter (HOSPITAL_COMMUNITY): Payer: Self-pay | Admitting: Cardiology

## 2015-01-21 ENCOUNTER — Emergency Department (HOSPITAL_COMMUNITY)
Admission: EM | Admit: 2015-01-21 | Discharge: 2015-01-21 | Disposition: A | Discharge status: Home / Self Care | Payer: Medicaid Other | Attending: Emergency Medicine | Admitting: Emergency Medicine

## 2015-01-21 DIAGNOSIS — E669 Obesity, unspecified: Secondary | ICD-10-CM | POA: Insufficient documentation

## 2015-01-21 DIAGNOSIS — G43909 Migraine, unspecified, not intractable, without status migrainosus: Secondary | ICD-10-CM | POA: Diagnosis not present

## 2015-01-21 DIAGNOSIS — Z79899 Other long term (current) drug therapy: Secondary | ICD-10-CM | POA: Insufficient documentation

## 2015-01-21 DIAGNOSIS — Z3202 Encounter for pregnancy test, result negative: Secondary | ICD-10-CM | POA: Insufficient documentation

## 2015-01-21 DIAGNOSIS — Z720 Tobacco use: Secondary | ICD-10-CM | POA: Insufficient documentation

## 2015-01-21 DIAGNOSIS — F4310 Post-traumatic stress disorder, unspecified: Secondary | ICD-10-CM | POA: Diagnosis not present

## 2015-01-21 DIAGNOSIS — Z8541 Personal history of malignant neoplasm of cervix uteri: Secondary | ICD-10-CM | POA: Diagnosis not present

## 2015-01-21 DIAGNOSIS — G8929 Other chronic pain: Secondary | ICD-10-CM | POA: Diagnosis not present

## 2015-01-21 DIAGNOSIS — Z8619 Personal history of other infectious and parasitic diseases: Secondary | ICD-10-CM | POA: Diagnosis not present

## 2015-01-21 DIAGNOSIS — Z91040 Latex allergy status: Secondary | ICD-10-CM | POA: Insufficient documentation

## 2015-01-21 DIAGNOSIS — K219 Gastro-esophageal reflux disease without esophagitis: Secondary | ICD-10-CM | POA: Insufficient documentation

## 2015-01-21 DIAGNOSIS — J45909 Unspecified asthma, uncomplicated: Secondary | ICD-10-CM | POA: Diagnosis not present

## 2015-01-21 DIAGNOSIS — F410 Panic disorder [episodic paroxysmal anxiety] without agoraphobia: Secondary | ICD-10-CM | POA: Diagnosis not present

## 2015-01-21 DIAGNOSIS — Z862 Personal history of diseases of the blood and blood-forming organs and certain disorders involving the immune mechanism: Secondary | ICD-10-CM | POA: Diagnosis not present

## 2015-01-21 DIAGNOSIS — M419 Scoliosis, unspecified: Secondary | ICD-10-CM | POA: Insufficient documentation

## 2015-01-21 DIAGNOSIS — N390 Urinary tract infection, site not specified: Secondary | ICD-10-CM | POA: Diagnosis not present

## 2015-01-21 DIAGNOSIS — R300 Dysuria: Secondary | ICD-10-CM | POA: Diagnosis present

## 2015-01-21 LAB — I-STAT CHEM 8, ED
BUN: 11 mg/dL (ref 6–20)
CALCIUM ION: 1.18 mmol/L (ref 1.12–1.23)
Chloride: 105 mmol/L (ref 101–111)
Creatinine, Ser: 0.8 mg/dL (ref 0.44–1.00)
Glucose, Bld: 90 mg/dL (ref 65–99)
HEMATOCRIT: 37 % (ref 36.0–46.0)
Hemoglobin: 12.6 g/dL (ref 12.0–15.0)
Potassium: 3.8 mmol/L (ref 3.5–5.1)
SODIUM: 141 mmol/L (ref 135–145)
TCO2: 21 mmol/L (ref 0–100)

## 2015-01-21 LAB — I-STAT BETA HCG BLOOD, ED (MC, WL, AP ONLY)

## 2015-01-21 LAB — URINALYSIS, ROUTINE W REFLEX MICROSCOPIC
BILIRUBIN URINE: NEGATIVE
Glucose, UA: NEGATIVE mg/dL
HGB URINE DIPSTICK: NEGATIVE
Ketones, ur: NEGATIVE mg/dL
NITRITE: NEGATIVE
PROTEIN: NEGATIVE mg/dL
SPECIFIC GRAVITY, URINE: 1.022 (ref 1.005–1.030)
UROBILINOGEN UA: 1 mg/dL (ref 0.0–1.0)
pH: 6.5 (ref 5.0–8.0)

## 2015-01-21 LAB — URINE MICROSCOPIC-ADD ON

## 2015-01-21 MED ORDER — CEPHALEXIN 250 MG PO CAPS
500.0000 mg | ORAL_CAPSULE | Freq: Once | ORAL | Status: AC
  Administered 2015-01-21: 500 mg via ORAL
  Filled 2015-01-21: qty 2

## 2015-01-21 MED ORDER — GI COCKTAIL ~~LOC~~
30.0000 mL | Freq: Once | ORAL | Status: AC
  Administered 2015-01-21: 30 mL via ORAL
  Filled 2015-01-21: qty 30

## 2015-01-21 MED ORDER — CEPHALEXIN 500 MG PO CAPS: 500.0000 mg | ORAL_CAPSULE | Freq: Two times a day (BID) | ORAL | Status: DC

## 2015-01-21 NOTE — Discharge Instructions (Signed)

## 2015-01-21 NOTE — ED Provider Notes (Signed)
CSN: [id number]     Arrival date & time 01/21/15  4010 History   First MD Initiated Contact with Patient 01/21/15 [phone]     Chief Complaint  Patient presents with  . Dysuria  . Gastroesophageal Reflux  . Back Pain     (Consider location/radiation/quality/duration/timing/severity/associated sxs/prior Treatment) HPI Comments: Patient presents to the emergency department with chief complaint of dysuria. She states that she began having dysuria for the past 2 days. She also reports a foul odor to her urine. She denies any associated fevers or chills. She states that she has had UTIs in the past, and this feels similar. She reports having mild low back pain. She has a history of chronic low back pain secondary to herniated disks, but is uncertain whether this is associated with her dysuria. Additionally, she complains of having some acid reflux after eating a piece of cake yesterday. She denies any abdominal pain. Denies nausea, vomiting, or diarrhea. There are no aggravating or alleviating factors.  The history is provided by the patient. No language interpreter was used.    Past Medical History  Diagnosis Date  . Scoliosis   . Obesity   . Migraine   . Gallstones   . Asthma   . Arthritis   . Pelvic inflammatory disease (PID) 05-08-11    previous hx. .Hx. childbirth x3-NVD  . Leukocytosis     going to hematologist  . History of renal failure   . Edema   . History of anemia   . Cancer ([place])   . Cervical cancer ([place])   . Anxiety     Panic attack  . DDD (degenerative disc disease), cervical   . PTSD (post-traumatic stress disorder)   . GERD (gastroesophageal reflux disease)   . Constipation   . PID (pelvic inflammatory disease)   . HPV (human papilloma virus) anogenital infection    Past Surgical History  Procedure Laterality Date  . Abdominal exploration surgery  approx 29 years old    rlq(due to adhesions around ovaries)-appendix and ovaries remain  . Cholecystectomy   05/10/2011    Procedure: LAPAROSCOPIC CHOLECYSTECTOMY WITH INTRAOPERATIVE CHOLANGIOGRAM;  Surgeon: Gayland Curry, MD,FACS;  Location: [facility];  Service: General;  Laterality: N/A;  . Anterior cervical decomp/discectomy fusion N/A 12/09/2012    Procedure: ANTERIOR CERVICAL DECOMPRESSION/DISCECTOMY FUSION STRUCTURAL ALLOGRAFT TRESTLE PLATE CERVICAL FIVE-SIX,SIX-SEVEN;  Surgeon: Otilio Connors, MD;  Location: [place] [facility];  Service: Neurosurgery;  Laterality: N/A;  . Lumbar laminectomy/decompression microdiscectomy Left 11/30/2014    Procedure: Left lumbar four-five Microdiskectomy;  Surgeon: Kristeen Miss, MD;  Location: [place] [facility];  Service: Neurosurgery;  Laterality: Left;   Family History  Problem Relation Age of Onset  . Prostate cancer Maternal Grandfather   . Cancer Maternal Grandfather     prostate  . Colon polyps Maternal Grandmother   . Diabetes Mother   . Endometriosis Mother   . Cancer Mother     lung  . Diabetes Father   . Heart disease Father   . Cirrhosis Paternal Grandfather    Social History  Substance Use Topics  . Smoking status: Current Every Day Smoker -- 0.33 packs/day for 12 years    Types: Cigarettes  . Smokeless tobacco: Never Used     Comment: trying to quit  . Alcohol Use: No     Comment: wine cooler for occasion   OB History    No data available     Review of Systems  Constitutional: Negative for fever and chills.  Respiratory: Negative for shortness of breath.   Cardiovascular: Negative for chest pain.  Gastrointestinal: Negative for nausea, vomiting, diarrhea and constipation.  Genitourinary: Positive for dysuria.  All other systems reviewed and are negative.     Allergies  Banana; Orange oil; Latex; and Miconazole  Home Medications   Prior to Admission medications   Medication Sig Start Date End Date Taking? Authorizing Provider  ALPRAZolam Duanne Moron) 0.5 MG tablet Take 1 mg by mouth 2 (two) times daily.    Yes Historical Provider, MD   ARIPiprazole (ABILIFY) 5 MG tablet Take 5 mg by mouth daily.   Yes Historical Provider, MD  busPIRone (BUSPAR) 15 MG tablet Take 15 mg by mouth 2 (two) times daily.   Yes Historical Provider, MD  cyclobenzaprine (FLEXERIL) 10 MG tablet Take 1 tablet (10 mg total) by mouth 2 (two) times daily as needed for muscle spasms. 06/13/14  Yes Montine Circle, PA-C  diazepam (VALIUM) 5 MG tablet Take 1 tablet (5 mg total) by mouth every 6 (six) hours as needed for muscle spasms. 12/01/14  Yes Kristeen Miss, MD  esomeprazole (NEXIUM) 40 MG capsule Take 40 mg by mouth daily.    Yes Historical Provider, MD  fluconazole (DIFLUCAN) 150 MG tablet Take 150 mg by mouth daily.   Yes Historical Provider, MD  FLUoxetine (PROZAC) 20 MG capsule Take 80 mg by mouth daily.    Yes Historical Provider, MD  prazosin (MINIPRESS) 2 MG capsule Take 2 mg by mouth at bedtime.    Yes Historical Provider, MD  traZODone (DESYREL) 50 MG tablet Take 100 mg by mouth at bedtime.    Yes Historical Provider, MD  zolpidem (AMBIEN) 10 MG tablet Take 10 mg by mouth at bedtime as needed for sleep.   Yes Historical Provider, MD  ketorolac (TORADOL) 10 MG tablet Take 1 tablet (10 mg total) by mouth every 6 (six) hours as needed. Patient not taking: Reported on 01/21/2015 12/05/14   Comer Locket, PA-C  oxyCODONE-acetaminophen (PERCOCET/ROXICET) 5-325 MG per tablet Take 1-2 tablets by mouth every 4 (four) hours as needed for moderate pain. Patient not taking: Reported on 01/21/2015 12/01/14   Kristeen Miss, MD   BP 108/64 mmHg  Pulse 74  Temp(Src) 97.6 F (36.4 C) (Oral)  Resp 18  Ht 5\' 3"  (1.6 m)  Wt 258 lb (117.028 kg)  BMI 45.71 kg/m2  SpO2 97%  LMP 11/07/2014 Physical Exam  Constitutional: She is oriented to person, place, and time. She appears well-developed and well-nourished.  HENT:  Head: Normocephalic and atraumatic.  Eyes: Conjunctivae and EOM are normal. Pupils are equal, round, and reactive to light.  Neck: Normal range of  motion. Neck supple.  Cardiovascular: Normal rate and regular rhythm.  Exam reveals no gallop and no friction rub.   No murmur heard. Pulmonary/Chest: Effort normal and breath sounds normal. No respiratory distress. She has no wheezes. She has no rales. She exhibits no tenderness.  Abdominal: Soft. Bowel sounds are normal. She exhibits no distension and no mass. There is no tenderness. There is no rebound and no guarding.  No focal abdominal tenderness, no RLQ tenderness or pain at McBurney's point, no RUQ tenderness or Murphy's sign, no left-sided abdominal tenderness, no fluid wave, or signs of peritonitis   Musculoskeletal: Normal range of motion. She exhibits no edema or tenderness.  Neurological: She is alert and oriented to person, place, and time.  Skin: Skin is warm and dry.  Psychiatric: She has a normal mood and affect. Her behavior  is normal. Judgment and thought content normal.  Nursing note and vitals reviewed.   ED Course  Procedures (including critical care time) Results for orders placed or performed during the hospital encounter of 01/21/15  Urinalysis, Routine w reflex microscopic (not at [facility])  Result Value Ref Range   Color, Urine YELLOW YELLOW   APPearance CLOUDY (A) CLEAR   Specific Gravity, Urine 1.022 1.005 - 1.030   pH 6.5 5.0 - 8.0   Glucose, UA NEGATIVE NEGATIVE mg/dL   Hgb urine dipstick NEGATIVE NEGATIVE   Bilirubin Urine NEGATIVE NEGATIVE   Ketones, ur NEGATIVE NEGATIVE mg/dL   Protein, ur NEGATIVE NEGATIVE mg/dL   Urobilinogen, UA 1.0 0.0 - 1.0 mg/dL   Nitrite NEGATIVE NEGATIVE   Leukocytes, UA MODERATE (A) NEGATIVE  Urine microscopic-add on  Result Value Ref Range   Squamous Epithelial / LPF FEW (A) RARE   WBC, UA TOO NUMEROUS TO COUNT <3 WBC/hpf   RBC / HPF 3-6 <3 RBC/hpf   Bacteria, UA FEW (A) RARE  I-stat chem 8, ed  Result Value Ref Range   Sodium 141 135 - 145 mmol/L   Potassium 3.8 3.5 - 5.1 mmol/L   Chloride 105 101 - 111 mmol/L   BUN  11 6 - 20 mg/dL   Creatinine, Ser 0.80 0.44 - 1.00 mg/dL   Glucose, Bld 90 65 - 99 mg/dL   Calcium, Ion 1.18 1.12 - 1.23 mmol/L   TCO2 21 0 - 100 mmol/L   Hemoglobin 12.6 12.0 - 15.0 g/dL   HCT 37.0 36.0 - 46.0 %  I-Stat Beta hCG blood, ED ([facility], [facility], [facility] only)  Result Value Ref Range   I-stat hCG, quantitative <5.0 <5 mIU/mL   Comment 3           No results found.    MDM   Final diagnoses:  UTI (lower urinary tract infection)    Patient with symptoms consistent with UTI. Will treat with Keflex. Patient states last period was a couple months ago. Pregnancy test negative today. Recommend primary care follow-up. Patient feels significantly improved after GI cocktail in relation to acid reflux. Recommend Tums/Pepto-Bismol. Recommend primary care follow-up. Patient understands and agrees with plan. She is stable and ready for discharge.    Montine Circle, PA-C 01/21/15 [place], MD 01/21/15 1017

## 2015-01-21 NOTE — ED Notes (Signed)
Reports dysuria, GERD and lower back pain for the past couple of days. States this feels like a UTI

## 2015-01-21 NOTE — ED Notes (Signed)
Pt remains monitored by blood pressure and pulse ox.  

## 2015-02-07 ENCOUNTER — Emergency Department (HOSPITAL_COMMUNITY)
Admission: EM | Admit: 2015-02-07 | Discharge: 2015-02-07 | Disposition: A | Discharge status: Home / Self Care | Payer: Medicaid Other

## 2015-02-07 NOTE — ED Notes (Signed)
Patient reports "I'm going to come back later".

## 2015-04-27 ENCOUNTER — Encounter (HOSPITAL_COMMUNITY): Payer: Self-pay | Admitting: Emergency Medicine

## 2015-04-27 ENCOUNTER — Emergency Department (HOSPITAL_COMMUNITY)
Admission: EM | Admit: 2015-04-27 | Discharge: 2015-04-27 | Disposition: A | Discharge status: Home / Self Care | Payer: Medicaid Other | Attending: Emergency Medicine | Admitting: Emergency Medicine

## 2015-04-27 DIAGNOSIS — Z8619 Personal history of other infectious and parasitic diseases: Secondary | ICD-10-CM | POA: Diagnosis not present

## 2015-04-27 DIAGNOSIS — Z8541 Personal history of malignant neoplasm of cervix uteri: Secondary | ICD-10-CM | POA: Diagnosis not present

## 2015-04-27 DIAGNOSIS — Z91040 Latex allergy status: Secondary | ICD-10-CM | POA: Insufficient documentation

## 2015-04-27 DIAGNOSIS — F410 Panic disorder [episodic paroxysmal anxiety] without agoraphobia: Secondary | ICD-10-CM | POA: Diagnosis not present

## 2015-04-27 DIAGNOSIS — Z791 Long term (current) use of non-steroidal anti-inflammatories (NSAID): Secondary | ICD-10-CM | POA: Insufficient documentation

## 2015-04-27 DIAGNOSIS — N760 Acute vaginitis: Secondary | ICD-10-CM | POA: Insufficient documentation

## 2015-04-27 DIAGNOSIS — E669 Obesity, unspecified: Secondary | ICD-10-CM | POA: Insufficient documentation

## 2015-04-27 DIAGNOSIS — B9689 Other specified bacterial agents as the cause of diseases classified elsewhere: Secondary | ICD-10-CM

## 2015-04-27 DIAGNOSIS — M1990 Unspecified osteoarthritis, unspecified site: Secondary | ICD-10-CM | POA: Insufficient documentation

## 2015-04-27 DIAGNOSIS — F17210 Nicotine dependence, cigarettes, uncomplicated: Secondary | ICD-10-CM | POA: Insufficient documentation

## 2015-04-27 DIAGNOSIS — G43909 Migraine, unspecified, not intractable, without status migrainosus: Secondary | ICD-10-CM | POA: Insufficient documentation

## 2015-04-27 DIAGNOSIS — Z862 Personal history of diseases of the blood and blood-forming organs and certain disorders involving the immune mechanism: Secondary | ICD-10-CM | POA: Diagnosis not present

## 2015-04-27 DIAGNOSIS — F4310 Post-traumatic stress disorder, unspecified: Secondary | ICD-10-CM | POA: Diagnosis not present

## 2015-04-27 DIAGNOSIS — N898 Other specified noninflammatory disorders of vagina: Secondary | ICD-10-CM | POA: Diagnosis present

## 2015-04-27 DIAGNOSIS — K219 Gastro-esophageal reflux disease without esophagitis: Secondary | ICD-10-CM | POA: Insufficient documentation

## 2015-04-27 DIAGNOSIS — M419 Scoliosis, unspecified: Secondary | ICD-10-CM | POA: Insufficient documentation

## 2015-04-27 DIAGNOSIS — J45909 Unspecified asthma, uncomplicated: Secondary | ICD-10-CM | POA: Diagnosis not present

## 2015-04-27 DIAGNOSIS — Z8742 Personal history of other diseases of the female genital tract: Secondary | ICD-10-CM | POA: Insufficient documentation

## 2015-04-27 DIAGNOSIS — Z79899 Other long term (current) drug therapy: Secondary | ICD-10-CM | POA: Diagnosis not present

## 2015-04-27 LAB — URINALYSIS, ROUTINE W REFLEX MICROSCOPIC
Bilirubin Urine: NEGATIVE
Glucose, UA: NEGATIVE mg/dL
Hgb urine dipstick: NEGATIVE
Ketones, ur: NEGATIVE mg/dL
NITRITE: NEGATIVE
PH: 5.5 (ref 5.0–8.0)
Protein, ur: NEGATIVE mg/dL
SPECIFIC GRAVITY, URINE: 1.028 (ref 1.005–1.030)

## 2015-04-27 LAB — WET PREP, GENITAL
Sperm: NONE SEEN
TRICH WET PREP: NONE SEEN
YEAST WET PREP: NONE SEEN

## 2015-04-27 LAB — URINE MICROSCOPIC-ADD ON

## 2015-04-27 MED ORDER — METRONIDAZOLE 500 MG PO TABS: 500.0000 mg | ORAL_TABLET | Freq: Two times a day (BID) | ORAL | Status: DC

## 2015-04-27 MED ORDER — FLUCONAZOLE 200 MG PO TABS
200.0000 mg | ORAL_TABLET | Freq: Once | ORAL | Status: AC
Start: 2015-04-27 — End: 2015-04-27
  Administered 2015-04-27: 200 mg via ORAL
  Filled 2015-04-27: qty 1

## 2015-04-27 NOTE — ED Provider Notes (Signed)
CSN: [id number]     Arrival date & time 04/27/15  1153 History   First MD Initiated Contact with Patient 04/27/15 1500     Chief Complaint  Patient presents with  . Vaginal Itching     (Consider location/radiation/quality/duration/timing/severity/associated sxs/prior Treatment) Patient is a 30 y.o. female presenting with vaginal itching. The history is provided by the patient.  Vaginal Itching This is a recurrent problem. The current episode started more than 1 week ago. The problem occurs constantly. The problem has not changed since onset.Pertinent negatives include no chest pain, no headaches and no shortness of breath. Nothing aggravates the symptoms. Nothing relieves the symptoms. She has tried nothing for the symptoms. The treatment provided no (tried two rounds of diflucan without relief) relief.    28 yoF with a chief complaint of vaginal itching. This been going on for about 2 weeks. Patient has seen her primary provider 3 times for this. Again 2 separate doses of Diflucan without relief. Patient thinks is probably related to her hyperglycemia which is poorly controlled. She says she is changed her diet and has not had any relief. Now is having some vaginal discharge as well. States it's a burning itching denies radiation. Eyes pelvic cramping denies vaginal bleeding.  Past Medical History  Diagnosis Date  . Scoliosis   . Obesity   . Migraine   . Gallstones   . Asthma   . Arthritis   . Pelvic inflammatory disease (PID) 05-08-11    previous hx. .Hx. childbirth x3-NVD  . Leukocytosis     going to hematologist  . History of renal failure   . Edema   . History of anemia   . Cancer ([place])   . Cervical cancer ([place])   . Anxiety     Panic attack  . DDD (degenerative disc disease), cervical   . PTSD (post-traumatic stress disorder)   . GERD (gastroesophageal reflux disease)   . Constipation   . PID (pelvic inflammatory disease)   . HPV (human papilloma virus) anogenital  infection    Past Surgical History  Procedure Laterality Date  . Abdominal exploration surgery  approx 30 years old    rlq(due to adhesions around ovaries)-appendix and ovaries remain  . Cholecystectomy  05/10/2011    Procedure: LAPAROSCOPIC CHOLECYSTECTOMY WITH INTRAOPERATIVE CHOLANGIOGRAM;  Surgeon: Gayland Curry, MD,FACS;  Location: [facility];  Service: General;  Laterality: N/A;  . Anterior cervical decomp/discectomy fusion N/A 12/09/2012    Procedure: ANTERIOR CERVICAL DECOMPRESSION/DISCECTOMY FUSION STRUCTURAL ALLOGRAFT TRESTLE PLATE CERVICAL FIVE-SIX,SIX-SEVEN;  Surgeon: Otilio Connors, MD;  Location: [place] [facility];  Service: Neurosurgery;  Laterality: N/A;  . Lumbar laminectomy/decompression microdiscectomy Left 11/30/2014    Procedure: Left lumbar four-five Microdiskectomy;  Surgeon: Kristeen Miss, MD;  Location: [place] [facility];  Service: Neurosurgery;  Laterality: Left;   Family History  Problem Relation Age of Onset  . Prostate cancer Maternal Grandfather   . Cancer Maternal Grandfather     prostate  . Colon polyps Maternal Grandmother   . Diabetes Mother   . Endometriosis Mother   . Cancer Mother     lung  . Diabetes Father   . Heart disease Father   . Cirrhosis Paternal Grandfather    Social History  Substance Use Topics  . Smoking status: Current Every Day Smoker -- 0.33 packs/day for 12 years    Types: Cigarettes  . Smokeless tobacco: Never Used     Comment: trying to quit  . Alcohol Use: No  Comment: wine cooler for occasion   OB History    No data available     Review of Systems  Constitutional: Negative for fever and chills.  HENT: Negative for congestion and rhinorrhea.   Eyes: Negative for redness and visual disturbance.  Respiratory: Negative for shortness of breath and wheezing.   Cardiovascular: Negative for chest pain and palpitations.  Gastrointestinal: Negative for nausea and vomiting.  Genitourinary: Negative for dysuria, urgency, vaginal pain and  pelvic pain.       Vaginal itching   Musculoskeletal: Negative for myalgias and arthralgias.  Skin: Negative for pallor and wound.  Neurological: Negative for dizziness and headaches.      Allergies  Banana; Orange oil; Latex; and Miconazole  Home Medications   Prior to Admission medications   Medication Sig Start Date End Date Taking? Authorizing Provider  ALPRAZolam Duanne Moron) 0.5 MG tablet Take 0.5 mg by mouth 2 (two) times daily.    Yes Historical Provider, MD  ARIPiprazole (ABILIFY) 5 MG tablet Take 5 mg by mouth daily.   Yes Historical Provider, MD  busPIRone (BUSPAR) 15 MG tablet Take 15 mg by mouth 2 (two) times daily.   Yes Historical Provider, MD  diclofenac (VOLTAREN) 75 MG EC tablet Take 75 mg by mouth 2 (two) times daily.   Yes Historical Provider, MD  esomeprazole (NEXIUM) 40 MG capsule Take 40 mg by mouth daily.    Yes Historical Provider, MD  FLUoxetine (PROZAC) 20 MG capsule Take 80 mg by mouth daily.    Yes Historical Provider, MD  ibuprofen (ADVIL,MOTRIN) 200 MG tablet Take 400 mg by mouth every 6 (six) hours as needed for moderate pain.   Yes Historical Provider, MD  oxyCODONE-acetaminophen (PERCOCET/ROXICET) 5-325 MG per tablet Take 1-2 tablets by mouth every 4 (four) hours as needed for moderate pain. 12/01/14  Yes Kristeen Miss, MD  prazosin (MINIPRESS) 2 MG capsule Take 2 mg by mouth at bedtime.    Yes Historical Provider, MD  traZODone (DESYREL) 50 MG tablet Take 100 mg by mouth at bedtime.    Yes Historical Provider, MD  zolpidem (AMBIEN) 10 MG tablet Take 10 mg by mouth at bedtime as needed for sleep.   Yes Historical Provider, MD  cephALEXin (KEFLEX) 500 MG capsule Take 1 capsule (500 mg total) by mouth 2 (two) times daily. Patient not taking: Reported on 04/27/2015 01/21/15   Montine Circle, PA-C  cyclobenzaprine (FLEXERIL) 10 MG tablet Take 1 tablet (10 mg total) by mouth 2 (two) times daily as needed for muscle spasms. Patient not taking: Reported on 04/27/2015  06/13/14   Montine Circle, PA-C  diazepam (VALIUM) 5 MG tablet Take 1 tablet (5 mg total) by mouth every 6 (six) hours as needed for muscle spasms. Patient not taking: Reported on 04/27/2015 12/01/14   Kristeen Miss, MD  ketorolac (TORADOL) 10 MG tablet Take 1 tablet (10 mg total) by mouth every 6 (six) hours as needed. Patient not taking: Reported on 01/21/2015 12/05/14   Comer Locket, PA-C  metroNIDAZOLE (FLAGYL) 500 MG tablet Take 1 tablet (500 mg total) by mouth 2 (two) times daily. One po bid x 7 days 04/27/15   Deno Etienne, DO   BP 108/87 mmHg  Pulse 78  Temp(Src) 97.9 F (36.6 C) (Oral)  Resp 18  SpO2 96%  LMP 12/09/2014 Physical Exam  Constitutional: She is oriented to person, place, and time. She appears well-developed and well-nourished. No distress.  HENT:  Head: Normocephalic and atraumatic.  Eyes: EOM are normal.  Pupils are equal, round, and reactive to light.  Neck: Normal range of motion. Neck supple.  Cardiovascular: Normal rate and regular rhythm.  Exam reveals no gallop and no friction rub.   No murmur heard. Pulmonary/Chest: Effort normal. She has no wheezes. She has no rales.  Abdominal: Soft. She exhibits no distension. There is no tenderness. There is no rebound and no guarding.  Genitourinary: There is rash (whitish discharge) on the right labia. There is rash on the left labia. Cervix exhibits discharge (mucousy). Cervix exhibits no motion tenderness. Vaginal discharge (whitish, thick) found.  Musculoskeletal: She exhibits no edema or tenderness.  Neurological: She is alert and oriented to person, place, and time.  Skin: Skin is warm and dry. She is not diaphoretic.  Psychiatric: She has a normal mood and affect. Her behavior is normal.  Nursing note and vitals reviewed.   ED Course  Procedures (including critical care time) Labs Review Labs Reviewed  WET PREP, GENITAL - Abnormal; Notable for the following:    Clue Cells Wet Prep HPF POC PRESENT (*)    WBC, Wet  Prep HPF POC FEW (*)    All other components within normal limits  URINALYSIS, ROUTINE W REFLEX MICROSCOPIC (NOT AT [facility]) - Abnormal; Notable for the following:    APPearance CLOUDY (*)    Leukocytes, UA TRACE (*)    All other components within normal limits  URINE MICROSCOPIC-ADD ON - Abnormal; Notable for the following:    Squamous Epithelial / LPF 6-30 (*)    Bacteria, UA FEW (*)    All other components within normal limits  POC URINE PREG, ED  GC/CHLAMYDIA PROBE AMP ([place]) NOT AT [facility]    Imaging Review No results found. I have personally reviewed and evaluated these images and lab results as part of my medical decision-making.   EKG Interpretation None      MDM   Final diagnoses:  BV (bacterial vaginosis)    30 yo F with a chief complaint of vaginal itching. Suspect BV based on her pelvic exam. Patient has a follow-up appointment with her gynecologist tomorrow. We'll have her follow-up with them.   found to have BV on wet prep. Will treat.  5:57 PM: I have discussed the diagnosis/risks/treatment options with the patient and believe the pt to be eligible for discharge home to follow-up with OB. We also discussed returning to the ED immediately if new or worsening sx occur. We discussed the sx which are most concerning (e.g., pelvic pain, fever, inability to tolerate by mouth ) that necessitate immediate return. Medications administered to the patient during their visit and any new prescriptions provided to the patient are listed below.  Medications given during this visit Medications  fluconazole (DIFLUCAN) tablet 200 mg (200 mg Oral Given 04/27/15 1724)    Discharge Medication List as of 04/27/2015  4:59 PM    START taking these medications   Details  metroNIDAZOLE (FLAGYL) 500 MG tablet Take 1 tablet (500 mg total) by mouth 2 (two) times daily. One po bid x 7 days, Starting 04/27/2015, Until Discontinued, Print        The patient appears reasonably screen and/or  stabilized for discharge and I doubt any other medical condition or other [facility] requiring further screening, evaluation, or treatment in the ED at this time prior to discharge.    Deno Etienne, DO 04/27/15 1757

## 2015-04-27 NOTE — Discharge Instructions (Signed)

## 2015-04-27 NOTE — ED Notes (Signed)
Pt reports vaginal itching x 2 weeks, was treated 2 weeks ago with no relief. Also vaginal discharge today, dysuria, denies hematuria. Bilateral flank pain, emesis x 2 days.

## 2015-04-28 LAB — GC/CHLAMYDIA PROBE AMP (~~LOC~~) NOT AT ARMC
CHLAMYDIA, DNA PROBE: NEGATIVE
NEISSERIA GONORRHEA: NEGATIVE

## 2015-06-03 ENCOUNTER — Emergency Department (HOSPITAL_COMMUNITY)
Admission: EM | Admit: 2015-06-03 | Discharge: 2015-06-03 | Disposition: A | Discharge status: Home / Self Care | Payer: Medicaid Other | Attending: Emergency Medicine | Admitting: Emergency Medicine

## 2015-06-03 ENCOUNTER — Encounter (HOSPITAL_COMMUNITY): Payer: Self-pay | Admitting: Emergency Medicine

## 2015-06-03 DIAGNOSIS — F1110 Opioid abuse, uncomplicated: Secondary | ICD-10-CM | POA: Diagnosis not present

## 2015-06-03 DIAGNOSIS — R112 Nausea with vomiting, unspecified: Secondary | ICD-10-CM | POA: Diagnosis present

## 2015-06-03 DIAGNOSIS — G43909 Migraine, unspecified, not intractable, without status migrainosus: Secondary | ICD-10-CM | POA: Insufficient documentation

## 2015-06-03 DIAGNOSIS — J45909 Unspecified asthma, uncomplicated: Secondary | ICD-10-CM | POA: Insufficient documentation

## 2015-06-03 DIAGNOSIS — Z862 Personal history of diseases of the blood and blood-forming organs and certain disorders involving the immune mechanism: Secondary | ICD-10-CM | POA: Diagnosis not present

## 2015-06-03 DIAGNOSIS — Z8719 Personal history of other diseases of the digestive system: Secondary | ICD-10-CM | POA: Insufficient documentation

## 2015-06-03 DIAGNOSIS — Z79899 Other long term (current) drug therapy: Secondary | ICD-10-CM | POA: Diagnosis not present

## 2015-06-03 DIAGNOSIS — Z91040 Latex allergy status: Secondary | ICD-10-CM | POA: Insufficient documentation

## 2015-06-03 DIAGNOSIS — M1990 Unspecified osteoarthritis, unspecified site: Secondary | ICD-10-CM | POA: Insufficient documentation

## 2015-06-03 DIAGNOSIS — F1210 Cannabis abuse, uncomplicated: Secondary | ICD-10-CM | POA: Insufficient documentation

## 2015-06-03 DIAGNOSIS — J302 Other seasonal allergic rhinitis: Secondary | ICD-10-CM

## 2015-06-03 DIAGNOSIS — K529 Noninfective gastroenteritis and colitis, unspecified: Secondary | ICD-10-CM | POA: Insufficient documentation

## 2015-06-03 DIAGNOSIS — F17210 Nicotine dependence, cigarettes, uncomplicated: Secondary | ICD-10-CM | POA: Diagnosis not present

## 2015-06-03 DIAGNOSIS — Z8541 Personal history of malignant neoplasm of cervix uteri: Secondary | ICD-10-CM | POA: Insufficient documentation

## 2015-06-03 DIAGNOSIS — F1410 Cocaine abuse, uncomplicated: Secondary | ICD-10-CM | POA: Insufficient documentation

## 2015-06-03 DIAGNOSIS — K219 Gastro-esophageal reflux disease without esophagitis: Secondary | ICD-10-CM | POA: Insufficient documentation

## 2015-06-03 DIAGNOSIS — E669 Obesity, unspecified: Secondary | ICD-10-CM | POA: Insufficient documentation

## 2015-06-03 DIAGNOSIS — F419 Anxiety disorder, unspecified: Secondary | ICD-10-CM | POA: Insufficient documentation

## 2015-06-03 DIAGNOSIS — Z791 Long term (current) use of non-steroidal anti-inflammatories (NSAID): Secondary | ICD-10-CM | POA: Insufficient documentation

## 2015-06-03 LAB — COMPREHENSIVE METABOLIC PANEL
ALBUMIN: 4 g/dL (ref 3.5–5.0)
ALK PHOS: 79 U/L (ref 38–126)
ALT: 27 U/L (ref 14–54)
ANION GAP: 14 (ref 5–15)
AST: 29 U/L (ref 15–41)
BUN: 7 mg/dL (ref 6–20)
CALCIUM: 9.3 mg/dL (ref 8.9–10.3)
CHLORIDE: 104 mmol/L (ref 101–111)
CO2: 21 mmol/L — AB (ref 22–32)
Creatinine, Ser: 0.98 mg/dL (ref 0.44–1.00)
GFR calc non Af Amer: >60 mL/min (ref >60)
GLUCOSE: 82 mg/dL (ref 65–99)
POTASSIUM: 3.5 mmol/L (ref 3.5–5.1)
SODIUM: 139 mmol/L (ref 135–145)
Total Bilirubin: 0.4 mg/dL (ref 0.3–1.2)
Total Protein: 7.5 g/dL (ref 6.5–8.1)

## 2015-06-03 LAB — CBC
HEMATOCRIT: 37.8 % (ref 36.0–46.0)
HEMOGLOBIN: 12.2 g/dL (ref 12.0–15.0)
MCH: 24.4 pg — AB (ref 26.0–34.0)
MCHC: 32.3 g/dL (ref 30.0–36.0)
MCV: 75.6 fL — AB (ref 78.0–100.0)
Platelets: 320 10*3/uL (ref 150–400)
RBC: 5 MIL/uL (ref 3.87–5.11)
RDW: 18.4 % — ABNORMAL HIGH (ref 11.5–15.5)
WBC: 11 10*3/uL — ABNORMAL HIGH (ref 4.0–10.5)

## 2015-06-03 LAB — RAPID URINE DRUG SCREEN, HOSP PERFORMED
Amphetamines: NOT DETECTED
Barbiturates: NOT DETECTED
Benzodiazepines: NOT DETECTED
COCAINE: POSITIVE — AB
OPIATES: POSITIVE — AB
TETRAHYDROCANNABINOL: POSITIVE — AB

## 2015-06-03 LAB — URINALYSIS, ROUTINE W REFLEX MICROSCOPIC
GLUCOSE, UA: NEGATIVE mg/dL
HGB URINE DIPSTICK: NEGATIVE
Ketones, ur: 15 mg/dL — AB
Nitrite: NEGATIVE
PH: 6 (ref 5.0–8.0)
Protein, ur: 30 mg/dL — AB
SPECIFIC GRAVITY, URINE: 1.027 (ref 1.005–1.030)

## 2015-06-03 LAB — URINE MICROSCOPIC-ADD ON
BACTERIA UA: NONE SEEN
RBC / HPF: NONE SEEN RBC/hpf (ref 0–5)

## 2015-06-03 LAB — LIPASE, BLOOD: Lipase: 25 U/L (ref 11–51)

## 2015-06-03 LAB — I-STAT BETA HCG BLOOD, ED (MC, WL, AP ONLY)

## 2015-06-03 MED ORDER — GI COCKTAIL ~~LOC~~
30.0000 mL | Freq: Once | ORAL | Status: AC
  Administered 2015-06-03: 30 mL via ORAL
  Filled 2015-06-03: qty 30

## 2015-06-03 MED ORDER — SODIUM CHLORIDE 0.9 % IV BOLUS (SEPSIS)
1000.0000 mL | Freq: Once | INTRAVENOUS | Status: AC
  Administered 2015-06-03: 1000 mL via INTRAVENOUS

## 2015-06-03 MED ORDER — ONDANSETRON HCL 4 MG/2ML IJ SOLN
4.0000 mg | Freq: Once | INTRAMUSCULAR | Status: AC
  Administered 2015-06-03: 4 mg via INTRAVENOUS
  Filled 2015-06-03: qty 2

## 2015-06-03 MED ORDER — ONDANSETRON HCL 4 MG PO TABS: 4.0000 mg | ORAL_TABLET | Freq: Three times a day (TID) | ORAL | Status: DC | PRN

## 2015-06-03 MED ORDER — LORATADINE 10 MG PO TABS
10.0000 mg | ORAL_TABLET | Freq: Every day | ORAL | Status: DC
  Administered 2015-06-03: 10 mg via ORAL
  Filled 2015-06-03: qty 1

## 2015-06-03 MED ORDER — CETIRIZINE HCL 5 MG/5ML PO SYRP: 2.5000 mg | ORAL_SOLUTION | Freq: Once | ORAL | Status: DC

## 2015-06-03 MED ORDER — CETIRIZINE HCL 5 MG PO TABS: 5.0000 mg | ORAL_TABLET | Freq: Every day | ORAL | Status: DC

## 2015-06-03 MED ORDER — DIPHENHYDRAMINE HCL 25 MG PO CAPS: 25.0000 mg | ORAL_CAPSULE | Freq: Once | ORAL | Status: DC

## 2015-06-03 NOTE — ED Notes (Signed)
Pt ambulating independently w/ steady gait on d/c in no acute distress, A&Ox4. D/c instructions reviewed w/ pt - pt denies any further questions or concerns at present.  

## 2015-06-03 NOTE — Discharge Instructions (Signed)
You were diagnosed with gastroenteritis You may take Zofran as needed for nausea You were also given Zyrtec for allergies Make sure you follow with your PCP with in teh next week If you have worsening nausea, vomiting and cannot keep food down, or if you develop fever shortness of breath or chest pain please return to the ED for evaluation

## 2015-06-03 NOTE — ED Provider Notes (Addendum)
CSN: [id number]     Arrival date & time 06/03/15  [id number] History   First MD Initiated Contact with Patient 06/03/15 [phone]     Chief Complaint  Patient presents with  . Abdominal Pain  . Emesis  . Sciatica     HPI   30 y/o F presenting for abdominal pain nausea, vomiting yesterday afternoon after eating [employer] chicken sandwich. Reports that shortly after eating the sandwich, she started to feel ill and vomited several times. Last emesis was this AM after trying to drink cool aid. Her last PO intake was yesterday at approximately 3 pm. She has not been abe to tolerate any since then.  She also reports loose stools for the last 2 weeks. She had one dark colored stool yesterday but had a brown one today.  Of note, she stopped taking percocet for chronic sciatica and back pain at that time. She previously took 1 percocet every 4-6 hours. She stopped out of concern for opioid addiction and tolerance. She denies bright red blood in the stool history of GI bleed, fevers associated or abdominal pain.    With runny nose, and postnasal drip. She reports the symptoms are consistent with her history of allergies. She is previously taken Zyrtec and Allegra. But now takes Flonase which she feels was not helpful. She denies fevers, cough, shortness of breath associated.  She denies known sick contacts  Otherwise she denies chest pain, shortness of breath.  Past Medical History  Diagnosis Date  . Scoliosis   . Obesity   . Migraine   . Gallstones   . Asthma   . Arthritis   . Pelvic inflammatory disease (PID) 05-08-11    previous hx. .Hx. childbirth x3-NVD  . Leukocytosis     going to hematologist  . History of renal failure   . Edema   . History of anemia   . Cancer ([place])   . Cervical cancer ([place])   . Anxiety     Panic attack  . DDD (degenerative disc disease), cervical   . PTSD (post-traumatic stress disorder)   . GERD (gastroesophageal reflux disease)   . Constipation   . PID (pelvic  inflammatory disease)   . HPV (human papilloma virus) anogenital infection    Past Surgical History  Procedure Laterality Date  . Abdominal exploration surgery  approx 30 years old    rlq(due to adhesions around ovaries)-appendix and ovaries remain  . Cholecystectomy  05/10/2011    Procedure: LAPAROSCOPIC CHOLECYSTECTOMY WITH INTRAOPERATIVE CHOLANGIOGRAM;  Surgeon: Gayland Curry, MD,FACS;  Location: [facility];  Service: General;  Laterality: N/A;  . Anterior cervical decomp/discectomy fusion N/A 12/09/2012    Procedure: ANTERIOR CERVICAL DECOMPRESSION/DISCECTOMY FUSION STRUCTURAL ALLOGRAFT TRESTLE PLATE CERVICAL FIVE-SIX,SIX-SEVEN;  Surgeon: Otilio Connors, MD;  Location: [facility];  Service: Neurosurgery;  Laterality: N/A;  . Lumbar laminectomy/decompression microdiscectomy Left 11/30/2014    Procedure: Left lumbar four-five Microdiskectomy;  Surgeon: Kristeen Miss, MD;  Location: [facility];  Service: Neurosurgery;  Laterality: Left;   Family History  Problem Relation Age of Onset  . Prostate cancer Maternal Grandfather   . Cancer Maternal Grandfather     prostate  . Colon polyps Maternal Grandmother   . Diabetes Mother   . Endometriosis Mother   . Cancer Mother     lung  . Diabetes Father   . Heart disease Father   . Cirrhosis Paternal Grandfather    Social History  Substance Use Topics  . Smoking status: Current Every Day  Smoker -- 0.33 packs/day for 12 years    Types: Cigarettes  . Smokeless tobacco: Never Used     Comment: trying to quit  . Alcohol Use: No     Comment: wine cooler for occasion   OB History    No data available     Review of Systems  Constitutional: Negative.   HENT: Positive for congestion and rhinorrhea. Negative for dental problem, drooling, ear discharge, ear pain, hearing loss, mouth sores, nosebleeds, postnasal drip, sinus pressure, sneezing, sore throat, tinnitus, trouble swallowing and voice change.   Eyes: Negative.   Respiratory: Negative.    Cardiovascular: Negative.   Gastrointestinal: Positive for nausea, vomiting, abdominal pain and diarrhea. Negative for constipation, blood in stool, abdominal distention, anal bleeding and rectal pain.  Genitourinary: Negative.   Skin: Negative.   Neurological: Negative.       Allergies  Banana; Orange oil; Latex; and Miconazole  Home Medications   Prior to Admission medications   Medication Sig Start Date End Date Taking? Authorizing Provider  ALPRAZolam Duanne Moron) 0.5 MG tablet Take 0.5 mg by mouth 2 (two) times daily.    Yes Historical Provider, MD  ARIPiprazole (ABILIFY) 5 MG tablet Take 5 mg by mouth daily.   Yes Historical Provider, MD  busPIRone (BUSPAR) 15 MG tablet Take 15 mg by mouth 2 (two) times daily.   Yes Historical Provider, MD  diclofenac (VOLTAREN) 75 MG EC tablet Take 75 mg by mouth 2 (two) times daily.   Yes Historical Provider, MD  esomeprazole (NEXIUM) 40 MG capsule Take 40 mg by mouth daily.    Yes Historical Provider, MD  FLUoxetine (PROZAC) 20 MG capsule Take 80 mg by mouth daily.    Yes Historical Provider, MD  prazosin (MINIPRESS) 2 MG capsule Take 2 mg by mouth at bedtime.    Yes Historical Provider, MD  traZODone (DESYREL) 50 MG tablet Take 100 mg by mouth at bedtime.    Yes Historical Provider, MD  zolpidem (AMBIEN) 10 MG tablet Take 10 mg by mouth at bedtime as needed for sleep.   Yes Historical Provider, MD  cetirizine (ZYRTEC) 5 MG tablet Take 1 tablet (5 mg total) by mouth daily. 06/03/15   Anikin Prosser A Lincoln Brigham, MD  ondansetron (ZOFRAN) 4 MG tablet Take 1 tablet (4 mg total) by mouth every 8 (eight) hours as needed for nausea or vomiting. 06/03/15   Veatrice Bourbon, MD  oxyCODONE-acetaminophen (PERCOCET/ROXICET) 5-325 MG per tablet Take 1-2 tablets by mouth every 4 (four) hours as needed for moderate pain. Patient not taking: Reported on 06/03/2015 12/01/14   Kristeen Miss, MD   BP 105/55 mmHg  Pulse 74  Temp(Src) 98.8 F (37.1 C) (Oral)  Resp 16  Ht 5\' 3"  (1.6  m)  Wt 117.935 kg  BMI 46.07 kg/m2  SpO2 97%  LMP 05/10/2015 Physical Exam  Constitutional: She appears well-developed and well-nourished.  HENT:  Head: Normocephalic and atraumatic.  Eyes: Conjunctivae are normal. Pupils are equal, round, and reactive to light.  Cardiovascular: Normal rate and regular rhythm.   No murmur heard. Pulmonary/Chest: Effort normal and breath sounds normal.  Abdominal: Soft. Bowel sounds are normal. She exhibits no distension.  Obese, mild epigastric tenderness, no rebound or guading  Neurological: She is alert.  Skin: Skin is warm and dry.    ED Course  Procedures (including critical care time) Labs Review Labs Reviewed  COMPREHENSIVE METABOLIC PANEL - Abnormal; Notable for the following:    CO2 21 (*)  All other components within normal limits  CBC - Abnormal; Notable for the following:    WBC 11.0 (*)    MCV 75.6 (*)    MCH 24.4 (*)    RDW 18.4 (*)    All other components within normal limits  URINALYSIS, ROUTINE W REFLEX MICROSCOPIC (NOT AT [facility]) - Abnormal; Notable for the following:    Color, Urine AMBER (*)    APPearance CLOUDY (*)    Bilirubin Urine SMALL (*)    Ketones, ur 15 (*)    Protein, ur 30 (*)    Leukocytes, UA TRACE (*)    All other components within normal limits  URINE MICROSCOPIC-ADD ON - Abnormal; Notable for the following:    Squamous Epithelial / LPF 6-30 (*)    All other components within normal limits  LIPASE, BLOOD  URINE RAPID DRUG SCREEN, HOSP PERFORMED  I-STAT BETA HCG BLOOD, ED ([facility], [facility], [facility] ONLY)    Imaging Review No results found. I have personally reviewed and evaluated these images and lab results as part of my medical decision-making.   EKG Interpretation None      MDM   Final diagnoses:  Gastroenteritis  Seasonal allergies   31 y/o F presenting for nausea and emesis after eating a sandwich at [employer] consistent with food borne illness.   Nausea improved after zofran and fluid bolus.  She was able to tolerate liquid and solid PO without issue. She had one loose brown stool in the ED   Additionally she reports allergy symptoms consistent with typical allergy presentation. Differential includes viral URI, unlikely the flu given lack of fevers and time course. Zyrtec given in the ED with an Rx given for symptoms control and counseling to follow wit her PCP.   Areeba Sulser A. Lincoln Brigham MD, [facility] Resident PGY-2 Pager [phone]        Veatrice Bourbon, MD 06/03/15 1210  Leonard Schwartz, MD 06/03/15 [place], MD 06/03/15 Emily, MD 06/12/15 [phone]

## 2015-06-03 NOTE — ED Notes (Signed)
Pt reports that she had nasal congestion for a while. Yesterday ate a samdwich from [employer] and began vomiting. Pt c/o abdominal pain and flare up of sciatica from vomiting.

## 2015-06-03 NOTE — ED Notes (Signed)
PT called for in waiting room. No Response

## 2015-06-10 ENCOUNTER — Emergency Department (HOSPITAL_COMMUNITY)
Admission: EM | Admit: 2015-06-10 | Discharge: 2015-06-10 | Disposition: A | Discharge status: Home / Self Care | Payer: Medicaid Other | Attending: Emergency Medicine | Admitting: Emergency Medicine

## 2015-06-10 ENCOUNTER — Encounter (HOSPITAL_COMMUNITY): Payer: Self-pay | Admitting: Emergency Medicine

## 2015-06-10 DIAGNOSIS — Z791 Long term (current) use of non-steroidal anti-inflammatories (NSAID): Secondary | ICD-10-CM | POA: Diagnosis not present

## 2015-06-10 DIAGNOSIS — F410 Panic disorder [episodic paroxysmal anxiety] without agoraphobia: Secondary | ICD-10-CM | POA: Insufficient documentation

## 2015-06-10 DIAGNOSIS — Z862 Personal history of diseases of the blood and blood-forming organs and certain disorders involving the immune mechanism: Secondary | ICD-10-CM | POA: Insufficient documentation

## 2015-06-10 DIAGNOSIS — E669 Obesity, unspecified: Secondary | ICD-10-CM | POA: Diagnosis not present

## 2015-06-10 DIAGNOSIS — Z8541 Personal history of malignant neoplasm of cervix uteri: Secondary | ICD-10-CM | POA: Insufficient documentation

## 2015-06-10 DIAGNOSIS — G8929 Other chronic pain: Secondary | ICD-10-CM | POA: Diagnosis not present

## 2015-06-10 DIAGNOSIS — J45909 Unspecified asthma, uncomplicated: Secondary | ICD-10-CM | POA: Insufficient documentation

## 2015-06-10 DIAGNOSIS — Z8742 Personal history of other diseases of the female genital tract: Secondary | ICD-10-CM | POA: Diagnosis not present

## 2015-06-10 DIAGNOSIS — F4310 Post-traumatic stress disorder, unspecified: Secondary | ICD-10-CM | POA: Insufficient documentation

## 2015-06-10 DIAGNOSIS — F17210 Nicotine dependence, cigarettes, uncomplicated: Secondary | ICD-10-CM | POA: Diagnosis not present

## 2015-06-10 DIAGNOSIS — M5432 Sciatica, left side: Secondary | ICD-10-CM | POA: Diagnosis not present

## 2015-06-10 DIAGNOSIS — M545 Low back pain: Secondary | ICD-10-CM | POA: Diagnosis present

## 2015-06-10 DIAGNOSIS — Z91040 Latex allergy status: Secondary | ICD-10-CM | POA: Diagnosis not present

## 2015-06-10 DIAGNOSIS — G43909 Migraine, unspecified, not intractable, without status migrainosus: Secondary | ICD-10-CM | POA: Insufficient documentation

## 2015-06-10 DIAGNOSIS — Z79899 Other long term (current) drug therapy: Secondary | ICD-10-CM | POA: Insufficient documentation

## 2015-06-10 DIAGNOSIS — K219 Gastro-esophageal reflux disease without esophagitis: Secondary | ICD-10-CM | POA: Insufficient documentation

## 2015-06-10 DIAGNOSIS — Z8619 Personal history of other infectious and parasitic diseases: Secondary | ICD-10-CM | POA: Diagnosis not present

## 2015-06-10 MED ORDER — OXYCODONE-ACETAMINOPHEN 5-325 MG PO TABS: 2.0000 | ORAL_TABLET | Freq: Once | ORAL | Status: DC

## 2015-06-10 MED ORDER — OXYCODONE-ACETAMINOPHEN 5-325 MG PO TABS: 1.0000 | ORAL_TABLET | ORAL | Status: DC | PRN

## 2015-06-10 MED ORDER — DEXAMETHASONE SODIUM PHOSPHATE 10 MG/ML IJ SOLN
10.0000 mg | Freq: Once | INTRAMUSCULAR | Status: AC
  Administered 2015-06-10: 10 mg via INTRAMUSCULAR
  Filled 2015-06-10: qty 1

## 2015-06-10 MED ORDER — KETOROLAC TROMETHAMINE 60 MG/2ML IM SOLN
60.0000 mg | Freq: Once | INTRAMUSCULAR | Status: AC
  Administered 2015-06-10: 60 mg via INTRAMUSCULAR
  Filled 2015-06-10: qty 2

## 2015-06-10 NOTE — ED Notes (Signed)
Hx of back surgery in Sept 2016 by Dr.Elsner. States had severe coughing episode in Dec. And had sudden LBP with radiation to left leg. Saw Dr. Ellene Route in January. Told to "do stretches". Has another appointment in April with Dr. Ellene Route.

## 2015-06-10 NOTE — ED Notes (Signed)
Patient states she had herniated disc surgery in September.  Patient states started having sciatica in December diagnosed by neuro.   Patient states she also has additional chronic back problems.   Patient states lower back pain going down into L buttock.   Patient denies urinary symptoms.   No loss of continence.

## 2015-06-10 NOTE — Discharge Instructions (Signed)
Sciatica °Sciatica is pain, weakness, numbness, or tingling along the path of the sciatic nerve. The nerve starts in the lower back and runs down the back of each leg. The nerve controls the muscles in the lower leg and in the back of the knee, while also providing sensation to the back of the thigh, lower leg, and the sole of your foot. Sciatica is a symptom of another medical condition. For instance, nerve damage or certain conditions, such as a herniated disk or bone spur on the spine, pinch or put pressure on the sciatic nerve. This causes the pain, weakness, or other sensations normally associated with sciatica. Generally, sciatica only affects one side of the body. °CAUSES  °· Herniated or slipped disc. °· Degenerative disk disease. °· A pain disorder involving the narrow muscle in the buttocks (piriformis syndrome). °· Pelvic injury or fracture. °· Pregnancy. °· Tumor (rare). °SYMPTOMS  °Symptoms can vary from mild to very severe. The symptoms usually travel from the low back to the buttocks and down the back of the leg. Symptoms can include: °· Mild tingling or dull aches in the lower back, leg, or hip. °· Numbness in the back of the calf or sole of the foot. °· Burning sensations in the lower back, leg, or hip. °· Sharp pains in the lower back, leg, or hip. °· Leg weakness. °· Severe back pain inhibiting movement. °These symptoms may get worse with coughing, sneezing, laughing, or prolonged sitting or standing. Also, being overweight may worsen symptoms. °DIAGNOSIS  °Your caregiver will perform a physical exam to look for common symptoms of sciatica. He or she may ask you to do certain movements or activities that would trigger sciatic nerve pain. Other tests may be performed to find the cause of the sciatica. These may include: °· Blood tests. °· X-rays. °· Imaging tests, such as an MRI or CT scan. °TREATMENT  °Treatment is directed at the cause of the sciatic pain. Sometimes, treatment is not necessary  and the pain and discomfort goes away on its own. If treatment is needed, your caregiver may suggest: °· Over-the-counter medicines to relieve pain. °· Prescription medicines, such as anti-inflammatory medicine, muscle relaxants, or narcotics. °· Applying heat or ice to the painful area. °· Steroid injections to lessen pain, irritation, and inflammation around the nerve. °· Reducing activity during periods of pain. °· Exercising and stretching to strengthen your abdomen and improve flexibility of your spine. Your caregiver may suggest losing weight if the extra weight makes the back pain worse. °· Physical therapy. °· Surgery to eliminate what is pressing or pinching the nerve, such as a bone spur or part of a herniated disk. °HOME CARE INSTRUCTIONS  °· Only take over-the-counter or prescription medicines for pain or discomfort as directed by your caregiver. °· Apply ice to the affected area for 20 minutes, 3-4 times a day for the first 48-72 hours. Then try heat in the same way. °· Exercise, stretch, or perform your usual activities if these do not aggravate your pain. °· Attend physical therapy sessions as directed by your caregiver. °· Keep all follow-up appointments as directed by your caregiver. °· Do not wear high heels or shoes that do not provide proper support. °· Check your mattress to see if it is too soft. A firm mattress may lessen your pain and discomfort. °SEEK IMMEDIATE MEDICAL CARE IF:  °· You lose control of your bowel or bladder (incontinence). °· You have increasing weakness in the lower back, pelvis, buttocks,   or legs.  You have redness or swelling of your back.  You have a burning sensation when you urinate.  You have pain that gets worse when you lie down or awakens you at night.  Your pain is worse than you have experienced in the past.  Your pain is lasting longer than 4 weeks.  You are suddenly losing weight without reason. MAKE SURE YOU:  Understand these  instructions.  Will watch your condition.  Will get help right away if you are not doing well or get worse.   This information is not intended to replace advice given to you by your health care provider. Make sure you discuss any questions you have with your health care provider.   Document Released: 03/06/2001 Document Revised: 12/01/2014 Document Reviewed: 07/22/2011 Elsevier Interactive Patient Education 2016 [place] The sciatic nerve runs from the back down the leg and is responsible for sensation and control of the muscles in the back (posterior) side of the thigh, lower leg, and foot. Sciatica is a condition that is characterized by inflammation of this nerve.  SYMPTOMS   Signs of nerve damage, including numbness and/or weakness along the posterior side of the lower extremity.  Pain in the back of the thigh that may also travel down the leg.  Pain that worsens when sitting for long periods of time.  Occasionally, pain in the back or buttock. CAUSES  Inflammation of the sciatic nerve is the cause of sciatica. The inflammation is due to something irritating the nerve. Common sources of irritation include:  Sitting for long periods of time.  Direct trauma to the nerve.  Arthritis of the spine.  Herniated or ruptured disk.  Slipping of the vertebrae (spondylolisthesis).  Pressure from soft tissues, such as muscles or ligament-like tissue (fascia). RISK INCREASES WITH:  Sports that place pressure or stress on the spine (football or weightlifting).  Poor strength and flexibility.  Failure to warm up properly before activity.  Family history of low back pain or disk disorders.  Previous back injury or surgery.  Poor body mechanics, especially when lifting, or poor posture. PREVENTION   Warm up and stretch properly before activity.  Maintain physical fitness:  Strength, flexibility, and endurance.  Cardiovascular fitness.  Learn and  use proper technique, especially with posture and lifting. When possible, have coach correct improper technique.  Avoid activities that place stress on the spine. PROGNOSIS If treated properly, then sciatica usually resolves within 6 weeks. However, occasionally surgery is necessary.  RELATED COMPLICATIONS   Permanent nerve damage, including pain, numbness, tingle, or weakness.  Chronic back pain.  Risks of surgery: infection, bleeding, nerve damage, or damage to surrounding tissues. TREATMENT Treatment initially involves resting from any activities that aggravate your symptoms. The use of ice and medication may help reduce pain and inflammation. The use of strengthening and stretching exercises may help reduce pain with activity. These exercises may be performed at home or with referral to a therapist. A therapist may recommend further treatments, such as transcutaneous electronic nerve stimulation (TENS) or ultrasound. Your caregiver may recommend corticosteroid injections to help reduce inflammation of the sciatic nerve. If symptoms persist despite non-surgical (conservative) treatment, then surgery may be recommended. MEDICATION  If pain medication is necessary, then nonsteroidal anti-inflammatory medications, such as aspirin and ibuprofen, or other minor pain relievers, such as acetaminophen, are often recommended.  Do not take pain medication for 7 days before surgery.  Prescription pain relievers may be given if  deemed necessary by your caregiver. Use only as directed and only as much as you need.  Ointments applied to the skin may be helpful.  Corticosteroid injections may be given by your caregiver. These injections should be reserved for the most serious cases, because they may only be given a certain number of times. HEAT AND COLD  Cold treatment (icing) relieves pain and reduces inflammation. Cold treatment should be applied for 10 to 15 minutes every 2 to 3 hours for  inflammation and pain and immediately after any activity that aggravates your symptoms. Use ice packs or massage the area with a piece of ice (ice massage).  Heat treatment may be used prior to performing the stretching and strengthening activities prescribed by your caregiver, physical therapist, or athletic trainer. Use a heat pack or soak the injury in warm water. SEEK MEDICAL CARE IF:  Treatment seems to offer no benefit, or the condition worsens.  Any medications produce adverse side effects. EXERCISES  RANGE OF MOTION (ROM) AND STRETCHING EXERCISES - Sciatica Most people with sciatic will find that their symptoms worsen with either excessive bending forward (flexion) or arching at the low back (extension). The exercises which will help resolve your symptoms will focus on the opposite motion. Your physician, physical therapist or athletic trainer will help you determine which exercises will be most helpful to resolve your low back pain. Do not complete any exercises without first consulting with your clinician. Discontinue any exercises which worsen your symptoms until you speak to your clinician. If you have pain, numbness or tingling which travels down into your buttocks, leg or foot, the goal of the therapy is for these symptoms to move closer to your back and eventually resolve. Occasionally, these leg symptoms will get better, but your low back pain may worsen; this is typically an indication of progress in your rehabilitation. Be certain to be very alert to any changes in your symptoms and the activities in which you participated in the 24 hours prior to the change. Sharing this information with your clinician will allow him/her to most efficiently treat your condition. These exercises may help you when beginning to rehabilitate your injury. Your symptoms may resolve with or without further involvement from your physician, physical therapist or athletic trainer. While completing these  exercises, remember:   Restoring tissue flexibility helps normal motion to return to the joints. This allows healthier, less painful movement and activity.  An effective stretch should be held for at least 30 seconds.  A stretch should never be painful. You should only feel a gentle lengthening or release in the stretched tissue. FLEXION RANGE OF MOTION AND STRETCHING EXERCISES: STRETCH - Flexion, Single Knee to Chest   Lie on a firm bed or floor with both legs extended in front of you.  Keeping one leg in contact with the floor, bring your opposite knee to your chest. Hold your leg in place by either grabbing behind your thigh or at your knee.  Pull until you feel a gentle stretch in your low back. Hold __________ seconds.  Slowly release your grasp and repeat the exercise with the opposite side. Repeat __________ times. Complete this exercise __________ times per day.  STRETCH - Flexion, Double Knee to Chest  Lie on a firm bed or floor with both legs extended in front of you.  Keeping one leg in contact with the floor, bring your opposite knee to your chest.  Tense your stomach muscles to support your back and then lift  your other knee to your chest. Hold your legs in place by either grabbing behind your thighs or at your knees.  Pull both knees toward your chest until you feel a gentle stretch in your low back. Hold __________ seconds.  Tense your stomach muscles and slowly return one leg at a time to the floor. Repeat __________ times. Complete this exercise __________ times per day.  STRETCH - Low Trunk Rotation   Lie on a firm bed or floor. Keeping your legs in front of you, bend your knees so they are both pointed toward the ceiling and your feet are flat on the floor.  Extend your arms out to the side. This will stabilize your upper body by keeping your shoulders in contact with the floor.  Gently and slowly drop both knees together to one side until you feel a gentle  stretch in your low back. Hold for __________ seconds.  Tense your stomach muscles to support your low back as you bring your knees back to the starting position. Repeat the exercise to the other side. Repeat __________ times. Complete this exercise __________ times per day  EXTENSION RANGE OF MOTION AND FLEXIBILITY EXERCISES: STRETCH - Extension, Prone on Elbows  Lie on your stomach on the floor, a bed will be too soft. Place your palms about shoulder width apart and at the height of your head.  Place your elbows under your shoulders. If this is too painful, stack pillows under your chest.  Allow your body to relax so that your hips drop lower and make contact more completely with the floor.  Hold this position for __________ seconds.  Slowly return to lying flat on the floor. Repeat __________ times. Complete this exercise __________ times per day.  RANGE OF MOTION - Extension, Prone Press Ups  Lie on your stomach on the floor, a bed will be too soft. Place your palms about shoulder width apart and at the height of your head.  Keeping your back as relaxed as possible, slowly straighten your elbows while keeping your hips on the floor. You may adjust the placement of your hands to maximize your comfort. As you gain motion, your hands will come more underneath your shoulders.  Hold this position __________ seconds.  Slowly return to lying flat on the floor. Repeat __________ times. Complete this exercise __________ times per day.  STRENGTHENING EXERCISES - Sciatica  These exercises may help you when beginning to rehabilitate your injury. These exercises should be done near your "sweet spot." This is the neutral, low-back arch, somewhere between fully rounded and fully arched, that is your least painful position. When performed in this safe range of motion, these exercises can be used for people who have either a flexion or extension based injury. These exercises may resolve your symptoms  with or without further involvement from your physician, physical therapist or athletic trainer. While completing these exercises, remember:   Muscles can gain both the endurance and the strength needed for everyday activities through controlled exercises.  Complete these exercises as instructed by your physician, physical therapist or athletic trainer. Progress with the resistance and repetition exercises only as your caregiver advises.  You may experience muscle soreness or fatigue, but the pain or discomfort you are trying to eliminate should never worsen during these exercises. If this pain does worsen, stop and make certain you are following the directions exactly. If the pain is still present after adjustments, discontinue the exercise until you can discuss the trouble with your  clinician. STRENGTHENING - Deep Abdominals, Pelvic Tilt   Lie on a firm bed or floor. Keeping your legs in front of you, bend your knees so they are both pointed toward the ceiling and your feet are flat on the floor.  Tense your lower abdominal muscles to press your low back into the floor. This motion will rotate your pelvis so that your tail bone is scooping upwards rather than pointing at your feet or into the floor.  With a gentle tension and even breathing, hold this position for __________ seconds. Repeat __________ times. Complete this exercise __________ times per day.  STRENGTHENING - Abdominals, Crunches   Lie on a firm bed or floor. Keeping your legs in front of you, bend your knees so they are both pointed toward the ceiling and your feet are flat on the floor. Cross your arms over your chest.  Slightly tip your chin down without bending your neck.  Tense your abdominals and slowly lift your trunk high enough to just clear your shoulder blades. Lifting higher can put excessive stress on the low back and does not further strengthen your abdominal muscles.  Control your return to the starting  position. Repeat __________ times. Complete this exercise __________ times per day.  STRENGTHENING - Quadruped, Opposite UE/LE Lift  Assume a hands and knees position on a firm surface. Keep your hands under your shoulders and your knees under your hips. You may place padding under your knees for comfort.  Find your neutral spine and gently tense your abdominal muscles so that you can maintain this position. Your shoulders and hips should form a rectangle that is parallel with the floor and is not twisted.  Keeping your trunk steady, lift your right hand no higher than your shoulder and then your left leg no higher than your hip. Make sure you are not holding your breath. Hold this position __________ seconds.  Continuing to keep your abdominal muscles tense and your back steady, slowly return to your starting position. Repeat with the opposite arm and leg. Repeat __________ times. Complete this exercise __________ times per day.  STRENGTHENING - Abdominals and Quadriceps, Straight Leg Raise   Lie on a firm bed or floor with both legs extended in front of you.  Keeping one leg in contact with the floor, bend the other knee so that your foot can rest flat on the floor.  Find your neutral spine, and tense your abdominal muscles to maintain your spinal position throughout the exercise.  Slowly lift your straight leg off the floor about 6 inches for a count of 15, making sure to not hold your breath.  Still keeping your neutral spine, slowly lower your leg all the way to the floor. Repeat this exercise with each leg __________ times. Complete this exercise __________ times per day. POSTURE AND BODY MECHANICS CONSIDERATIONS - Sciatica Keeping correct posture when sitting, standing or completing your activities will reduce the stress put on different body tissues, allowing injured tissues a chance to heal and limiting painful experiences. The following are general guidelines for improved posture.  Your physician or physical therapist will provide you with any instructions specific to your needs. While reading these guidelines, remember:  The exercises prescribed by your provider will help you have the flexibility and strength to maintain correct postures.  The correct posture provides the optimal environment for your joints to work. All of your joints have less wear and tear when properly supported by a spine with good posture. This  means you will experience a healthier, less painful body.  Correct posture must be practiced with all of your activities, especially prolonged sitting and standing. Correct posture is as important when doing repetitive low-stress activities (typing) as it is when doing a single heavy-load activity (lifting). RESTING POSITIONS Consider which positions are most painful for you when choosing a resting position. If you have pain with flexion-based activities (sitting, bending, stooping, squatting), choose a position that allows you to rest in a less flexed posture. You would want to avoid curling into a fetal position on your side. If your pain worsens with extension-based activities (prolonged standing, working overhead), avoid resting in an extended position such as sleeping on your stomach. Most people will find more comfort when they rest with their spine in a more neutral position, neither too rounded nor too arched. Lying on a non-sagging bed on your side with a pillow between your knees, or on your back with a pillow under your knees will often provide some relief. Keep in mind, being in any one position for a prolonged period of time, no matter how correct your posture, can still lead to stiffness. PROPER SITTING POSTURE In order to minimize stress and discomfort on your spine, you must sit with correct posture Sitting with good posture should be effortless for a healthy body. Returning to good posture is a gradual process. Many people can work toward this most  comfortably by using various supports until they have the flexibility and strength to maintain this posture on their own. When sitting with proper posture, your ears will fall over your shoulders and your shoulders will fall over your hips. You should use the back of the chair to support your upper back. Your low back will be in a neutral position, just slightly arched. You may place a small pillow or folded towel at the base of your low back for support.  When working at a desk, create an environment that supports good, upright posture. Without extra support, muscles fatigue and lead to excessive strain on joints and other tissues. Keep these recommendations in mind: CHAIR:   A chair should be able to slide under your desk when your back makes contact with the back of the chair. This allows you to work closely.  The chair's height should allow your eyes to be level with the upper part of your monitor and your hands to be slightly lower than your elbows. BODY POSITION  Your feet should make contact with the floor. If this is not possible, use a foot rest.  Keep your ears over your shoulders. This will reduce stress on your neck and low back. INCORRECT SITTING POSTURES   If you are feeling tired and unable to assume a healthy sitting posture, do not slouch or slump. This puts excessive strain on your back tissues, causing more damage and pain. Healthier options include:  Using more support, like a lumbar pillow.  Switching tasks to something that requires you to be upright or walking.  Talking a brief walk.  Lying down to rest in a neutral-spine position. PROLONGED STANDING WHILE SLIGHTLY LEANING FORWARD  When completing a task that requires you to lean forward while standing in one place for a long time, place either foot up on a stationary 2-4 inch high object to help maintain the best posture. When both feet are on the ground, the low back tends to lose its slight inward curve. If this  curve flattens (or becomes too large), then the  back and your other joints will experience too much stress, fatigue more quickly and can cause pain.  CORRECT STANDING POSTURES Proper standing posture should be assumed with all daily activities, even if they only take a few moments, like when brushing your teeth. As in sitting, your ears should fall over your shoulders and your shoulders should fall over your hips. You should keep a slight tension in your abdominal muscles to brace your spine. Your tailbone should point down to the ground, not behind your body, resulting in an over-extended swayback posture.  INCORRECT STANDING POSTURES  Common incorrect standing postures include a forward head, locked knees and/or an excessive swayback. WALKING Walk with an upright posture. Your ears, shoulders and hips should all line-up. PROLONGED ACTIVITY IN A FLEXED POSITION When completing a task that requires you to bend forward at your waist or lean over a low surface, try to find a way to stabilize 3 of 4 of your limbs. You can place a hand or elbow on your thigh or rest a knee on the surface you are reaching across. This will provide you more stability so that your muscles do not fatigue as quickly. By keeping your knees relaxed, or slightly bent, you will also reduce stress across your low back. CORRECT LIFTING TECHNIQUES DO :   Assume a wide stance. This will provide you more stability and the opportunity to get as close as possible to the object which you are lifting.  Tense your abdominals to brace your spine; then bend at the knees and hips. Keeping your back locked in a neutral-spine position, lift using your leg muscles. Lift with your legs, keeping your back straight.  Test the weight of unknown objects before attempting to lift them.  Try to keep your elbows locked down at your sides in order get the best strength from your shoulders when carrying an object.  Always ask for help when lifting  heavy or awkward objects. INCORRECT LIFTING TECHNIQUES DO NOT:   Lock your knees when lifting, even if it is a small object.  Bend and twist. Pivot at your feet or move your feet when needing to change directions.  Assume that you cannot safely pick up a paperclip without proper posture.   This information is not intended to replace advice given to you by your health care provider. Make sure you discuss any questions you have with your health care provider.   Document Released: 03/12/2005 Document Revised: 07/27/2014 Document Reviewed: 06/24/2008 Elsevier Interactive Patient Education [employer].

## 2015-06-10 NOTE — ED Notes (Signed)
Pt is driving. Chose to wait and get Rx filled.

## 2015-06-10 NOTE — ED Provider Notes (Signed)
CSN: [id number]     Arrival date & time 06/10/15  [id number] History  By signing my name below, I, Essence Howell, attest that this documentation has been prepared under the direction and in the presence of Margarita Mail, PA-C Electronically Signed: Ladene Artist, ED Scribe 06/10/2015 at 9:49 AM.   Chief Complaint  Patient presents with  . Sciatica   The history is provided by the patient. No language interpreter was used.   HPI Comments: [name] is a 30 y.o. female who presents to the Emergency Department complaining of acute on chronic low back pain onset a few days ago.  Pt states that low back pain radiates into her left buttock and into her left calf. She is able to ambulate while bent over, but reports increased pain with this. Pshx includes lumber laminectomy/decompression microdiscectomy in September by Dr. Ellene Route. Pt states that she re-injured her back after a coughing episode in December 2016. Pt states that she went to Dr. Clarice Pole office for pain medication on 05/16/15 but states that she rescheduled for 06/20/15 since the wait was too long.    Past Medical History  Diagnosis Date  . Scoliosis   . Obesity   . Migraine   . Gallstones   . Asthma   . Arthritis   . Pelvic inflammatory disease (PID) 05-08-11    previous hx. .Hx. childbirth x3-NVD  . Leukocytosis     going to hematologist  . History of renal failure   . Edema   . History of anemia   . Cancer ([place])   . Cervical cancer ([place])   . Anxiety     Panic attack  . DDD (degenerative disc disease), cervical   . PTSD (post-traumatic stress disorder)   . GERD (gastroesophageal reflux disease)   . Constipation   . PID (pelvic inflammatory disease)   . HPV (human papilloma virus) anogenital infection    Past Surgical History  Procedure Laterality Date  . Abdominal exploration surgery  approx 30 years old    rlq(due to adhesions around ovaries)-appendix and ovaries remain  . Cholecystectomy  05/10/2011    Procedure:  LAPAROSCOPIC CHOLECYSTECTOMY WITH INTRAOPERATIVE CHOLANGIOGRAM;  Surgeon: Gayland Curry, MD,FACS;  Location: [facility];  Service: General;  Laterality: N/A;  . Anterior cervical decomp/discectomy fusion N/A 12/09/2012    Procedure: ANTERIOR CERVICAL DECOMPRESSION/DISCECTOMY FUSION STRUCTURAL ALLOGRAFT TRESTLE PLATE CERVICAL FIVE-SIX,SIX-SEVEN;  Surgeon: Otilio Connors, MD;  Location: [facility];  Service: Neurosurgery;  Laterality: N/A;  . Lumbar laminectomy/decompression microdiscectomy Left 11/30/2014    Procedure: Left lumbar four-five Microdiskectomy;  Surgeon: Kristeen Miss, MD;  Location: [facility];  Service: Neurosurgery;  Laterality: Left;   Family History  Problem Relation Age of Onset  . Prostate cancer Maternal Grandfather   . Cancer Maternal Grandfather     prostate  . Colon polyps Maternal Grandmother   . Diabetes Mother   . Endometriosis Mother   . Cancer Mother     lung  . Diabetes Father   . Heart disease Father   . Cirrhosis Paternal Grandfather    Social History  Substance Use Topics  . Smoking status: Current Every Day Smoker -- 0.50 packs/day for 12 years    Types: Cigarettes  . Smokeless tobacco: Never Used     Comment: trying to quit  . Alcohol Use: No     Comment: wine cooler for occasion   OB History    No data available     Review of Systems  Constitutional:  Negative for fever.  Musculoskeletal: Positive for back pain.   Allergies  Banana; Orange oil; Latex; and Miconazole  Home Medications   Prior to Admission medications   Medication Sig Start Date End Date Taking? Authorizing Provider  ARIPiprazole (ABILIFY) 5 MG tablet Take 5 mg by mouth daily.   Yes Historical Provider, MD  busPIRone (BUSPAR) 15 MG tablet Take 15 mg by mouth 2 (two) times daily.   Yes Historical Provider, MD  diclofenac (VOLTAREN) 75 MG EC tablet Take 75 mg by mouth 2 (two) times daily.   Yes Historical Provider, MD  esomeprazole (NEXIUM) 40 MG capsule Take 40 mg by mouth  daily.    Yes Historical Provider, MD  FLUoxetine (PROZAC) 20 MG capsule Take 80 mg by mouth daily.    Yes Historical Provider, MD  prazosin (MINIPRESS) 2 MG capsule Take 2 mg by mouth at bedtime.    Yes Historical Provider, MD  traZODone (DESYREL) 50 MG tablet Take 100 mg by mouth at bedtime.    Yes Historical Provider, MD  ALPRAZolam Duanne Moron) 0.5 MG tablet Take 0.5 mg by mouth 2 (two) times daily.     Historical Provider, MD  cetirizine (ZYRTEC) 5 MG tablet Take 1 tablet (5 mg total) by mouth daily. 06/03/15   Alyssa A Lincoln Brigham, MD  ondansetron (ZOFRAN) 4 MG tablet Take 1 tablet (4 mg total) by mouth every 8 (eight) hours as needed for nausea or vomiting. 06/03/15   Veatrice Bourbon, MD  oxyCODONE-acetaminophen (PERCOCET/ROXICET) 5-325 MG per tablet Take 1-2 tablets by mouth every 4 (four) hours as needed for moderate pain. Patient not taking: Reported on 06/03/2015 12/01/14   Kristeen Miss, MD  zolpidem (AMBIEN) 10 MG tablet Take 10 mg by mouth at bedtime as needed for sleep.    Historical Provider, MD   BP 119/70 mmHg  Pulse 75  Temp(Src) 97.9 F (36.6 C) (Oral)  Resp 18  Ht 5\' 3"  (1.6 m)  Wt 260 lb (117.935 kg)  BMI 46.07 kg/m2  SpO2 98%  LMP 05/10/2015 Physical Exam  Constitutional: She is oriented to person, place, and time. She appears well-developed and well-nourished. No distress.  Appears uncomfortable  HENT:  Head: Normocephalic and atraumatic.  Eyes: Conjunctivae and EOM are normal.  Neck: Neck supple. No tracheal deviation present.  Cardiovascular: Normal rate.   Pulmonary/Chest: Effort normal. No respiratory distress.  Musculoskeletal: Normal range of motion.  + straight leg raise on the L TTP Lumbar paraspinals and gluteus muscles. No midline tenderness.  Neurological: She is alert and oriented to person, place, and time. She has normal reflexes.  Skin: Skin is warm and dry.  Psychiatric: She has a normal mood and affect. Her behavior is normal.  Nursing note and vitals  reviewed.  ED Course  Procedures (including critical care time) DIAGNOSTIC STUDIES: Oxygen Saturation is 98% on RA, normal by my interpretation.    COORDINATION OF CARE: 9:39 AM-Discussed treatment plan which includes Percocet, Decadron and Toradol injections with pt at bedside and pt agreed to plan.   Labs Review Labs Reviewed - No data to display  Imaging Review No results found.   EKG Interpretation None      MDM   Final diagnoses:  Sciatica of left side    Patient with back pain.  No neurological deficits and normal neuro exam.  Patient can walk but states is painful.  No loss of bowel or bladder control.  No concern for cauda equina.  No fever, night sweats, weight loss,  h/o cancer, IVDU.  RICE protocol and pain medicine indicated and discussed with patient.    I personally performed the services described in this documentation, which was scribed in my presence. The recorded information has been reviewed and is accurate.       Margarita Mail, PA-C 06/10/15 [place], MD 06/10/15 [phone]

## 2015-06-18 ENCOUNTER — Encounter (HOSPITAL_COMMUNITY): Payer: Self-pay | Admitting: Emergency Medicine

## 2015-06-18 ENCOUNTER — Emergency Department (HOSPITAL_COMMUNITY)
Admission: EM | Admit: 2015-06-18 | Discharge: 2015-06-18 | Disposition: A | Discharge status: Home / Self Care | Payer: Medicaid Other | Attending: Emergency Medicine | Admitting: Emergency Medicine

## 2015-06-18 ENCOUNTER — Emergency Department (HOSPITAL_COMMUNITY): Payer: Medicaid Other

## 2015-06-18 DIAGNOSIS — Z8541 Personal history of malignant neoplasm of cervix uteri: Secondary | ICD-10-CM | POA: Insufficient documentation

## 2015-06-18 DIAGNOSIS — Z8742 Personal history of other diseases of the female genital tract: Secondary | ICD-10-CM | POA: Diagnosis not present

## 2015-06-18 DIAGNOSIS — R2689 Other abnormalities of gait and mobility: Secondary | ICD-10-CM | POA: Insufficient documentation

## 2015-06-18 DIAGNOSIS — Z3202 Encounter for pregnancy test, result negative: Secondary | ICD-10-CM | POA: Insufficient documentation

## 2015-06-18 DIAGNOSIS — M1990 Unspecified osteoarthritis, unspecified site: Secondary | ICD-10-CM | POA: Diagnosis not present

## 2015-06-18 DIAGNOSIS — M5442 Lumbago with sciatica, left side: Secondary | ICD-10-CM | POA: Diagnosis not present

## 2015-06-18 DIAGNOSIS — Z791 Long term (current) use of non-steroidal anti-inflammatories (NSAID): Secondary | ICD-10-CM | POA: Diagnosis not present

## 2015-06-18 DIAGNOSIS — F410 Panic disorder [episodic paroxysmal anxiety] without agoraphobia: Secondary | ICD-10-CM | POA: Diagnosis not present

## 2015-06-18 DIAGNOSIS — F17210 Nicotine dependence, cigarettes, uncomplicated: Secondary | ICD-10-CM | POA: Diagnosis not present

## 2015-06-18 DIAGNOSIS — Z91040 Latex allergy status: Secondary | ICD-10-CM | POA: Insufficient documentation

## 2015-06-18 DIAGNOSIS — E669 Obesity, unspecified: Secondary | ICD-10-CM | POA: Insufficient documentation

## 2015-06-18 DIAGNOSIS — Z79899 Other long term (current) drug therapy: Secondary | ICD-10-CM | POA: Diagnosis not present

## 2015-06-18 DIAGNOSIS — G8929 Other chronic pain: Secondary | ICD-10-CM | POA: Diagnosis not present

## 2015-06-18 DIAGNOSIS — K219 Gastro-esophageal reflux disease without esophagitis: Secondary | ICD-10-CM | POA: Insufficient documentation

## 2015-06-18 DIAGNOSIS — M545 Low back pain: Secondary | ICD-10-CM | POA: Diagnosis present

## 2015-06-18 DIAGNOSIS — J45909 Unspecified asthma, uncomplicated: Secondary | ICD-10-CM | POA: Insufficient documentation

## 2015-06-18 LAB — URINALYSIS, ROUTINE W REFLEX MICROSCOPIC
BILIRUBIN URINE: NEGATIVE
Glucose, UA: NEGATIVE mg/dL
Hgb urine dipstick: NEGATIVE
KETONES UR: NEGATIVE mg/dL
Leukocytes, UA: NEGATIVE
NITRITE: NEGATIVE
PROTEIN: NEGATIVE mg/dL
SPECIFIC GRAVITY, URINE: 1.021 (ref 1.005–1.030)
pH: 6 (ref 5.0–8.0)

## 2015-06-18 LAB — POC URINE PREG, ED: Preg Test, Ur: NEGATIVE

## 2015-06-18 MED ORDER — DIAZEPAM 5 MG PO TABS: 5.0000 mg | ORAL_TABLET | Freq: Once | ORAL | Status: DC

## 2015-06-18 MED ORDER — ACETAMINOPHEN 500 MG PO TABS
1000.0000 mg | ORAL_TABLET | Freq: Once | ORAL | Status: AC
  Administered 2015-06-18: 1000 mg via ORAL
  Filled 2015-06-18: qty 2

## 2015-06-18 MED ORDER — OXYCODONE HCL 5 MG PO TABS
5.0000 mg | ORAL_TABLET | Freq: Once | ORAL | Status: AC
Start: 2015-06-18 — End: 2015-06-18
  Administered 2015-06-18: 5 mg via ORAL
  Filled 2015-06-18: qty 1

## 2015-06-18 MED ORDER — KETOROLAC TROMETHAMINE 60 MG/2ML IM SOLN
60.0000 mg | Freq: Once | INTRAMUSCULAR | Status: AC
  Administered 2015-06-18: 60 mg via INTRAMUSCULAR
  Filled 2015-06-18: qty 2

## 2015-06-18 MED ORDER — DIAZEPAM 5 MG PO TABS
5.0000 mg | ORAL_TABLET | Freq: Once | ORAL | Status: AC
  Administered 2015-06-18: 5 mg via ORAL
  Filled 2015-06-18: qty 1

## 2015-06-18 NOTE — ED Notes (Signed)
Per EMS-scuatica pain for 3 weeks-states incontinence of urine off and on-nausea for a couple of days-states she could be pregnant although pregnancy tests came back negative

## 2015-06-18 NOTE — ED Notes (Signed)
Patient transported to X-ray 

## 2015-06-18 NOTE — ED Notes (Signed)
Pt reported mid/lower back pain and recent falls d/t pain. Pt stated that the pain has been getting worse since December and she was seen at [facility] last week for the same problem and she has been having stress incontinence episodes. Pt MAEE. Gait steady. Pt ambulated to BR.

## 2015-06-18 NOTE — ED Notes (Signed)
Bed: [id number] Expected date:  Expected time:  Means of arrival:  Comments: EMS- 30 yo, lower back pain

## 2015-06-18 NOTE — ED Notes (Signed)
Awake. Verbally responsive. A/O x4. Resp even and unlabored. No audible adventitious breath sounds noted. ABC's intact.  

## 2015-06-18 NOTE — ED Provider Notes (Signed)
CSN: [id number]     Arrival date & time 06/18/15  [id number] History   First MD Initiated Contact with Patient 06/18/15 [phone]     Chief Complaint  Patient presents with  . Back Pain     (Consider location/radiation/quality/duration/timing/severity/associated sxs/prior Treatment) Patient is a 30 y.o. female presenting with back pain. The history is provided by the patient.  Back Pain Location:  Lumbar spine Quality:  Stabbing and shooting Radiates to:  L foot Pain severity:  Severe Onset quality:  Gradual Duration:  6 months Timing:  Constant Progression:  Worsening Chronicity:  Chronic Context comment:  Coughing Relieved by:  Nothing Worsened by:  Movement, palpation, sitting, touching and twisting Ineffective treatments:  None tried Associated symptoms: no chest pain, no dysuria, no fever and no headaches    30 yo F With a chief complaint of low back pain. Patient has a chronic history of low back pain and had a surgery performed back in November. Patient states that she was pain-free for about a month and then coughed and since then has had terrible left-sided lower back pain that radiates down her leg. His pain is been pretty persistent. She has been unable to see her neurosurgeon since then. Had multiple visits to the ED for the same. She is complaining of some numbness to the left lateral aspect of her leg. Also saying that she's having some weakness with plantar flexion. She states this is been coming and going for quite some time even prior to the surgery. Denies any fevers. Denies any loss of bowel. Has had some incontinence of urine when she coughs or sneezes. Denies loss of perirectal sensation.  Past Medical History  Diagnosis Date  . Scoliosis   . Obesity   . Migraine   . Gallstones   . Asthma   . Arthritis   . Pelvic inflammatory disease (PID) 05-08-11    previous hx. .Hx. childbirth x3-NVD  . Leukocytosis     going to hematologist  . History of renal failure   . Edema    . History of anemia   . Cancer ([place])   . Cervical cancer ([place])   . Anxiety     Panic attack  . DDD (degenerative disc disease), cervical   . PTSD (post-traumatic stress disorder)   . GERD (gastroesophageal reflux disease)   . Constipation   . PID (pelvic inflammatory disease)   . HPV (human papilloma virus) anogenital infection    Past Surgical History  Procedure Laterality Date  . Abdominal exploration surgery  approx 30 years old    rlq(due to adhesions around ovaries)-appendix and ovaries remain  . Cholecystectomy  05/10/2011    Procedure: LAPAROSCOPIC CHOLECYSTECTOMY WITH INTRAOPERATIVE CHOLANGIOGRAM;  Surgeon: Gayland Curry, MD,FACS;  Location: [facility];  Service: General;  Laterality: N/A;  . Anterior cervical decomp/discectomy fusion N/A 12/09/2012    Procedure: ANTERIOR CERVICAL DECOMPRESSION/DISCECTOMY FUSION STRUCTURAL ALLOGRAFT TRESTLE PLATE CERVICAL FIVE-SIX,SIX-SEVEN;  Surgeon: Otilio Connors, MD;  Location: [facility];  Service: Neurosurgery;  Laterality: N/A;  . Lumbar laminectomy/decompression microdiscectomy Left 11/30/2014    Procedure: Left lumbar four-five Microdiskectomy;  Surgeon: Kristeen Miss, MD;  Location: [facility];  Service: Neurosurgery;  Laterality: Left;   Family History  Problem Relation Age of Onset  . Prostate cancer Maternal Grandfather   . Cancer Maternal Grandfather     prostate  . Colon polyps Maternal Grandmother   . Diabetes Mother   . Endometriosis Mother   . Cancer Mother  lung  . Diabetes Father   . Heart disease Father   . Cirrhosis Paternal Grandfather    Social History  Substance Use Topics  . Smoking status: Current Every Day Smoker -- 0.50 packs/day for 12 years    Types: Cigarettes  . Smokeless tobacco: Never Used     Comment: trying to quit  . Alcohol Use: No     Comment: wine cooler for occasion   OB History    No data available     Review of Systems  Constitutional: Negative for fever and chills.  HENT:  Negative for congestion and rhinorrhea.   Eyes: Negative for redness and visual disturbance.  Respiratory: Negative for shortness of breath and wheezing.   Cardiovascular: Negative for chest pain and palpitations.  Gastrointestinal: Negative for nausea and vomiting.  Genitourinary: Negative for dysuria and urgency.  Musculoskeletal: Positive for back pain and gait problem. Negative for myalgias and arthralgias.  Skin: Negative for pallor and wound.  Neurological: Negative for dizziness and headaches.      Allergies  Banana; Orange oil; Latex; and Miconazole  Home Medications   Prior to Admission medications   Medication Sig Start Date End Date Taking? Authorizing Provider  ALPRAZolam Duanne Moron) 0.5 MG tablet Take 0.5 mg by mouth 2 (two) times daily.     Historical Provider, MD  ARIPiprazole (ABILIFY) 5 MG tablet Take 5 mg by mouth daily.    Historical Provider, MD  busPIRone (BUSPAR) 15 MG tablet Take 15 mg by mouth 2 (two) times daily.    Historical Provider, MD  cetirizine (ZYRTEC) 5 MG tablet Take 1 tablet (5 mg total) by mouth daily. 06/03/15   Veatrice Bourbon, MD  diclofenac (VOLTAREN) 75 MG EC tablet Take 75 mg by mouth 2 (two) times daily.    Historical Provider, MD  esomeprazole (NEXIUM) 40 MG capsule Take 40 mg by mouth daily.     Historical Provider, MD  FLUoxetine (PROZAC) 20 MG capsule Take 80 mg by mouth daily.     Historical Provider, MD  ondansetron (ZOFRAN) 4 MG tablet Take 1 tablet (4 mg total) by mouth every 8 (eight) hours as needed for nausea or vomiting. 06/03/15   Veatrice Bourbon, MD  oxyCODONE-acetaminophen (PERCOCET/ROXICET) 5-325 MG tablet Take 1-2 tablets by mouth every 4 (four) hours as needed for moderate pain. 06/10/15   Margarita Mail, PA-C  prazosin (MINIPRESS) 2 MG capsule Take 2 mg by mouth at bedtime.     Historical Provider, MD  traZODone (DESYREL) 50 MG tablet Take 100 mg by mouth at bedtime.     Historical Provider, MD  zolpidem (AMBIEN) 10 MG tablet Take  10 mg by mouth at bedtime as needed for sleep.    Historical Provider, MD   BP 90/68 mmHg  Pulse 64  Temp(Src) 97.5 F (36.4 C) (Oral)  Resp 16  SpO2 96%  LMP 05/10/2015 Physical Exam  Constitutional: She is oriented to person, place, and time. She appears well-developed and well-nourished. No distress.  HENT:  Head: Normocephalic and atraumatic.  Eyes: EOM are normal. Pupils are equal, round, and reactive to light.  Neck: Normal range of motion. Neck supple.  Cardiovascular: Normal rate and regular rhythm.  Exam reveals no gallop and no friction rub.   No murmur heard. Pulmonary/Chest: Effort normal. She has no wheezes. She has no rales.  Abdominal: Soft. She exhibits no distension. There is no tenderness. There is no rebound and no guarding.  Musculoskeletal: She exhibits no edema or tenderness.  Neurological: She is alert and oriented to person, place, and time. She displays no Babinski's sign on the left side.  Reflex Scores:      Patellar reflexes are 2+ on the right side and 2+ on the left side.      Achilles reflexes are 2+ on the right side and 2+ on the left side. No noted clonus. Patient has subjective loss of sensation to the lateral aspect of her foot as well as her leg. Also having some weakness with plantar flexion.  Skin: Skin is warm and dry. She is not diaphoretic.  Psychiatric: She has a normal mood and affect. Her behavior is normal.  Nursing note and vitals reviewed.   ED Course  Procedures (including critical care time) Labs Review Labs Reviewed  URINALYSIS, ROUTINE W REFLEX MICROSCOPIC (NOT AT [facility])  POC URINE PREG, ED    Imaging Review Dg Lumbar Spine Complete  06/18/2015  CLINICAL DATA:  30 year old female with severe acute on chronic lumbar spine pain. Recent fall down several steps. EXAM: LUMBAR SPINE - COMPLETE 4+ VIEW COMPARISON:  Prior MRI lumbar spine 10/08/2014; prior lumbar spine radiographs 03/27/2014 FINDINGS: No evidence of acute fracture.  Vertebral body heights are maintained. Stable appearance of a mild levoconvex and rotary scoliosis with the apex at L2-L3. Degenerative disc disease at L4-L5 and L5-S1 without interval progression compared to relatively recent prior imaging. There is loss of disc space height and early endplate sclerosis. Surgical clips in the right upper quadrant suggest prior cholecystectomy. IMPRESSION: 1. No acute abnormality. 2. Stable L4-L5 and L5-S1 degenerative disc disease without bony progression compared to relatively recent prior imaging. 3. Stable mild levoconvex and slightly rotary scoliosis of the lumbar spine. Electronically Signed   By: Jacqulynn Cadet M.D.   On: 06/18/2015 10:23   I have personally reviewed and evaluated these images and lab results as part of my medical decision-making.   EKG Interpretation None      MDM   Final diagnoses:  Left-sided low back pain with left-sided sciatica    30 yo F with a chief complaint of low back pain with sciatica. Is a chronic issue for this patient. She has weakness of plantar flexion on my exam however when asked patient states that this is a problem that comes and goes with her back pain. Also having some numbness which the patient says also occurs frequently. Patient fell about 2 weeks ago and has not had any imaging. Will obtain a plain film of low back.  X-rays negative for acute fracture. Upon discharge patient is requesting narcotics. Discussed the department policy for chronic conditions. Patient is confused because she was recently here and got a prescription for narcotics. Discussed the dangers of opiate use Will have her follow-up with her family doctor as well as her spinal Psychologist, sport and exercise.  3:03 PM:  I have discussed the diagnosis/risks/treatment options with the patient and believe the pt to be eligible for discharge home to follow-up with Neurosurgery, pcp. We also discussed returning to the ED immediately if new or worsening sx occur. We  discussed the sx which are most concerning (e.g., sudden worsening pain, fever, cauda equina) that necessitate immediate return. Medications administered to the patient during their visit and any new prescriptions provided to the patient are listed below.  Medications given during this visit Medications  acetaminophen (TYLENOL) tablet 1,000 mg (1,000 mg Oral Given 06/18/15 0924)  ketorolac (TORADOL) injection 60 mg (60 mg Intramuscular Given 06/18/15 1008)  oxyCODONE (Oxy IR/ROXICODONE) immediate  release tablet 5 mg (5 mg Oral Given 06/18/15 1008)  diazepam (VALIUM) tablet 5 mg (5 mg Oral Given 06/18/15 1008)    Discharge Medication List as of 06/18/2015 10:32 AM      The patient appears reasonably screen and/or stabilized for discharge and I doubt any other medical condition or other [facility] requiring further screening, evaluation, or treatment in the ED at this time prior to discharge.    Deno Etienne, DO 06/18/15 [phone]

## 2015-06-18 NOTE — Discharge Instructions (Signed)
Tylenol 1-2 tabs po q4h prn   Chronic Back Pain  When back pain lasts longer than 3 months, it is called chronic back pain.People with chronic back pain often go through certain periods that are more intense (flare-ups).  CAUSES Chronic back pain can be caused by wear and tear (degeneration) on different structures in your back. These structures include:  The bones of your spine (vertebrae) and the joints surrounding your spinal cord and nerve roots (facets).  The strong, fibrous tissues that connect your vertebrae (ligaments). Degeneration of these structures may result in pressure on your nerves. This can lead to constant pain. HOME CARE INSTRUCTIONS  Avoid bending, heavy lifting, prolonged sitting, and activities which make the problem worse.  Take brief periods of rest throughout the day to reduce your pain. Lying down or standing usually is better than sitting while you are resting.  Take over-the-counter or prescription medicines only as directed by your caregiver. SEEK IMMEDIATE MEDICAL CARE IF:   You have weakness or numbness in one of your legs or feet.  You have trouble controlling your bladder or bowels.  You have nausea, vomiting, abdominal pain, shortness of breath, or fainting.   This information is not intended to replace advice given to you by your health care provider. Make sure you discuss any questions you have with your health care provider.   Document Released: 04/19/2004 Document Revised: 06/04/2011 Document Reviewed: 08/30/2014 Elsevier Interactive Patient Education [employer].

## 2015-06-20 ENCOUNTER — Encounter (HOSPITAL_COMMUNITY): Payer: Self-pay | Admitting: Emergency Medicine

## 2015-06-20 DIAGNOSIS — F4310 Post-traumatic stress disorder, unspecified: Secondary | ICD-10-CM | POA: Diagnosis not present

## 2015-06-20 DIAGNOSIS — M1990 Unspecified osteoarthritis, unspecified site: Secondary | ICD-10-CM | POA: Diagnosis not present

## 2015-06-20 DIAGNOSIS — E669 Obesity, unspecified: Secondary | ICD-10-CM | POA: Diagnosis not present

## 2015-06-20 DIAGNOSIS — Z862 Personal history of diseases of the blood and blood-forming organs and certain disorders involving the immune mechanism: Secondary | ICD-10-CM | POA: Insufficient documentation

## 2015-06-20 DIAGNOSIS — K219 Gastro-esophageal reflux disease without esophagitis: Secondary | ICD-10-CM | POA: Diagnosis not present

## 2015-06-20 DIAGNOSIS — Z91040 Latex allergy status: Secondary | ICD-10-CM | POA: Diagnosis not present

## 2015-06-20 DIAGNOSIS — Z8619 Personal history of other infectious and parasitic diseases: Secondary | ICD-10-CM | POA: Insufficient documentation

## 2015-06-20 DIAGNOSIS — M419 Scoliosis, unspecified: Secondary | ICD-10-CM | POA: Insufficient documentation

## 2015-06-20 DIAGNOSIS — F319 Bipolar disorder, unspecified: Secondary | ICD-10-CM | POA: Diagnosis not present

## 2015-06-20 DIAGNOSIS — M5432 Sciatica, left side: Secondary | ICD-10-CM | POA: Diagnosis not present

## 2015-06-20 DIAGNOSIS — F410 Panic disorder [episodic paroxysmal anxiety] without agoraphobia: Secondary | ICD-10-CM | POA: Diagnosis not present

## 2015-06-20 DIAGNOSIS — Z791 Long term (current) use of non-steroidal anti-inflammatories (NSAID): Secondary | ICD-10-CM | POA: Diagnosis not present

## 2015-06-20 DIAGNOSIS — F17210 Nicotine dependence, cigarettes, uncomplicated: Secondary | ICD-10-CM | POA: Insufficient documentation

## 2015-06-20 DIAGNOSIS — Z8541 Personal history of malignant neoplasm of cervix uteri: Secondary | ICD-10-CM | POA: Diagnosis not present

## 2015-06-20 DIAGNOSIS — Z8742 Personal history of other diseases of the female genital tract: Secondary | ICD-10-CM | POA: Insufficient documentation

## 2015-06-20 DIAGNOSIS — J45909 Unspecified asthma, uncomplicated: Secondary | ICD-10-CM | POA: Diagnosis not present

## 2015-06-20 DIAGNOSIS — M545 Low back pain: Secondary | ICD-10-CM | POA: Diagnosis present

## 2015-06-20 DIAGNOSIS — Z79899 Other long term (current) drug therapy: Secondary | ICD-10-CM | POA: Insufficient documentation

## 2015-06-20 MED ORDER — OXYCODONE-ACETAMINOPHEN 5-325 MG PO TABS
ORAL_TABLET | ORAL | Status: AC
  Filled 2015-06-20: qty 1

## 2015-06-20 MED ORDER — OXYCODONE-ACETAMINOPHEN 5-325 MG PO TABS
1.0000 | ORAL_TABLET | Freq: Once | ORAL | Status: AC
  Administered 2015-06-20: 1 via ORAL

## 2015-06-20 NOTE — ED Notes (Signed)
Pt. reports " sciatica " pain at left and low back pain onset 4 days ago , denies injury or fall , no dysuria or hematuria .

## 2015-06-21 ENCOUNTER — Emergency Department (HOSPITAL_COMMUNITY)
Admission: EM | Admit: 2015-06-21 | Discharge: 2015-06-21 | Disposition: A | Discharge status: Home / Self Care | Payer: Medicaid Other | Attending: Emergency Medicine | Admitting: Emergency Medicine

## 2015-06-21 DIAGNOSIS — M5432 Sciatica, left side: Secondary | ICD-10-CM

## 2015-06-21 HISTORY — DX: Bipolar disorder, unspecified: F31.9

## 2015-06-21 HISTORY — DX: Sciatica, unspecified side: M54.30

## 2015-06-21 MED ORDER — METHOCARBAMOL 500 MG PO TABS: 500.0000 mg | ORAL_TABLET | Freq: Three times a day (TID) | ORAL | Status: DC | PRN

## 2015-06-21 MED ORDER — HYDROMORPHONE HCL 1 MG/ML IJ SOLN
2.0000 mg | Freq: Once | INTRAMUSCULAR | Status: AC
  Administered 2015-06-21: 2 mg via INTRAMUSCULAR
  Filled 2015-06-21: qty 2

## 2015-06-21 MED ORDER — ONDANSETRON 4 MG PO TBDP
8.0000 mg | ORAL_TABLET | Freq: Once | ORAL | Status: AC
Start: 2015-06-21 — End: 2015-06-21
  Administered 2015-06-21: 8 mg via ORAL
  Filled 2015-06-21: qty 2

## 2015-06-21 MED ORDER — PREDNISONE 20 MG PO TABS: ORAL_TABLET | ORAL | Status: DC

## 2015-06-21 MED ORDER — DEXAMETHASONE SODIUM PHOSPHATE 10 MG/ML IJ SOLN
10.0000 mg | Freq: Once | INTRAMUSCULAR | Status: AC
  Administered 2015-06-21: 10 mg via INTRAMUSCULAR
  Filled 2015-06-21: qty 1

## 2015-06-21 NOTE — ED Provider Notes (Signed)
CSN: [id number]     Arrival date & time 06/20/15  1954 History  By signing my name below, I, Altamease Oiler, attest that this documentation has been prepared under the direction and in the presence of Orpah Greek, MD. Electronically Signed: Altamease Oiler, ED Scribe. 06/21/2015. 12:55 AM   Chief Complaint  Patient presents with  . Sciatica   The history is provided by the patient. No language interpreter was used.   [name] is a 30 y.o. female with history of sciatica, DDD, and obesity who presents to the Emergency Department complaining of recurrent, constant, 7/10 in severity, left lower back pain with onset 4 days ago. The pain radiates down the left leg and is similar to pain that she has had in the past with sciatica.  Pt denies incontinence of bowel or bladder, numbness, or weakness. She is scheduled to see her surgeon again next month.   Past Medical History  Diagnosis Date  . Scoliosis   . Obesity   . Migraine   . Gallstones   . Asthma   . Arthritis   . Pelvic inflammatory disease (PID) 05-08-11    previous hx. .Hx. childbirth x3-NVD  . Leukocytosis     going to hematologist  . History of renal failure   . Edema   . History of anemia   . Cancer ([place])   . Cervical cancer ([place])   . Anxiety     Panic attack  . DDD (degenerative disc disease), cervical   . PTSD (post-traumatic stress disorder)   . GERD (gastroesophageal reflux disease)   . Constipation   . PID (pelvic inflammatory disease)   . HPV (human papilloma virus) anogenital infection   . Sciatica   . Bipolar 1 disorder ([place])   . Obesity    Past Surgical History  Procedure Laterality Date  . Abdominal exploration surgery  approx 30 years old    rlq(due to adhesions around ovaries)-appendix and ovaries remain  . Cholecystectomy  05/10/2011    Procedure: LAPAROSCOPIC CHOLECYSTECTOMY WITH INTRAOPERATIVE CHOLANGIOGRAM;  Surgeon: Gayland Curry, MD,FACS;  Location: [facility];  Service: General;   Laterality: N/A;  . Anterior cervical decomp/discectomy fusion N/A 12/09/2012    Procedure: ANTERIOR CERVICAL DECOMPRESSION/DISCECTOMY FUSION STRUCTURAL ALLOGRAFT TRESTLE PLATE CERVICAL FIVE-SIX,SIX-SEVEN;  Surgeon: Otilio Connors, MD;  Location: [place] [facility];  Service: Neurosurgery;  Laterality: N/A;  . Lumbar laminectomy/decompression microdiscectomy Left 11/30/2014    Procedure: Left lumbar four-five Microdiskectomy;  Surgeon: Kristeen Miss, MD;  Location: [place] [facility];  Service: Neurosurgery;  Laterality: Left;   Family History  Problem Relation Age of Onset  . Prostate cancer Maternal Grandfather   . Cancer Maternal Grandfather     prostate  . Colon polyps Maternal Grandmother   . Diabetes Mother   . Endometriosis Mother   . Cancer Mother     lung  . Diabetes Father   . Heart disease Father   . Cirrhosis Paternal Grandfather    Social History  Substance Use Topics  . Smoking status: Current Every Day Smoker -- 0.00 packs/day for 12 years    Types: Cigarettes  . Smokeless tobacco: Never Used     Comment: trying to quit  . Alcohol Use: No   OB History    No data available     Review of Systems  Genitourinary: Negative for difficulty urinating.  Musculoskeletal: Positive for back pain.  Neurological: Negative for weakness and numbness.  All other systems reviewed and are negative.  Allergies  Banana; Orange oil; Latex; and Miconazole  Home Medications   Prior to Admission medications   Medication Sig Start Date End Date Taking? Authorizing Provider  ALPRAZolam Duanne Moron) 0.5 MG tablet Take 0.5 mg by mouth 2 (two) times daily.     Historical Provider, MD  ARIPiprazole (ABILIFY) 5 MG tablet Take 5 mg by mouth daily.    Historical Provider, MD  busPIRone (BUSPAR) 15 MG tablet Take 15 mg by mouth 2 (two) times daily.    Historical Provider, MD  cetirizine (ZYRTEC) 5 MG tablet Take 1 tablet (5 mg total) by mouth daily. 06/03/15   Veatrice Bourbon, MD  diclofenac (VOLTAREN) 75 MG  EC tablet Take 75 mg by mouth 2 (two) times daily.    Historical Provider, MD  esomeprazole (NEXIUM) 40 MG capsule Take 40 mg by mouth daily.     Historical Provider, MD  FLUoxetine (PROZAC) 20 MG capsule Take 80 mg by mouth daily.     Historical Provider, MD  methocarbamol (ROBAXIN) 500 MG tablet Take 1 tablet (500 mg total) by mouth every 8 (eight) hours as needed for muscle spasms. 06/21/15   Orpah Greek, MD  ondansetron (ZOFRAN) 4 MG tablet Take 1 tablet (4 mg total) by mouth every 8 (eight) hours as needed for nausea or vomiting. 06/03/15   Veatrice Bourbon, MD  oxyCODONE-acetaminophen (PERCOCET/ROXICET) 5-325 MG tablet Take 1-2 tablets by mouth every 4 (four) hours as needed for moderate pain. 06/10/15   Margarita Mail, PA-C  prazosin (MINIPRESS) 2 MG capsule Take 2 mg by mouth at bedtime.     Historical Provider, MD  predniSONE (DELTASONE) 20 MG tablet 3 tabs po daily x 3 days, then 2 tabs x 3 days, then 1.5 tabs x 3 days, then 1 tab x 3 days, then 0.5 tabs x 3 days 06/21/15   Orpah Greek, MD  traZODone (DESYREL) 50 MG tablet Take 100 mg by mouth at bedtime.     Historical Provider, MD  zolpidem (AMBIEN) 10 MG tablet Take 10 mg by mouth at bedtime as needed for sleep.    Historical Provider, MD   BP 110/83 mmHg  Pulse 94  Temp(Src) 98.8 F (37.1 C) (Oral)  Resp 20  SpO2 100%  LMP 06/20/2015 Physical Exam  Constitutional: She is oriented to person, place, and time. She appears well-developed and well-nourished. No distress.  HENT:  Head: Normocephalic and atraumatic.  Right Ear: Hearing normal.  Left Ear: Hearing normal.  Nose: Nose normal.  Mouth/Throat: Oropharynx is clear and moist and mucous membranes are normal.  Eyes: Conjunctivae and EOM are normal. Pupils are equal, round, and reactive to light.  Neck: Normal range of motion. Neck supple.  Cardiovascular: Regular rhythm, S1 normal and S2 normal.  Exam reveals no gallop and no friction rub.   No murmur  heard. Pulmonary/Chest: Effort normal and breath sounds normal. No respiratory distress. She exhibits no tenderness.  Abdominal: Soft. Normal appearance and bowel sounds are normal. There is no hepatosplenomegaly. There is no tenderness. There is no rebound, no guarding, no tenderness at McBurney's point and negative Murphy's sign. No hernia.  Musculoskeletal: Normal range of motion.  left lower lumbar paraspinal spasm  +SLR on left with normal strength and sensation  Neurological: She is alert and oriented to person, place, and time. She has normal strength. No cranial nerve deficit or sensory deficit. Coordination normal. GCS eye subscore is 4. GCS verbal subscore is 5. GCS motor subscore is 6.  Skin: Skin  is warm, dry and intact. No rash noted. No cyanosis.  Psychiatric: She has a normal mood and affect. Her speech is normal and behavior is normal. Thought content normal.  Nursing note and vitals reviewed.   ED Course  Procedures (including critical care time) DIAGNOSTIC STUDIES: Oxygen Saturation is 100% on RA,  normal by my interpretation.    COORDINATION OF CARE: 12:52 AM Discussed treatment plan which includes Dilaudid, Zofran, and Decadron with pt at bedside and pt agreed to plan.  Labs Review Labs Reviewed - No data to display  Imaging Review No results found.    EKG Interpretation None      MDM   Final diagnoses:  Sciatica of left side   Patient presents to the ER with musculoskeletal back pain. Examination reveals back tenderness without any associated neurologic findings. Patient's strength, sensation and reflexes were normal. As such, patient did not require any imaging or further studies. Patient was treated with analgesia.  I personally performed the services described in this documentation, which was scribed in my presence. The recorded information has been reviewed and is accurate.    Orpah Greek, MD 06/23/15 [phone]

## 2015-06-21 NOTE — ED Notes (Signed)
Instructed patient she could not leave until someone came for her due to getting pain med voiced understanding

## 2015-06-21 NOTE — ED Notes (Signed)
Patient presents with c/o left leg pain.  Is currently being treated for sciatica but nothing has been helping

## 2015-06-21 NOTE — ED Notes (Signed)
Discharge instructions and prescriptions reviewed - voiced understanding 

## 2015-06-21 NOTE — Discharge Instructions (Signed)

## 2015-08-01 ENCOUNTER — Other Ambulatory Visit: Payer: Self-pay | Admitting: Neurological Surgery

## 2015-08-09 NOTE — Pre-Procedure Instructions (Addendum)
Erinn Schorer  08/09/2015      [id number] [address] [place] [address] Phone: [phone] Fax: [phone]    Your procedure is scheduled on Mon, May 22 @ 2:10 PM   Report to [facility] Admitting at 11:15 AM   Call this number if you have problems the morning of surgery:  [phone]   Remember:  Do not eat food or drink liquids after midnight.  Take these medicines the morning of surgery with A SIP OF WATER Albuterol<Bring Your Inhaler With You>,Alprazolam(Xanax),Abilify(Aripiprazole),Buspar(Buspirone),Zyrtec(Cetirizine),Nexium(Esomeprazole),Fluoxetine(Prozac),Zofran(Ondansetron-if needed), and Pain Pill(if needed)             Sop taking your Diclofenac. No Goody's,BC's,Aleve,Aspirin,Ibuprofen,Advil,Motrin,Fish Oil,or any Herbal Medications.    Do not wear jewelry, make-up or nail polish.  Do not wear lotions, powders, or perfumes.    Do not shave 48 hours prior to surgery.    Do not bring valuables to the hospital.  [facility] is not responsible for any belongings or valuables.  Contacts, dentures or bridgework may not be worn into surgery.  Leave your suitcase in the car.  After surgery it may be brought to your room.  For patients admitted to the hospital, discharge time will be determined by your treatment team.  Patients discharged the day of surgery will not be allowed to drive home.    [facility]  Before surgery, you can play an important role.  Because skin is not sterile, your skin needs to be as free of germs as possible.  You can reduce the number of germs on you skin by washing with CHG (chlorahexidine gluconate) soap before surgery.  CHG is an antiseptic cleaner which kills germs and bonds with the skin to continue killing germs even after washing.  Please DO NOT use if you have an allergy to CHG or antibacterial soaps.  If your skin  becomes reddened/irritated stop using the CHG and inform your nurse when you arrive at Short Stay.  Do not shave (including legs and underarms) for at least 48 hours prior to the first CHG shower.  You may shave your face.  Please follow these instructions carefully:   1.  Shower with CHG Soap the night before surgery and the  morning of Surgery.  2.  If you choose to wash your hair, wash your hair first as usual with your normal shampoo.  3.  After you shampoo, rinse your hair and body thoroughly to remove the  Shampoo.  4.  Use CHG as you would any other liquid soap.  You can apply chg directly   to the skin and wash gently with scrungie or a clean washcloth.  5.  Apply the CHG Soap to your body ONLY FROM THE NECK DOWN.        Do not use on open wounds or open sores.  Avoid contact with your eyes,       ears, mouth and genitals (private parts).  Wash genitals (private parts)       with your normal soap.  6.  Wash thoroughly, paying special attention to the area where your surgery        will be performed.  7.  Thoroughly rinse your body with warm water from the neck down.  8.  DO NOT shower/wash with your normal soap after using and rinsing off       the CHG Soap.  9.  Pat yourself dry with a clean towel.            10.  Wear clean pajamas.            11.  Place clean sheets on your bed the night of your first shower and do not        sleep with pets.  Day of Surgery  Do not apply any lotions/deoderants the morning of surgery.  Please wear clean clothes to the hospital/surgery center.

## 2015-08-10 ENCOUNTER — Encounter (HOSPITAL_COMMUNITY): Payer: Self-pay

## 2015-08-10 ENCOUNTER — Encounter (HOSPITAL_COMMUNITY)
Admission: RE | Admit: 2015-08-10 | Discharge: 2015-08-10 | Disposition: A | Discharge status: Home / Self Care | Payer: Medicaid Other | Source: Ambulatory Visit | Attending: Neurological Surgery | Admitting: Neurological Surgery

## 2015-08-10 DIAGNOSIS — Z01812 Encounter for preprocedural laboratory examination: Secondary | ICD-10-CM | POA: Diagnosis not present

## 2015-08-10 DIAGNOSIS — M5410 Radiculopathy, site unspecified: Secondary | ICD-10-CM | POA: Diagnosis not present

## 2015-08-10 DIAGNOSIS — M549 Dorsalgia, unspecified: Secondary | ICD-10-CM | POA: Insufficient documentation

## 2015-08-10 HISTORY — DX: Anemia, unspecified: D64.9

## 2015-08-10 HISTORY — DX: Chronic kidney disease, unspecified: N18.9

## 2015-08-10 LAB — BASIC METABOLIC PANEL
ANION GAP: 14 (ref 5–15)
BUN: 13 mg/dL (ref 6–20)
CHLORIDE: 105 mmol/L (ref 101–111)
CO2: 21 mmol/L — AB (ref 22–32)
Calcium: 9.2 mg/dL (ref 8.9–10.3)
Creatinine, Ser: 0.95 mg/dL (ref 0.44–1.00)
GFR calc non Af Amer: >60 mL/min (ref >60)
Glucose, Bld: 94 mg/dL (ref 65–99)
POTASSIUM: 3.5 mmol/L (ref 3.5–5.1)
Sodium: 140 mmol/L (ref 135–145)

## 2015-08-10 LAB — SURGICAL PCR SCREEN
MRSA, PCR: NEGATIVE
STAPHYLOCOCCUS AUREUS: NEGATIVE

## 2015-08-10 LAB — CBC
HCT: 36.7 % (ref 36.0–46.0)
HEMOGLOBIN: 12 g/dL (ref 12.0–15.0)
MCH: 25.8 pg — AB (ref 26.0–34.0)
MCHC: 32.7 g/dL (ref 30.0–36.0)
MCV: 78.9 fL (ref 78.0–100.0)
Platelets: 265 10*3/uL (ref 150–400)
RBC: 4.65 MIL/uL (ref 3.87–5.11)
RDW: 17.9 % — ABNORMAL HIGH (ref 11.5–15.5)
WBC: 9.7 10*3/uL (ref 4.0–10.5)

## 2015-08-10 LAB — HCG, SERUM, QUALITATIVE: Preg, Serum: NEGATIVE

## 2015-08-14 MED ORDER — CEFAZOLIN SODIUM-DEXTROSE 2-4 GM/100ML-% IV SOLN
2.0000 g | INTRAVENOUS | Status: AC
  Administered 2015-08-15: 2 g via INTRAVENOUS
  Filled 2015-08-14: qty 100

## 2015-08-15 ENCOUNTER — Ambulatory Visit (HOSPITAL_COMMUNITY): Payer: Medicaid Other

## 2015-08-15 ENCOUNTER — Ambulatory Visit (HOSPITAL_COMMUNITY): Payer: Medicaid Other | Admitting: Certified Registered Nurse Anesthetist

## 2015-08-15 ENCOUNTER — Encounter (HOSPITAL_COMMUNITY)
Admission: RE | Disposition: A | Discharge status: Home / Self Care | Payer: Self-pay | Source: Ambulatory Visit | Attending: Neurological Surgery

## 2015-08-15 ENCOUNTER — Observation Stay (HOSPITAL_COMMUNITY)
Admission: RE | Admit: 2015-08-15 | Discharge: 2015-08-16 | Disposition: A | Discharge status: Home / Self Care | Payer: Medicaid Other | Source: Ambulatory Visit | Attending: Neurological Surgery | Admitting: Neurological Surgery

## 2015-08-15 ENCOUNTER — Encounter (HOSPITAL_COMMUNITY): Payer: Self-pay | Admitting: Certified Registered Nurse Anesthetist

## 2015-08-15 DIAGNOSIS — Z8541 Personal history of malignant neoplasm of cervix uteri: Secondary | ICD-10-CM | POA: Diagnosis not present

## 2015-08-15 DIAGNOSIS — M5126 Other intervertebral disc displacement, lumbar region: Secondary | ICD-10-CM | POA: Diagnosis present

## 2015-08-15 DIAGNOSIS — F1210 Cannabis abuse, uncomplicated: Secondary | ICD-10-CM | POA: Diagnosis not present

## 2015-08-15 DIAGNOSIS — Z6841 Body Mass Index (BMI) 40.0 and over, adult: Secondary | ICD-10-CM | POA: Insufficient documentation

## 2015-08-15 DIAGNOSIS — N189 Chronic kidney disease, unspecified: Secondary | ICD-10-CM | POA: Insufficient documentation

## 2015-08-15 DIAGNOSIS — M5116 Intervertebral disc disorders with radiculopathy, lumbar region: Secondary | ICD-10-CM | POA: Diagnosis not present

## 2015-08-15 DIAGNOSIS — Z419 Encounter for procedure for purposes other than remedying health state, unspecified: Secondary | ICD-10-CM

## 2015-08-15 DIAGNOSIS — F17210 Nicotine dependence, cigarettes, uncomplicated: Secondary | ICD-10-CM | POA: Diagnosis not present

## 2015-08-15 HISTORY — PX: LUMBAR LAMINECTOMY/DECOMPRESSION MICRODISCECTOMY: SHX5026

## 2015-08-15 SURGERY — LUMBAR LAMINECTOMY/DECOMPRESSION MICRODISCECTOMY 1 LEVEL
Anesthesia: General | Site: Back | Laterality: Left

## 2015-08-15 MED ORDER — POLYETHYLENE GLYCOL 3350 17 G PO PACK: 17.0000 g | PACK | Freq: Every day | ORAL | Status: DC | PRN

## 2015-08-15 MED ORDER — PROPOFOL 10 MG/ML IV BOLUS
INTRAVENOUS | Status: AC
  Filled 2015-08-15: qty 20

## 2015-08-15 MED ORDER — DEXTROSE 5 % IV SOLN
500.0000 mg | Freq: Four times a day (QID) | INTRAVENOUS | Status: DC | PRN
  Filled 2015-08-15: qty 5

## 2015-08-15 MED ORDER — SODIUM CHLORIDE 0.9% FLUSH: 3.0000 mL | INTRAVENOUS | Status: DC | PRN

## 2015-08-15 MED ORDER — OXYCODONE-ACETAMINOPHEN 5-325 MG PO TABS
1.0000 | ORAL_TABLET | ORAL | Status: DC | PRN
  Administered 2015-08-15 – 2015-08-16 (×4): 2 via ORAL
  Filled 2015-08-15 (×3): qty 2

## 2015-08-15 MED ORDER — FENTANYL CITRATE (PF) 100 MCG/2ML IJ SOLN
25.0000 ug | INTRAMUSCULAR | Status: DC | PRN
  Administered 2015-08-15 (×3): 50 ug via INTRAVENOUS

## 2015-08-15 MED ORDER — TRAZODONE HCL 100 MG PO TABS
100.0000 mg | ORAL_TABLET | Freq: Every day | ORAL | Status: DC
  Administered 2015-08-15: 100 mg via ORAL
  Filled 2015-08-15: qty 1

## 2015-08-15 MED ORDER — DOCUSATE SODIUM 100 MG PO CAPS
100.0000 mg | ORAL_CAPSULE | Freq: Two times a day (BID) | ORAL | Status: DC
  Administered 2015-08-15 – 2015-08-16 (×2): 100 mg via ORAL
  Filled 2015-08-15 (×2): qty 1

## 2015-08-15 MED ORDER — SUGAMMADEX SODIUM 200 MG/2ML IV SOLN
INTRAVENOUS | Status: DC | PRN
  Administered 2015-08-15: 300 mg via INTRAVENOUS

## 2015-08-15 MED ORDER — ALPRAZOLAM 0.5 MG PO TABS
1.0000 mg | ORAL_TABLET | Freq: Two times a day (BID) | ORAL | Status: DC | PRN
  Administered 2015-08-15 – 2015-08-16 (×2): 1 mg via ORAL
  Filled 2015-08-15 (×2): qty 2

## 2015-08-15 MED ORDER — FLUOXETINE HCL 20 MG PO CAPS
80.0000 mg | ORAL_CAPSULE | Freq: Every day | ORAL | Status: DC
  Administered 2015-08-16: 80 mg via ORAL
  Filled 2015-08-15 (×2): qty 4

## 2015-08-15 MED ORDER — MIDAZOLAM HCL 5 MG/5ML IJ SOLN
INTRAMUSCULAR | Status: DC | PRN
  Administered 2015-08-15: 2 mg via INTRAVENOUS

## 2015-08-15 MED ORDER — ACETAMINOPHEN 10 MG/ML IV SOLN: 1000.0000 mg | Freq: Once | INTRAVENOUS | Status: DC

## 2015-08-15 MED ORDER — CEFAZOLIN SODIUM 1-5 GM-% IV SOLN
1.0000 g | Freq: Three times a day (TID) | INTRAVENOUS | Status: DC
  Filled 2015-08-15: qty 50

## 2015-08-15 MED ORDER — BACITRACIN 50000 UNITS IM SOLR
INTRAMUSCULAR | Status: DC | PRN
  Administered 2015-08-15: 17:00:00

## 2015-08-15 MED ORDER — LIDOCAINE HCL (CARDIAC) 20 MG/ML IV SOLN
INTRAVENOUS | Status: DC | PRN
  Administered 2015-08-15: 100 mg via INTRAVENOUS

## 2015-08-15 MED ORDER — ROCURONIUM BROMIDE 50 MG/5ML IV SOLN
INTRAVENOUS | Status: AC
  Filled 2015-08-15: qty 1

## 2015-08-15 MED ORDER — ROCURONIUM BROMIDE 100 MG/10ML IV SOLN
INTRAVENOUS | Status: DC | PRN
  Administered 2015-08-15: 50 mg via INTRAVENOUS

## 2015-08-15 MED ORDER — LIDOCAINE-EPINEPHRINE 1 %-1:100000 IJ SOLN
INTRAMUSCULAR | Status: DC | PRN
  Administered 2015-08-15: 4.5 mL

## 2015-08-15 MED ORDER — OXYCODONE-ACETAMINOPHEN 5-325 MG PO TABS
ORAL_TABLET | ORAL | Status: AC
  Administered 2015-08-15: 2 via ORAL
  Filled 2015-08-15: qty 2

## 2015-08-15 MED ORDER — DEXAMETHASONE SODIUM PHOSPHATE 10 MG/ML IJ SOLN
INTRAMUSCULAR | Status: DC | PRN
  Administered 2015-08-15: 5 mg via INTRAVENOUS

## 2015-08-15 MED ORDER — ONDANSETRON HCL 4 MG/2ML IJ SOLN
INTRAMUSCULAR | Status: AC
  Filled 2015-08-15: qty 2

## 2015-08-15 MED ORDER — ACETAMINOPHEN 325 MG PO TABS: 650.0000 mg | ORAL_TABLET | ORAL | Status: DC | PRN

## 2015-08-15 MED ORDER — MENTHOL 3 MG MT LOZG: 1.0000 | LOZENGE | OROMUCOSAL | Status: DC | PRN

## 2015-08-15 MED ORDER — FENTANYL CITRATE (PF) 100 MCG/2ML IJ SOLN
INTRAMUSCULAR | Status: AC
  Administered 2015-08-15: 50 ug via INTRAVENOUS
  Filled 2015-08-15: qty 2

## 2015-08-15 MED ORDER — EPHEDRINE SULFATE 50 MG/ML IJ SOLN
INTRAMUSCULAR | Status: DC | PRN
  Administered 2015-08-15 (×4): 10 mg via INTRAVENOUS
  Administered 2015-08-15 (×2): 5 mg via INTRAVENOUS

## 2015-08-15 MED ORDER — ONDANSETRON HCL 4 MG PO TABS: 4.0000 mg | ORAL_TABLET | Freq: Three times a day (TID) | ORAL | Status: DC | PRN

## 2015-08-15 MED ORDER — FENTANYL CITRATE (PF) 100 MCG/2ML IJ SOLN
50.0000 ug | Freq: Once | INTRAMUSCULAR | Status: AC
  Administered 2015-08-15: 50 ug via INTRAVENOUS

## 2015-08-15 MED ORDER — THROMBIN 5000 UNITS EX SOLR
CUTANEOUS | Status: DC | PRN
  Administered 2015-08-15 (×2): 5000 [IU] via TOPICAL

## 2015-08-15 MED ORDER — ARIPIPRAZOLE 5 MG PO TABS
5.0000 mg | ORAL_TABLET | Freq: Every day | ORAL | Status: DC
  Administered 2015-08-16: 5 mg via ORAL
  Filled 2015-08-15: qty 1

## 2015-08-15 MED ORDER — LACTATED RINGERS IV SOLN
INTRAVENOUS | Status: DC
  Administered 2015-08-15: 17:00:00 via INTRAVENOUS
  Administered 2015-08-15: 50 mL/h via INTRAVENOUS

## 2015-08-15 MED ORDER — SENNA 8.6 MG PO TABS
1.0000 | ORAL_TABLET | Freq: Two times a day (BID) | ORAL | Status: DC
  Administered 2015-08-15 – 2015-08-16 (×2): 8.6 mg via ORAL
  Filled 2015-08-15 (×2): qty 1

## 2015-08-15 MED ORDER — ONDANSETRON HCL 4 MG/2ML IJ SOLN: 4.0000 mg | INTRAMUSCULAR | Status: DC | PRN

## 2015-08-15 MED ORDER — KETOROLAC TROMETHAMINE 30 MG/ML IJ SOLN
INTRAMUSCULAR | Status: AC
  Administered 2015-08-15: 30 mg via INTRAVENOUS
  Filled 2015-08-15: qty 1

## 2015-08-15 MED ORDER — ACETAMINOPHEN 650 MG RE SUPP: 650.0000 mg | RECTAL | Status: DC | PRN

## 2015-08-15 MED ORDER — METHOCARBAMOL 500 MG PO TABS: 500.0000 mg | ORAL_TABLET | Freq: Three times a day (TID) | ORAL | Status: DC | PRN

## 2015-08-15 MED ORDER — ALBUTEROL SULFATE (2.5 MG/3ML) 0.083% IN NEBU: 3.0000 mL | INHALATION_SOLUTION | Freq: Four times a day (QID) | RESPIRATORY_TRACT | Status: DC | PRN

## 2015-08-15 MED ORDER — MIDAZOLAM HCL 2 MG/2ML IJ SOLN
INTRAMUSCULAR | Status: AC
  Filled 2015-08-15: qty 2

## 2015-08-15 MED ORDER — FENTANYL CITRATE (PF) 250 MCG/5ML IJ SOLN
INTRAMUSCULAR | Status: AC
  Filled 2015-08-15: qty 5

## 2015-08-15 MED ORDER — METHOCARBAMOL 500 MG PO TABS
500.0000 mg | ORAL_TABLET | Freq: Four times a day (QID) | ORAL | Status: DC | PRN
  Administered 2015-08-15 – 2015-08-16 (×2): 500 mg via ORAL
  Filled 2015-08-15 (×2): qty 1

## 2015-08-15 MED ORDER — PHENOL 1.4 % MT LIQD: 1.0000 | OROMUCOSAL | Status: DC | PRN

## 2015-08-15 MED ORDER — PHENYLEPHRINE HCL 10 MG/ML IJ SOLN
INTRAMUSCULAR | Status: DC | PRN
  Administered 2015-08-15: 80 ug via INTRAVENOUS

## 2015-08-15 MED ORDER — HYDROCODONE-ACETAMINOPHEN 5-325 MG PO TABS: 1.0000 | ORAL_TABLET | ORAL | Status: DC | PRN

## 2015-08-15 MED ORDER — OXYCODONE-ACETAMINOPHEN 5-325 MG PO TABS: 1.0000 | ORAL_TABLET | ORAL | Status: DC | PRN

## 2015-08-15 MED ORDER — 0.9 % SODIUM CHLORIDE (POUR BTL) OPTIME
TOPICAL | Status: DC | PRN
  Administered 2015-08-15: 1000 mL

## 2015-08-15 MED ORDER — PROPOFOL 10 MG/ML IV BOLUS
INTRAVENOUS | Status: AC
Start: 2015-08-15 — End: 2015-08-15
  Filled 2015-08-15: qty 20

## 2015-08-15 MED ORDER — ONDANSETRON HCL 4 MG/2ML IJ SOLN
INTRAMUSCULAR | Status: DC | PRN
Start: 2015-08-15 — End: 2015-08-15
  Administered 2015-08-15: 4 mg via INTRAVENOUS

## 2015-08-15 MED ORDER — LIDOCAINE 2% (20 MG/ML) 5 ML SYRINGE
INTRAMUSCULAR | Status: AC
  Filled 2015-08-15: qty 5

## 2015-08-15 MED ORDER — SODIUM CHLORIDE 0.9 % IV SOLN: 250.0000 mL | INTRAVENOUS | Status: DC

## 2015-08-15 MED ORDER — MAGNESIUM CITRATE PO SOLN: 1.0000 | Freq: Once | ORAL | Status: DC | PRN

## 2015-08-15 MED ORDER — ALUM & MAG HYDROXIDE-SIMETH 200-200-20 MG/5ML PO SUSP: 30.0000 mL | Freq: Four times a day (QID) | ORAL | Status: DC | PRN

## 2015-08-15 MED ORDER — PANTOPRAZOLE SODIUM 40 MG PO TBEC
40.0000 mg | DELAYED_RELEASE_TABLET | Freq: Every day | ORAL | Status: DC
  Administered 2015-08-15 – 2015-08-16 (×2): 40 mg via ORAL
  Filled 2015-08-15 (×2): qty 1

## 2015-08-15 MED ORDER — KETOROLAC TROMETHAMINE 30 MG/ML IJ SOLN
30.0000 mg | Freq: Once | INTRAMUSCULAR | Status: AC
  Administered 2015-08-15: 30 mg via INTRAVENOUS

## 2015-08-15 MED ORDER — BUPIVACAINE HCL (PF) 0.5 % IJ SOLN
INTRAMUSCULAR | Status: DC | PRN
  Administered 2015-08-15: 4.5 mL

## 2015-08-15 MED ORDER — LORATADINE 10 MG PO TABS
10.0000 mg | ORAL_TABLET | Freq: Every day | ORAL | Status: DC
  Administered 2015-08-16: 10 mg via ORAL
  Filled 2015-08-15: qty 1

## 2015-08-15 MED ORDER — HYDROMORPHONE HCL 1 MG/ML IJ SOLN
0.5000 mg | INTRAMUSCULAR | Status: DC | PRN
Start: 2015-08-15 — End: 2015-08-16
  Administered 2015-08-16: 1 mg via INTRAVENOUS
  Filled 2015-08-15: qty 1

## 2015-08-15 MED ORDER — METHOCARBAMOL 500 MG PO TABS
ORAL_TABLET | ORAL | Status: AC
  Administered 2015-08-15: 500 mg via ORAL
  Filled 2015-08-15: qty 1

## 2015-08-15 MED ORDER — FENTANYL CITRATE (PF) 100 MCG/2ML IJ SOLN
INTRAMUSCULAR | Status: DC | PRN
  Administered 2015-08-15: 150 ug via INTRAVENOUS
  Administered 2015-08-15 (×2): 50 ug via INTRAVENOUS

## 2015-08-15 MED ORDER — PRAZOSIN HCL 2 MG PO CAPS
2.0000 mg | ORAL_CAPSULE | Freq: Every day | ORAL | Status: DC
  Administered 2015-08-15: 2 mg via ORAL
  Filled 2015-08-15: qty 1

## 2015-08-15 MED ORDER — SODIUM CHLORIDE 0.9% FLUSH
3.0000 mL | Freq: Two times a day (BID) | INTRAVENOUS | Status: DC
Start: 2015-08-15 — End: 2015-08-16
  Administered 2015-08-15: 3 mL via INTRAVENOUS

## 2015-08-15 MED ORDER — KETOROLAC TROMETHAMINE 15 MG/ML IJ SOLN
15.0000 mg | Freq: Four times a day (QID) | INTRAMUSCULAR | Status: DC
  Administered 2015-08-15 – 2015-08-16 (×2): 15 mg via INTRAVENOUS
  Filled 2015-08-15 (×2): qty 1

## 2015-08-15 MED ORDER — HEMOSTATIC AGENTS (NO CHARGE) OPTIME
TOPICAL | Status: DC | PRN
  Administered 2015-08-15: 1 via TOPICAL

## 2015-08-15 MED ORDER — PHENYLEPHRINE 40 MCG/ML (10ML) SYRINGE FOR IV PUSH (FOR BLOOD PRESSURE SUPPORT)
PREFILLED_SYRINGE | INTRAVENOUS | Status: AC
Start: 2015-08-15 — End: 2015-08-15
  Filled 2015-08-15: qty 10

## 2015-08-15 MED ORDER — PROPOFOL 10 MG/ML IV BOLUS
INTRAVENOUS | Status: DC | PRN
  Administered 2015-08-15: 200 mg via INTRAVENOUS

## 2015-08-15 MED ORDER — ZOLPIDEM TARTRATE 5 MG PO TABS: 5.0000 mg | ORAL_TABLET | Freq: Every evening | ORAL | Status: DC | PRN

## 2015-08-15 MED ORDER — BISACODYL 10 MG RE SUPP: 10.0000 mg | Freq: Every day | RECTAL | Status: DC | PRN

## 2015-08-15 MED ORDER — EPHEDRINE 5 MG/ML INJ
INTRAVENOUS | Status: AC
  Filled 2015-08-15: qty 10

## 2015-08-15 SURGICAL SUPPLY — 55 items
BAG DECANTER FOR FLEXI CONT (MISCELLANEOUS) ×3 IMPLANT
BLADE CLIPPER SURG (BLADE) IMPLANT
BUR ACORN 6.0 (BURR) IMPLANT
BUR ACORN 6.0MM (BURR)
BUR MATCHSTICK NEURO 3.0 LAGG (BURR) ×3 IMPLANT
CANISTER SUCT 3000ML PPV (MISCELLANEOUS) ×3 IMPLANT
DECANTER SPIKE VIAL GLASS SM (MISCELLANEOUS) ×3 IMPLANT
DERMABOND ADVANCED (GAUZE/BANDAGES/DRESSINGS) ×2
DERMABOND ADVANCED .7 DNX12 (GAUZE/BANDAGES/DRESSINGS) ×1 IMPLANT
DEVICE DISSECT PLASMABLAD 3.0S (MISCELLANEOUS) ×1 IMPLANT
DRAPE LAPAROTOMY T 102X78X121 (DRAPES) ×3 IMPLANT
DRAPE MICROSCOPE LEICA (MISCELLANEOUS) ×3 IMPLANT
DRAPE POUCH INSTRU U-SHP 10X18 (DRAPES) ×3 IMPLANT
DRAPE PROXIMA HALF (DRAPES) ×3 IMPLANT
DURAPREP 26ML APPLICATOR (WOUND CARE) ×3 IMPLANT
ELECT REM PT RETURN 9FT ADLT (ELECTROSURGICAL) ×6
ELECTRODE REM PT RTRN 9FT ADLT (ELECTROSURGICAL) ×2 IMPLANT
GAUZE SPONGE 4X4 12PLY STRL (GAUZE/BANDAGES/DRESSINGS) IMPLANT
GAUZE SPONGE 4X4 16PLY XRAY LF (GAUZE/BANDAGES/DRESSINGS) IMPLANT
GLOVE BIOGEL PI IND STRL 7.0 (GLOVE) ×1 IMPLANT
GLOVE BIOGEL PI IND STRL 8 (GLOVE) ×2 IMPLANT
GLOVE BIOGEL PI IND STRL 8.5 (GLOVE) ×2 IMPLANT
GLOVE BIOGEL PI INDICATOR 7.0 (GLOVE) ×2
GLOVE BIOGEL PI INDICATOR 8 (GLOVE) ×4
GLOVE BIOGEL PI INDICATOR 8.5 (GLOVE) ×4
GLOVE ECLIPSE 8.5 STRL (GLOVE) IMPLANT
GLOVE EXAM NITRILE LRG STRL (GLOVE) IMPLANT
GLOVE EXAM NITRILE MD LF STRL (GLOVE) IMPLANT
GLOVE EXAM NITRILE XL STR (GLOVE) IMPLANT
GLOVE EXAM NITRILE XS STR PU (GLOVE) IMPLANT
GLOVE SURG SS PI 6.5 STRL IVOR (GLOVE) ×6 IMPLANT
GLOVE SURG SS PI 8.0 STRL IVOR (GLOVE) ×3 IMPLANT
GOWN STRL REUS W/ TWL LRG LVL3 (GOWN DISPOSABLE) ×1 IMPLANT
GOWN STRL REUS W/ TWL XL LVL3 (GOWN DISPOSABLE) ×1 IMPLANT
GOWN STRL REUS W/TWL 2XL LVL3 (GOWN DISPOSABLE) ×6 IMPLANT
GOWN STRL REUS W/TWL LRG LVL3 (GOWN DISPOSABLE) ×2
GOWN STRL REUS W/TWL XL LVL3 (GOWN DISPOSABLE) ×2
KIT BASIN OR (CUSTOM PROCEDURE TRAY) ×3 IMPLANT
KIT ROOM TURNOVER OR (KITS) ×3 IMPLANT
NEEDLE HYPO 22GX1.5 SAFETY (NEEDLE) ×3 IMPLANT
NEEDLE SPNL 20GX3.5 QUINCKE YW (NEEDLE) ×3 IMPLANT
NS IRRIG 1000ML POUR BTL (IV SOLUTION) ×3 IMPLANT
PACK LAMINECTOMY NEURO (CUSTOM PROCEDURE TRAY) ×3 IMPLANT
PAD ARMBOARD 7.5X6 YLW CONV (MISCELLANEOUS) ×9 IMPLANT
PATTIES SURGICAL .5 X1 (DISPOSABLE) IMPLANT
PLASMABLADE 3.0S (MISCELLANEOUS) ×3
RUBBERBAND STERILE (MISCELLANEOUS) ×6 IMPLANT
SPONGE SURGIFOAM ABS GEL SZ50 (HEMOSTASIS) ×3 IMPLANT
SUT VIC AB 1 CT1 18XBRD ANBCTR (SUTURE) ×1 IMPLANT
SUT VIC AB 1 CT1 8-18 (SUTURE) ×2
SUT VIC AB 2-0 CP2 18 (SUTURE) ×3 IMPLANT
SUT VIC AB 3-0 SH 8-18 (SUTURE) ×3 IMPLANT
TOWEL OR 17X24 6PK STRL BLUE (TOWEL DISPOSABLE) ×3 IMPLANT
TOWEL OR 17X26 10 PK STRL BLUE (TOWEL DISPOSABLE) ×3 IMPLANT
WATER STERILE IRR 1000ML POUR (IV SOLUTION) ×3 IMPLANT

## 2015-08-15 NOTE — Transfer of Care (Signed)
Immediate Anesthesia Transfer of Care Note  Patient: [name]  Procedure(s) Performed: Procedure(s): Left Lumbar four-five Microdiskectomy (Left)  Patient Location: PACU  Anesthesia Type:General  Level of Consciousness: awake and alert   Airway & Oxygen Therapy: Patient Spontanous Breathing and Patient connected to nasal cannula oxygen  Post-op Assessment: Report given to RN, Post -op Vital signs reviewed and stable, Patient moving all extremities and Patient moving all extremities X 4  Post vital signs: Reviewed and stable  Last Vitals:  Filed Vitals:   08/15/15 1136 08/15/15 1759  BP: 105/74   Pulse:    Temp:  36.1 C  Resp:      Last Pain:  Filed Vitals:   08/15/15 1801  PainSc: 8       Patients Stated Pain Goal: 3 ([id number])  Complications: No apparent anesthesia complications

## 2015-08-15 NOTE — Progress Notes (Signed)
Patient ID: [name], female   DOB: [birth year], 30 y.o.   MRN: [id number] Vital signs are stable. Patient is complaining of pain Motor function appears intact in lower extremities Dressing is clean and dry.

## 2015-08-15 NOTE — OR Nursing (Signed)
Pt mother called back and was updated. Explained to mother that the surgery before was taking longer than expected and she had not gone back to surgery yet. Informed mother that pt was sleeping at this time.

## 2015-08-15 NOTE — Progress Notes (Signed)
Patient stop crying, ordering food at [facility], when asked about the pain she said "it's burning".

## 2015-08-15 NOTE — Anesthesia Preprocedure Evaluation (Addendum)
Anesthesia Evaluation  Patient identified by MRN, date of birth, ID band Patient awake    Reviewed: Allergy & Precautions, NPO status , Patient's Chart, lab work & pertinent test results  History of Anesthesia Complications Negative for: history of anesthetic complications  Airway Mallampati: II  TM Distance: >3 FB Neck ROM: Full    Dental no notable dental hx. (+) Poor Dentition, Missing   Pulmonary asthma , Current Smoker,    Pulmonary exam normal breath sounds clear to auscultation       Cardiovascular negative cardio ROS Normal cardiovascular exam Rhythm:Regular Rate:Normal     Neuro/Psych  Headaches, PSYCHIATRIC DISORDERS (panic attacks) Anxiety Bipolar Disorder Sciatica    GI/Hepatic Neg liver ROS, GERD  Medicated and Controlled,  Endo/Other  Morbid obesity  Renal/GU negative Renal ROS   PID    Musculoskeletal  (+) Arthritis  (DDD), Scoliosis    Abdominal (+) + obese,   Peds negative pediatric ROS (+)  Hematology negative hematology ROS (+)   Anesthesia Other Findings   Reproductive/Obstetrics negative OB ROS                           Anesthesia Physical Anesthesia Plan  ASA: III  Anesthesia Plan: General   Post-op Pain Management:    Induction: Intravenous  Airway Management Planned: Oral ETT  Additional Equipment:   Intra-op Plan:   Post-operative Plan: Extubation in OR  Informed Consent: I have reviewed the patients History and Physical, chart, labs and discussed the procedure including the risks, benefits and alternatives for the proposed anesthesia with the patient or authorized representative who has indicated his/her understanding and acceptance.   Dental advisory given  Plan Discussed with: CRNA  Anesthesia Plan Comments: (Risks/benefits of general anesthesia discussed with patient including risk of damage to teeth, lips, gum, and tongue, nausea/vomiting,  allergic reactions to medications, and the possibility of heart attack, stroke and death.  All patient questions answered.  Patient wishes to proceed.)        Anesthesia Quick Evaluation

## 2015-08-15 NOTE — Op Note (Signed)
Date of surgery: 08/15/2015 Preoperative diagnosis: Herniated nucleus pulposus L4-L5 left Postoperative diagnosis: Herniated nucleus pulposus L4-L5 left left lumbar radiculopathy Procedure: Microdiscectomy L4-L5 left with operating microscope microdissection technique Surgeon: Kristeen Miss M.D. Assistant: Dominica Severin CR a.m., M.D.  Indications: Patient is a 30 year old female who several months ago had a herniated nucleus pulposus at L4-L5 on the right side. She underwent a microdiscectomy at that time and received good relief. Few months ago she had recurrent pain on the left side an MRI now demonstrates a large extruded fragment of disc at L4-L5 on the left. She is taken to the operating room to undergo surgical decompression.  Procedure: The patient was brought to the operating room supine on a stretcher. After the smooth induction of general endotracheal anesthesia patient was carefully turned to the prone position with the bony prominences being appropriately padded and protected. The lower lumbar spine was prepped with alcohol and DuraPrep and then draped in a sterile fashion. The needle was used to localize the area of L4-5 and a radiograph was obtained demonstrating a needle at the level of L5-S1. Skin was infiltrated with 10 cc of lidocaine 1-100,000 epinephrine mixed 50-50 with half percent Marcaine. An incision was made and carried down to the lumbar dorsal fascia the fascia was opened on the L4-5 side of the midline and a subperiosteal dissection was performed in the interlaminar space of L4-5. A self-retaining retractor was then placed into the wound. The yellow ligament was taken up from the interlaminar space at L4-5 and the inferior margin of the lamina of L4 was removed and a partial facetectomy was performed at L4-5. The common dural tube was explored and the take off of the L5 nerve root was identified and gradually mobilized. Almost immediately large fragments of disc him into view and these  were removed in a piecemeal fashion using a micropituitary rongeur. The disc material was confluent with the disc space and the operating microscope was draped and brought into the field. Microdiscectomy was performed.  The disc space was evacuated of a significant quantity of moderately degenerated disc material. Dissection was carried out under the use of the operating microscope with the assistant providing traction and exposure and decompressing the lateral portion of the sac while I worked medially.  After adequate decompression was obtained hemostasis and the soft tissues was verified retractor was removed the operating microscope was removed from the field and the lumbar dorsal fascia was closed with #1 and 2-0 Vicryl in interrupted fashion. 3-0 Vicryl was used to close subcuticular skin. Blood loss was estimated at 75 mL.

## 2015-08-15 NOTE — H&P (Signed)
[name] is an 30 y.o. female.   Chief Complaint: Back and left leg pain HPI: Patient is a 30 year old individual who a little over a year ago had a herniated nucleus pulposus at L4-L5 on the right side. This was a large extruded fragment of disc the cause severe radicular pain. After she underwent surgical decompression she felt significantly better and done well until last few months when she suddenly developed pain in her left leg similar to what she experienced previously and MRI demonstrated that she has a large extruded fragment of disc at the same level on the left side now. She is failed efforts at conservative management was advised regarding surgery.  Past Medical History  Diagnosis Date  . Scoliosis   . Obesity   . Migraine   . Gallstones   . Asthma   . Arthritis   . Pelvic inflammatory disease (PID) 05-08-11    previous hx. .Hx. childbirth x3-NVD  . Leukocytosis     going to hematologist  . History of renal failure   . Edema   . History of anemia   . Cancer ([place])   . Cervical cancer ([place])   . Anxiety     Panic attack  . DDD (degenerative disc disease), cervical   . PTSD (post-traumatic stress disorder)   . GERD (gastroesophageal reflux disease)   . Constipation   . PID (pelvic inflammatory disease)   . HPV (human papilloma virus) anogenital infection   . Sciatica   . Bipolar 1 disorder ([place])   . Obesity   . Chronic kidney disease     history of kidney shut down  no current problems  . Anemia     Past Surgical History  Procedure Laterality Date  . Abdominal exploration surgery  approx 30 years old    rlq(due to adhesions around ovaries)-appendix and ovaries remain  . Cholecystectomy  05/10/2011    Procedure: LAPAROSCOPIC CHOLECYSTECTOMY WITH INTRAOPERATIVE CHOLANGIOGRAM;  Surgeon: Gayland Curry, MD,FACS;  Location: [facility];  Service: General;  Laterality: N/A;  . Anterior cervical decomp/discectomy fusion N/A 12/09/2012    Procedure: ANTERIOR CERVICAL  DECOMPRESSION/DISCECTOMY FUSION STRUCTURAL ALLOGRAFT TRESTLE PLATE CERVICAL FIVE-SIX,SIX-SEVEN;  Surgeon: Otilio Connors, MD;  Location: [place] [facility];  Service: Neurosurgery;  Laterality: N/A;  . Lumbar laminectomy/decompression microdiscectomy Left 11/30/2014    Procedure: Left lumbar four-five Microdiskectomy;  Surgeon: Kristeen Miss, MD;  Location: [place] [facility];  Service: Neurosurgery;  Laterality: Left;    Family History  Problem Relation Age of Onset  . Prostate cancer Maternal Grandfather   . Cancer Maternal Grandfather     prostate  . Colon polyps Maternal Grandmother   . Diabetes Mother   . Endometriosis Mother   . Cancer Mother     lung  . Diabetes Father   . Heart disease Father   . Cirrhosis Paternal Grandfather    Social History:  reports that she has been smoking Cigarettes.  She has a 6 pack-year smoking history. She has never used smokeless tobacco. She reports that she uses illicit drugs (Marijuana). She reports that she does not drink alcohol.  Allergies:  Allergies  Allergen Reactions  . Banana Other (See Comments)      Itchy throat  . Latex Itching and Rash    Red spots  . Orange Oil Other (See Comments)    Mouth pain and acid reflux Any oranges  . Miconazole Rash and Other (See Comments)    Increases yeasts    No prescriptions prior  to admission    No results found for this or any previous visit (from the past 48 hour(s)). No results found.  Review of Systems  HENT: Negative.   Eyes: Negative.   Respiratory: Negative.   Cardiovascular: Negative.   Gastrointestinal: Negative.   Genitourinary: Negative.   Musculoskeletal: Positive for back pain.  Skin: Negative.   Neurological: Positive for weakness.       Left lower extremity pain and weakness  Endo/Heme/Allergies: Negative.   Psychiatric/Behavioral: Negative.     There were no vitals taken for this visit. Physical Exam  Constitutional: She is oriented to person, place, and time. She appears  well-developed and well-nourished.  HENT:  Head: Normocephalic and atraumatic.  Eyes: Conjunctivae and EOM are normal. Pupils are equal, round, and reactive to light.  Neck: Normal range of motion. Neck supple.  Cardiovascular: Normal rate and regular rhythm.   Respiratory: Effort normal and breath sounds normal.  GI: Soft. Bowel sounds are normal.  Musculoskeletal: Normal range of motion.  Neurological: She is alert and oriented to person, place, and time.  Positive straight leg raising on the left at 15 moderate back pain and tenderness  Skin: Skin is warm and dry.  Psychiatric: She has a normal mood and affect. Her behavior is normal. Judgment and thought content normal.     Assessment/Plan Herniated nucleus pulposus L4-L5 left with left lumbar radiculopathy  Plan: Microdiscectomy L4-L5 left  Earleen Newport, MD 08/15/2015, 9:33 AM

## 2015-08-15 NOTE — Anesthesia Procedure Notes (Signed)
Procedure Name: Intubation Date/Time: 08/15/2015 4:23 PM Performed by: Willeen Cass P Pre-anesthesia Checklist: Patient identified, Timeout performed, Emergency Drugs available, Suction available and Patient being monitored Patient Re-evaluated:Patient Re-evaluated prior to inductionOxygen Delivery Method: Circle system utilized Preoxygenation: Pre-oxygenation with 100% oxygen Intubation Type: IV induction Ventilation: Mask ventilation without difficulty Laryngoscope Size: Mac and 3 Grade View: Grade I Tube type: Oral Tube size: 7.0 mm Number of attempts: 1 Placement Confirmation: ETT inserted through vocal cords under direct vision,  breath sounds checked- equal and bilateral and positive ETCO2 Secured at: 23 cm Tube secured with: Tape Dental Injury: Teeth and Oropharynx as per pre-operative assessment

## 2015-08-15 NOTE — Progress Notes (Signed)
Writer talking to patient, not complaining of any pain, not crying as of this time.

## 2015-08-16 ENCOUNTER — Encounter (HOSPITAL_COMMUNITY): Payer: Self-pay | Admitting: Neurological Surgery

## 2015-08-16 DIAGNOSIS — M5116 Intervertebral disc disorders with radiculopathy, lumbar region: Secondary | ICD-10-CM | POA: Diagnosis not present

## 2015-08-16 MED ORDER — OXYCODONE-ACETAMINOPHEN 5-325 MG PO TABS: 1.0000 | ORAL_TABLET | ORAL | Status: DC | PRN

## 2015-08-16 MED ORDER — METHOCARBAMOL 500 MG PO TABS: 500.0000 mg | ORAL_TABLET | Freq: Four times a day (QID) | ORAL | Status: DC | PRN

## 2015-08-16 NOTE — Discharge Summary (Signed)
Physician Discharge Summary  Patient ID: [name] MRN: [id number] DOB/AGE: 11-08-1985 30 y.o.  Admit date: 08/15/2015 Discharge date: 08/16/2015  Admission Diagnoses:Herniated nucleus pulposus L4-5 left with left lumbar radiculopathy  Discharge Diagnoses: Herniated nucleus pulposus L4-5 left with left lumbar radiculopathy Active Problems:   Herniated nucleus pulposus, L4-5 left   Discharged Condition: good  Hospital Course: Patient was admitted to undergo surgery which she tolerated well.  Consults: None  Significant Diagnostic Studies: None  Treatments: surgery: Microdiscectomy L4-5 left with left L5 nerve root decompression, operating microscope microdissection technique  Discharge Exam: Blood pressure 89/36, pulse 66, temperature 97.8 F (36.6 C), temperature source Oral, resp. rate 16, height 5\' 3"  (1.6 m), weight 107.049 kg (236 lb), SpO2 96 %. Incision is clean and dry motor function is intact.  Disposition: 01-Home or Self Care  Discharge Instructions    Call MD for:  redness, tenderness, or signs of infection (pain, swelling, redness, odor or green/yellow discharge around incision site)    Complete by:  As directed      Call MD for:  severe uncontrolled pain    Complete by:  As directed      Call MD for:  temperature >100.4    Complete by:  As directed      Diet - low sodium heart healthy    Complete by:  As directed      Discharge instructions    Complete by:  As directed   Okay to shower. Do not apply salves or appointments to incision. No heavy lifting with the upper extremities greater than 15 pounds. May resume driving when not requiring pain medication and patient feels comfortable with doing so.     Increase activity slowly    Complete by:  As directed             Medication List    STOP taking these medications        predniSONE 20 MG tablet  Commonly known as:  DELTASONE      TAKE these medications        albuterol 108 (90 Base) MCG/ACT  inhaler  Commonly known as:  PROVENTIL HFA;VENTOLIN HFA  Inhale 1 puff into the lungs every 6 (six) hours as needed for wheezing or shortness of breath.     ALPRAZolam 0.5 MG tablet  Commonly known as:  XANAX  Take 0.5 mg by mouth 2 (two) times daily.     ALPRAZolam 1 MG tablet  Commonly known as:  XANAX  Take 1 mg by mouth 2 (two) times daily as needed for anxiety.     AMBIEN 10 MG tablet  Generic drug:  zolpidem  Take 10 mg by mouth at bedtime as needed for sleep.     ARIPiprazole 5 MG tablet  Commonly known as:  ABILIFY  Take 5 mg by mouth daily.     busPIRone 15 MG tablet  Commonly known as:  BUSPAR  Take 15 mg by mouth 2 (two) times daily.     cetirizine 5 MG tablet  Commonly known as:  ZYRTEC  Take 1 tablet (5 mg total) by mouth daily.     diclofenac 75 MG EC tablet  Commonly known as:  VOLTAREN  Take 75 mg by mouth 2 (two) times daily.     esomeprazole 40 MG capsule  Commonly known as:  NEXIUM  Take 40 mg by mouth daily.     FLUoxetine 20 MG capsule  Commonly known as:  PROZAC  Take 80 mg  by mouth daily.     methocarbamol 500 MG tablet  Commonly known as:  ROBAXIN  Take 1 tablet (500 mg total) by mouth every 8 (eight) hours as needed for muscle spasms.     methocarbamol 500 MG tablet  Commonly known as:  ROBAXIN  Take 1 tablet (500 mg total) by mouth every 6 (six) hours as needed for muscle spasms.     ondansetron 4 MG tablet  Commonly known as:  ZOFRAN  Take 1 tablet (4 mg total) by mouth every 8 (eight) hours as needed for nausea or vomiting.     oxyCODONE-acetaminophen 5-325 MG tablet  Commonly known as:  PERCOCET/ROXICET  Take 1-2 tablets by mouth every 4 (four) hours as needed for moderate pain.     oxyCODONE-acetaminophen 5-325 MG tablet  Commonly known as:  PERCOCET/ROXICET  Take 1-2 tablets by mouth every 4 (four) hours as needed for moderate pain.     prazosin 2 MG capsule  Commonly known as:  MINIPRESS  Take 2 mg by mouth at bedtime.      traZODone 50 MG tablet  Commonly known as:  DESYREL  Take 100 mg by mouth at bedtime.         SignedEarleen Newport 08/16/2015, 11:32 AM

## 2015-08-16 NOTE — Discharge Instructions (Signed)

## 2015-08-16 NOTE — Progress Notes (Signed)
Pt and mother given D/C instructions with Rx's, verbal understanding was provided. Pt's incision is open to air and has no sign of infection. Pt's IV was removed prior to D/C. Pt D/C'd home via walking @ 1150 per MD order. Pt is stable @ D/C and has no other needs at this time. Holli Humbles, RN

## 2015-08-16 NOTE — Evaluation (Signed)
Occupational Therapy Evaluation Patient Details Name: [name] MRN: [id number] DOB: 20-Nov-1985 Today's Date: 08/16/2015    History of Present Illness 30 yo female s/p L5-s1 microdiscectomy. PMH: scoliosis, obesity, arthritis, cervical CA, DDD< PTSD, GERD,PID, bipolar, anemnia ACDF 11/2014 laminectomy surg   Clinical Impression   Patient evaluated by Occupational Therapy with no further acute OT needs identified. All education has been completed and the patient has no further questions. See below for any follow-up Occupational Therapy or equipment needs. OT to sign off. Thank you for referral.      Follow Up Recommendations  No OT follow up    Equipment Recommendations  None recommended by OT    Recommendations for Other Services       Precautions / Restrictions Precautions Precautions: Back;Fall Precaution Comments: back handout present and reviewed for adls      Mobility Bed Mobility Overal bed mobility: Modified Independent                Transfers Overall transfer level: Independent                    Balance                                            ADL Overall ADL's : At baseline                                       General ADL Comments: pt able to cross bil LE and doff sock and don new socks. Pt able to simulate tub bench transfer and lift bil LE over edge of tub. pt able to complete bed mobility. Pt will have 24/ 7 family (A) as needed. 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 Pt has 31 yo and 40 yo children that can assist.      Vision Vision Assessment?: No apparent visual deficits   Perception     Praxis      Pertinent Vitals/Pain Pain Assessment: No/denies pain     Hand Dominance Right   Extremity/Trunk Assessment Upper Extremity Assessment Upper Extremity Assessment: Overall WFL for tasks assessed   Lower Extremity Assessment Lower Extremity Assessment: Defer to PT  evaluation   Cervical / Trunk Assessment Cervical / Trunk Assessment: Other exceptions (ACDF surg hx)   Communication Communication Communication: No difficulties   Cognition Arousal/Alertness: Awake/alert Behavior During Therapy: WFL for tasks assessed/performed Overall Cognitive Status: Within Functional Limits for tasks assessed                     General Comments       Exercises       Shoulder Instructions      Home Living Family/patient expects to be discharged to:: Private residence Living Arrangements: Spouse/significant other Available Help at Discharge: Family;Available 24 hours/day Type of Home: Apartment Home Access: Stairs to enter Entrance Stairs-Number of Steps: 2   Home Layout: Two level Alternate Level Stairs-Number of Steps: Flight   Bathroom Shower/Tub: Teacher, early years/pre: Standard Bathroom Accessibility: No   Home Equipment: Tub bench;Bedside commode   Additional Comments: borrowing bench, 3n1 and recliner from family      Prior Functioning/Environment Level of Independence: Needs assistance    ADL's / Homemaking Assistance Needed: husband (A)  as needed. Has w/c if needed for BIL LE weakness        OT Diagnosis:     OT Problem List:     OT Treatment/Interventions:      OT Goals(Current goals can be found in the care plan section)    OT Frequency:     Barriers to D/C:            Co-evaluation              End of Session Nurse Communication: Mobility status;Precautions  Activity Tolerance: Patient tolerated treatment well Patient left: in bed;with call bell/phone within reach   Time: 0815-0838 OT Time Calculation (min): 23 min Charges:  OT General Charges $OT Visit: 1 Procedure OT Evaluation $OT Eval Moderate Complexity: 1 Procedure G-Codes:    [name] 11-Sep-2015, 8:51 AM   [name]   OTR/L PagerOH:[phone] Office: [phone] .

## 2015-08-16 NOTE — Anesthesia Postprocedure Evaluation (Signed)
Anesthesia Post Note  Patient: [name]  Procedure(s) Performed: Procedure(s) (LRB): Left Lumbar four-five Microdiskectomy (Left)  Patient location during evaluation: PACU Anesthesia Type: General Level of consciousness: awake and alert Pain management: pain level controlled Vital Signs Assessment: post-procedure vital signs reviewed and stable Respiratory status: spontaneous breathing, nonlabored ventilation, respiratory function stable and patient connected to nasal cannula oxygen Cardiovascular status: blood pressure returned to baseline and stable Postop Assessment: no signs of nausea or vomiting Anesthetic complications: no    Last Vitals:  Filed Vitals:   08/16/15 0400 08/16/15 0856  BP: 98/52 89/36  Pulse: 64 66  Temp: 36.5 C 36.6 C  Resp: 18 16    Last Pain:  Filed Vitals:   08/16/15 1322  PainSc: [place]

## 2015-08-16 NOTE — Progress Notes (Signed)
OT NOTE  Pt concerned with 2nd level home and stairs. Pt requesting [employer] housing place patient in a ground level apartment and one is current available.   Pt could benefit from: Handicap height toilets Grab bars in tub / shower Level entry    Pt reports x3 falls on stairs since previous surg.   MD please address concerns with patient. Pt asking for notes and documentation to help with filing for change of apartment.   [name]   OTR/L Pager: [phone] Office: [phone] .

## 2015-08-16 NOTE — Evaluation (Signed)
Physical Therapy Evaluation and Discharge Patient Details Name: [name] MRN: [id number] DOB: Jan 29, 1986 Today's Date: 08/16/2015   History of Present Illness  30 yo female s/p L5-s1 microdiscectomy. PMH: scoliosis, obesity, arthritis, cervical CA, DDD< PTSD, GERD,PID, bipolar, anemnia ACDF 11/2014 laminectomy surg  Clinical Impression  Patient evaluated by Physical Therapy with no further acute PT needs identified. All education has been completed and the patient has no further questions. At the time of PT eval pt was able to perform transfers and ambulation with modified independence to independence. See below for any follow-up Physical Therapy or equipment needs. PT is signing off. Thank you for this referral.   Of note: Pt concerned with 2nd level home and stairs. Pt requesting [employer] housing place patient in a ground level apartment and one is current available. Pt could benefit from: Handicap height toilets, Grab bars in tub / shower, Level entry. Pt reports to OT x3 falls on stairs since previous surg. Pt asking for notes and documentation to help with filing for change of apartment.    Follow Up Recommendations Outpatient PT;Supervision for mobility/OOB    Equipment Recommendations  None recommended by PT    Recommendations for Other Services       Precautions / Restrictions Precautions Precautions: Back;Fall Precaution Comments: back handout present and reviewed for adls Restrictions Weight Bearing Restrictions: No      Mobility  Bed Mobility Overal bed mobility: Modified Independent                Transfers Overall transfer level: Independent                  Ambulation/Gait Ambulation/Gait assistance: Modified independent (Device/Increase time) Ambulation Distance (Feet): 400 Feet Assistive device: None Gait Pattern/deviations: Step-through pattern;Decreased stride length;Trunk flexed Gait velocity: Decreased Gait velocity interpretation: Below  normal speed for age/gender General Gait Details: Slow but steady gait. Minimal VC's for maitenance of precautions.   Stairs Stairs: Yes Stairs assistance: Supervision Stair Management: One rail Left;Step to pattern;Forwards Number of Stairs: 10 General stair comments: No assist required. VC's for sequencing/safety.  Wheelchair Mobility    Modified Rankin (Stroke Patients Only)       Balance Overall balance assessment: Needs assistance Sitting-balance support: Feet supported;No upper extremity supported Sitting balance-Leahy Scale: Good     Standing balance support: No upper extremity supported;During functional activity Standing balance-Leahy Scale: Fair                               Pertinent Vitals/Pain Pain Assessment: Faces Faces Pain Scale: Hurts a little bit Pain Location: Incision site Pain Descriptors / Indicators: Operative site guarding;Sore Pain Intervention(s): Limited activity within patient's tolerance;Monitored during session;Repositioned    Home Living Family/patient expects to be discharged to:: Private residence Living Arrangements: Spouse/significant other Available Help at Discharge: Family;Available 24 hours/day Type of Home: Apartment Home Access: Stairs to enter   Entrance Stairs-Number of Steps: 2 Home Layout: Two level Home Equipment: Tub bench;Bedside commode Additional Comments: borrowing bench, 3n1 and recliner from family    Prior Function Level of Independence: Needs assistance      ADL's / Homemaking Assistance Needed: husband (A) as needed. Has w/c if needed for BIL LE weakness        Hand Dominance   Dominant Hand: Right    Extremity/Trunk Assessment   Upper Extremity Assessment: Defer to OT evaluation           Lower  Extremity Assessment: Generalized weakness      Cervical / Trunk Assessment: Other exceptions (ACDF surg hx)  Communication   Communication: No difficulties  Cognition  Arousal/Alertness: Awake/alert Behavior During Therapy: WFL for tasks assessed/performed Overall Cognitive Status: Within Functional Limits for tasks assessed                      General Comments      Exercises        Assessment/Plan    PT Assessment Patent does not need any further PT services  PT Diagnosis Difficulty walking;Acute pain   PT Problem List    PT Treatment Interventions     PT Goals (Current goals can be found in the Care Plan section) Acute Rehab PT Goals Patient Stated Goal: Home today PT Goal Formulation: All assessment and education complete, DC therapy    Frequency     Barriers to discharge        Co-evaluation               End of Session   Activity Tolerance: Patient tolerated treatment well Patient left: in bed;with call bell/phone within reach Nurse Communication: Mobility status    Functional Assessment Tool Used: Clinical judgement Functional Limitation: Mobility: Walking and moving around Mobility: Walking and Moving Around Current Status [id number]): At least 1 percent but less than 20 percent impaired, limited or restricted Mobility: Walking and Moving Around Goal Status [phone]): At least 1 percent but less than 20 percent impaired, limited or restricted Mobility: Walking and Moving Around Discharge Status [phone]): At least 1 percent but less than 20 percent impaired, limited or restricted    Time: 0731-0750 PT Time Calculation (min) (ACUTE ONLY): 19 min   Charges:   PT Evaluation $PT Eval Moderate Complexity: 1 Procedure     PT G Codes:   PT G-Codes **NOT FOR INPATIENT CLASS** Functional Assessment Tool Used: Clinical judgement Functional Limitation: Mobility: Walking and moving around Mobility: Walking and Moving Around Current Status [id number]): At least 1 percent but less than 20 percent impaired, limited or restricted Mobility: Walking and Moving Around Goal Status [phone]): At least 1 percent but less than 20 percent  impaired, limited or restricted Mobility: Walking and Moving Around Discharge Status [phone]): At least 1 percent but less than 20 percent impaired, limited or restricted    [name] 08/16/2015, 1:39 PM   [name], PT, DPT Acute Rehabilitation Services Pager: [phone]

## 2015-08-22 ENCOUNTER — Emergency Department (HOSPITAL_COMMUNITY): Payer: Medicaid Other

## 2015-08-22 ENCOUNTER — Emergency Department (HOSPITAL_COMMUNITY)
Admission: EM | Admit: 2015-08-22 | Discharge: 2015-08-22 | Disposition: A | Discharge status: Home / Self Care | Payer: Medicaid Other | Attending: Emergency Medicine | Admitting: Emergency Medicine

## 2015-08-22 ENCOUNTER — Encounter (HOSPITAL_COMMUNITY): Payer: Self-pay

## 2015-08-22 DIAGNOSIS — F4310 Post-traumatic stress disorder, unspecified: Secondary | ICD-10-CM | POA: Diagnosis not present

## 2015-08-22 DIAGNOSIS — E669 Obesity, unspecified: Secondary | ICD-10-CM | POA: Diagnosis not present

## 2015-08-22 DIAGNOSIS — Z8541 Personal history of malignant neoplasm of cervix uteri: Secondary | ICD-10-CM | POA: Insufficient documentation

## 2015-08-22 DIAGNOSIS — F17210 Nicotine dependence, cigarettes, uncomplicated: Secondary | ICD-10-CM | POA: Insufficient documentation

## 2015-08-22 DIAGNOSIS — R200 Anesthesia of skin: Secondary | ICD-10-CM | POA: Insufficient documentation

## 2015-08-22 DIAGNOSIS — F410 Panic disorder [episodic paroxysmal anxiety] without agoraphobia: Secondary | ICD-10-CM | POA: Insufficient documentation

## 2015-08-22 DIAGNOSIS — Z862 Personal history of diseases of the blood and blood-forming organs and certain disorders involving the immune mechanism: Secondary | ICD-10-CM | POA: Insufficient documentation

## 2015-08-22 DIAGNOSIS — J45909 Unspecified asthma, uncomplicated: Secondary | ICD-10-CM | POA: Insufficient documentation

## 2015-08-22 DIAGNOSIS — M419 Scoliosis, unspecified: Secondary | ICD-10-CM | POA: Diagnosis not present

## 2015-08-22 DIAGNOSIS — R61 Generalized hyperhidrosis: Secondary | ICD-10-CM | POA: Diagnosis not present

## 2015-08-22 DIAGNOSIS — R197 Diarrhea, unspecified: Secondary | ICD-10-CM | POA: Insufficient documentation

## 2015-08-22 DIAGNOSIS — N189 Chronic kidney disease, unspecified: Secondary | ICD-10-CM | POA: Insufficient documentation

## 2015-08-22 DIAGNOSIS — Z91040 Latex allergy status: Secondary | ICD-10-CM | POA: Diagnosis not present

## 2015-08-22 DIAGNOSIS — F319 Bipolar disorder, unspecified: Secondary | ICD-10-CM | POA: Insufficient documentation

## 2015-08-22 DIAGNOSIS — G43909 Migraine, unspecified, not intractable, without status migrainosus: Secondary | ICD-10-CM | POA: Insufficient documentation

## 2015-08-22 DIAGNOSIS — M545 Low back pain: Secondary | ICD-10-CM | POA: Insufficient documentation

## 2015-08-22 DIAGNOSIS — M1990 Unspecified osteoarthritis, unspecified site: Secondary | ICD-10-CM | POA: Insufficient documentation

## 2015-08-22 DIAGNOSIS — K219 Gastro-esophageal reflux disease without esophagitis: Secondary | ICD-10-CM | POA: Diagnosis not present

## 2015-08-22 DIAGNOSIS — Z791 Long term (current) use of non-steroidal anti-inflammatories (NSAID): Secondary | ICD-10-CM | POA: Insufficient documentation

## 2015-08-22 DIAGNOSIS — Z8742 Personal history of other diseases of the female genital tract: Secondary | ICD-10-CM | POA: Diagnosis not present

## 2015-08-22 DIAGNOSIS — Z8619 Personal history of other infectious and parasitic diseases: Secondary | ICD-10-CM | POA: Insufficient documentation

## 2015-08-22 DIAGNOSIS — R1110 Vomiting, unspecified: Secondary | ICD-10-CM | POA: Diagnosis not present

## 2015-08-22 DIAGNOSIS — Z79899 Other long term (current) drug therapy: Secondary | ICD-10-CM | POA: Diagnosis not present

## 2015-08-22 LAB — URINALYSIS, ROUTINE W REFLEX MICROSCOPIC
GLUCOSE, UA: NEGATIVE mg/dL
Hgb urine dipstick: NEGATIVE
Ketones, ur: 15 mg/dL — AB
LEUKOCYTES UA: NEGATIVE
Nitrite: NEGATIVE
PH: 8 (ref 5.0–8.0)
Protein, ur: NEGATIVE mg/dL
Specific Gravity, Urine: 1.025 (ref 1.005–1.030)

## 2015-08-22 LAB — COMPREHENSIVE METABOLIC PANEL
ALT: 17 U/L (ref 14–54)
AST: 21 U/L (ref 15–41)
Albumin: 3.7 g/dL (ref 3.5–5.0)
Alkaline Phosphatase: 73 U/L (ref 38–126)
Anion gap: 9 (ref 5–15)
BUN: 12 mg/dL (ref 6–20)
CHLORIDE: 106 mmol/L (ref 101–111)
CO2: 22 mmol/L (ref 22–32)
Calcium: 9.5 mg/dL (ref 8.9–10.3)
Creatinine, Ser: 0.95 mg/dL (ref 0.44–1.00)
Glucose, Bld: 82 mg/dL (ref 65–99)
POTASSIUM: 3.7 mmol/L (ref 3.5–5.1)
SODIUM: 137 mmol/L (ref 135–145)
Total Bilirubin: 0.7 mg/dL (ref 0.3–1.2)
Total Protein: 7.2 g/dL (ref 6.5–8.1)

## 2015-08-22 LAB — CBC
HEMATOCRIT: 36.9 % (ref 36.0–46.0)
Hemoglobin: 12 g/dL (ref 12.0–15.0)
MCH: 25.7 pg — ABNORMAL LOW (ref 26.0–34.0)
MCHC: 32.5 g/dL (ref 30.0–36.0)
MCV: 79 fL (ref 78.0–100.0)
Platelets: 323 10*3/uL (ref 150–400)
RBC: 4.67 MIL/uL (ref 3.87–5.11)
RDW: 17.2 % — ABNORMAL HIGH (ref 11.5–15.5)
WBC: 12.8 10*3/uL — AB (ref 4.0–10.5)

## 2015-08-22 LAB — LIPASE, BLOOD: LIPASE: 18 U/L (ref 11–51)

## 2015-08-22 MED ORDER — ONDANSETRON 4 MG PO TBDP
ORAL_TABLET | ORAL | Status: AC
  Filled 2015-08-22: qty 1

## 2015-08-22 MED ORDER — IBUPROFEN 200 MG PO TABS
400.0000 mg | ORAL_TABLET | Freq: Once | ORAL | Status: AC
  Administered 2015-08-22: 400 mg via ORAL
  Filled 2015-08-22: qty 2

## 2015-08-22 MED ORDER — ONDANSETRON 4 MG PO TBDP
4.0000 mg | ORAL_TABLET | Freq: Once | ORAL | Status: AC | PRN
  Administered 2015-08-22: 4 mg via ORAL

## 2015-08-22 MED ORDER — CYCLOBENZAPRINE HCL 10 MG PO TABS
5.0000 mg | ORAL_TABLET | Freq: Once | ORAL | Status: AC
  Administered 2015-08-22: 5 mg via ORAL
  Filled 2015-08-22: qty 1

## 2015-08-22 MED ORDER — CYCLOBENZAPRINE HCL 5 MG PO TABS: 10.0000 mg | ORAL_TABLET | Freq: Three times a day (TID) | ORAL | Status: DC | PRN

## 2015-08-22 NOTE — Discharge Instructions (Signed)
Read the information below.   Use the prescribed medication as directed.  Please discuss all new medications with your pharmacist.   You are being prescribed flexeril. Discontinue robaxin. Take as needed for muscle spasms. You can take ibuprofen, aleeve, or motrin for pain and inflammation control.  Be sure to call your surgeon tomorrow for follow up and to discuss your medication that went missing.  You may return to the Emergency Department at any time for worsening condition or any new symptoms that concern you. Return to the ED if you develop fever, inability to control your bowel or bladder function, or new neurologic symptoms.    Back Pain, Adult Back pain is very common in adults.The cause of back pain is rarely dangerous and the pain often gets better over time.The cause of your back pain may not be known. Some common causes of back pain include:  Strain of the muscles or ligaments supporting the spine.  Wear and tear (degeneration) of the spinal disks.  Arthritis.  Direct injury to the back. For many people, back pain may return. Since back pain is rarely dangerous, most people can learn to manage this condition on their own. HOME CARE INSTRUCTIONS Watch your back pain for any changes. The following actions may help to lessen any discomfort you are feeling:  Remain active. It is stressful on your back to sit or stand in one place for long periods of time. Do not sit, drive, or stand in one place for more than 30 minutes at a time. Take short walks on even surfaces as soon as you are able.Try to increase the length of time you walk each day.  Exercise regularly as directed by your health care provider. Exercise helps your back heal faster. It also helps avoid future injury by keeping your muscles strong and flexible.  Do not stay in bed.Resting more than 1-2 days can delay your recovery.  Pay attention to your body when you bend and lift. The most comfortable positions are  those that put less stress on your recovering back. Always use proper lifting techniques, including:  Bending your knees.  Keeping the load close to your body.  Avoiding twisting.  Find a comfortable position to sleep. Use a firm mattress and lie on your side with your knees slightly bent. If you lie on your back, put a pillow under your knees.  Avoid feeling anxious or stressed.Stress increases muscle tension and can worsen back pain.It is important to recognize when you are anxious or stressed and learn ways to manage it, such as with exercise.  Take medicines only as directed by your health care provider. Over-the-counter medicines to reduce pain and inflammation are often the most helpful.Your health care provider may prescribe muscle relaxant drugs.These medicines help dull your pain so you can more quickly return to your normal activities and healthy exercise.  Apply ice to the injured area:  Put ice in a plastic bag.  Place a towel between your skin and the bag.  Leave the ice on for 20 minutes, 2-3 times a day for the first 2-3 days. After that, ice and heat may be alternated to reduce pain and spasms.  Maintain a healthy weight. Excess weight puts extra stress on your back and makes it difficult to maintain good posture. SEEK MEDICAL CARE IF:  You have pain that is not relieved with rest or medicine.  You have increasing pain going down into the legs or buttocks.  You have pain that does  not improve in one week.  You have night pain.  You lose weight.  You have a fever or chills. SEEK IMMEDIATE MEDICAL CARE IF:   You develop new bowel or bladder control problems.  You have unusual weakness or numbness in your arms or legs.  You develop nausea or vomiting.  You develop abdominal pain.  You feel faint.   This information is not intended to replace advice given to you by your health care provider. Make sure you discuss any questions you have with your health  care provider.   Document Released: 03/12/2005 Document Revised: 04/02/2014 Document Reviewed: 07/14/2013 Elsevier Interactive Patient Education [employer].

## 2015-08-22 NOTE — ED Notes (Signed)
Per Pt, Pt is coming from home. Pt has L5 disc surgery on Monday. Pt reports good healing until yesterday when she started to have increased cramping pain in the right lower back. Reports, "I feel like my back is uneven when I sit." Vomiting, Nausea and diarrhea that started two days ago. Denies appetite. No new numbness or tingling noted to the legs. Continent of urine and bowel.
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
## 2015-08-22 NOTE — ED Notes (Signed)
Patient transported to X-ray 

## 2015-10-03 ENCOUNTER — Encounter (HOSPITAL_COMMUNITY): Payer: Self-pay | Admitting: *Deleted

## 2015-10-03 DIAGNOSIS — F17210 Nicotine dependence, cigarettes, uncomplicated: Secondary | ICD-10-CM | POA: Diagnosis not present

## 2015-10-03 DIAGNOSIS — B9689 Other specified bacterial agents as the cause of diseases classified elsewhere: Secondary | ICD-10-CM | POA: Diagnosis not present

## 2015-10-03 DIAGNOSIS — Z91040 Latex allergy status: Secondary | ICD-10-CM | POA: Insufficient documentation

## 2015-10-03 DIAGNOSIS — G8929 Other chronic pain: Secondary | ICD-10-CM | POA: Insufficient documentation

## 2015-10-03 DIAGNOSIS — J45909 Unspecified asthma, uncomplicated: Secondary | ICD-10-CM | POA: Diagnosis not present

## 2015-10-03 DIAGNOSIS — M542 Cervicalgia: Secondary | ICD-10-CM | POA: Insufficient documentation

## 2015-10-03 DIAGNOSIS — N898 Other specified noninflammatory disorders of vagina: Secondary | ICD-10-CM | POA: Diagnosis not present

## 2015-10-03 DIAGNOSIS — Z8541 Personal history of malignant neoplasm of cervix uteri: Secondary | ICD-10-CM | POA: Diagnosis not present

## 2015-10-03 DIAGNOSIS — Z79899 Other long term (current) drug therapy: Secondary | ICD-10-CM | POA: Insufficient documentation

## 2015-10-03 DIAGNOSIS — N189 Chronic kidney disease, unspecified: Secondary | ICD-10-CM | POA: Insufficient documentation

## 2015-10-03 LAB — COMPREHENSIVE METABOLIC PANEL
ALT: 17 U/L (ref 14–54)
AST: 18 U/L (ref 15–41)
Albumin: 3.7 g/dL (ref 3.5–5.0)
Alkaline Phosphatase: 83 U/L (ref 38–126)
Anion gap: 8 (ref 5–15)
BILIRUBIN TOTAL: 0.4 mg/dL (ref 0.3–1.2)
BUN: 11 mg/dL (ref 6–20)
CALCIUM: 8.9 mg/dL (ref 8.9–10.3)
CO2: 23 mmol/L (ref 22–32)
CREATININE: 0.97 mg/dL (ref 0.44–1.00)
Chloride: 108 mmol/L (ref 101–111)
GFR calc Af Amer: >60 mL/min (ref >60)
Glucose, Bld: 98 mg/dL (ref 65–99)
POTASSIUM: 3.1 mmol/L — AB (ref 3.5–5.1)
Sodium: 139 mmol/L (ref 135–145)
TOTAL PROTEIN: 7.2 g/dL (ref 6.5–8.1)

## 2015-10-03 LAB — URINALYSIS, ROUTINE W REFLEX MICROSCOPIC
Glucose, UA: NEGATIVE mg/dL
Hgb urine dipstick: NEGATIVE
KETONES UR: 15 mg/dL — AB
LEUKOCYTES UA: NEGATIVE
NITRITE: NEGATIVE
PROTEIN: NEGATIVE mg/dL
Specific Gravity, Urine: 1.03 (ref 1.005–1.030)
pH: 6 (ref 5.0–8.0)

## 2015-10-03 LAB — CBC
HEMATOCRIT: 36.1 % (ref 36.0–46.0)
Hemoglobin: 11.8 g/dL — ABNORMAL LOW (ref 12.0–15.0)
MCH: 26.5 pg (ref 26.0–34.0)
MCHC: 32.7 g/dL (ref 30.0–36.0)
MCV: 81.1 fL (ref 78.0–100.0)
PLATELETS: 287 10*3/uL (ref 150–400)
RBC: 4.45 MIL/uL (ref 3.87–5.11)
RDW: 16.3 % — AB (ref 11.5–15.5)
WBC: 11 10*3/uL — AB (ref 4.0–10.5)

## 2015-10-03 LAB — I-STAT BETA HCG BLOOD, ED (MC, WL, AP ONLY)

## 2015-10-03 LAB — LIPASE, BLOOD: Lipase: 32 U/L (ref 11–51)

## 2015-10-03 NOTE — ED Notes (Signed)
Pt in c/o neck pain after it popped a few days ago, also reports n/v/d and vaginal discharge for the last few days, no distress noted

## 2015-10-04 ENCOUNTER — Emergency Department (HOSPITAL_COMMUNITY)
Admission: EM | Admit: 2015-10-04 | Discharge: 2015-10-04 | Disposition: A | Discharge status: Home / Self Care | Payer: Medicaid Other | Attending: Emergency Medicine | Admitting: Emergency Medicine

## 2015-10-04 DIAGNOSIS — G8929 Other chronic pain: Secondary | ICD-10-CM

## 2015-10-04 DIAGNOSIS — M542 Cervicalgia: Secondary | ICD-10-CM

## 2015-10-04 DIAGNOSIS — B9689 Other specified bacterial agents as the cause of diseases classified elsewhere: Secondary | ICD-10-CM

## 2015-10-04 DIAGNOSIS — N760 Acute vaginitis: Secondary | ICD-10-CM

## 2015-10-04 LAB — GC/CHLAMYDIA PROBE AMP (~~LOC~~) NOT AT ARMC
CHLAMYDIA, DNA PROBE: NEGATIVE
Neisseria Gonorrhea: NEGATIVE

## 2015-10-04 LAB — WET PREP, GENITAL
Sperm: NONE SEEN
TRICH WET PREP: NONE SEEN
WBC WET PREP: NONE SEEN
Yeast Wet Prep HPF POC: NONE SEEN

## 2015-10-04 MED ORDER — POTASSIUM CHLORIDE CRYS ER 20 MEQ PO TBCR
40.0000 meq | EXTENDED_RELEASE_TABLET | Freq: Once | ORAL | Status: AC
  Administered 2015-10-04: 40 meq via ORAL
  Filled 2015-10-04: qty 2

## 2015-10-04 MED ORDER — METRONIDAZOLE 0.75 % VA GEL: 1.0000 | Freq: Two times a day (BID) | VAGINAL | Status: DC

## 2015-10-04 NOTE — ED Provider Notes (Signed)
CSN: [id number]     Arrival date & time 10/03/15  2154 History   First MD Initiated Contact with Patient 10/04/15 0215     Chief Complaint  Patient presents with  . Neck Pain     (Consider location/radiation/quality/duration/timing/severity/associated sxs/prior Treatment) HPI Comments: Patient presents with multiple complaints. She has a history of neck surgery and chronic neck pain that is treated routinely by Dr. Jimmye Norman. She reports she is being evaluated by Dr. Ellene Route for further cervical surgery. She also reports lower back pain with most recent laminectomy in May of this year. She felt a pop in her neck several days ago and reports the pain has been persistent and uncontrolled. She has chronic weakness in the left greater than right UE which also persists.   She also complains of nausea, vomiting, diarrhea and vaginal discharge. No fever. No hematemesis or hematochezia. She has been urinating without difficulty. No vaginal bleeding.  The history is provided by the patient. No language interpreter was used.    Past Medical History  Diagnosis Date  . Scoliosis   . Obesity   . Migraine   . Gallstones   . Asthma   . Arthritis   . Pelvic inflammatory disease (PID) 05-08-11    previous hx. .Hx. childbirth x3-NVD  . Leukocytosis     going to hematologist  . History of renal failure   . Edema   . History of anemia   . Cancer ([place])   . Cervical cancer ([place])   . Anxiety     Panic attack  . DDD (degenerative disc disease), cervical   . PTSD (post-traumatic stress disorder)   . GERD (gastroesophageal reflux disease)   . Constipation   . PID (pelvic inflammatory disease)   . HPV (human papilloma virus) anogenital infection   . Sciatica   . Bipolar 1 disorder ([place])   . Obesity   . Chronic kidney disease     history of kidney shut down  no current problems  . Anemia    Past Surgical History  Procedure Laterality Date  . Abdominal exploration surgery  approx 30 years old   rlq(due to adhesions around ovaries)-appendix and ovaries remain  . Cholecystectomy  05/10/2011    Procedure: LAPAROSCOPIC CHOLECYSTECTOMY WITH INTRAOPERATIVE CHOLANGIOGRAM;  Surgeon: Gayland Curry, MD,FACS;  Location: [facility];  Service: General;  Laterality: N/A;  . Anterior cervical decomp/discectomy fusion N/A 12/09/2012    Procedure: ANTERIOR CERVICAL DECOMPRESSION/DISCECTOMY FUSION STRUCTURAL ALLOGRAFT TRESTLE PLATE CERVICAL FIVE-SIX,SIX-SEVEN;  Surgeon: Otilio Connors, MD;  Location: [place] [facility];  Service: Neurosurgery;  Laterality: N/A;  . Lumbar laminectomy/decompression microdiscectomy Left 11/30/2014    Procedure: Left lumbar four-five Microdiskectomy;  Surgeon: Kristeen Miss, MD;  Location: [place] [facility];  Service: Neurosurgery;  Laterality: Left;  . Lumbar laminectomy/decompression microdiscectomy Left 08/15/2015    Procedure: Left Lumbar four-five Microdiskectomy;  Surgeon: Kristeen Miss, MD;  Location: [place] [facility];  Service: Neurosurgery;  Laterality: Left;   Family History  Problem Relation Age of Onset  . Prostate cancer Maternal Grandfather   . Cancer Maternal Grandfather     prostate  . Colon polyps Maternal Grandmother   . Diabetes Mother   . Endometriosis Mother   . Cancer Mother     lung  . Diabetes Father   . Heart disease Father   . Cirrhosis Paternal Grandfather    Social History  Substance Use Topics  . Smoking status: Current Every Day Smoker -- 0.50 packs/day for 12 years  Types: Cigarettes  . Smokeless tobacco: Never Used     Comment: trying to quit  . Alcohol Use: No   OB History    No data available     Review of Systems  Constitutional: Negative for fever and chills.  Respiratory: Negative.  Negative for cough and shortness of breath.   Cardiovascular: Negative.   Gastrointestinal: Positive for nausea, vomiting and diarrhea. Negative for blood in stool.  Genitourinary: Positive for vaginal discharge. Negative for dysuria.  Musculoskeletal: Positive  for back pain and neck pain.  Skin: Negative.   Neurological: Negative.       Allergies  Banana; Latex; Orange fruit; and Miconazole  Home Medications   Prior to Admission medications   Medication Sig Start Date End Date Taking? Authorizing Provider  albuterol (PROVENTIL HFA;VENTOLIN HFA) 108 (90 Base) MCG/ACT inhaler Inhale 1 puff into the lungs every 6 (six) hours as needed for wheezing or shortness of breath.    Historical Provider, MD  ALPRAZolam Duanne Moron) 1 MG tablet Take 1 mg by mouth 2 (two) times daily. Patient states she takes every day    Historical Provider, MD  ARIPiprazole (ABILIFY) 5 MG tablet Take 5 mg by mouth daily.    Historical Provider, MD  busPIRone (BUSPAR) 15 MG tablet Take 15 mg by mouth 2 (two) times daily.    Historical Provider, MD  cetirizine (ZYRTEC) 5 MG tablet Take 1 tablet (5 mg total) by mouth daily. Patient not taking: Reported on 08/22/2015 06/03/15   Veatrice Bourbon, MD  cyclobenzaprine (FLEXERIL) 5 MG tablet Take 2 tablets (10 mg total) by mouth 3 (three) times daily as needed for muscle spasms. 08/22/15   Roxanna Mew, PA-C  diclofenac (VOLTAREN) 75 MG EC tablet Take 75 mg by mouth 2 (two) times daily.    Historical Provider, MD  esomeprazole (NEXIUM) 40 MG capsule Take 40 mg by mouth daily.     Historical Provider, MD  FLUoxetine (PROZAC) 20 MG capsule Take 80 mg by mouth daily.     Historical Provider, MD  ondansetron (ZOFRAN) 4 MG tablet Take 1 tablet (4 mg total) by mouth every 8 (eight) hours as needed for nausea or vomiting. 06/03/15   Veatrice Bourbon, MD  oxyCODONE-acetaminophen (PERCOCET/ROXICET) 5-325 MG tablet Take 1-2 tablets by mouth every 4 (four) hours as needed for moderate pain. 06/10/15   Margarita Mail, PA-C  prazosin (MINIPRESS) 2 MG capsule Take 2 mg by mouth at bedtime.     Historical Provider, MD  traZODone (DESYREL) 50 MG tablet Take 100 mg by mouth at bedtime.     Historical Provider, MD  zolpidem (AMBIEN) 10 MG tablet Take 10 mg  by mouth at bedtime as needed for sleep.    Historical Provider, MD   BP 118/73 mmHg  Pulse 84  Temp(Src) 98.3 F (36.8 C) (Oral)  Resp 19  Ht 5\' 3"  (1.6 m)  Wt 105.235 kg  BMI 41.11 kg/m2  SpO2 96%  LMP 08/07/2015 Physical Exam  Constitutional: She appears well-developed and well-nourished.  HENT:  Head: Normocephalic.  Neck: Normal range of motion. Neck supple.  Cardiovascular: Normal rate and regular rhythm.   Pulmonary/Chest: Effort normal and breath sounds normal.  Abdominal: Soft. Bowel sounds are normal. There is no tenderness. There is no rebound and no guarding.  Genitourinary:  White vaginal discharge present. Nontender cervix. No adnexal mass or tenderness.   Musculoskeletal: Normal range of motion.  There is midline and bilateral paraspinal tenderness of the cervical and  lumbar spine. No swelling.  Neurological: She is alert. No cranial nerve deficit.  Reflexes and equal in bilateral upper and lower extremities. There is a decreased grip strength on the right. CN's 3-12 grossly intact. No deficits of coordination. Speech clear and focused.   Skin: Skin is warm and dry. No rash noted.  Psychiatric: She has a normal mood and affect.    ED Course  Procedures (including critical care time) Labs Review Labs Reviewed  COMPREHENSIVE METABOLIC PANEL - Abnormal; Notable for the following:    Potassium 3.1 (*)    All other components within normal limits  CBC - Abnormal; Notable for the following:    WBC 11.0 (*)    Hemoglobin 11.8 (*)    RDW 16.3 (*)    All other components within normal limits  URINALYSIS, ROUTINE W REFLEX MICROSCOPIC (NOT AT [facility]) - Abnormal; Notable for the following:    Color, Urine AMBER (*)    Bilirubin Urine SMALL (*)    Ketones, ur 15 (*)    All other components within normal limits  WET PREP, GENITAL  LIPASE, BLOOD  I-STAT BETA HCG BLOOD, ED ([facility], [facility], [facility] ONLY)  GC/CHLAMYDIA PROBE AMP ([place]) NOT AT [facility]    Imaging Review No  results found. I have personally reviewed and evaluated these images and lab results as part of my medical decision-making.   EKG Interpretation None      MDM   Final diagnoses:  None    1. Chronic neck and back pain 2. BV  [place] Controlled substance website consulted. The patient is getting regular pain control from Dr. Jimmye Norman on a monthly basis. She is also getting additional pain medication from Dr. Ellene Route. No further pain medications to be prescribed from the ED. The patient agrees to this plan  Metrogel provided for BV. Encouraged PCP follow up .    Charlann Lange, PA-C 10/05/15 [id number]  Everlene Balls, MD 10/06/15 2310

## 2015-10-04 NOTE — Discharge Instructions (Signed)

## 2015-12-29 ENCOUNTER — Emergency Department (HOSPITAL_COMMUNITY)
Admission: EM | Admit: 2015-12-29 | Discharge: 2015-12-29 | Disposition: A | Discharge status: Home / Self Care | Payer: Medicaid Other | Attending: Emergency Medicine | Admitting: Emergency Medicine

## 2015-12-29 ENCOUNTER — Encounter (HOSPITAL_COMMUNITY): Payer: Self-pay | Admitting: Emergency Medicine

## 2015-12-29 DIAGNOSIS — J45909 Unspecified asthma, uncomplicated: Secondary | ICD-10-CM | POA: Diagnosis not present

## 2015-12-29 DIAGNOSIS — N3000 Acute cystitis without hematuria: Secondary | ICD-10-CM | POA: Diagnosis not present

## 2015-12-29 DIAGNOSIS — Z8541 Personal history of malignant neoplasm of cervix uteri: Secondary | ICD-10-CM | POA: Diagnosis not present

## 2015-12-29 DIAGNOSIS — R300 Dysuria: Secondary | ICD-10-CM | POA: Diagnosis present

## 2015-12-29 DIAGNOSIS — N189 Chronic kidney disease, unspecified: Secondary | ICD-10-CM | POA: Diagnosis not present

## 2015-12-29 DIAGNOSIS — F17210 Nicotine dependence, cigarettes, uncomplicated: Secondary | ICD-10-CM | POA: Insufficient documentation

## 2015-12-29 DIAGNOSIS — Z91040 Latex allergy status: Secondary | ICD-10-CM | POA: Insufficient documentation

## 2015-12-29 LAB — URINALYSIS, ROUTINE W REFLEX MICROSCOPIC
BILIRUBIN URINE: NEGATIVE
GLUCOSE, UA: NEGATIVE mg/dL
HGB URINE DIPSTICK: NEGATIVE
KETONES UR: NEGATIVE mg/dL
Nitrite: NEGATIVE
PH: 6 (ref 5.0–8.0)
PROTEIN: 30 mg/dL — AB
Specific Gravity, Urine: 1.028 (ref 1.005–1.030)

## 2015-12-29 LAB — URINE MICROSCOPIC-ADD ON

## 2015-12-29 LAB — POC URINE PREG, ED: Preg Test, Ur: NEGATIVE

## 2015-12-29 MED ORDER — PHENAZOPYRIDINE HCL 200 MG PO TABS: 200.0000 mg | ORAL_TABLET | Freq: Three times a day (TID) | ORAL | 0 refills | Status: DC

## 2015-12-29 MED ORDER — CEPHALEXIN 500 MG PO CAPS: 500.0000 mg | ORAL_CAPSULE | Freq: Four times a day (QID) | ORAL | 0 refills | Status: DC

## 2015-12-29 NOTE — ED Provider Notes (Addendum)
[facility] DEPT Provider Note   CSN: [id number] Arrival date & time: 12/29/15  0818     History   Chief Complaint No chief complaint on file.   HPI [name] is a 30 y.o. female.  Patient is a 30 year old female with past medical history of anemia and anxiety. She presents with cough and congestion as well as urinary frequency and burning. No fevers or chills. No chest pain or sob.   The history is provided by the patient.  Dysuria   This is a new problem. The current episode started 2 days ago. The problem occurs every urination. The problem has been gradually worsening. The quality of the pain is described as burning. The pain is moderate. There has been no fever. Pertinent negatives include no chills, no vomiting and no discharge. She has tried nothing for the symptoms.    Past Medical History:  Diagnosis Date  . Anemia   . Anxiety    Panic attack  . Arthritis   . Asthma   . Bipolar 1 disorder ([place])   . Cancer ([place])   . Cervical cancer ([place])   . Chronic kidney disease    history of kidney shut down  no current problems  . Constipation   . DDD (degenerative disc disease), cervical   . Edema   . Gallstones   . GERD (gastroesophageal reflux disease)   . History of anemia   . History of renal failure   . HPV (human papilloma virus) anogenital infection   . Leukocytosis    going to hematologist  . Migraine   . Obesity   . Obesity   . Pelvic inflammatory disease (PID) 05-08-11   previous hx. .Hx. childbirth x3-NVD  . PID (pelvic inflammatory disease)   . PTSD (post-traumatic stress disorder)   . Sciatica   . Scoliosis     Patient Active Problem List   Diagnosis Date Noted  . Herniated nucleus pulposus, L4-5 left 11/30/2014  . Tobacco abuse 12/31/2012  . Leukocytosis, unspecified 12/31/2012  . Anemia, unspecified 12/31/2012  . Numbness of arm 11/14/2012  . Chronic pain syndrome 10/28/2012  . Neck pain 10/28/2012  . Back pain 10/28/2012  .  Osteoarthritis 10/28/2012  . Obesity   . Migraine   . Asthma     Past Surgical History:  Procedure Laterality Date  . ABDOMINAL EXPLORATION SURGERY  approx 30 years old   rlq(due to adhesions around ovaries)-appendix and ovaries remain  . ANTERIOR CERVICAL DECOMP/DISCECTOMY FUSION N/A 12/09/2012   Procedure: ANTERIOR CERVICAL DECOMPRESSION/DISCECTOMY FUSION STRUCTURAL ALLOGRAFT TRESTLE PLATE CERVICAL FIVE-SIX,SIX-SEVEN;  Surgeon: Otilio Connors, MD;  Location: [facility];  Service: Neurosurgery;  Laterality: N/A;  . CHOLECYSTECTOMY  05/10/2011   Procedure: LAPAROSCOPIC CHOLECYSTECTOMY WITH INTRAOPERATIVE CHOLANGIOGRAM;  Surgeon: Gayland Curry, MD,FACS;  Location: [facility];  Service: General;  Laterality: N/A;  . LUMBAR LAMINECTOMY/DECOMPRESSION MICRODISCECTOMY Left 11/30/2014   Procedure: Left lumbar four-five Microdiskectomy;  Surgeon: Kristeen Miss, MD;  Location: [facility];  Service: Neurosurgery;  Laterality: Left;  . LUMBAR LAMINECTOMY/DECOMPRESSION MICRODISCECTOMY Left 08/15/2015   Procedure: Left Lumbar four-five Microdiskectomy;  Surgeon: Kristeen Miss, MD;  Location: [facility];  Service: Neurosurgery;  Laterality: Left;    OB History    No data available       Home Medications    Prior to Admission medications   Medication Sig Start Date End Date Taking? Authorizing Provider  albuterol (PROVENTIL HFA;VENTOLIN HFA) 108 (90 Base) MCG/ACT inhaler Inhale 1 puff into the lungs  every 6 (six) hours as needed for wheezing or shortness of breath.    Historical Provider, MD  ALPRAZolam Duanne Moron) 1 MG tablet Take 1 mg by mouth 2 (two) times daily. Patient states she takes every day    Historical Provider, MD  ARIPiprazole (ABILIFY) 5 MG tablet Take 5 mg by mouth daily.    Historical Provider, MD  busPIRone (BUSPAR) 15 MG tablet Take 15 mg by mouth 2 (two) times daily.    Historical Provider, MD  cetirizine (ZYRTEC) 5 MG tablet Take 1 tablet (5 mg total) by mouth daily. Patient not  taking: Reported on 08/22/2015 06/03/15   Veatrice Bourbon, MD  cyclobenzaprine (FLEXERIL) 5 MG tablet Take 2 tablets (10 mg total) by mouth 3 (three) times daily as needed for muscle spasms. 08/22/15   Roxanna Mew, PA-C  diclofenac (VOLTAREN) 75 MG EC tablet Take 75 mg by mouth 2 (two) times daily.    Historical Provider, MD  esomeprazole (NEXIUM) 40 MG capsule Take 40 mg by mouth daily.     Historical Provider, MD  FLUoxetine (PROZAC) 20 MG capsule Take 80 mg by mouth daily.     Historical Provider, MD  metroNIDAZOLE (METROGEL VAGINAL) 0.75 % vaginal gel Place 1 Applicatorful vaginally 2 (two) times daily. 10/04/15   Charlann Lange, PA-C  ondansetron (ZOFRAN) 4 MG tablet Take 1 tablet (4 mg total) by mouth every 8 (eight) hours as needed for nausea or vomiting. 06/03/15   Veatrice Bourbon, MD  oxyCODONE-acetaminophen (PERCOCET/ROXICET) 5-325 MG tablet Take 1-2 tablets by mouth every 4 (four) hours as needed for moderate pain. 06/10/15   Margarita Mail, PA-C  prazosin (MINIPRESS) 2 MG capsule Take 2 mg by mouth at bedtime.     Historical Provider, MD  traZODone (DESYREL) 50 MG tablet Take 100 mg by mouth at bedtime.     Historical Provider, MD  zolpidem (AMBIEN) 10 MG tablet Take 10 mg by mouth at bedtime as needed for sleep.    Historical Provider, MD    Family History Family History  Problem Relation Age of Onset  . Diabetes Mother   . Endometriosis Mother   . Cancer Mother     lung  . Diabetes Father   . Heart disease Father   . Prostate cancer Maternal Grandfather   . Cancer Maternal Grandfather     prostate  . Colon polyps Maternal Grandmother   . Cirrhosis Paternal Grandfather     Social History Social History  Substance Use Topics  . Smoking status: Current Every Day Smoker    Packs/day: 0.50    Years: 12.00    Types: Cigarettes  . Smokeless tobacco: Never Used     Comment: trying to quit  . Alcohol use No     Allergies   Banana; Latex; Orange fruit [citrus]; and  Miconazole   Review of Systems Review of Systems  Constitutional: Negative for chills.  Gastrointestinal: Negative for vomiting.  Genitourinary: Positive for dysuria.  All other systems reviewed and are negative.    Physical Exam Updated Vital Signs BP 130/86 (BP Location: Right Arm)   Pulse 71   Temp 98 F (36.7 C) (Oral)   Resp 20   LMP 12/10/2015 (Approximate)   SpO2 100%   Physical Exam  Constitutional: She is oriented to person, place, and time. She appears well-developed and well-nourished. No distress.  HENT:  Head: Normocephalic and atraumatic.  Neck: Normal range of motion. Neck supple.  Cardiovascular: Normal rate and regular rhythm.  Exam reveals no gallop and no friction rub.   No murmur heard. Pulmonary/Chest: Effort normal and breath sounds normal. No respiratory distress. She has no wheezes.  Abdominal: Soft. Bowel sounds are normal. She exhibits no distension. There is no tenderness.  Musculoskeletal: Normal range of motion.  Neurological: She is alert and oriented to person, place, and time.  Skin: Skin is warm and dry. She is not diaphoretic.  Nursing note and vitals reviewed.    ED Treatments / Results  Labs (all labs ordered are listed, but only abnormal results are displayed) Labs Reviewed  URINALYSIS, ROUTINE W REFLEX MICROSCOPIC (NOT AT [facility])  POC URINE PREG, ED    EKG  EKG Interpretation None       Radiology No results found.  Procedures Procedures (including critical care time)  Medications Ordered in ED Medications - No data to display   Initial Impression / Assessment and Plan / ED Course  I have reviewed the triage vital signs and the nursing notes.  Pertinent labs & imaging results that were available during my care of the patient were reviewed by me and considered in my medical decision making (see chart for details).  Clinical Course    Patient presents with upper respiratory symptoms and burning with urination. She  recently had a pelvic exam at an outside facility and was treated for a yeast infection. Her discharge has improved, however she continues with dysuria. Today's urinalysis reveals too numerous to count white cells. This will be treated with Keflex. I suspect her respiratory symptoms are viral, however if there is a component of bacterial bronchitis, this antibiotic should help with this as well. She is to follow-up with her primary Dr. if her symptoms persist.  Final Clinical Impressions(s) / ED Diagnoses   Final diagnoses:  None    New Prescriptions New Prescriptions   No medications on file     Veryl Speak, MD 12/29/15 Quinn, MD 01/26/16 Mack, MD 02/06/16 [phone]

## 2015-12-29 NOTE — Discharge Instructions (Signed)
Keflex as prescribed.  Pyridium as prescribed.  Follow-up with your primary Dr. if your symptoms are not improving in the next week.

## 2015-12-29 NOTE — ED Triage Notes (Signed)
Pt presents with 2 week h/o cough and nasal congestion and sore throat; reports 3 week h/o dysuria - had pap smear and urine tested with negative results; reports symptoms have worsened to the point that she has had vaginal pain; denies any vaginal discharge.

## 2016-01-02 ENCOUNTER — Emergency Department (HOSPITAL_COMMUNITY)
Admission: EM | Admit: 2016-01-02 | Discharge: 2016-01-02 | Disposition: A | Discharge status: Home / Self Care | Payer: Medicaid Other | Attending: Emergency Medicine | Admitting: Emergency Medicine

## 2016-01-02 ENCOUNTER — Encounter (HOSPITAL_COMMUNITY): Payer: Self-pay | Admitting: *Deleted

## 2016-01-02 DIAGNOSIS — J45909 Unspecified asthma, uncomplicated: Secondary | ICD-10-CM | POA: Insufficient documentation

## 2016-01-02 DIAGNOSIS — Z91040 Latex allergy status: Secondary | ICD-10-CM | POA: Diagnosis not present

## 2016-01-02 DIAGNOSIS — Z8541 Personal history of malignant neoplasm of cervix uteri: Secondary | ICD-10-CM | POA: Insufficient documentation

## 2016-01-02 DIAGNOSIS — N189 Chronic kidney disease, unspecified: Secondary | ICD-10-CM | POA: Insufficient documentation

## 2016-01-02 DIAGNOSIS — F17210 Nicotine dependence, cigarettes, uncomplicated: Secondary | ICD-10-CM | POA: Diagnosis not present

## 2016-01-02 DIAGNOSIS — N760 Acute vaginitis: Secondary | ICD-10-CM | POA: Insufficient documentation

## 2016-01-02 DIAGNOSIS — N898 Other specified noninflammatory disorders of vagina: Secondary | ICD-10-CM | POA: Diagnosis present

## 2016-01-02 LAB — URINALYSIS, ROUTINE W REFLEX MICROSCOPIC
BILIRUBIN URINE: NEGATIVE
GLUCOSE, UA: NEGATIVE mg/dL
HGB URINE DIPSTICK: NEGATIVE
KETONES UR: 15 mg/dL — AB
Nitrite: NEGATIVE
PH: 7 (ref 5.0–8.0)
Protein, ur: NEGATIVE mg/dL
Specific Gravity, Urine: 1.025 (ref 1.005–1.030)

## 2016-01-02 LAB — WET PREP, GENITAL
CLUE CELLS WET PREP: NONE SEEN
TRICH WET PREP: NONE SEEN
YEAST WET PREP: NONE SEEN

## 2016-01-02 LAB — URINE MICROSCOPIC-ADD ON

## 2016-01-02 LAB — PREGNANCY, URINE: Preg Test, Ur: NEGATIVE

## 2016-01-02 MED ORDER — DOXYCYCLINE HYCLATE 100 MG PO CAPS: 100.0000 mg | ORAL_CAPSULE | Freq: Two times a day (BID) | ORAL | 0 refills | Status: DC

## 2016-01-02 MED ORDER — CEFTRIAXONE SODIUM 250 MG IJ SOLR
250.0000 mg | Freq: Once | INTRAMUSCULAR | Status: AC
  Administered 2016-01-02: 250 mg via INTRAMUSCULAR
  Filled 2016-01-02: qty 250

## 2016-01-02 MED ORDER — LIDOCAINE HCL (PF) 1 % IJ SOLN
INTRAMUSCULAR | Status: AC
  Administered 2016-01-02: 5 mL
  Filled 2016-01-02: qty 5

## 2016-01-02 NOTE — ED Provider Notes (Signed)
[facility] DEPT Provider Note   CSN: [id number] Arrival date & time: 01/02/16  [id number]     History   Chief Complaint Chief Complaint  Patient presents with  . Vaginal Itching    HPI [name] is a 30 y.o. female.  HPI Patient presents with vaginal itching starting 2 days ago. States she was recently diagnosed with UTI and given antibiotics. She states she frequently gets yeast infections after taking antibiotics. She states her symptoms are exactly similar to previous yeast infections. Denies any discharge. No pelvic or abdominal pain. No fever or chills. States she is taking Diflucan in the past for her yeast infections and is requesting this medication. She has an OB/GYN and with recently evaluated and had Pap smear. Past Medical History:  Diagnosis Date  . Anemia   . Anxiety    Panic attack  . Arthritis   . Asthma   . Bipolar 1 disorder ([place])   . Cancer ([place])   . Cervical cancer ([place])   . Chronic kidney disease    history of kidney shut down  no current problems  . Constipation   . DDD (degenerative disc disease), cervical   . Edema   . Gallstones   . GERD (gastroesophageal reflux disease)   . History of anemia   . History of renal failure   . HPV (human papilloma virus) anogenital infection   . Leukocytosis    going to hematologist  . Migraine   . Obesity   . Obesity   . Pelvic inflammatory disease (PID) 05-08-11   previous hx. .Hx. childbirth x3-NVD  . PID (pelvic inflammatory disease)   . PTSD (post-traumatic stress disorder)   . Sciatica   . Scoliosis     Patient Active Problem List   Diagnosis Date Noted  . Herniated nucleus pulposus, L4-5 left 11/30/2014  . Tobacco abuse 12/31/2012  . Leukocytosis, unspecified 12/31/2012  . Anemia, unspecified 12/31/2012  . Numbness of arm 11/14/2012  . Chronic pain syndrome 10/28/2012  . Neck pain 10/28/2012  . Back pain 10/28/2012  . Osteoarthritis 10/28/2012  . Obesity   . Migraine   . Asthma      Past Surgical History:  Procedure Laterality Date  . ABDOMINAL EXPLORATION SURGERY  approx 30 years old   rlq(due to adhesions around ovaries)-appendix and ovaries remain  . ANTERIOR CERVICAL DECOMP/DISCECTOMY FUSION N/A 12/09/2012   Procedure: ANTERIOR CERVICAL DECOMPRESSION/DISCECTOMY FUSION STRUCTURAL ALLOGRAFT TRESTLE PLATE CERVICAL FIVE-SIX,SIX-SEVEN;  Surgeon: Otilio Connors, MD;  Location: [facility];  Service: Neurosurgery;  Laterality: N/A;  . CHOLECYSTECTOMY  05/10/2011   Procedure: LAPAROSCOPIC CHOLECYSTECTOMY WITH INTRAOPERATIVE CHOLANGIOGRAM;  Surgeon: Gayland Curry, MD,FACS;  Location: [facility];  Service: General;  Laterality: N/A;  . LUMBAR LAMINECTOMY/DECOMPRESSION MICRODISCECTOMY Left 11/30/2014   Procedure: Left lumbar four-five Microdiskectomy;  Surgeon: Kristeen Miss, MD;  Location: [facility];  Service: Neurosurgery;  Laterality: Left;  . LUMBAR LAMINECTOMY/DECOMPRESSION MICRODISCECTOMY Left 08/15/2015   Procedure: Left Lumbar four-five Microdiskectomy;  Surgeon: Kristeen Miss, MD;  Location: [facility];  Service: Neurosurgery;  Laterality: Left;    OB History    No data available       Home Medications    Prior to Admission medications   Medication Sig Start Date End Date Taking? Authorizing Provider  albuterol (PROVENTIL HFA;VENTOLIN HFA) 108 (90 Base) MCG/ACT inhaler Inhale 1 puff into the lungs every 6 (six) hours as needed for wheezing or shortness of breath.    Historical Provider, MD  ALPRAZolam Duanne Moron)  1 MG tablet Take 1 mg by mouth 3 (three) times daily as needed for anxiety. Patient states she takes every day    Historical Provider, MD  ARIPiprazole (ABILIFY) 5 MG tablet Take 5 mg by mouth daily.    Historical Provider, MD  busPIRone (BUSPAR) 15 MG tablet Take 15 mg by mouth 2 (two) times daily.    Historical Provider, MD  cephALEXin (KEFLEX) 500 MG capsule Take 1 capsule (500 mg total) by mouth 4 (four) times daily. 12/29/15   Veryl Speak, MD   cetirizine (ZYRTEC) 5 MG tablet Take 1 tablet (5 mg total) by mouth daily. Patient not taking: Reported on 08/22/2015 06/03/15   Veatrice Bourbon, MD  cyclobenzaprine (FLEXERIL) 5 MG tablet Take 2 tablets (10 mg total) by mouth 3 (three) times daily as needed for muscle spasms. 08/22/15   Roxanna Mew, PA-C  diclofenac (VOLTAREN) 75 MG EC tablet Take 75 mg by mouth 2 (two) times daily.    Historical Provider, MD  diphenhydrAMINE (BENADRYL) 25 MG tablet Take 25 mg by mouth every 6 (six) hours as needed for itching.    Historical Provider, MD  doxycycline (VIBRAMYCIN) 100 MG capsule Take 1 capsule (100 mg total) by mouth 2 (two) times daily. One po bid x 7 days 01/02/16   Julianne Rice, MD  esomeprazole (NEXIUM) 40 MG capsule Take 40 mg by mouth daily.     Historical Provider, MD  FLUoxetine (PROZAC) 20 MG capsule Take 80 mg by mouth daily.     Historical Provider, MD  metroNIDAZOLE (METROGEL VAGINAL) 0.75 % vaginal gel Place 1 Applicatorful vaginally 2 (two) times daily. 10/04/15   Charlann Lange, PA-C  ondansetron (ZOFRAN) 4 MG tablet Take 1 tablet (4 mg total) by mouth every 8 (eight) hours as needed for nausea or vomiting. 06/03/15   Veatrice Bourbon, MD  oxyCODONE-acetaminophen (PERCOCET/ROXICET) 5-325 MG tablet Take 1-2 tablets by mouth every 4 (four) hours as needed for moderate pain. 06/10/15   Margarita Mail, PA-C  phenazopyridine (PYRIDIUM) 200 MG tablet Take 1 tablet (200 mg total) by mouth 3 (three) times daily. 12/29/15   Veryl Speak, MD  prazosin (MINIPRESS) 2 MG capsule Take 2 mg by mouth at bedtime.     Historical Provider, MD  traZODone (DESYREL) 50 MG tablet Take 100 mg by mouth at bedtime.     Historical Provider, MD  zolpidem (AMBIEN) 10 MG tablet Take 10 mg by mouth at bedtime as needed for sleep.    Historical Provider, MD    Family History Family History  Problem Relation Age of Onset  . Diabetes Mother   . Endometriosis Mother   . Cancer Mother     lung  . Diabetes Father    . Heart disease Father   . Prostate cancer Maternal Grandfather   . Cancer Maternal Grandfather     prostate  . Colon polyps Maternal Grandmother   . Cirrhosis Paternal Grandfather     Social History Social History  Substance Use Topics  . Smoking status: Current Every Day Smoker    Packs/day: 0.50    Years: 12.00    Types: Cigarettes  . Smokeless tobacco: Never Used     Comment: trying to quit  . Alcohol use No     Allergies   Banana; Latex; Orange fruit [citrus]; and Miconazole   Review of Systems Review of Systems  Constitutional: Negative for chills and fever.  Gastrointestinal: Negative for abdominal pain, nausea and vomiting.  Genitourinary: Negative for  dysuria, flank pain, frequency, pelvic pain, vaginal bleeding, vaginal discharge and vaginal pain.  Skin: Negative for rash.  All other systems reviewed and are negative.    Physical Exam Updated Vital Signs BP 131/71   Pulse 77   Temp 97.9 F (36.6 C) (Oral)   Resp 20   Ht 5\' 3"  (1.6 m)   Wt 220 lb (99.8 kg)   LMP 12/10/2015 (Approximate)   SpO2 97%   BMI 38.97 kg/m   Physical Exam  Constitutional: She is oriented to person, place, and time. She appears well-developed and well-nourished.  HENT:  Head: Normocephalic and atraumatic.  Mouth/Throat: Oropharynx is clear and moist.  Eyes: EOM are normal. Pupils are equal, round, and reactive to light.  Neck: Normal range of motion. Neck supple.  Cardiovascular: Normal rate and regular rhythm.   Pulmonary/Chest: Effort normal and breath sounds normal.  Abdominal: Soft. Bowel sounds are normal. There is no tenderness. There is no rebound and no guarding.  No abdominal tenderness  Genitourinary: Vaginal discharge found.  Genitourinary Comments: Thick white vaginal discharge. No cervical motion tenderness. Mild suprapubic tenderness to palpation.  Musculoskeletal: Normal range of motion. She exhibits no edema or tenderness.  No CVA tenderness.   Neurological: She is alert and oriented to person, place, and time.  Skin: Skin is warm and dry. No rash noted. No erythema.  Psychiatric: She has a normal mood and affect. Her behavior is normal.  Nursing note and vitals reviewed.    ED Treatments / Results  Labs (all labs ordered are listed, but only abnormal results are displayed) Labs Reviewed  WET PREP, GENITAL - Abnormal; Notable for the following:       Result Value   WBC, Wet Prep HPF POC MANY (*)    All other components within normal limits  URINALYSIS, ROUTINE W REFLEX MICROSCOPIC (NOT AT [facility]) - Abnormal; Notable for the following:    Color, Urine AMBER (*)    APPearance HAZY (*)    Ketones, ur 15 (*)    Leukocytes, UA MODERATE (*)    All other components within normal limits  URINE MICROSCOPIC-ADD ON - Abnormal; Notable for the following:    Squamous Epithelial / LPF 6-30 (*)    Bacteria, UA MANY (*)    All other components within normal limits  URINE CULTURE  PREGNANCY, URINE  GC/CHLAMYDIA PROBE AMP ([place]) NOT AT [facility]    EKG  EKG Interpretation None       Radiology No results found.  Procedures Procedures (including critical care time)  Medications Ordered in ED Medications  cefTRIAXone (ROCEPHIN) injection 250 mg (250 mg Intramuscular Given 01/02/16 1319)  lidocaine (PF) (XYLOCAINE) 1 % injection (5 mLs  Given 01/02/16 1320)     Initial Impression / Assessment and Plan / ED Course  I have reviewed the triage vital signs and the nursing notes.  Pertinent labs & imaging results that were available during my care of the patient were reviewed by me and considered in my medical decision making (see chart for details).  Clinical Course    We'll check urine to assure resolution of UTI. Patient Initially deferred pelvic exam. Urine still has evidence of infection. We'll send off for culture and perform pelvic exam with wet prep.  Wet prep without evidence of yeast. Many white blood cells. We'll  treat for vaginitis/cervicitis. Patient's advised to continue antibiotics for UTI though this likely is a vaginal infection. Advised to follow-up with health department to assure resolution of  symptoms. Return precautions given. Final Clinical Impressions(s) / ED Diagnoses   Final diagnoses:  Acute vaginitis    New Prescriptions New Prescriptions   DOXYCYCLINE (VIBRAMYCIN) 100 MG CAPSULE    Take 1 capsule (100 mg total) by mouth 2 (two) times daily. One po bid x 7 days     Julianne Rice, MD 01/02/16 1348

## 2016-01-02 NOTE — ED Notes (Signed)
Placed the patient in a gown and on the monitor

## 2016-01-02 NOTE — ED Notes (Signed)
Papers reviewed and patient verbalizes understanding and intent to follow up as prescribed.

## 2016-01-02 NOTE — ED Notes (Signed)
Gave patient a [place] sandwich per Dr Mardelle Matte

## 2016-01-02 NOTE — ED Triage Notes (Signed)
Pt states she was tx for UTI several days ago.  States has taken antibiotics and symptoms have resolved, but now has vaginal itching.

## 2016-01-03 LAB — GC/CHLAMYDIA PROBE AMP (~~LOC~~) NOT AT ARMC
Chlamydia: NEGATIVE
NEISSERIA GONORRHEA: NEGATIVE

## 2016-01-04 LAB — URINE CULTURE: Culture: 30000 — AB

## 2016-01-05 ENCOUNTER — Emergency Department (HOSPITAL_COMMUNITY)
Admission: EM | Admit: 2016-01-05 | Discharge: 2016-01-05 | Disposition: A | Discharge status: Home / Self Care | Payer: Medicaid Other | Attending: Emergency Medicine | Admitting: Emergency Medicine

## 2016-01-05 ENCOUNTER — Telehealth (HOSPITAL_BASED_OUTPATIENT_CLINIC_OR_DEPARTMENT_OTHER): Payer: Self-pay | Admitting: Emergency Medicine

## 2016-01-05 ENCOUNTER — Encounter (HOSPITAL_COMMUNITY): Payer: Self-pay

## 2016-01-05 DIAGNOSIS — J45909 Unspecified asthma, uncomplicated: Secondary | ICD-10-CM | POA: Diagnosis not present

## 2016-01-05 DIAGNOSIS — Z8541 Personal history of malignant neoplasm of cervix uteri: Secondary | ICD-10-CM | POA: Diagnosis not present

## 2016-01-05 DIAGNOSIS — N898 Other specified noninflammatory disorders of vagina: Secondary | ICD-10-CM

## 2016-01-05 DIAGNOSIS — R102 Pelvic and perineal pain: Secondary | ICD-10-CM | POA: Diagnosis present

## 2016-01-05 DIAGNOSIS — N189 Chronic kidney disease, unspecified: Secondary | ICD-10-CM | POA: Diagnosis not present

## 2016-01-05 DIAGNOSIS — N3000 Acute cystitis without hematuria: Secondary | ICD-10-CM | POA: Diagnosis not present

## 2016-01-05 DIAGNOSIS — F17210 Nicotine dependence, cigarettes, uncomplicated: Secondary | ICD-10-CM | POA: Diagnosis not present

## 2016-01-05 DIAGNOSIS — Z91040 Latex allergy status: Secondary | ICD-10-CM | POA: Insufficient documentation

## 2016-01-05 LAB — CBC WITH DIFFERENTIAL/PLATELET
Basophils Absolute: 0 10*3/uL (ref 0.0–0.1)
Basophils Relative: 0 %
Eosinophils Absolute: 0.2 10*3/uL (ref 0.0–0.7)
Eosinophils Relative: 2 %
HCT: 36.7 % (ref 36.0–46.0)
Hemoglobin: 12 g/dL (ref 12.0–15.0)
Lymphocytes Relative: 38 %
Lymphs Abs: 3.6 10*3/uL (ref 0.7–4.0)
MCH: 25.8 pg — ABNORMAL LOW (ref 26.0–34.0)
MCHC: 32.7 g/dL (ref 30.0–36.0)
MCV: 78.8 fL (ref 78.0–100.0)
Monocytes Absolute: 0.8 10*3/uL (ref 0.1–1.0)
Monocytes Relative: 8 %
Neutro Abs: 4.9 10*3/uL (ref 1.7–7.7)
Neutrophils Relative %: 52 %
Platelets: 266 10*3/uL (ref 150–400)
RBC: 4.66 MIL/uL (ref 3.87–5.11)
RDW: 17.1 % — ABNORMAL HIGH (ref 11.5–15.5)
WBC: 9.6 10*3/uL (ref 4.0–10.5)

## 2016-01-05 LAB — URINALYSIS, ROUTINE W REFLEX MICROSCOPIC
Bilirubin Urine: NEGATIVE
Glucose, UA: NEGATIVE mg/dL
Hgb urine dipstick: NEGATIVE
Ketones, ur: NEGATIVE mg/dL
Leukocytes, UA: NEGATIVE
Nitrite: NEGATIVE
Protein, ur: NEGATIVE mg/dL
Specific Gravity, Urine: 1.015 (ref 1.005–1.030)
pH: 6 (ref 5.0–8.0)

## 2016-01-05 LAB — BASIC METABOLIC PANEL
Anion gap: 7 (ref 5–15)
BUN: 11 mg/dL (ref 6–20)
CO2: 22 mmol/L (ref 22–32)
Calcium: 9.1 mg/dL (ref 8.9–10.3)
Chloride: 107 mmol/L (ref 101–111)
Creatinine, Ser: 0.87 mg/dL (ref 0.44–1.00)
GFR calc Af Amer: >60 mL/min (ref >60)
GFR calc non Af Amer: >60 mL/min (ref >60)
Glucose, Bld: 84 mg/dL (ref 65–99)
Potassium: 4.1 mmol/L (ref 3.5–5.1)
Sodium: 136 mmol/L (ref 135–145)

## 2016-01-05 MED ORDER — CEPHALEXIN 500 MG PO CAPS: 1000.0000 mg | ORAL_CAPSULE | Freq: Two times a day (BID) | ORAL | 0 refills | Status: DC

## 2016-01-05 MED ORDER — FLUCONAZOLE 100 MG PO TABS
200.0000 mg | ORAL_TABLET | Freq: Once | ORAL | Status: AC
  Administered 2016-01-05: 200 mg via ORAL
  Filled 2016-01-05: qty 2

## 2016-01-05 NOTE — Telephone Encounter (Addendum)
Post ED Visit - Positive Culture Follow-up  Culture report reviewed by antimicrobial stewardship pharmacist:  []  Elenor Quinones, Pharm.D. []  Heide Guile, Pharm.D., BCPS []  Parks Neptune, Pharm.D. []  Alycia Rossetti, Pharm.D., BCPS []  [place], [place].D., BCPS, AAHIVP []  Legrand Como, Pharm.D., BCPS, AAHIVP []  Milus Glazier, Pharm.D. []  Rob Evette Doffing, Pharm.D. Gwenlyn Perking PharmD  Positive urine  culture Treated with doxycycline, patient is currently in the ED, reculture and restart Keflex.  Hazle Nordmann 01/05/2016, 9:07 AM

## 2016-01-05 NOTE — ED Triage Notes (Signed)
Pt was seen in ED for vaginal irritation last week and reports back today for continuation of symptoms.  Pt also reports "ovary throbbing" but reports she is supposed to start her menstrual cycle this week.  Pt reports she has taken prescribed medications with no relief.

## 2016-01-05 NOTE — Discharge Instructions (Signed)
Return here as needed.  Follow-up with your primary care doctor °

## 2016-01-06 LAB — URINE CULTURE: Culture: NO GROWTH

## 2016-01-10 NOTE — ED Provider Notes (Signed)
[facility] DEPT Provider Note   CSN: [id number] Arrival date & time: 01/05/16  0803     History   Chief Complaint Chief Complaint  Patient presents with  . Vaginal Itching  . Pelvic Pain    HPI [name] is a 30 y.o. female.  HPI Patient presents to the emergency department with vaginal irritation.  The patient states that she has vaginal irritation is concerned that it is a yeast infection.  The patient states she had recent pelvic examination and does not want any further examination because she has a yeast issue.  She states she also did shave recently in that area as causing a lot of irritation and itching Past Medical History:  Diagnosis Date  . Anemia   . Anxiety    Panic attack  . Arthritis   . Asthma   . Bipolar 1 disorder ([place])   . Cancer ([place])   . Cervical cancer ([place])   . Chronic kidney disease    history of kidney shut down  no current problems  . Constipation   . DDD (degenerative disc disease), cervical   . Edema   . Gallstones   . GERD (gastroesophageal reflux disease)   . History of anemia   . History of renal failure   . HPV (human papilloma virus) anogenital infection   . Leukocytosis    going to hematologist  . Migraine   . Obesity   . Obesity   . Pelvic inflammatory disease (PID) 05-08-11   previous hx. .Hx. childbirth x3-NVD  . PID (pelvic inflammatory disease)   . PTSD (post-traumatic stress disorder)   . Sciatica   . Scoliosis     Patient Active Problem List   Diagnosis Date Noted  . Herniated nucleus pulposus, L4-5 left 11/30/2014  . Tobacco abuse 12/31/2012  . Leukocytosis, unspecified 12/31/2012  . Anemia, unspecified 12/31/2012  . Numbness of arm 11/14/2012  . Chronic pain syndrome 10/28/2012  . Neck pain 10/28/2012  . Back pain 10/28/2012  . Osteoarthritis 10/28/2012  . Obesity   . Migraine   . Asthma     Past Surgical History:  Procedure Laterality Date  . ABDOMINAL EXPLORATION SURGERY  approx 30 years old     rlq(due to adhesions around ovaries)-appendix and ovaries remain  . ANTERIOR CERVICAL DECOMP/DISCECTOMY FUSION N/A 12/09/2012   Procedure: ANTERIOR CERVICAL DECOMPRESSION/DISCECTOMY FUSION STRUCTURAL ALLOGRAFT TRESTLE PLATE CERVICAL FIVE-SIX,SIX-SEVEN;  Surgeon: Otilio Connors, MD;  Location: [facility];  Service: Neurosurgery;  Laterality: N/A;  . CHOLECYSTECTOMY  05/10/2011   Procedure: LAPAROSCOPIC CHOLECYSTECTOMY WITH INTRAOPERATIVE CHOLANGIOGRAM;  Surgeon: Gayland Curry, MD,FACS;  Location: [facility];  Service: General;  Laterality: N/A;  . LUMBAR LAMINECTOMY/DECOMPRESSION MICRODISCECTOMY Left 11/30/2014   Procedure: Left lumbar four-five Microdiskectomy;  Surgeon: Kristeen Miss, MD;  Location: [facility];  Service: Neurosurgery;  Laterality: Left;  . LUMBAR LAMINECTOMY/DECOMPRESSION MICRODISCECTOMY Left 08/15/2015   Procedure: Left Lumbar four-five Microdiskectomy;  Surgeon: Kristeen Miss, MD;  Location: [facility];  Service: Neurosurgery;  Laterality: Left;    OB History    No data available       Home Medications    Prior to Admission medications   Medication Sig Start Date End Date Taking? Authorizing Provider  albuterol (PROVENTIL HFA;VENTOLIN HFA) 108 (90 Base) MCG/ACT inhaler Inhale 1 puff into the lungs every 6 (six) hours as needed for wheezing or shortness of breath.   Yes Historical Provider, MD  ALPRAZolam Duanne Moron) 1 MG tablet Take 1 mg by mouth 3 (  three) times daily as needed for anxiety. Patient states she takes every day   Yes Historical Provider, MD  ARIPiprazole (ABILIFY) 5 MG tablet Take 5 mg by mouth daily.   Yes Historical Provider, MD  busPIRone (BUSPAR) 15 MG tablet Take 15 mg by mouth 2 (two) times daily.   Yes Historical Provider, MD  cetirizine (ZYRTEC) 5 MG tablet Take 1 tablet (5 mg total) by mouth daily. 06/03/15  Yes Alyssa A Lincoln Brigham, MD  cyclobenzaprine (FLEXERIL) 5 MG tablet Take 2 tablets (10 mg total) by mouth 3 (three) times daily as needed for muscle  spasms. 08/22/15  Yes Roxanna Mew, PA-C  diclofenac (VOLTAREN) 75 MG EC tablet Take 75 mg by mouth 2 (two) times daily.   Yes Historical Provider, MD  diphenhydrAMINE (BENADRYL) 25 MG tablet Take 25 mg by mouth every 6 (six) hours as needed for itching.   Yes Historical Provider, MD  doxycycline (VIBRAMYCIN) 100 MG capsule Take 1 capsule (100 mg total) by mouth 2 (two) times daily. One po bid x 7 days 01/02/16  Yes Julianne Rice, MD  esomeprazole (NEXIUM) 40 MG capsule Take 40 mg by mouth daily.    Yes Historical Provider, MD  FLUoxetine (PROZAC) 20 MG capsule Take 80 mg by mouth daily.    Yes Historical Provider, MD  phenazopyridine (PYRIDIUM) 200 MG tablet Take 1 tablet (200 mg total) by mouth 3 (three) times daily. 12/29/15  Yes Veryl Speak, MD  prazosin (MINIPRESS) 2 MG capsule Take 2 mg by mouth at bedtime.    Yes Historical Provider, MD  traZODone (DESYREL) 50 MG tablet Take 100 mg by mouth at bedtime.    Yes Historical Provider, MD  zolpidem (AMBIEN) 10 MG tablet Take 10 mg by mouth at bedtime as needed for sleep.   Yes Historical Provider, MD  cephALEXin (KEFLEX) 500 MG capsule Take 2 capsules (1,000 mg total) by mouth 2 (two) times daily. 01/05/16   Chandy Tarman, PA-C  metroNIDAZOLE (METROGEL VAGINAL) 0.75 % vaginal gel Place 1 Applicatorful vaginally 2 (two) times daily. Patient not taking: Reported on 01/05/2016 10/04/15   Charlann Lange, PA-C  ondansetron (ZOFRAN) 4 MG tablet Take 1 tablet (4 mg total) by mouth every 8 (eight) hours as needed for nausea or vomiting. Patient not taking: Reported on 01/05/2016 06/03/15   Veatrice Bourbon, MD  oxyCODONE-acetaminophen (PERCOCET/ROXICET) 5-325 MG tablet Take 1-2 tablets by mouth every 4 (four) hours as needed for moderate pain. Patient not taking: Reported on 01/05/2016 06/10/15   Margarita Mail, PA-C    Family History Family History  Problem Relation Age of Onset  . Diabetes Mother   . Endometriosis Mother   . Cancer Mother       lung  . Diabetes Father   . Heart disease Father   . Prostate cancer Maternal Grandfather   . Cancer Maternal Grandfather     prostate  . Colon polyps Maternal Grandmother   . Cirrhosis Paternal Grandfather     Social History Social History  Substance Use Topics  . Smoking status: Current Every Day Smoker    Packs/day: 0.50    Years: 12.00    Types: Cigarettes  . Smokeless tobacco: Never Used     Comment: trying to quit  . Alcohol use No     Allergies   Banana; Latex; Orange fruit [citrus]; and Miconazole   Review of Systems Review of Systems All other systems negative except as documented in the HPI. All pertinent positives and negatives as  reviewed in the HPI.  Physical Exam Updated Vital Signs BP 123/68   Pulse (!) 57   Temp 98.1 F (36.7 C) (Oral)   Resp 19   Ht 5\' 3"  (1.6 m)   Wt 99.8 kg   LMP 12/10/2015 (Approximate)   SpO2 97%   BMI 38.97 kg/m   Physical Exam  Constitutional: She is oriented to person, place, and time. She appears well-developed and well-nourished. No distress.  HENT:  Head: Normocephalic and atraumatic.  Mouth/Throat: Oropharynx is clear and moist.  Eyes: Pupils are equal, round, and reactive to light.  Neck: Normal range of motion. Neck supple.  Cardiovascular: Normal rate, regular rhythm and normal heart sounds.  Exam reveals no gallop and no friction rub.   No murmur heard. Pulmonary/Chest: Effort normal and breath sounds normal. No respiratory distress. She has no wheezes.  Abdominal: Soft. Bowel sounds are normal. She exhibits no distension. There is no tenderness.  Genitourinary:     Neurological: She is alert and oriented to person, place, and time. She exhibits normal muscle tone. Coordination normal.  Skin: Skin is warm and dry. No rash noted. No erythema.  Psychiatric: She has a normal mood and affect. Her behavior is normal.  Nursing note and vitals reviewed.    ED Treatments / Results  Labs (all labs ordered  are listed, but only abnormal results are displayed) Labs Reviewed  CBC WITH DIFFERENTIAL/PLATELET - Abnormal; Notable for the following:       Result Value   MCH 25.8 (*)    RDW 17.1 (*)    All other components within normal limits  URINE CULTURE  BASIC METABOLIC PANEL  URINALYSIS, ROUTINE W REFLEX MICROSCOPIC (NOT AT [facility])    EKG  EKG Interpretation None       Radiology No results found.  Procedures Procedures (including critical care time)  Medications Ordered in ED Medications  fluconazole (DIFLUCAN) tablet 200 mg (200 mg Oral Given 01/05/16 1148)     Initial Impression / Assessment and Plan / ED Course  I have reviewed the triage vital signs and the nursing notes.  Pertinent labs & imaging results that were available during my care of the patient were reviewed by me and considered in my medical decision making (see chart for details).  Clinical Course    I did give the patient treatment for possible yeast but I do feel this is more related to her shaving of the vaginal area  Final Clinical Impressions(s) / ED Diagnoses   Final diagnoses:  Vaginal itching  Acute cystitis without hematuria    New Prescriptions Discharge Medication List as of 01/05/2016 12:31 PM       Dalia Heading, PA-C 01/10/16 [place], MD 01/10/16 1621

## 2016-05-25 ENCOUNTER — Encounter (HOSPITAL_COMMUNITY): Payer: Self-pay | Admitting: *Deleted

## 2016-05-25 ENCOUNTER — Emergency Department (HOSPITAL_COMMUNITY)
Admission: EM | Admit: 2016-05-25 | Discharge: 2016-05-25 | Disposition: A | Discharge status: Home / Self Care | Payer: Medicaid Other | Attending: Emergency Medicine | Admitting: Emergency Medicine

## 2016-05-25 DIAGNOSIS — F17210 Nicotine dependence, cigarettes, uncomplicated: Secondary | ICD-10-CM | POA: Diagnosis not present

## 2016-05-25 DIAGNOSIS — N760 Acute vaginitis: Secondary | ICD-10-CM | POA: Diagnosis not present

## 2016-05-25 DIAGNOSIS — N189 Chronic kidney disease, unspecified: Secondary | ICD-10-CM | POA: Insufficient documentation

## 2016-05-25 DIAGNOSIS — R0981 Nasal congestion: Secondary | ICD-10-CM | POA: Insufficient documentation

## 2016-05-25 DIAGNOSIS — J45909 Unspecified asthma, uncomplicated: Secondary | ICD-10-CM | POA: Diagnosis not present

## 2016-05-25 DIAGNOSIS — N898 Other specified noninflammatory disorders of vagina: Secondary | ICD-10-CM | POA: Diagnosis present

## 2016-05-25 DIAGNOSIS — B9689 Other specified bacterial agents as the cause of diseases classified elsewhere: Secondary | ICD-10-CM

## 2016-05-25 DIAGNOSIS — Z91040 Latex allergy status: Secondary | ICD-10-CM | POA: Diagnosis not present

## 2016-05-25 DIAGNOSIS — Z8541 Personal history of malignant neoplasm of cervix uteri: Secondary | ICD-10-CM | POA: Insufficient documentation

## 2016-05-25 LAB — WET PREP, GENITAL
Sperm: NONE SEEN
TRICH WET PREP: NONE SEEN
YEAST WET PREP: NONE SEEN

## 2016-05-25 MED ORDER — AMOXICILLIN 500 MG PO CAPS: 500.0000 mg | ORAL_CAPSULE | Freq: Three times a day (TID) | ORAL | 0 refills | Status: DC

## 2016-05-25 MED ORDER — OXYCODONE-ACETAMINOPHEN 5-325 MG PO TABS
2.0000 | ORAL_TABLET | Freq: Once | ORAL | Status: AC
Start: 2016-05-25 — End: 2016-05-25
  Administered 2016-05-25: 2 via ORAL
  Filled 2016-05-25: qty 2

## 2016-05-25 MED ORDER — METRONIDAZOLE 500 MG PO TABS: 500.0000 mg | ORAL_TABLET | Freq: Two times a day (BID) | ORAL | 0 refills | Status: DC

## 2016-05-25 MED ORDER — FLUCONAZOLE 150 MG PO TABS: 150.0000 mg | ORAL_TABLET | Freq: Every day | ORAL | 1 refills | Status: DC

## 2016-05-25 NOTE — ED Triage Notes (Signed)
Pt reports vaginal itching and sinus pain x 1 week.  Pt is crying in triage, she states that she had an appt with her doctor this am at 1000, when she arrived at the office, she was told the they no longer accept medicaid and was required to pay cash upfront.  She states she got mad and left, and came to the ED.  Pt reports she ran out of her "mental" meds and her xanax and pain med for 2 weeks.  Pt reports chronic back pain.

## 2016-05-25 NOTE — ED Provider Notes (Signed)
[facility] DEPT Provider Note   CSN: [id number] Arrival date & time: 05/25/16  1201     History   Chief Complaint Chief Complaint  Patient presents with  . Vaginal Itching  . Facial Pain    HPI [name] is a 31 y.o. female.  The history is provided by the patient. No language interpreter was used.  Vaginal Itching  This is a new problem. The current episode started more than 1 week ago. The problem occurs constantly. The problem has been gradually worsening. Nothing aggravates the symptoms. Nothing relieves the symptoms. She has tried nothing for the symptoms. The treatment provided no relief.  Pt complains of vaginal itching.  Pt used metrogel vaginal a week ago.  Pt reports she is now itching.  Pt also thinks she has a sinus infection  Past Medical History:  Diagnosis Date  . Anemia   . Anxiety    Panic attack  . Arthritis   . Asthma   . Bipolar 1 disorder ([place])   . Cancer ([place])   . Cervical cancer ([place])   . Chronic kidney disease    history of kidney shut down  no current problems  . Constipation   . DDD (degenerative disc disease), cervical   . Edema   . Gallstones   . GERD (gastroesophageal reflux disease)   . History of anemia   . History of renal failure   . HPV (human papilloma virus) anogenital infection   . Leukocytosis    going to hematologist  . Migraine   . Obesity   . Obesity   . Pelvic inflammatory disease (PID) 05-08-11   previous hx. .Hx. childbirth x3-NVD  . PID (pelvic inflammatory disease)   . PTSD (post-traumatic stress disorder)   . Sciatica   . Scoliosis     Patient Active Problem List   Diagnosis Date Noted  . Herniated nucleus pulposus, L4-5 left 11/30/2014  . Tobacco abuse 12/31/2012  . Leukocytosis, unspecified 12/31/2012  . Anemia, unspecified 12/31/2012  . Numbness of arm 11/14/2012  . Chronic pain syndrome 10/28/2012  . Neck pain 10/28/2012  . Back pain 10/28/2012  . Osteoarthritis 10/28/2012  . Obesity   .  Migraine   . Asthma     Past Surgical History:  Procedure Laterality Date  . ABDOMINAL EXPLORATION SURGERY  approx 31 years old   rlq(due to adhesions around ovaries)-appendix and ovaries remain  . ANTERIOR CERVICAL DECOMP/DISCECTOMY FUSION N/A 12/09/2012   Procedure: ANTERIOR CERVICAL DECOMPRESSION/DISCECTOMY FUSION STRUCTURAL ALLOGRAFT TRESTLE PLATE CERVICAL FIVE-SIX,SIX-SEVEN;  Surgeon: Otilio Connors, MD;  Location: [facility];  Service: Neurosurgery;  Laterality: N/A;  . CHOLECYSTECTOMY  05/10/2011   Procedure: LAPAROSCOPIC CHOLECYSTECTOMY WITH INTRAOPERATIVE CHOLANGIOGRAM;  Surgeon: Gayland Curry, MD,FACS;  Location: [facility];  Service: General;  Laterality: N/A;  . LUMBAR LAMINECTOMY/DECOMPRESSION MICRODISCECTOMY Left 11/30/2014   Procedure: Left lumbar four-five Microdiskectomy;  Surgeon: Kristeen Miss, MD;  Location: [facility];  Service: Neurosurgery;  Laterality: Left;  . LUMBAR LAMINECTOMY/DECOMPRESSION MICRODISCECTOMY Left 08/15/2015   Procedure: Left Lumbar four-five Microdiskectomy;  Surgeon: Kristeen Miss, MD;  Location: [facility];  Service: Neurosurgery;  Laterality: Left;    OB History    No data available       Home Medications    Prior to Admission medications   Medication Sig Start Date End Date Taking? Authorizing Provider  ALPRAZolam Duanne Moron) 1 MG tablet Take 1 mg by mouth 3 (three) times daily. Patient states she takes every day   Yes  Historical Provider, MD  ARIPiprazole (ABILIFY) 5 MG tablet Take 5 mg by mouth daily.   Yes Historical Provider, MD  esomeprazole (NEXIUM) 40 MG capsule Take 40 mg by mouth 2 (two) times daily before a meal.    Yes Historical Provider, MD  FLUoxetine (PROZAC) 40 MG capsule Take 80 mg by mouth daily.   Yes Historical Provider, MD  oxyCODONE-acetaminophen (PERCOCET) 10-325 MG tablet Take 1-2 tablets by mouth every 4 (four) hours as needed for pain.   Yes Historical Provider, MD  ranitidine (ZANTAC) 75 MG tablet Take 75 mg by mouth  daily as needed for heartburn.   Yes Historical Provider, MD  traZODone (DESYREL) 100 MG tablet Take 100 mg by mouth at bedtime.   Yes Historical Provider, MD  zolpidem (AMBIEN) 10 MG tablet Take 10 mg by mouth at bedtime as needed for sleep.   Yes Historical Provider, MD  albuterol (PROVENTIL HFA;VENTOLIN HFA) 108 (90 Base) MCG/ACT inhaler Inhale 1 puff into the lungs every 6 (six) hours as needed for wheezing or shortness of breath.    Historical Provider, MD  busPIRone (BUSPAR) 15 MG tablet Take 15 mg by mouth 2 (two) times daily.    Historical Provider, MD  cetirizine (ZYRTEC) 5 MG tablet Take 1 tablet (5 mg total) by mouth daily. 06/03/15   Veatrice Bourbon, MD  cyclobenzaprine (FLEXERIL) 5 MG tablet Take 2 tablets (10 mg total) by mouth 3 (three) times daily as needed for muscle spasms. 08/22/15   Roxanna Mew, PA-C  diclofenac (VOLTAREN) 75 MG EC tablet Take 75 mg by mouth 2 (two) times daily.    Historical Provider, MD  diphenhydrAMINE (BENADRYL) 25 MG tablet Take 25 mg by mouth every 6 (six) hours as needed for itching.    Historical Provider, MD  doxycycline (VIBRAMYCIN) 100 MG capsule Take 1 capsule (100 mg total) by mouth 2 (two) times daily. One po bid x 7 days 01/02/16   Julianne Rice, MD  prazosin (MINIPRESS) 2 MG capsule Take 2 mg by mouth at bedtime.     Historical Provider, MD    Family History Family History  Problem Relation Age of Onset  . Diabetes Mother   . Endometriosis Mother   . Cancer Mother     lung  . Diabetes Father   . Heart disease Father   . Prostate cancer Maternal Grandfather   . Cancer Maternal Grandfather     prostate  . Colon polyps Maternal Grandmother   . Cirrhosis Paternal Grandfather     Social History Social History  Substance Use Topics  . Smoking status: Current Every Day Smoker    Packs/day: 0.50    Years: 12.00    Types: Cigarettes  . Smokeless tobacco: Never Used     Comment: trying to quit  . Alcohol use No     Allergies     Banana; Latex; Orange fruit [citrus]; and Miconazole   Review of Systems Review of Systems  All other systems reviewed and are negative.    Physical Exam Updated Vital Signs BP 155/80 (BP Location: Right Arm)   Pulse 71   Temp 97.9 F (36.6 C) (Oral)   Resp 18   Ht 5\' 3"  (1.6 m)   Wt 99.8 kg   LMP 05/24/2016   SpO2 98%   BMI 38.97 kg/m   Physical Exam  Constitutional: She appears well-developed and well-nourished.  HENT:  Head: Normocephalic and atraumatic.  Right Ear: External ear normal.  Left Ear: External ear  normal.  Nose: Nose normal.  Mouth/Throat: Oropharynx is clear and moist.  Tender maxillary sinuses   Eyes: Conjunctivae are normal. Pupils are equal, round, and reactive to light.  Neck: Normal range of motion. Neck supple.  Cardiovascular: Normal rate.   Abdominal: Soft. There is no tenderness.  Genitourinary: Vagina normal.  Genitourinary Comments: Moderate vaginal bleeding  No lesions   Musculoskeletal: Normal range of motion.  Skin: Skin is warm.  Psychiatric: She has a normal mood and affect. Her behavior is normal.     ED Treatments / Results  Labs (all labs ordered are listed, but only abnormal results are displayed) Labs Reviewed  WET PREP, GENITAL  GC/CHLAMYDIA PROBE AMP ([place]) NOT AT [facility]    EKG  EKG Interpretation None       Radiology No results found.  Procedures Procedures (including critical care time)  Medications Ordered in ED Medications - No data to display   Initial Impression / Assessment and Plan / ED Course  I have reviewed the triage vital signs and the nursing notes.  Pertinent labs & imaging results that were available during my care of the patient were reviewed by me and considered in my medical decision making (see chart for details).     Pt reports unable to see the MD who treats her mental health and chronic pain. Pt reports MD is cah only. He does not take her medication.  Final Clinical  Impressions(s) / ED Diagnoses   Final diagnoses:  BV (bacterial vaginosis)  Sinus congestion    New Prescriptions New Prescriptions   AMOXICILLIN (AMOXIL) 500 MG CAPSULE    Take 1 capsule (500 mg total) by mouth 3 (three) times daily.   FLUCONAZOLE (DIFLUCAN) 150 MG TABLET    Take 1 tablet (150 mg total) by mouth daily.   METRONIDAZOLE (FLAGYL) 500 MG TABLET    Take 1 tablet (500 mg total) by mouth 2 (two) times daily.   Pt offered social worker to assist with finding new MD.  Pt had to leave before seeing social work   Fransico Meadow, [place] 05/25/16 [place], MD 05/25/16 [phone]

## 2016-05-28 LAB — GC/CHLAMYDIA PROBE AMP (~~LOC~~) NOT AT ARMC
CHLAMYDIA, DNA PROBE: NEGATIVE
Neisseria Gonorrhea: NEGATIVE

## 2016-07-10 ENCOUNTER — Other Ambulatory Visit: Payer: Self-pay | Admitting: Neurological Surgery

## 2016-07-16 NOTE — Pre-Procedure Instructions (Signed)
Minha Fulco  07/16/2016      [id number] [address], [place] [address] Phone: [phone] Fax: [phone]    Your procedure is scheduled on Fri, April 27 @ 12:00 PM  Report to [facility] Admitting at 10:00 AM  Call this number if you have problems the morning of surgery:  [phone]   Remember:  Do not eat food or drink liquids after midnight.  Take these medicines the morning of surgery with A SIP OF WATER Albuterol<Bring Your Inhaler With You> and Nexium(Esomeprazole)             Stop taking any Vitamins or Herbal Medications. No Goody's,BC's,Aleve,Advil,Motrin,Ibuprofen,Aspirin,or Fish Oil.    Do not wear jewelry, make-up or nail polish.  Do not wear lotions, powders, perfumes, or deoderant.  Do not shave 48 hours prior to surgery.   Do not bring valuables to the hospital.  [facility] is not responsible for any belongings or valuables.  Contacts, dentures or bridgework may not be worn into surgery.  Leave your suitcase in the car.  After surgery it may be brought to your room.  For patients admitted to the hospital, discharge time will be determined by your treatment team.  Patients discharged the day of surgery will not be allowed to drive home.    [facility]  Before surgery, you can play an important role.  Because skin is not sterile, your skin needs to be as free of germs as possible.  You can reduce the number of germs on you skin by washing with CHG (chlorahexidine gluconate) soap before surgery.  CHG is an antiseptic cleaner which kills germs and bonds with the skin to continue killing germs even after washing.  Please DO NOT use if you have an allergy to CHG or antibacterial soaps.  If your skin becomes reddened/irritated stop using the CHG and inform your nurse when you arrive at Short Stay.  Do not shave  (including legs and underarms) for at least 48 hours prior to the first CHG shower.  You may shave your face.  Please follow these instructions carefully:   1.  Shower with CHG Soap the night before surgery and the                                morning of Surgery.  2.  If you choose to wash your hair, wash your hair first as usual with your       normal shampoo.  3.  After you shampoo, rinse your hair and body thoroughly to remove the                      Shampoo.  4.  Use CHG as you would any other liquid soap.  You can apply chg directly       to the skin and wash gently with scrungie or a clean washcloth.  5.  Apply the CHG Soap to your body ONLY FROM THE NECK DOWN.        Do not use on open wounds or open sores.  Avoid contact with your eyes,       ears, mouth and genitals (private parts).  Wash genitals (private parts)       with your normal soap.  6.  Wash thoroughly, paying  special attention to the area where your surgery        will be performed.  7.  Thoroughly rinse your body with warm water from the neck down.  8.  DO NOT shower/wash with your normal soap after using and rinsing off       the CHG Soap.  9.  Pat yourself dry with a clean towel.            10.  Wear clean pajamas.            11.  Place clean sheets on your bed the night of your first shower and do not        sleep with pets.  Day of Surgery  Do not apply any lotions/deoderants the morning of surgery.  Please wear clean clothes to the hospital/surgery center.     Please read over the following fact sheets that you were given. Pain Booklet, Coughing and Deep Breathing, MRSA Information and Surgical Site Infection Prevention

## 2016-07-17 ENCOUNTER — Emergency Department (HOSPITAL_COMMUNITY)
Admission: EM | Admit: 2016-07-17 | Discharge: 2016-07-17 | Disposition: A | Discharge status: Home / Self Care | Payer: Medicaid Other | Attending: Dermatology | Admitting: Dermatology

## 2016-07-17 ENCOUNTER — Encounter (HOSPITAL_COMMUNITY)
Admission: RE | Admit: 2016-07-17 | Discharge: 2016-07-17 | Disposition: A | Discharge status: Home / Self Care | Payer: Medicaid Other | Source: Ambulatory Visit | Attending: Neurological Surgery | Admitting: Neurological Surgery

## 2016-07-17 ENCOUNTER — Encounter (HOSPITAL_COMMUNITY): Payer: Self-pay | Admitting: Emergency Medicine

## 2016-07-17 DIAGNOSIS — Z3201 Encounter for pregnancy test, result positive: Secondary | ICD-10-CM | POA: Insufficient documentation

## 2016-07-17 DIAGNOSIS — Z5321 Procedure and treatment not carried out due to patient leaving prior to being seen by health care provider: Secondary | ICD-10-CM | POA: Insufficient documentation

## 2016-07-17 DIAGNOSIS — Z3200 Encounter for pregnancy test, result unknown: Secondary | ICD-10-CM | POA: Diagnosis present

## 2016-07-17 DIAGNOSIS — Z01818 Encounter for other preprocedural examination: Secondary | ICD-10-CM | POA: Insufficient documentation

## 2016-07-17 LAB — BASIC METABOLIC PANEL
ANION GAP: 8 (ref 5–15)
BUN: 9 mg/dL (ref 6–20)
CALCIUM: 8.9 mg/dL (ref 8.9–10.3)
CHLORIDE: 106 mmol/L (ref 101–111)
CO2: 21 mmol/L — AB (ref 22–32)
CREATININE: 0.85 mg/dL (ref 0.44–1.00)
GFR calc Af Amer: >60 mL/min (ref >60)
GFR calc non Af Amer: >60 mL/min (ref >60)
GLUCOSE: 82 mg/dL (ref 65–99)
Potassium: 3.6 mmol/L (ref 3.5–5.1)
Sodium: 135 mmol/L (ref 135–145)

## 2016-07-17 LAB — CBC
HCT: 35.6 % — ABNORMAL LOW (ref 36.0–46.0)
Hemoglobin: 11.7 g/dL — ABNORMAL LOW (ref 12.0–15.0)
MCH: 26.4 pg (ref 26.0–34.0)
MCHC: 32.9 g/dL (ref 30.0–36.0)
MCV: 80.4 fL (ref 78.0–100.0)
PLATELETS: 275 10*3/uL (ref 150–400)
RBC: 4.43 MIL/uL (ref 3.87–5.11)
RDW: 16.2 % — AB (ref 11.5–15.5)
WBC: 11.7 10*3/uL — AB (ref 4.0–10.5)

## 2016-07-17 LAB — HCG, SERUM, QUALITATIVE: PREG SERUM: POSITIVE — AB

## 2016-07-17 LAB — SURGICAL PCR SCREEN
MRSA, PCR: NEGATIVE
STAPHYLOCOCCUS AUREUS: NEGATIVE

## 2016-07-17 NOTE — Progress Notes (Signed)
This patient scored at an elevated risk for obstructive sleep apnea using the STOP BANG TOOL during a pre surgical testing 

## 2016-07-17 NOTE — Progress Notes (Signed)
Positive pregnancy test left voicemail with CMA at Dr [address] [address]

## 2016-07-17 NOTE — ED Notes (Signed)
Pt arrives to ED to confirm she is pregnant after receiving phone call. Pt has already had HCG blood taken today and is positive. Pt denies any abd pain or bleeding. Pt states she doesn't need to be seen in ED she just wants to confirm her results are positive.

## 2016-07-20 ENCOUNTER — Ambulatory Visit (HOSPITAL_COMMUNITY): Admission: RE | Admit: 2016-07-20 | Payer: Medicaid Other | Source: Ambulatory Visit | Admitting: Neurological Surgery

## 2016-07-20 ENCOUNTER — Encounter (HOSPITAL_COMMUNITY): Admission: RE | Payer: Self-pay | Source: Ambulatory Visit

## 2016-07-20 SURGERY — LUMBAR LAMINECTOMY/DECOMPRESSION MICRODISCECTOMY 1 LEVEL
Anesthesia: General | Site: Back | Laterality: Left

## 2016-08-03 ENCOUNTER — Encounter (HOSPITAL_COMMUNITY): Payer: Self-pay | Admitting: *Deleted

## 2016-08-03 ENCOUNTER — Inpatient Hospital Stay (HOSPITAL_COMMUNITY)
Admission: AD | Admit: 2016-08-03 | Discharge: 2016-08-03 | Disposition: A | Discharge status: Home / Self Care | Payer: Medicaid Other | Source: Ambulatory Visit | Attending: Family Medicine | Admitting: Family Medicine

## 2016-08-03 DIAGNOSIS — R109 Unspecified abdominal pain: Secondary | ICD-10-CM | POA: Diagnosis not present

## 2016-08-03 DIAGNOSIS — M5030 Other cervical disc degeneration, unspecified cervical region: Secondary | ICD-10-CM | POA: Insufficient documentation

## 2016-08-03 DIAGNOSIS — O99611 Diseases of the digestive system complicating pregnancy, first trimester: Secondary | ICD-10-CM | POA: Insufficient documentation

## 2016-08-03 DIAGNOSIS — Z801 Family history of malignant neoplasm of trachea, bronchus and lung: Secondary | ICD-10-CM | POA: Insufficient documentation

## 2016-08-03 DIAGNOSIS — Z8541 Personal history of malignant neoplasm of cervix uteri: Secondary | ICD-10-CM | POA: Insufficient documentation

## 2016-08-03 DIAGNOSIS — O26831 Pregnancy related renal disease, first trimester: Secondary | ICD-10-CM | POA: Diagnosis not present

## 2016-08-03 DIAGNOSIS — O99351 Diseases of the nervous system complicating pregnancy, first trimester: Secondary | ICD-10-CM | POA: Diagnosis not present

## 2016-08-03 DIAGNOSIS — O9989 Other specified diseases and conditions complicating pregnancy, childbirth and the puerperium: Secondary | ICD-10-CM

## 2016-08-03 DIAGNOSIS — N949 Unspecified condition associated with female genital organs and menstrual cycle: Secondary | ICD-10-CM

## 2016-08-03 DIAGNOSIS — Z833 Family history of diabetes mellitus: Secondary | ICD-10-CM | POA: Insufficient documentation

## 2016-08-03 DIAGNOSIS — N189 Chronic kidney disease, unspecified: Secondary | ICD-10-CM | POA: Insufficient documentation

## 2016-08-03 DIAGNOSIS — F17210 Nicotine dependence, cigarettes, uncomplicated: Secondary | ICD-10-CM | POA: Insufficient documentation

## 2016-08-03 DIAGNOSIS — F4310 Post-traumatic stress disorder, unspecified: Secondary | ICD-10-CM | POA: Insufficient documentation

## 2016-08-03 DIAGNOSIS — O26891 Other specified pregnancy related conditions, first trimester: Secondary | ICD-10-CM | POA: Diagnosis present

## 2016-08-03 DIAGNOSIS — Z9889 Other specified postprocedural states: Secondary | ICD-10-CM | POA: Insufficient documentation

## 2016-08-03 DIAGNOSIS — Z3A13 13 weeks gestation of pregnancy: Secondary | ICD-10-CM | POA: Diagnosis present

## 2016-08-03 DIAGNOSIS — F410 Panic disorder [episodic paroxysmal anxiety] without agoraphobia: Secondary | ICD-10-CM | POA: Insufficient documentation

## 2016-08-03 DIAGNOSIS — R350 Frequency of micturition: Secondary | ICD-10-CM | POA: Insufficient documentation

## 2016-08-03 DIAGNOSIS — K219 Gastro-esophageal reflux disease without esophagitis: Secondary | ICD-10-CM | POA: Insufficient documentation

## 2016-08-03 DIAGNOSIS — Z8249 Family history of ischemic heart disease and other diseases of the circulatory system: Secondary | ICD-10-CM | POA: Insufficient documentation

## 2016-08-03 DIAGNOSIS — Z79899 Other long term (current) drug therapy: Secondary | ICD-10-CM | POA: Insufficient documentation

## 2016-08-03 DIAGNOSIS — F319 Bipolar disorder, unspecified: Secondary | ICD-10-CM | POA: Diagnosis not present

## 2016-08-03 DIAGNOSIS — Z8371 Family history of colonic polyps: Secondary | ICD-10-CM | POA: Insufficient documentation

## 2016-08-03 DIAGNOSIS — J45909 Unspecified asthma, uncomplicated: Secondary | ICD-10-CM | POA: Diagnosis not present

## 2016-08-03 DIAGNOSIS — R110 Nausea: Secondary | ICD-10-CM | POA: Insufficient documentation

## 2016-08-03 DIAGNOSIS — O99341 Other mental disorders complicating pregnancy, first trimester: Secondary | ICD-10-CM | POA: Insufficient documentation

## 2016-08-03 DIAGNOSIS — O99211 Obesity complicating pregnancy, first trimester: Secondary | ICD-10-CM | POA: Insufficient documentation

## 2016-08-03 DIAGNOSIS — O99331 Smoking (tobacco) complicating pregnancy, first trimester: Secondary | ICD-10-CM | POA: Insufficient documentation

## 2016-08-03 DIAGNOSIS — O99511 Diseases of the respiratory system complicating pregnancy, first trimester: Secondary | ICD-10-CM | POA: Insufficient documentation

## 2016-08-03 DIAGNOSIS — R102 Pelvic and perineal pain: Secondary | ICD-10-CM | POA: Insufficient documentation

## 2016-08-03 DIAGNOSIS — Z9049 Acquired absence of other specified parts of digestive tract: Secondary | ICD-10-CM | POA: Insufficient documentation

## 2016-08-03 DIAGNOSIS — M419 Scoliosis, unspecified: Secondary | ICD-10-CM | POA: Insufficient documentation

## 2016-08-03 DIAGNOSIS — Z8042 Family history of malignant neoplasm of prostate: Secondary | ICD-10-CM | POA: Insufficient documentation

## 2016-08-03 DIAGNOSIS — Z888 Allergy status to other drugs, medicaments and biological substances status: Secondary | ICD-10-CM | POA: Insufficient documentation

## 2016-08-03 DIAGNOSIS — E669 Obesity, unspecified: Secondary | ICD-10-CM | POA: Diagnosis not present

## 2016-08-03 LAB — URINALYSIS, ROUTINE W REFLEX MICROSCOPIC
Bilirubin Urine: NEGATIVE
Glucose, UA: NEGATIVE mg/dL
Hgb urine dipstick: NEGATIVE
KETONES UR: NEGATIVE mg/dL
Nitrite: NEGATIVE
PH: 7 (ref 5.0–8.0)
Protein, ur: NEGATIVE mg/dL
SPECIFIC GRAVITY, URINE: 1.027 (ref 1.005–1.030)

## 2016-08-03 LAB — WET PREP, GENITAL
CLUE CELLS WET PREP: NONE SEEN
Sperm: NONE SEEN
TRICH WET PREP: NONE SEEN
Yeast Wet Prep HPF POC: NONE SEEN

## 2016-08-03 MED ORDER — PRENATAL PLUS 27-1 MG PO TABS: 1.0000 | ORAL_TABLET | Freq: Every day | ORAL | 0 refills | Status: DC

## 2016-08-03 NOTE — MAU Note (Signed)
Pt has been having abd cramping for 2-3 days. Went to Primary care doctor yesterday and had cultures taken but no results where called to her yet. Pt still hurting. Denies any vaginal bleeding or discharge but reprots an odor.Found out she was pregnant last week.

## 2016-08-03 NOTE — Discharge Instructions (Signed)
°  Round Ligament Pain The round ligament is a cord of muscle and tissue that helps to support the uterus. It can become a source of pain during pregnancy if it becomes stretched or twisted as the baby grows. The pain usually begins in the second trimester of pregnancy, and it can come and go until the baby is delivered. It is not a serious problem, and it does not cause harm to the baby. Round ligament pain is usually a short, sharp, and pinching pain, but it can also be a dull, lingering, and aching pain. The pain is felt in the lower side of the abdomen or in the groin. It usually starts deep in the groin and moves up to the outside of the hip area. Pain can occur with:  A sudden change in position.  Rolling over in bed.  Coughing or sneezing.  Physical activity. Follow these instructions at home: Watch your condition for any changes. Take these steps to help with your pain:  When the pain starts, relax. Then try:  Sitting down.  Flexing your knees up to your abdomen.  Lying on your side with one pillow under your abdomen and another pillow between your legs.  Sitting in a warm bath for 15-20 minutes or until the pain goes away.  Take over-the-counter and prescription medicines only as told by your health care provider.  Move slowly when you sit and stand.  Avoid long walks if they cause pain.  Stop or lessen your physical activities if they cause pain. Contact a health care provider if:  Your pain does not go away with treatment.  You feel pain in your back that you did not have before.  Your medicine is not helping. Get help right away if:  You develop a fever or chills.  You develop uterine contractions.  You develop vaginal bleeding.  You develop nausea or vomiting.  You develop diarrhea.  You have pain when you urinate. This information is not intended to replace advice given to you by your health care provider. Make sure you discuss any questions you have  with your health care provider. Document Released: 12/20/2007 Document Revised: 08/18/2015 Document Reviewed: 05/19/2014 Elsevier Interactive Patient Education  2017 [employer].
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
## 2016-08-05 LAB — URINE CULTURE

## 2016-08-06 LAB — GC/CHLAMYDIA PROBE AMP (~~LOC~~) NOT AT ARMC
Chlamydia: NEGATIVE
Neisseria Gonorrhea: NEGATIVE

## 2016-08-07 DIAGNOSIS — F319 Bipolar disorder, unspecified: Secondary | ICD-10-CM | POA: Diagnosis present

## 2016-08-10 DIAGNOSIS — Z8751 Personal history of pre-term labor: Secondary | ICD-10-CM | POA: Insufficient documentation

## 2016-08-24 DIAGNOSIS — Z332 Encounter for elective termination of pregnancy: Secondary | ICD-10-CM

## 2016-08-24 HISTORY — DX: Encounter for elective termination of pregnancy: Z33.2

## 2016-09-03 IMAGING — MR MR LUMBAR SPINE W/O CM
4 of 5 series · 25 of 48 positions shown · non-contrast
Comparison: 10/04/2010

CLINICAL DATA: Low back pain, status post fall February 2014. Left
leg pain.

EXAM:
MRI LUMBAR SPINE WITHOUT CONTRAST
TECHNIQUE: Multiplanar, multisequence MR imaging of the lumbar spine was
performed. No intravenous contrast was administered.

[Series 3: T1 · sagittal · 4.0mm · 0.55mm/px · 5 of 13 slices shown (1 of 2)]
[im 1/13]
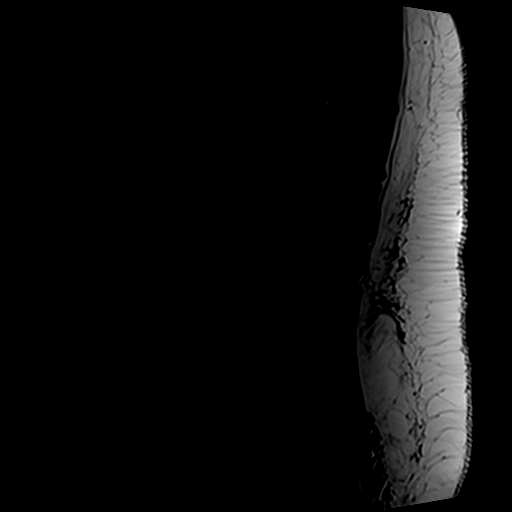
[im 4/13]
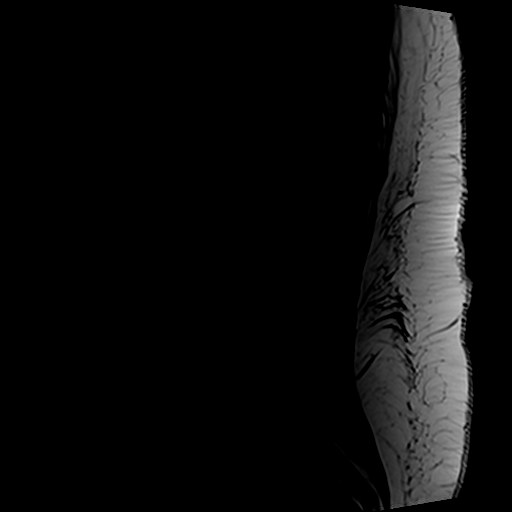
[im 7/13]
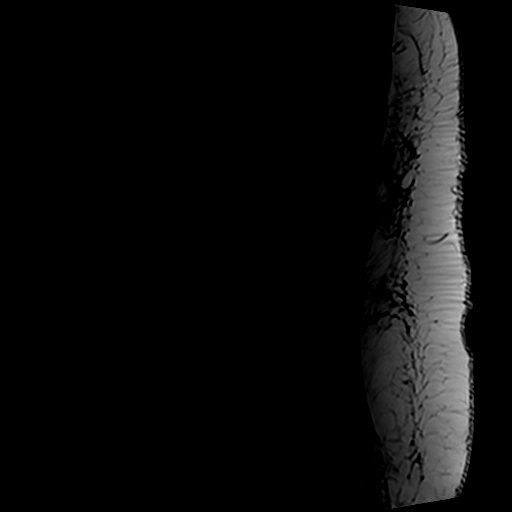
[im 10/13]
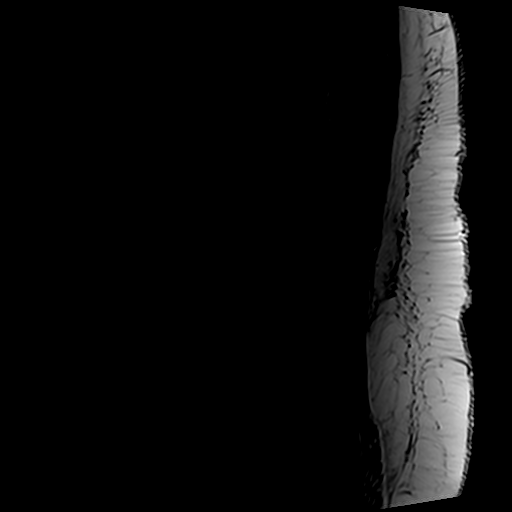
[im 13/13]
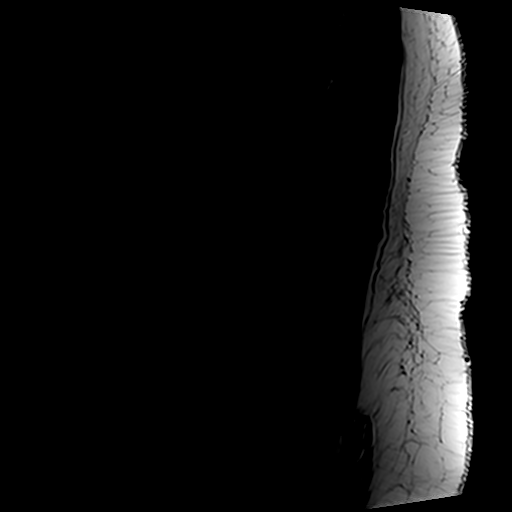

[Series 5: T2 post-contrast · sagittal · 4.0mm · 0.55mm/px · 6 of 13 slices shown]
[im 1/13]
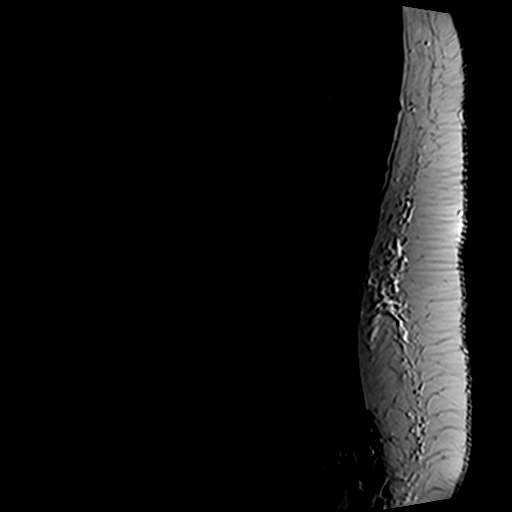
[im 3/13]
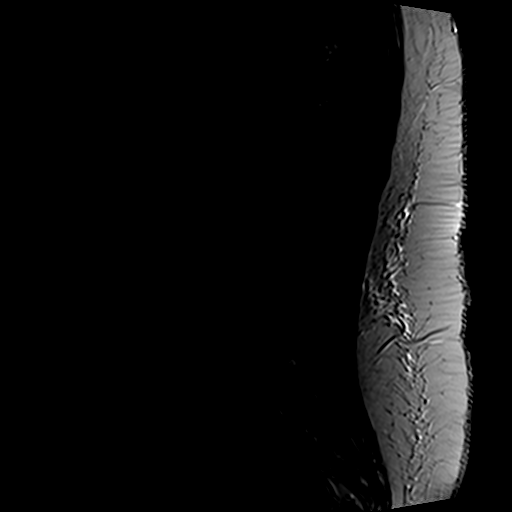
[im 5/13]
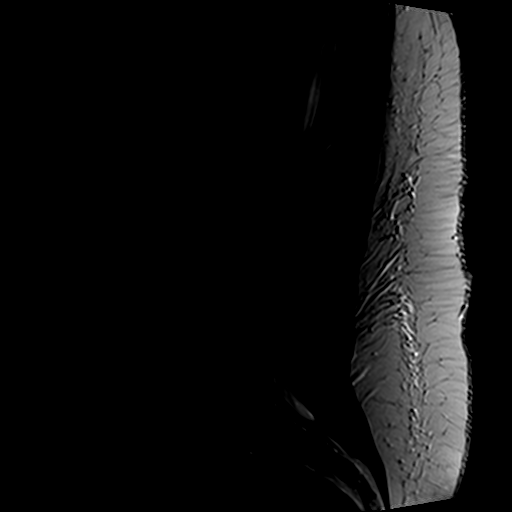
[im 8/13]
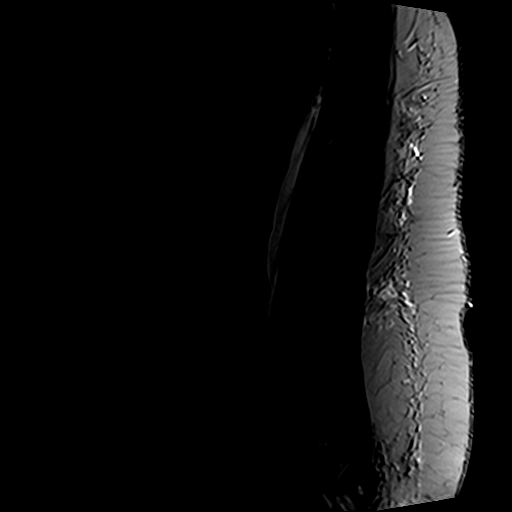
[im 10/13]
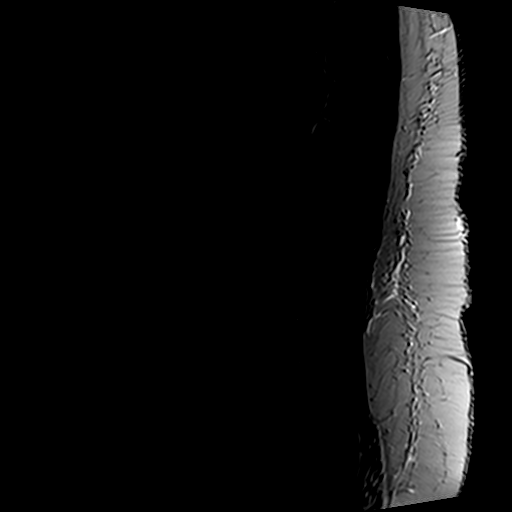
[im 13/13]
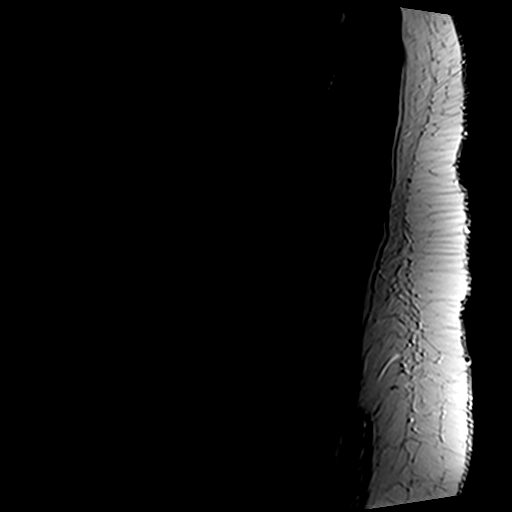

[Series 6: T2 · axial · 4.0mm · 0.70mm/px · z∈[-150,+70]mm · 10 of 36 slices shown]
[im 3/36]
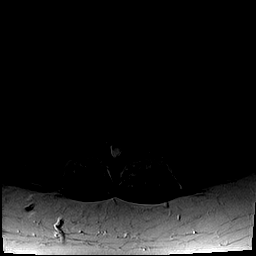
[im 5/36]
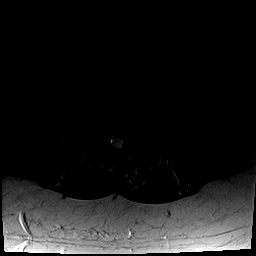
[im 8/36]
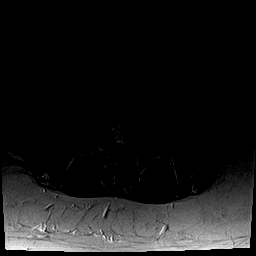
[im 12/36]
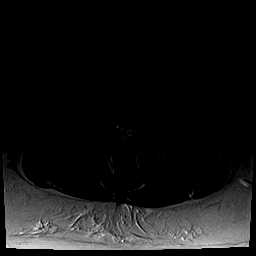
[im 17/36]
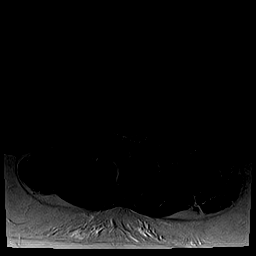
[im 19/36]
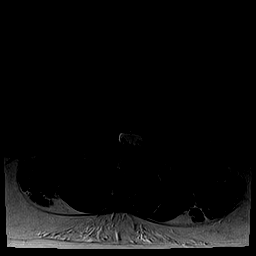
[im 22/36]
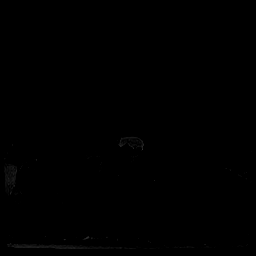
[im 26/36]
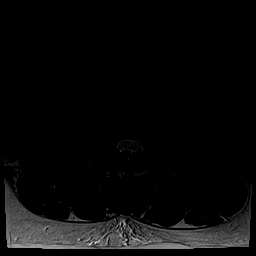
[im 31/36]
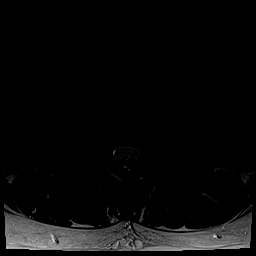
[im 36/36]
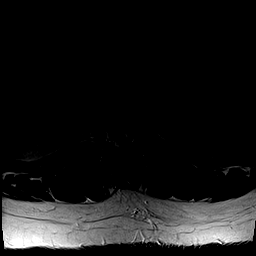

[Series 7: T1 · axial · 4.0mm · 0.35mm/px · z∈[-150,+8]mm · 4 of 36 slices shown (2 of 2)]
[im 3/36]
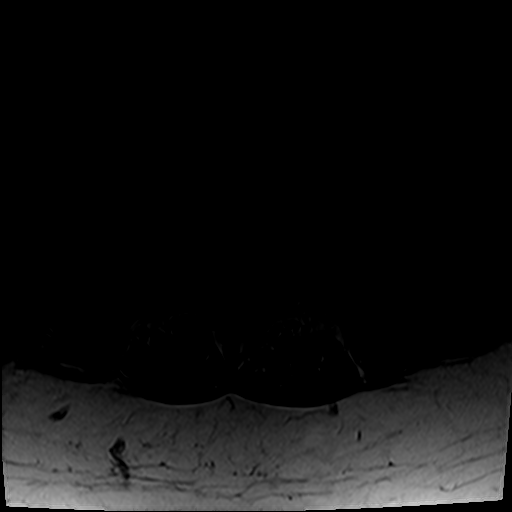
[im 5/36]
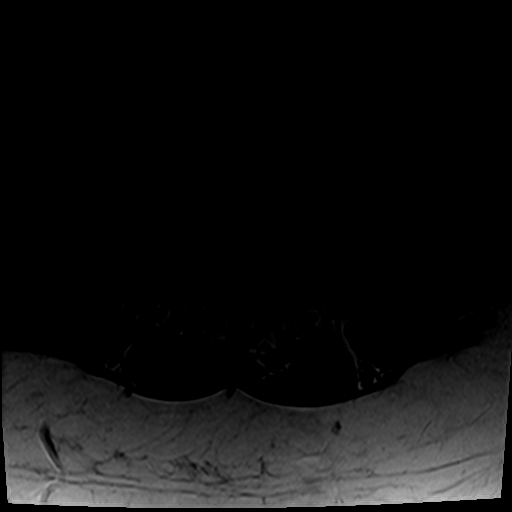
[im 19/36]
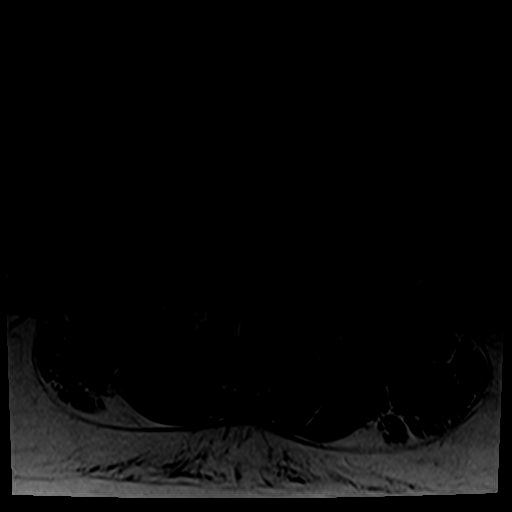
[im 31/36]
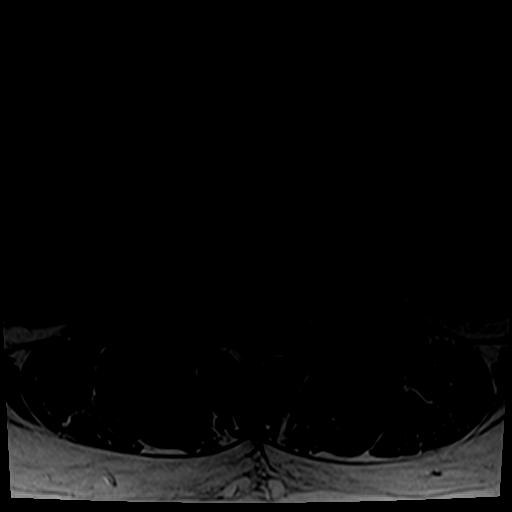

[25 of 48 positions shown; findings below may reference images not displayed]

FINDINGS: The vertebral bodies of the lumbar spine are normal in size. The
vertebral bodies of the lumbar spine are normal in alignment. There
is normal bone marrow signal demonstrated throughout the vertebra.
There is degenerative disc disease with disc height loss at L1-2,
L2-3, L3-4 and L4-5.

The spinal cord is normal in signal and contour. The cord terminates
normally at T12-L1 . The nerve roots of the cauda equina and the
filum terminale are normal.

The visualized portions of the SI joints are unremarkable.

The imaged intra-abdominal contents are unremarkable.

T11-12: There is a left paracentral/foraminal disc protrusion with
left lateral recess narrowing.

T12-L1: No significant disc bulge. No evidence of neural foraminal
stenosis. No central canal stenosis.

L1-L2: No significant disc bulge. No evidence of neural foraminal
stenosis. No central canal stenosis.

L2-L3: Minimal broad-based disc bulge. No evidence of neural
foraminal stenosis. No central canal stenosis.

L3-L4: Moderate size right paracentral disc protrusion which
contacts the right intraspinal L4 nerve root and results in right
lateral recess narrowing. No evidence of neural foraminal stenosis.
No central canal stenosis.

L4-L5: There is a large left paracentral disc protrusion measuring
approximately 2 x 1.3 cm with caudal migration of disc material and
impingement of the left intraspinal L5 nerve root and with mass
effect on the left intraspinal S1 nerve root. No evidence of neural
foraminal stenosis. There is moderate narrowing of the central
canal.

L5-S1: Small left paracentral disc protrusion with narrowing of the
left lateral recess. No evidence of neural foraminal stenosis. No
central canal stenosis.
IMPRESSION: 1. At L4-5 there is a large left paracentral disc protrusion
measuring approximately 2 x 1.3 cm with caudal migration of disc
material and impingement of the left intraspinal L5 nerve root and
with mass effect on the left intraspinal S1 nerve root.
2. At L5-S1, there is a small left paracentral disc protrusion with
narrowing of the left lateral recess.
3. At L3-4 there is a moderate size right paracentral disc
protrusion contacting the right intraspinal L4 nerve root and
resulting in right lateral recess stenosis.
4. At T11-12 there is a small left paracentral/foraminal disc
protrusion with left lateral recess stenosis. This is unchanged
compared with 10/04/2010.

## 2016-09-05 ENCOUNTER — Other Ambulatory Visit: Payer: Self-pay | Admitting: Neurological Surgery

## 2016-09-18 NOTE — Pre-Procedure Instructions (Signed)
[name]  09/18/2016      [id number] [address], [place] [address] Phone: [phone] Fax: [phone]   Your procedure is scheduled on September 28, 2016.Marland Kitchen Report to [facility] Admitting at 1230 PM. Call this number if you have problems the morning of surgery:[phone]   Remember:  Do not eat food or drink liquids after midnight September 27, 2016.  Take these medicines the morning of surgery with A SIP OF WATER albuterol (proair), lurasidone (latuda), ranitidine (zantac).  7 days prior to surgery STOP taking any Aspirin, Aleve, Naproxen, Ibuprofen, Motrin, Advil, Goody's, BC's, all herbal medications, fish oil, and all vitamins   Do not wear jewelry, make-up or nail polish.  Do not wear lotions, powders, or perfumes, or deoderant.  Do not shave 48 hours prior to surgery.  Men may shave face and neck.  Do not bring valuables to the hospital.  [facility] is not responsible for any belongings or valuables.  Contacts, dentures or bridgework may not be worn into surgery.  Leave your suitcase in the car.  After surgery it may be brought to your room.  For patients admitted to the hospital, discharge time will be determined by your treatment team.  Patients discharged the day of surgery will not be allowed to drive home.   Special instructions:   [place]- Preparing For Surgery  Before surgery, you can play an important role. Because skin is not sterile, your skin needs to be as free of germs as possible. You can reduce the number of germs on your skin by washing with CHG (chlorahexidine gluconate) Soap before surgery.  CHG is an antiseptic cleaner which kills germs and bonds with the skin to continue killing germs even after washing.  Please do not use if you have an allergy to CHG or antibacterial soaps. If your skin becomes reddened/irritated stop using the CHG.  Do  not shave (including legs and underarms) for at least 48 hours prior to first CHG shower. It is OK to shave your face.  Please follow these instructions carefully.   1. Shower the NIGHT BEFORE SURGERY and the MORNING OF SURGERY with CHG.   2. If you chose to wash your hair, wash your hair first as usual with your normal shampoo.  3. After you shampoo, rinse your hair and body thoroughly to remove the shampoo.  4. Use CHG as you would any other liquid soap. You can apply CHG directly to the skin and wash gently with a scrungie or a clean washcloth.   5. Apply the CHG Soap to your body ONLY FROM THE NECK DOWN.  Do not use on open wounds or open sores. Avoid contact with your eyes, ears, mouth and genitals (private parts). Wash genitals (private parts) with your normal soap.  6. Wash thoroughly, paying special attention to the area where your surgery will be performed.  7. Thoroughly rinse your body with warm water from the neck down.  8. DO NOT shower/wash with your normal soap after using and rinsing off the CHG Soap.  9. Pat yourself dry with a CLEAN TOWEL.   10. Wear CLEAN PAJAMAS   11. Place CLEAN SHEETS on your bed the night of your first shower and DO NOT SLEEP WITH PETS.    Day of Surgery: Do not apply any deodorants/lotions. Please wear clean clothes to the hospital/surgery center.  Please read over the following fact sheets that you were given. Pain Booklet, Coughing and Deep Breathing, MRSA Information and Surgical Site Infection Prevention

## 2016-09-19 ENCOUNTER — Inpatient Hospital Stay (HOSPITAL_COMMUNITY)
Admission: RE | Admit: 2016-09-19 | Discharge: 2016-09-19 | Disposition: A | Discharge status: Home / Self Care | Payer: Medicaid Other | Source: Ambulatory Visit

## 2016-09-25 ENCOUNTER — Encounter (HOSPITAL_COMMUNITY): Payer: Self-pay

## 2016-09-25 ENCOUNTER — Encounter (HOSPITAL_COMMUNITY)
Admission: RE | Admit: 2016-09-25 | Discharge: 2016-09-25 | Disposition: A | Discharge status: Home / Self Care | Payer: Medicaid Other | Source: Ambulatory Visit | Attending: Neurological Surgery | Admitting: Neurological Surgery

## 2016-09-25 DIAGNOSIS — Z01812 Encounter for preprocedural laboratory examination: Secondary | ICD-10-CM | POA: Diagnosis present

## 2016-09-25 HISTORY — DX: Encounter for elective termination of pregnancy: Z33.2

## 2016-09-25 HISTORY — DX: Pure hypercholesterolemia, unspecified: E78.00

## 2016-09-25 LAB — CBC
HCT: 36.2 % (ref 36.0–46.0)
Hemoglobin: 11.8 g/dL — ABNORMAL LOW (ref 12.0–15.0)
MCH: 27.4 pg (ref 26.0–34.0)
MCHC: 32.6 g/dL (ref 30.0–36.0)
MCV: 84.2 fL (ref 78.0–100.0)
PLATELETS: 244 10*3/uL (ref 150–400)
RBC: 4.3 MIL/uL (ref 3.87–5.11)
RDW: 15.9 % — AB (ref 11.5–15.5)
WBC: 11.7 10*3/uL — AB (ref 4.0–10.5)

## 2016-09-25 LAB — SURGICAL PCR SCREEN
MRSA, PCR: NEGATIVE
Staphylococcus aureus: NEGATIVE

## 2016-09-25 LAB — COMPREHENSIVE METABOLIC PANEL
ALK PHOS: 72 U/L (ref 38–126)
ALT: 14 U/L (ref 14–54)
ANION GAP: 10 (ref 5–15)
AST: 20 U/L (ref 15–41)
Albumin: 3.5 g/dL (ref 3.5–5.0)
BUN: 8 mg/dL (ref 6–20)
CALCIUM: 8.8 mg/dL — AB (ref 8.9–10.3)
CHLORIDE: 109 mmol/L (ref 101–111)
CO2: 19 mmol/L — ABNORMAL LOW (ref 22–32)
Creatinine, Ser: 0.89 mg/dL (ref 0.44–1.00)
GFR calc Af Amer: >60 mL/min (ref >60)
Glucose, Bld: 72 mg/dL (ref 65–99)
Potassium: 3.3 mmol/L — ABNORMAL LOW (ref 3.5–5.1)
SODIUM: 138 mmol/L (ref 135–145)
Total Bilirubin: 0.7 mg/dL (ref 0.3–1.2)
Total Protein: 6.6 g/dL (ref 6.5–8.1)

## 2016-09-25 LAB — HCG, QUANTITATIVE, PREGNANCY: HCG, BETA CHAIN, QUANT, S: 24 m[IU]/mL — AB (ref <5)

## 2016-09-25 NOTE — Progress Notes (Signed)
PCP - [facility] Cardiologist - denies  Chest x-ray - not needed EKG - 07/21/16 Stress Test - denies ECHO - denies Cardiac Cath -denies   Patient had recent pregnancy which was ended with an abortion early June.   Allison/Angela made aware of this  Last Cocaine use 09/23/16 Last Mariajuana use 09/24/16 Instructed patient to not use anymore of either of these before surgery    Patient denies shortness of breath, fever, cough and chest pain at PAT appointment   Patient verbalized understanding of instructions that were given to them at the PAT appointment. Patient was also instructed that they will need to review over the PAT instructions again at home before surgery.
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
## 2016-09-25 NOTE — Progress Notes (Signed)
   09/25/16 1031  [place]  Have you ever been diagnosed with sleep apnea through a sleep study? No  Do you snore loudly (loud enough to be heard through closed doors)?  1  Do you often feel tired, fatigued, or sleepy during the daytime (such as falling asleep during driving or talking to someone)? 1  Has anyone observed you stop breathing during your sleep? 1  Do you have, or are you being treated for high blood pressure? 0  BMI more than 35 kg/m2? 1  Age > 50 (1-yes) 0  Neck circumference greater than:Female 16 inches or larger, Female 17inches or larger? 1  Female Gender (Yes=1) 0  Obstructive Sleep Apnea Score 5  Score 5 or greater  Results sent to PCP

## 2016-09-28 ENCOUNTER — Observation Stay (HOSPITAL_COMMUNITY)
Admission: RE | Admit: 2016-09-28 | Discharge: 2016-09-29 | Disposition: A | Discharge status: Home / Self Care | Payer: Medicaid Other | Source: Ambulatory Visit | Attending: Neurological Surgery | Admitting: Neurological Surgery

## 2016-09-28 ENCOUNTER — Ambulatory Visit (HOSPITAL_COMMUNITY): Payer: Medicaid Other | Admitting: Vascular Surgery

## 2016-09-28 ENCOUNTER — Ambulatory Visit (HOSPITAL_COMMUNITY): Payer: Medicaid Other

## 2016-09-28 ENCOUNTER — Encounter (HOSPITAL_COMMUNITY)
Admission: RE | Disposition: A | Discharge status: Home / Self Care | Payer: Self-pay | Source: Ambulatory Visit | Attending: Neurological Surgery

## 2016-09-28 ENCOUNTER — Encounter (HOSPITAL_COMMUNITY): Payer: Self-pay | Admitting: *Deleted

## 2016-09-28 ENCOUNTER — Ambulatory Visit (HOSPITAL_COMMUNITY): Payer: Medicaid Other | Admitting: Anesthesiology

## 2016-09-28 DIAGNOSIS — Z91040 Latex allergy status: Secondary | ICD-10-CM | POA: Diagnosis not present

## 2016-09-28 DIAGNOSIS — F17210 Nicotine dependence, cigarettes, uncomplicated: Secondary | ICD-10-CM | POA: Insufficient documentation

## 2016-09-28 DIAGNOSIS — M5117 Intervertebral disc disorders with radiculopathy, lumbosacral region: Secondary | ICD-10-CM | POA: Diagnosis present

## 2016-09-28 DIAGNOSIS — J45909 Unspecified asthma, uncomplicated: Secondary | ICD-10-CM | POA: Diagnosis not present

## 2016-09-28 DIAGNOSIS — Z888 Allergy status to other drugs, medicaments and biological substances status: Secondary | ICD-10-CM | POA: Insufficient documentation

## 2016-09-28 DIAGNOSIS — Z8541 Personal history of malignant neoplasm of cervix uteri: Secondary | ICD-10-CM | POA: Insufficient documentation

## 2016-09-28 DIAGNOSIS — Z79899 Other long term (current) drug therapy: Secondary | ICD-10-CM | POA: Diagnosis not present

## 2016-09-28 DIAGNOSIS — K219 Gastro-esophageal reflux disease without esophagitis: Secondary | ICD-10-CM | POA: Diagnosis not present

## 2016-09-28 DIAGNOSIS — F419 Anxiety disorder, unspecified: Secondary | ICD-10-CM | POA: Diagnosis not present

## 2016-09-28 DIAGNOSIS — F319 Bipolar disorder, unspecified: Secondary | ICD-10-CM | POA: Insufficient documentation

## 2016-09-28 DIAGNOSIS — Z9102 Food additives allergy status: Secondary | ICD-10-CM | POA: Insufficient documentation

## 2016-09-28 DIAGNOSIS — Z419 Encounter for procedure for purposes other than remedying health state, unspecified: Secondary | ICD-10-CM

## 2016-09-28 HISTORY — PX: LUMBAR LAMINECTOMY/DECOMPRESSION MICRODISCECTOMY: SHX5026

## 2016-09-28 SURGERY — LUMBAR LAMINECTOMY/DECOMPRESSION MICRODISCECTOMY 1 LEVEL
Anesthesia: General | Site: Back | Laterality: Left

## 2016-09-28 MED ORDER — ALBUTEROL SULFATE (2.5 MG/3ML) 0.083% IN NEBU: 2.5000 mg | INHALATION_SOLUTION | Freq: Four times a day (QID) | RESPIRATORY_TRACT | Status: DC | PRN

## 2016-09-28 MED ORDER — FENTANYL CITRATE (PF) 250 MCG/5ML IJ SOLN
INTRAMUSCULAR | Status: AC
  Filled 2016-09-28: qty 5

## 2016-09-28 MED ORDER — MENTHOL 3 MG MT LOZG: 1.0000 | LOZENGE | OROMUCOSAL | Status: DC | PRN

## 2016-09-28 MED ORDER — PHENOL 1.4 % MT LIQD
1.0000 | OROMUCOSAL | Status: DC | PRN
  Administered 2016-09-29: 1 via OROMUCOSAL
  Filled 2016-09-28: qty 177

## 2016-09-28 MED ORDER — ONDANSETRON HCL 4 MG/2ML IJ SOLN: 4.0000 mg | Freq: Four times a day (QID) | INTRAMUSCULAR | Status: DC | PRN

## 2016-09-28 MED ORDER — ACETAMINOPHEN 325 MG PO TABS: 650.0000 mg | ORAL_TABLET | ORAL | Status: DC | PRN

## 2016-09-28 MED ORDER — DOCUSATE SODIUM 100 MG PO CAPS
100.0000 mg | ORAL_CAPSULE | Freq: Two times a day (BID) | ORAL | Status: DC
  Administered 2016-09-28: 100 mg via ORAL
  Filled 2016-09-28: qty 1

## 2016-09-28 MED ORDER — ONDANSETRON HCL 4 MG/2ML IJ SOLN
INTRAMUSCULAR | Status: AC
  Filled 2016-09-28: qty 4

## 2016-09-28 MED ORDER — OXYCODONE HCL 5 MG/5ML PO SOLN: 5.0000 mg | Freq: Once | ORAL | Status: AC | PRN

## 2016-09-28 MED ORDER — FAMOTIDINE 20 MG PO TABS
10.0000 mg | ORAL_TABLET | Freq: Every day | ORAL | Status: DC
Start: 2016-09-28 — End: 2016-09-29
  Administered 2016-09-28: 10 mg via ORAL
  Filled 2016-09-28: qty 1

## 2016-09-28 MED ORDER — ALBUTEROL SULFATE HFA 108 (90 BASE) MCG/ACT IN AERS: 2.0000 | INHALATION_SPRAY | Freq: Four times a day (QID) | RESPIRATORY_TRACT | Status: DC | PRN

## 2016-09-28 MED ORDER — OXYCODONE HCL 5 MG PO TABS
5.0000 mg | ORAL_TABLET | Freq: Once | ORAL | Status: AC | PRN
  Administered 2016-09-28: 5 mg via ORAL

## 2016-09-28 MED ORDER — THROMBIN 5000 UNITS EX SOLR
CUTANEOUS | Status: AC
  Filled 2016-09-28: qty 10000

## 2016-09-28 MED ORDER — DEXAMETHASONE SODIUM PHOSPHATE 10 MG/ML IJ SOLN
INTRAMUSCULAR | Status: DC | PRN
  Administered 2016-09-28: 10 mg via INTRAVENOUS

## 2016-09-28 MED ORDER — GLYCOPYRROLATE 0.2 MG/ML IJ SOLN
INTRAMUSCULAR | Status: DC | PRN
  Administered 2016-09-28: 0.2 mg via INTRAVENOUS
  Administered 2016-09-28: .4 mg via INTRAVENOUS
  Administered 2016-09-28: 0.2 mg via INTRAVENOUS

## 2016-09-28 MED ORDER — LIDOCAINE-EPINEPHRINE 1 %-1:100000 IJ SOLN
INTRAMUSCULAR | Status: DC | PRN
  Administered 2016-09-28: 10 mL

## 2016-09-28 MED ORDER — BISACODYL 10 MG RE SUPP
10.0000 mg | Freq: Every day | RECTAL | Status: DC | PRN
Start: 2016-09-28 — End: 2016-09-29

## 2016-09-28 MED ORDER — CHLORHEXIDINE GLUCONATE CLOTH 2 % EX PADS: 6.0000 | MEDICATED_PAD | Freq: Once | CUTANEOUS | Status: DC

## 2016-09-28 MED ORDER — MIDAZOLAM HCL 5 MG/5ML IJ SOLN
INTRAMUSCULAR | Status: DC | PRN
  Administered 2016-09-28: 2 mg via INTRAVENOUS

## 2016-09-28 MED ORDER — THROMBIN 5000 UNITS EX SOLR
CUTANEOUS | Status: DC | PRN
  Administered 2016-09-28 (×2): 5000 [IU] via TOPICAL

## 2016-09-28 MED ORDER — SENNA 8.6 MG PO TABS
1.0000 | ORAL_TABLET | Freq: Two times a day (BID) | ORAL | Status: DC
  Administered 2016-09-28: 8.6 mg via ORAL
  Filled 2016-09-28: qty 1

## 2016-09-28 MED ORDER — ROCURONIUM BROMIDE 50 MG/5ML IV SOLN
INTRAVENOUS | Status: AC
  Filled 2016-09-28: qty 3

## 2016-09-28 MED ORDER — HEMOSTATIC AGENTS (NO CHARGE) OPTIME
TOPICAL | Status: DC | PRN
  Administered 2016-09-28: 1 via TOPICAL

## 2016-09-28 MED ORDER — KETOROLAC TROMETHAMINE 15 MG/ML IJ SOLN
INTRAMUSCULAR | Status: AC
  Filled 2016-09-28: qty 1

## 2016-09-28 MED ORDER — HYDROMORPHONE HCL 1 MG/ML IJ SOLN
INTRAMUSCULAR | Status: AC
  Administered 2016-09-28: 0.5 mg via INTRAVENOUS
  Filled 2016-09-28: qty 1

## 2016-09-28 MED ORDER — LACTATED RINGERS IV SOLN
INTRAVENOUS | Status: DC
  Administered 2016-09-28 (×2): via INTRAVENOUS

## 2016-09-28 MED ORDER — HYDROCODONE-ACETAMINOPHEN 5-325 MG PO TABS
1.0000 | ORAL_TABLET | ORAL | Status: DC | PRN
  Administered 2016-09-28 – 2016-09-29 (×3): 2 via ORAL
  Filled 2016-09-28 (×3): qty 2

## 2016-09-28 MED ORDER — DEXMEDETOMIDINE HCL 200 MCG/2ML IV SOLN
INTRAVENOUS | Status: DC | PRN
  Administered 2016-09-28 (×2): 12 ug via INTRAVENOUS
  Administered 2016-09-28 (×2): 8 ug via INTRAVENOUS

## 2016-09-28 MED ORDER — HYDROMORPHONE HCL 1 MG/ML IJ SOLN
INTRAMUSCULAR | Status: AC
  Administered 2016-09-28: 0.5 mg via INTRAVENOUS
  Filled 2016-09-28: qty 0.5

## 2016-09-28 MED ORDER — OXYCODONE HCL 5 MG PO TABS
ORAL_TABLET | ORAL | Status: AC
Start: 2016-09-28 — End: 2016-09-29
  Filled 2016-09-28: qty 1

## 2016-09-28 MED ORDER — DEXAMETHASONE SODIUM PHOSPHATE 10 MG/ML IJ SOLN
INTRAMUSCULAR | Status: AC
  Filled 2016-09-28: qty 2

## 2016-09-28 MED ORDER — CEFAZOLIN SODIUM-DEXTROSE 2-4 GM/100ML-% IV SOLN
2.0000 g | INTRAVENOUS | Status: AC
  Administered 2016-09-28: 2 g via INTRAVENOUS
  Filled 2016-09-28: qty 100

## 2016-09-28 MED ORDER — BUPIVACAINE HCL (PF) 0.5 % IJ SOLN
INTRAMUSCULAR | Status: DC | PRN
  Administered 2016-09-28: 10 mL

## 2016-09-28 MED ORDER — FLEET ENEMA 7-19 GM/118ML RE ENEM: 1.0000 | ENEMA | Freq: Once | RECTAL | Status: DC | PRN

## 2016-09-28 MED ORDER — ACETAMINOPHEN 650 MG RE SUPP
650.0000 mg | RECTAL | Status: DC | PRN
Start: 2016-09-28 — End: 2016-09-29

## 2016-09-28 MED ORDER — LIDOCAINE HCL (CARDIAC) 20 MG/ML IV SOLN
INTRAVENOUS | Status: DC | PRN
  Administered 2016-09-28: 100 mg via INTRATRACHEAL

## 2016-09-28 MED ORDER — DEXMEDETOMIDINE HCL IN NACL 200 MCG/50ML IV SOLN
INTRAVENOUS | Status: AC
  Filled 2016-09-28: qty 50

## 2016-09-28 MED ORDER — 0.9 % SODIUM CHLORIDE (POUR BTL) OPTIME
TOPICAL | Status: DC | PRN
  Administered 2016-09-28: 1000 mL

## 2016-09-28 MED ORDER — ONDANSETRON HCL 4 MG/2ML IJ SOLN
INTRAMUSCULAR | Status: DC | PRN
  Administered 2016-09-28: 4 mg via INTRAVENOUS

## 2016-09-28 MED ORDER — EPHEDRINE SULFATE 50 MG/ML IJ SOLN
INTRAMUSCULAR | Status: DC | PRN
  Administered 2016-09-28: 10 mg via INTRAVENOUS

## 2016-09-28 MED ORDER — SODIUM CHLORIDE 0.9 % IV SOLN: 250.0000 mL | INTRAVENOUS | Status: DC

## 2016-09-28 MED ORDER — PROPOFOL 10 MG/ML IV BOLUS
INTRAVENOUS | Status: DC | PRN
  Administered 2016-09-28: 200 mg via INTRAVENOUS

## 2016-09-28 MED ORDER — POLYETHYLENE GLYCOL 3350 17 G PO PACK: 17.0000 g | PACK | Freq: Every day | ORAL | Status: DC | PRN

## 2016-09-28 MED ORDER — ALUM & MAG HYDROXIDE-SIMETH 200-200-20 MG/5ML PO SUSP: 30.0000 mL | Freq: Four times a day (QID) | ORAL | Status: DC | PRN

## 2016-09-28 MED ORDER — HYDROMORPHONE HCL 1 MG/ML IJ SOLN: 0.5000 mg | INTRAMUSCULAR | Status: DC | PRN

## 2016-09-28 MED ORDER — SODIUM CHLORIDE 0.9% FLUSH
3.0000 mL | Freq: Two times a day (BID) | INTRAVENOUS | Status: DC
  Administered 2016-09-28: 3 mL via INTRAVENOUS

## 2016-09-28 MED ORDER — ROCURONIUM BROMIDE 100 MG/10ML IV SOLN
INTRAVENOUS | Status: DC | PRN
  Administered 2016-09-28: 50 mg via INTRAVENOUS

## 2016-09-28 MED ORDER — SODIUM CHLORIDE 0.9% FLUSH: 3.0000 mL | INTRAVENOUS | Status: DC | PRN

## 2016-09-28 MED ORDER — LIDOCAINE-EPINEPHRINE 1 %-1:100000 IJ SOLN
INTRAMUSCULAR | Status: AC
  Filled 2016-09-28: qty 1

## 2016-09-28 MED ORDER — METHOCARBAMOL 1000 MG/10ML IJ SOLN: 500.0000 mg | Freq: Four times a day (QID) | INTRAVENOUS | Status: DC | PRN

## 2016-09-28 MED ORDER — NEOSTIGMINE METHYLSULFATE 10 MG/10ML IV SOLN
INTRAVENOUS | Status: DC | PRN
  Administered 2016-09-28: 2.5 mg via INTRAVENOUS

## 2016-09-28 MED ORDER — SODIUM CHLORIDE 0.9 % IR SOLN
Status: DC | PRN
  Administered 2016-09-28: 14:00:00

## 2016-09-28 MED ORDER — BUPIVACAINE HCL (PF) 0.5 % IJ SOLN
INTRAMUSCULAR | Status: AC
  Filled 2016-09-28: qty 30

## 2016-09-28 MED ORDER — KETOROLAC TROMETHAMINE 15 MG/ML IJ SOLN
15.0000 mg | Freq: Four times a day (QID) | INTRAMUSCULAR | Status: DC
  Administered 2016-09-28 – 2016-09-29 (×3): 15 mg via INTRAVENOUS
  Filled 2016-09-28 (×2): qty 1

## 2016-09-28 MED ORDER — DIPHENHYDRAMINE-APAP (SLEEP) 25-500 MG PO TABS: 2.0000 | ORAL_TABLET | Freq: Every evening | ORAL | Status: DC | PRN

## 2016-09-28 MED ORDER — HYDROMORPHONE HCL 1 MG/ML IJ SOLN
INTRAMUSCULAR | Status: AC
  Filled 2016-09-28: qty 0.5

## 2016-09-28 MED ORDER — EPHEDRINE 5 MG/ML INJ
INTRAVENOUS | Status: AC
  Filled 2016-09-28: qty 20

## 2016-09-28 MED ORDER — HYDROMORPHONE HCL 1 MG/ML IJ SOLN
0.2500 mg | INTRAMUSCULAR | Status: DC | PRN
  Administered 2016-09-28 (×4): 0.5 mg via INTRAVENOUS

## 2016-09-28 MED ORDER — PANTOPRAZOLE SODIUM 40 MG PO TBEC
40.0000 mg | DELAYED_RELEASE_TABLET | Freq: Every day | ORAL | Status: DC
  Administered 2016-09-28: 40 mg via ORAL
  Filled 2016-09-28: qty 1

## 2016-09-28 MED ORDER — FENTANYL CITRATE (PF) 250 MCG/5ML IJ SOLN
INTRAMUSCULAR | Status: DC | PRN
  Administered 2016-09-28 (×3): 100 ug via INTRAVENOUS
  Administered 2016-09-28: 150 ug via INTRAVENOUS
  Administered 2016-09-28: 50 ug via INTRAVENOUS

## 2016-09-28 MED ORDER — LIDOCAINE HCL (CARDIAC) 20 MG/ML IV SOLN
INTRAVENOUS | Status: AC
  Filled 2016-09-28: qty 10

## 2016-09-28 MED ORDER — METHOCARBAMOL 500 MG PO TABS
500.0000 mg | ORAL_TABLET | Freq: Four times a day (QID) | ORAL | Status: DC | PRN
  Administered 2016-09-28: 500 mg via ORAL
  Filled 2016-09-28: qty 1

## 2016-09-28 MED ORDER — ONDANSETRON HCL 4 MG PO TABS: 4.0000 mg | ORAL_TABLET | Freq: Four times a day (QID) | ORAL | Status: DC | PRN

## 2016-09-28 MED ORDER — MIDAZOLAM HCL 2 MG/2ML IJ SOLN
INTRAMUSCULAR | Status: AC
  Filled 2016-09-28: qty 2

## 2016-09-28 MED ORDER — SUGAMMADEX SODIUM 200 MG/2ML IV SOLN
INTRAVENOUS | Status: AC
  Filled 2016-09-28: qty 4

## 2016-09-28 SURGICAL SUPPLY — 52 items
BAG DECANTER FOR FLEXI CONT (MISCELLANEOUS) ×3 IMPLANT
BLADE CLIPPER SURG (BLADE) IMPLANT
BUR ACORN 6.0 (BURR) ×2 IMPLANT
BUR ACORN 6.0MM (BURR) ×1
BUR MATCHSTICK NEURO 3.0 LAGG (BURR) ×3 IMPLANT
CANISTER SUCT 3000ML PPV (MISCELLANEOUS) ×3 IMPLANT
CARTRIDGE OIL MAESTRO DRILL (MISCELLANEOUS) ×1 IMPLANT
DECANTER SPIKE VIAL GLASS SM (MISCELLANEOUS) ×3 IMPLANT
DERMABOND ADVANCED (GAUZE/BANDAGES/DRESSINGS) ×2
DERMABOND ADVANCED .7 DNX12 (GAUZE/BANDAGES/DRESSINGS) ×1 IMPLANT
DEVICE DISSECT PLASMABLAD 3.0S (MISCELLANEOUS) ×1 IMPLANT
DIFFUSER DRILL AIR PNEUMATIC (MISCELLANEOUS) ×3 IMPLANT
DRAPE HALF SHEET 40X57 (DRAPES) IMPLANT
DRAPE LAPAROTOMY T 102X78X121 (DRAPES) ×3 IMPLANT
DRAPE MICROSCOPE LEICA (MISCELLANEOUS) ×3 IMPLANT
DRAPE POUCH INSTRU U-SHP 10X18 (DRAPES) ×3 IMPLANT
DRSG OPSITE POSTOP 3X4 (GAUZE/BANDAGES/DRESSINGS) ×3 IMPLANT
DURAPREP 26ML APPLICATOR (WOUND CARE) ×3 IMPLANT
ELECT REM PT RETURN 9FT ADLT (ELECTROSURGICAL) ×3
ELECTRODE REM PT RTRN 9FT ADLT (ELECTROSURGICAL) ×1 IMPLANT
GAUZE SPONGE 4X4 12PLY STRL (GAUZE/BANDAGES/DRESSINGS) ×3 IMPLANT
GAUZE SPONGE 4X4 16PLY XRAY LF (GAUZE/BANDAGES/DRESSINGS) IMPLANT
GLOVE BIOGEL PI IND STRL 8.5 (GLOVE) ×1 IMPLANT
GLOVE BIOGEL PI INDICATOR 8.5 (GLOVE) ×2
GLOVE ECLIPSE 8.5 STRL (GLOVE) IMPLANT
GLOVE SURG SS PI 8.0 STRL IVOR (GLOVE) ×9 IMPLANT
GLOVE SURG SS PI 8.5 STRL IVOR (GLOVE) ×4
GLOVE SURG SS PI 8.5 STRL STRW (GLOVE) ×2 IMPLANT
GOWN STRL REUS W/ TWL LRG LVL3 (GOWN DISPOSABLE) ×1 IMPLANT
GOWN STRL REUS W/ TWL XL LVL3 (GOWN DISPOSABLE) ×2 IMPLANT
GOWN STRL REUS W/TWL 2XL LVL3 (GOWN DISPOSABLE) ×3 IMPLANT
GOWN STRL REUS W/TWL LRG LVL3 (GOWN DISPOSABLE) ×2
GOWN STRL REUS W/TWL XL LVL3 (GOWN DISPOSABLE) ×4
KIT BASIN OR (CUSTOM PROCEDURE TRAY) ×3 IMPLANT
KIT ROOM TURNOVER OR (KITS) ×3 IMPLANT
NEEDLE HYPO 22GX1.5 SAFETY (NEEDLE) ×3 IMPLANT
NEEDLE SPNL 20GX3.5 QUINCKE YW (NEEDLE) ×3 IMPLANT
NS IRRIG 1000ML POUR BTL (IV SOLUTION) ×3 IMPLANT
OIL CARTRIDGE MAESTRO DRILL (MISCELLANEOUS) ×3
PACK LAMINECTOMY NEURO (CUSTOM PROCEDURE TRAY) ×3 IMPLANT
PAD ARMBOARD 7.5X6 YLW CONV (MISCELLANEOUS) ×9 IMPLANT
PATTIES SURGICAL .5 X1 (DISPOSABLE) ×3 IMPLANT
PLASMABLADE 3.0S (MISCELLANEOUS) ×3
RUBBERBAND STERILE (MISCELLANEOUS) ×6 IMPLANT
SPONGE SURGIFOAM ABS GEL SZ50 (HEMOSTASIS) ×3 IMPLANT
SUT VIC AB 1 CT1 18XBRD ANBCTR (SUTURE) ×1 IMPLANT
SUT VIC AB 1 CT1 8-18 (SUTURE) ×2
SUT VIC AB 2-0 CP2 18 (SUTURE) ×3 IMPLANT
SUT VIC AB 3-0 SH 8-18 (SUTURE) ×3 IMPLANT
TOWEL GREEN STERILE (TOWEL DISPOSABLE) ×3 IMPLANT
TOWEL GREEN STERILE FF (TOWEL DISPOSABLE) ×3 IMPLANT
WATER STERILE IRR 1000ML POUR (IV SOLUTION) ×3 IMPLANT

## 2016-09-28 NOTE — H&P (Addendum)
[name] is an 31 y.o. female.   Chief Complaint: Back and left leg pain HPI: [name] is a 31 year old individual who's had previous discectomy at L4-5 on the left secondary to a large herniated nucleus pulposus. This was back in 2016. She has since recurred a large disc herniation now at L5-S1 eccentric to the left side also she has recurrent left lower extremity pain. She is advised regarding the need for surgery and is now admitted to undergo microdiscectomy L5-S1 on the left.  Past Medical History:  Diagnosis Date  . Abortion in first trimester 08/2016  . Anemia   . Anxiety    Panic attack  . Arthritis   . Asthma   . Bipolar 1 disorder ([place])   . Cancer ([place])   . Cervical cancer ([place])   . Chronic kidney disease    history of kidney shut down  no current problems  . Constipation   . DDD (degenerative disc disease), cervical   . Edema   . Gallstones   . GERD (gastroesophageal reflux disease)   . High cholesterol   . History of anemia   . History of renal failure   . HPV (human papilloma virus) anogenital infection   . Leukocytosis    going to hematologist  . Migraine   . Obesity   . Obesity   . Pelvic inflammatory disease (PID) 05-08-11   previous hx. .Hx. childbirth x3-NVD  . PID (pelvic inflammatory disease)   . PTSD (post-traumatic stress disorder)   . Sciatica   . Scoliosis     Past Surgical History:  Procedure Laterality Date  . ABDOMINAL EXPLORATION SURGERY  approx 31 years old   rlq(due to adhesions around ovaries)-appendix and ovaries remain  . ANTERIOR CERVICAL DECOMP/DISCECTOMY FUSION N/A 12/09/2012   Procedure: ANTERIOR CERVICAL DECOMPRESSION/DISCECTOMY FUSION STRUCTURAL ALLOGRAFT TRESTLE PLATE CERVICAL FIVE-SIX,SIX-SEVEN;  Surgeon: Otilio Connors, MD;  Location: [facility];  Service: Neurosurgery;  Laterality: N/A;  . CHOLECYSTECTOMY  05/10/2011   Procedure: LAPAROSCOPIC CHOLECYSTECTOMY WITH INTRAOPERATIVE CHOLANGIOGRAM;  Surgeon: Gayland Curry, MD,FACS;  Location: [facility];  Service: General;  Laterality: N/A;  . LUMBAR LAMINECTOMY/DECOMPRESSION MICRODISCECTOMY Left 11/30/2014   Procedure: Left lumbar four-five Microdiskectomy;  Surgeon: Kristeen Miss, MD;  Location: [place] [facility];  Service: Neurosurgery;  Laterality: Left;  . LUMBAR LAMINECTOMY/DECOMPRESSION MICRODISCECTOMY Left 08/15/2015   Procedure: Left Lumbar four-five Microdiskectomy;  Surgeon: Kristeen Miss, MD;  Location: [place] [facility];  Service: Neurosurgery;  Laterality: Left;    Family History  Problem Relation Age of Onset  . Diabetes Mother   . Endometriosis Mother   . Cancer Mother        lung  . Diabetes Father   . Heart disease Father   . Prostate cancer Maternal Grandfather   . Cancer Maternal Grandfather        prostate  . Colon polyps Maternal Grandmother   . Cirrhosis Paternal Grandfather    Social History:  reports that she has been smoking Cigarettes.  She has a 12.00 pack-year smoking history. She has never used smokeless tobacco. She reports that she drinks alcohol. She reports that she uses drugs, including Marijuana and Codeine.  Allergies:  Allergies  Allergen Reactions  . Banana Other (See Comments)      Itchy throat  . Latex Itching and Rash    Red spots  . Orange Fruit [Citrus]     Mouth pain, acid reflux   . Miconazole Rash and Other (See Comments)  Paradoxical effect: increases yeast    Medications Prior to Admission  Medication Sig Dispense Refill  . Diphenhydramine-APAP, sleep, (TYLENOL PM EXTRA STRENGTH PO) Take 2 tablets by mouth at bedtime as needed (sleep).    Marland Kitchen esomeprazole (NEXIUM) 40 MG capsule Take 40 mg by mouth 2 (two) times daily.    Marland Kitchen ibuprofen (ADVIL,MOTRIN) 800 MG tablet Take 800 mg by mouth every 8 (eight) hours as needed for mild pain or moderate pain.    Marland Kitchen lurasidone (LATUDA) 20 MG TABS tablet Take 20 mg by mouth daily.    . ranitidine (ZANTAC) 150 MG tablet Take 150 mg by mouth 2 (two) times daily.    Marland Kitchen  albuterol (PROAIR HFA) 108 (90 Base) MCG/ACT inhaler Inhale 2 puffs into the lungs every 6 (six) hours as needed for wheezing or shortness of breath.      No results found for this or any previous visit (from the past 48 hour(s)). No results found.  Review of Systems  HENT: Negative.   Eyes: Negative.   Respiratory: Negative.   Cardiovascular: Negative.   Gastrointestinal: Negative.   Genitourinary: Negative.   Musculoskeletal: Positive for back pain.  Skin: Negative.   Neurological: Positive for weakness.       Left lower extremity pain and weakness radiating from the calf down to the bottom of the foot  Endo/Heme/Allergies: Negative.   Psychiatric/Behavioral: Negative.     Blood pressure 123/73, pulse (!) 48, temperature 98.2 F (36.8 C), temperature source Oral, resp. rate 20, weight 93.4 kg (206 lb), last menstrual period 05/04/2016, SpO2 100 %, unknown if currently breastfeeding. Physical Exam  Constitutional: She is oriented to person, place, and time. She appears well-developed and well-nourished.  HENT:  Head: Normocephalic and atraumatic.  Eyes: Conjunctivae and EOM are normal. Pupils are equal, round, and reactive to light.  Neck: Normal range of motion. Neck supple.  Cardiovascular: Normal rate and regular rhythm.   Respiratory: Effort normal and breath sounds normal.  GI: Soft. Bowel sounds are normal.  Musculoskeletal:  Positive straight leg raising on the left at 15 negative on the right side to 35 at which point she gets left leg pain  Neurological: She is alert and oriented to person, place, and time.  Absent patellar and Achilles reflex on the left. Weakness in the gastroc on the left side of 4 out of 5.  Skin: Skin is warm and dry.  Psychiatric: She has a normal mood and affect. Her behavior is normal. Judgment and thought content normal.     Assessment/Plan Herniated nucleus pulposus L5-S1 left, left lumbar radiculopathy.  Microdiscectomy L5-S1 left  with decompression of the S1 nerve root.  Earleen Newport, MD 09/28/2016, 2:00 PM

## 2016-09-28 NOTE — Anesthesia Procedure Notes (Signed)
Procedure Name: Intubation Date/Time: 09/28/2016 2:33 PM Performed by: Mariea Clonts Pre-anesthesia Checklist: Patient identified, Emergency Drugs available, Suction available and Patient being monitored Patient Re-evaluated:Patient Re-evaluated prior to inductionOxygen Delivery Method: Circle System Utilized Preoxygenation: Pre-oxygenation with 100% oxygen Intubation Type: IV induction Ventilation: Mask ventilation without difficulty Laryngoscope Size: Miller and 2 Grade View: Grade I Tube type: Oral Tube size: 7.0 mm Number of attempts: 1 Airway Equipment and Method: Stylet and Oral airway Placement Confirmation: ETT inserted through vocal cords under direct vision,  positive ETCO2 and breath sounds checked- equal and bilateral Tube secured with: Tape Dental Injury: Teeth and Oropharynx as per pre-operative assessment

## 2016-09-28 NOTE — Progress Notes (Signed)
Patient ID: [name], female   DOB: 10-23-1985, 31 y.o.   MRN: [id number] Vital signs are stable Motor function intact

## 2016-09-28 NOTE — Anesthesia Preprocedure Evaluation (Signed)
Anesthesia Evaluation  Patient identified by MRN, date of birth, ID band Patient awake    Reviewed: Allergy & Precautions, H&P , NPO status , Patient's Chart, lab work & pertinent test results  Airway Mallampati: II   Neck ROM: full    Dental   Pulmonary asthma , Current Smoker,    breath sounds clear to auscultation       Cardiovascular negative cardio ROS   Rhythm:regular Rate:Normal     Neuro/Psych  Headaches, PSYCHIATRIC DISORDERS Anxiety Bipolar Disorder    GI/Hepatic GERD  ,  Endo/Other  obese  Renal/GU      Musculoskeletal  (+) Arthritis ,   Abdominal   Peds  Hematology   Anesthesia Other Findings   Reproductive/Obstetrics                             Anesthesia Physical Anesthesia Plan  ASA: II  Anesthesia Plan: General   Post-op Pain Management:    Induction: Intravenous  PONV Risk Score and Plan: 3 and Ondansetron, Dexamethasone, Propofol and Midazolam  Airway Management Planned: Oral ETT  Additional Equipment:   Intra-op Plan:   Post-operative Plan: Extubation in OR  Informed Consent: I have reviewed the patients History and Physical, chart, labs and discussed the procedure including the risks, benefits and alternatives for the proposed anesthesia with the patient or authorized representative who has indicated his/her understanding and acceptance.     Plan Discussed with: CRNA, Anesthesiologist and Surgeon  Anesthesia Plan Comments:         Anesthesia Quick Evaluation

## 2016-09-28 NOTE — Transfer of Care (Signed)
Immediate Anesthesia Transfer of Care Note  Patient: [name]  Procedure(s) Performed: Procedure(s): LEFT LUMBAR FIVE -SACRAL ONE Microdiscectomy (Left)  Patient Location: PACU  Anesthesia Type:General  Level of Consciousness: awake, alert  and oriented  Airway & Oxygen Therapy: Patient Spontanous Breathing and Patient connected to nasal cannula oxygen  Post-op Assessment: Report given to RN, Post -op Vital signs reviewed and stable and Patient moving all extremities X 4  Post vital signs: Reviewed and stable  Last Vitals:  Vitals:   09/28/16 1317 09/28/16 1624  BP: 123/73 125/90  Pulse: (!) 48 70  Resp: 20 18  Temp: 36.8 C (!) 36.4 C    Last Pain:  Vitals:   09/28/16 1624  TempSrc:   PainSc: (P) 10-Worst pain ever      Patients Stated Pain Goal: 4 (48/54/62 7035)  Complications: No apparent anesthesia complications

## 2016-09-28 NOTE — Op Note (Signed)
Date of surgery: 09/28/2016 Preoperative diagnosis: Herniated nucleus pulposus L5-S1 left with left lumbar radiculopathy Postoperative diagnosis: Same Procedure: Microdiscectomy L5-S1 left with operating microscope dissection technique Surgeon: Kristeen Miss M.D. Assistant: Sherley Bounds M.D. Indications: Patient is a 31 year old individual's had several herniated nucleus pulposus in the past. She has immune herniated nucleus pulposus at L5-S1 on left side.  Procedure: The patient was brought to the operating room supine on a stretcher. After the smooth induction of general endotracheal anesthesia patient was carefully turned to the prone position with the bony prominences being appropriately padded and protected. The lower lumbar spine was prepped with alcohol and DuraPrep and then draped in a sterile fashion. The needle was used to localize the area of L5-S1 and a radiograph was obtained demonstrating a needle at the level of L5-S1. Skin was infiltrated with 10 cc of lidocaine 1-100,000 epinephrine mixed 50-50 with half percent Marcaine. An incision was made and carried down to the lumbar dorsal fascia the fascia was opened on the left side of the midline and a subperiosteal dissection was performed in the interlaminar space of L5-S1. A self-retaining retractor was then placed into the wound. The yellow ligament was taken up from the interlaminar space at L5-S1 and the inferior margin of the lamina of L5 was removed and a partial facetectomy was performed at L5-S1. The common dural tube was explored and the take off of the S1 nerve root was identified and gradually mobilized. There was a fragment of disc caudal to the disc space 9 the S1 vertebral body. This was mobilized and removed. The disc space was noted to have a calcific ligament.  The disc space was evacuated of a significant quantity of moderately degenerated disc material. Dissection was carried out under the use of the operating microscope with  the assistant providing traction and exposure and decompressing the lateral portion of the sac while I worked medially.  After adequate decompression was obtained hemostasis and the soft tissues was verified retractor was removed the operating microscope was removed from the field and the lumbar dorsal fascia was closed with #1 and 2-0 Vicryl in interrupted fashion. 3-0 Vicryl was used to close subcuticular skin. Blood loss was estimated at 50 mL.

## 2016-09-29 DIAGNOSIS — M5117 Intervertebral disc disorders with radiculopathy, lumbosacral region: Secondary | ICD-10-CM | POA: Diagnosis not present

## 2016-09-29 MED ORDER — DIAZEPAM 5 MG PO TABS: 5.0000 mg | ORAL_TABLET | Freq: Four times a day (QID) | ORAL | 0 refills | Status: DC | PRN

## 2016-09-29 MED ORDER — OXYCODONE-ACETAMINOPHEN 5-325 MG PO TABS: 1.0000 | ORAL_TABLET | ORAL | 0 refills | Status: DC | PRN

## 2016-09-29 MED ORDER — MELOXICAM 7.5 MG PO TABS: 7.5000 mg | ORAL_TABLET | Freq: Every day | ORAL | 3 refills | Status: DC

## 2016-09-29 NOTE — Progress Notes (Signed)
OT Cancellation Note and Discharge  Patient Details Name: [name] MRN: [id number] DOB: [birth year]   Cancelled Treatment:    Reason Eval/Treat Not Completed: OT screened, no needs identified, will sign off. Spoke to PT and no OT needs were identified.  Almon Register [id number] 09/29/2016, 9:32 AM

## 2016-09-29 NOTE — Progress Notes (Signed)
Pt given D/C instructions & Rx's with verbal understanding. Pt's incision is clean and dry with no sign of infection. Pt's IV was removed prior to D/C. Pt D/C'd home via wheelchair @ 0945 per MD order. Pt is stable @ D/C and has no other needs at this time. Holli Humbles, RN

## 2016-09-29 NOTE — Discharge Summary (Signed)
Physician Discharge Summary  Patient ID: [name] MRN: [id number] DOB/AGE: Dec 10, 1985 31 y.o.  Admit date: 09/28/2016 Discharge date: 09/29/2016  Admission Diagnoses:Herniated nucleus pulposus L5-S1 left with left lumbar radiculopathy  Discharge Diagnoses: Herniated nucleus pulposus L5-S1 left with left lumbar radiculopathy Active Problems:   Herniated nucleus pulposus   Discharged Condition: good  Hospital Course: Patient was admitted to undergo microdiscectomy L5-S1 left which she tolerated well.  Consults: None  Significant Diagnostic Studies: None  Treatments: surgery: Microdiscectomy L5-S1 left with operating microscope Michael dissection technique  Discharge Exam: Blood pressure 112/70, pulse (!) 52, temperature 97.8 F (36.6 C), temperature source Oral, resp. rate 18, weight 93.4 kg (206 lb), last menstrual period 05/04/2016, SpO2 100 %, unknown if currently breastfeeding. Incision is clean and dry, motor function is intact. Station and gait are intact.  Disposition: 01-Home or Self Care  Discharge Instructions    Call MD for:  redness, tenderness, or signs of infection (pain, swelling, redness, odor or green/yellow discharge around incision site)    Complete by:  As directed    Call MD for:  severe uncontrolled pain    Complete by:  As directed    Call MD for:  temperature >100.4    Complete by:  As directed    Diet - low sodium heart healthy    Complete by:  As directed    Discharge instructions    Complete by:  As directed    Okay to shower. Do not apply salves or appointments to incision. No heavy lifting with the upper extremities greater than 15 pounds. May resume driving when not requiring pain medication and patient feels comfortable with doing so.   Incentive spirometry RT    Complete by:  As directed    Increase activity slowly    Complete by:  As directed      Allergies as of 09/29/2016      Reactions   Banana Other (See Comments)     Itchy  throat   Latex Itching, Rash   Red spots   Orange Fruit [citrus]    Mouth pain, acid reflux    Miconazole Rash, Other (See Comments)   Paradoxical effect: increases yeast      Medication List    TAKE these medications   diazepam 5 MG tablet Commonly known as:  VALIUM Take 1 tablet (5 mg total) by mouth every 6 (six) hours as needed for muscle spasms.   esomeprazole 40 MG capsule Commonly known as:  NEXIUM Take 40 mg by mouth 2 (two) times daily.   ibuprofen 800 MG tablet Commonly known as:  ADVIL,MOTRIN Take 800 mg by mouth every 8 (eight) hours as needed for mild pain or moderate pain.   LATUDA 20 MG Tabs tablet Generic drug:  lurasidone Take 20 mg by mouth daily.   meloxicam 7.5 MG tablet Commonly known as:  MOBIC Take 1 tablet (7.5 mg total) by mouth daily.   oxyCODONE-acetaminophen 5-325 MG tablet Commonly known as:  ROXICET Take 1-2 tablets by mouth every 4 (four) hours as needed for severe pain.   PROAIR HFA 108 (90 Base) MCG/ACT inhaler Generic drug:  albuterol Inhale 2 puffs into the lungs every 6 (six) hours as needed for wheezing or shortness of breath.   ranitidine 150 MG tablet Commonly known as:  ZANTAC Take 150 mg by mouth 2 (two) times daily.   TYLENOL PM EXTRA STRENGTH PO Take 2 tablets by mouth at bedtime as needed (sleep).  SignedEarleen Newport 09/29/2016, 8:22 AM

## 2016-09-29 NOTE — Anesthesia Postprocedure Evaluation (Signed)
Anesthesia Post Note  Patient: [name]  Procedure(s) Performed: Procedure(s) (LRB): LEFT LUMBAR FIVE -SACRAL ONE Microdiscectomy (Left)     Patient location during evaluation: PACU Anesthesia Type: General Level of consciousness: awake and alert and patient cooperative Pain management: pain level controlled Vital Signs Assessment: post-procedure vital signs reviewed and stable Respiratory status: spontaneous breathing and respiratory function stable Cardiovascular status: stable Anesthetic complications: no    Last Vitals:  Vitals:   09/29/16 0500 09/29/16 0838  BP: 112/70 134/74  Pulse: (!) 52 (!) 52  Resp: 18 18  Temp: 36.6 C 36.6 C    Last Pain:  Vitals:   09/29/16 0800  TempSrc:   PainSc: 5    Pain Goal: Patients Stated Pain Goal: 3 (09/29/16 0800)               Darrelyn Morro S

## 2016-09-29 NOTE — Evaluation (Addendum)
Physical Therapy Evaluation Patient Details Name: [name] MRN: [id number] DOB: 1985-06-09 Today's Date: 09/29/2016   History of Present Illness  Pt is a 31 yo female admitted for L5-S1 microdiscectomy on 09/28/16. PMH significant for L4-L5 micodiscectomy 2016, anemia, anxiety, OA, bipolar, cervical CA, CKD, DDD.    Clinical Impression  Pt presents with the above diagnosis for therapy evaluation. Prior to admission, pt lived with her fiancee and children in a single level home. Pt was completely independent with all mobility. Pt is able to perform bed mobs, transfers and gait with Mod I to supervision and is expected to continue to recover independently without any acute PT follow-up. Pt is interested in attempting outpatient therapy once cleared by MD. Pt will benefit from core strengthening and LE strengthening once her incision is healed. Pt has had multiple back surgeries and no OT needs were identified at this time. OT was instructed that there were no acute needs.     Follow Up Recommendations Outpatient PT;Other (comment) (once cleared by surgeon)    Equipment Recommendations  None recommended by PT    Recommendations for Other Services       Precautions / Restrictions Precautions Precautions: Back Precaution Booklet Issued: Yes (comment) Precaution Comments: handout given and reviewed Restrictions Weight Bearing Restrictions: No      Mobility  Bed Mobility Overal bed mobility: Modified Independent             General bed mobility comments: cues for log roll  Transfers Overall transfer level: Modified independent Equipment used: None             General transfer comment: sit to stand from EOB and commode with Mod Independence  Ambulation/Gait Ambulation/Gait assistance: Supervision Ambulation Distance (Feet): 120 Feet Assistive device: None Gait Pattern/deviations: Step-through pattern;Decreased step length - right;Decreased step length -  left;Antalgic Gait velocity: decreased Gait velocity interpretation: Below normal speed for age/gender General Gait Details: moderate antalgic gait with cues for deep breathing in order to reduce pain. Cues for upright posture  Stairs            Wheelchair Mobility    Modified Rankin (Stroke Patients Only)       Balance Overall balance assessment: Modified Independent                                           Pertinent Vitals/Pain Pain Assessment: Faces Faces Pain Scale: Hurts whole lot Pain Location: low back and LLE Pain Descriptors / Indicators: Aching;Burning;Grimacing;Guarding Pain Intervention(s): Monitored during session;Premedicated before session;Repositioned    Home Living Family/patient expects to be discharged to:: Private residence Living Arrangements: Spouse/significant other;Children Available Help at Discharge: Family;Available 24 hours/day Type of Home: House       Home Layout: One level Home Equipment: Bedside commode;Shower seat      Prior Function Level of Independence: Independent         Comments: completely independent, no AD use     Hand Dominance   Dominant Hand: Right    Extremity/Trunk Assessment   Upper Extremity Assessment Upper Extremity Assessment: Overall WFL for tasks assessed    Lower Extremity Assessment Lower Extremity Assessment: Generalized weakness    Cervical / Trunk Assessment Cervical / Trunk Assessment: Normal  Communication   Communication: No difficulties  Cognition Arousal/Alertness: Awake/alert Behavior During Therapy: WFL for tasks assessed/performed Overall Cognitive Status: Within Functional Limits for tasks  assessed                                        General Comments      Exercises     Assessment/Plan    PT Assessment Patent does not need any further PT services  PT Problem List         PT Treatment Interventions      PT Goals (Current  goals can be found in the Care Plan section)  Acute Rehab PT Goals Patient Stated Goal: to get home and feel better PT Goal Formulation: All assessment and education complete, DC therapy Potential to Achieve Goals: Good    Frequency     Barriers to discharge        Co-evaluation               AM-PAC PT "6 Clicks" Daily Activity  Outcome Measure Difficulty turning over in bed (including adjusting bedclothes, sheets and blankets)?: None Difficulty moving from lying on back to sitting on the side of the bed? : None Difficulty sitting down on and standing up from a chair with arms (e.g., wheelchair, bedside commode, etc,.)?: None Help needed moving to and from a bed to chair (including a wheelchair)?: A Little Help needed walking in hospital room?: A Little Help needed climbing 3-5 steps with a railing? : A Little 6 Click Score: 21    End of Session Equipment Utilized During Treatment: Gait belt Activity Tolerance: Patient limited by pain Patient left: in chair;with call bell/phone within reach Nurse Communication: Mobility status PT Visit Diagnosis: Muscle weakness (generalized) (M62.81);Pain Pain - Right/Left:  (lower) Pain - part of body:  (back)    Time: 8016-5537 PT Time Calculation (min) (ACUTE ONLY): 24 min   Charges:   PT Evaluation $PT Eval Low Complexity: 1 Procedure PT Treatments $Gait Training: 8-22 mins   PT G Codes:   PT G-Codes **NOT FOR INPATIENT CLASS** Functional Assessment Tool Used: AM-PAC 6 Clicks Basic Mobility;Clinical judgement Functional Limitation: Mobility: Walking and moving around Mobility: Walking and Moving Around Current Status (S8270): At least 20 percent but less than 40 percent impaired, limited or restricted Mobility: Walking and Moving Around Goal Status [phone]): At least 20 percent but less than 40 percent impaired, limited or restricted Mobility: Walking and Moving Around Discharge Status [phone]): At least 20 percent but less  than 40 percent impaired, limited or restricted    Scheryl Marten PT, DPT  [phone]   Shanon Rosser 09/29/2016, 9:07 AM

## 2016-09-29 NOTE — Discharge Instructions (Signed)

## 2016-09-30 ENCOUNTER — Encounter (HOSPITAL_COMMUNITY): Payer: Self-pay | Admitting: Neurological Surgery

## 2016-10-14 ENCOUNTER — Emergency Department (HOSPITAL_COMMUNITY)
Admission: EM | Admit: 2016-10-14 | Discharge: 2016-10-14 | Disposition: A | Discharge status: Home / Self Care | Payer: Medicaid Other | Attending: Emergency Medicine | Admitting: Emergency Medicine

## 2016-10-14 ENCOUNTER — Encounter (HOSPITAL_COMMUNITY): Payer: Self-pay | Admitting: Emergency Medicine

## 2016-10-14 DIAGNOSIS — K645 Perianal venous thrombosis: Secondary | ICD-10-CM | POA: Diagnosis not present

## 2016-10-14 DIAGNOSIS — F17210 Nicotine dependence, cigarettes, uncomplicated: Secondary | ICD-10-CM | POA: Insufficient documentation

## 2016-10-14 DIAGNOSIS — N189 Chronic kidney disease, unspecified: Secondary | ICD-10-CM | POA: Diagnosis not present

## 2016-10-14 DIAGNOSIS — D649 Anemia, unspecified: Secondary | ICD-10-CM | POA: Insufficient documentation

## 2016-10-14 DIAGNOSIS — N3001 Acute cystitis with hematuria: Secondary | ICD-10-CM | POA: Diagnosis not present

## 2016-10-14 DIAGNOSIS — Z791 Long term (current) use of non-steroidal anti-inflammatories (NSAID): Secondary | ICD-10-CM | POA: Insufficient documentation

## 2016-10-14 DIAGNOSIS — R300 Dysuria: Secondary | ICD-10-CM | POA: Diagnosis present

## 2016-10-14 DIAGNOSIS — Z91040 Latex allergy status: Secondary | ICD-10-CM | POA: Insufficient documentation

## 2016-10-14 DIAGNOSIS — Z79899 Other long term (current) drug therapy: Secondary | ICD-10-CM | POA: Diagnosis not present

## 2016-10-14 DIAGNOSIS — J45909 Unspecified asthma, uncomplicated: Secondary | ICD-10-CM | POA: Insufficient documentation

## 2016-10-14 LAB — URINALYSIS, ROUTINE W REFLEX MICROSCOPIC
BACTERIA UA: NONE SEEN
Bilirubin Urine: NEGATIVE
GLUCOSE, UA: NEGATIVE mg/dL
Ketones, ur: NEGATIVE mg/dL
Nitrite: NEGATIVE
PROTEIN: 100 mg/dL — AB
SPECIFIC GRAVITY, URINE: 1.031 — AB (ref 1.005–1.030)
pH: 6 (ref 5.0–8.0)

## 2016-10-14 LAB — POC URINE PREG, ED: Preg Test, Ur: NEGATIVE

## 2016-10-14 MED ORDER — PHENAZOPYRIDINE HCL 200 MG PO TABS: 200.0000 mg | ORAL_TABLET | Freq: Three times a day (TID) | ORAL | 0 refills | Status: DC

## 2016-10-14 MED ORDER — PHENAZOPYRIDINE HCL 100 MG PO TABS
95.0000 mg | ORAL_TABLET | Freq: Once | ORAL | Status: AC
  Administered 2016-10-14: 100 mg via ORAL
  Filled 2016-10-14: qty 1

## 2016-10-14 MED ORDER — LIDOCAINE-EPINEPHRINE (PF) 2 %-1:200000 IJ SOLN
10.0000 mL | Freq: Once | INTRAMUSCULAR | Status: AC
  Administered 2016-10-14: 10 mL
  Filled 2016-10-14: qty 20

## 2016-10-14 MED ORDER — MAGNESIUM CITRATE PO SOLN: 296.0000 mL | ORAL | 2 refills | Status: DC | PRN

## 2016-10-14 NOTE — Discharge Instructions (Signed)
Continue taking your prescription of Ciprofloxacin as prescribed until completed. Take your prescription of Pyridium as needed for pain with urination. Continue drinking fluids at home. You may apply a small amount of Neosporin to your hemorrhoid once daily as needed for relief. Continue taking Miralax twice daily and additionally take Magnesium citrate once daily for your constipation.  Follow up with your primary care provider at your scheduled appointment on Tuesday for follow up regarding your I&D hemorrhoids and UTI (urine culture obtained in the ED today).  Please return to the Emergency Department if symptoms worsen or new onset of fever, abdominal pain, flank pain, vomiting, blood in urine, difficulty urinating, rectal bleeding, worsening constipation.

## 2016-10-14 NOTE — ED Notes (Signed)
Wet to dry dressing applied.

## 2016-10-14 NOTE — ED Provider Notes (Signed)
[facility] DEPT Provider Note   CSN: [id number] Arrival date & time: 10/14/16  1726     History   Chief Complaint Chief Complaint  Patient presents with  . Abdominal Pain    HPI [name] is a 31 y.o. female.  HPI   Patient is a 31 year old female with history of DDD s/p lumbar laminectomy, GERD, CKD, HLD, anemia, cervical cancer and PID who presents to the ED with multiple complaints. Patient states she began having dysuria a few days after having spinal surgery. She reports having L5 laminectomy performed on 10/05/16 without any complications. She states she followed up with her PCP this week and was started on Cipro which she has been taking for the past 4 days. She states her symptoms have continued and has not had any relief. Denies fever, chills, abdominal pain, nausea, vomiting, diarrhea, hematuria, urinary frequency, flank pain, vaginal bleeding or discharge.  Patient also notes she began to develop a hemorrhoid a few days after her recent surgery. She notes she was using over-the-counter Preparation H without relief resulting in her going to her PCP. Patient states she has been using her steroid cream as prescribed without improvement. She reports having rectal pain with associated constipation. Patient states she has been taking MiraLAX twice a day but has continued to have mild constipation with reported firm stool. Last BM earlier this afternoon. Denies rectal bleeding.   Past Medical History:  Diagnosis Date  . Abortion in first trimester 08/2016  . Anemia   . Anxiety    Panic attack  . Arthritis   . Asthma   . Bipolar 1 disorder ([place])   . Cancer ([place])   . Cervical cancer ([place])   . Chronic kidney disease    history of kidney shut down  no current problems  . Constipation   . DDD (degenerative disc disease), cervical   . Edema   . Gallstones   . GERD (gastroesophageal reflux disease)   . High cholesterol   . History of anemia   . History of renal  failure   . HPV (human papilloma virus) anogenital infection   . Leukocytosis    going to hematologist  . Migraine   . Obesity   . Obesity   . Pelvic inflammatory disease (PID) 05-08-11   previous hx. .Hx. childbirth x3-NVD  . PID (pelvic inflammatory disease)   . PTSD (post-traumatic stress disorder)   . Sciatica   . Scoliosis     Patient Active Problem List   Diagnosis Date Noted  . Herniated nucleus pulposus 09/28/2016  . Herniated nucleus pulposus, L4-5 left 11/30/2014  . Tobacco abuse 12/31/2012  . Leukocytosis, unspecified 12/31/2012  . Anemia, unspecified 12/31/2012  . Numbness of arm 11/14/2012  . Chronic pain syndrome 10/28/2012  . Neck pain 10/28/2012  . Back pain 10/28/2012  . Osteoarthritis 10/28/2012  . Obesity   . Migraine   . Asthma     Past Surgical History:  Procedure Laterality Date  . ABDOMINAL EXPLORATION SURGERY  approx 31 years old   rlq(due to adhesions around ovaries)-appendix and ovaries remain  . ANTERIOR CERVICAL DECOMP/DISCECTOMY FUSION N/A 12/09/2012   Procedure: ANTERIOR CERVICAL DECOMPRESSION/DISCECTOMY FUSION STRUCTURAL ALLOGRAFT TRESTLE PLATE CERVICAL FIVE-SIX,SIX-SEVEN;  Surgeon: Otilio Connors, MD;  Location: [facility];  Service: Neurosurgery;  Laterality: N/A;  . CHOLECYSTECTOMY  05/10/2011   Procedure: LAPAROSCOPIC CHOLECYSTECTOMY WITH INTRAOPERATIVE CHOLANGIOGRAM;  Surgeon: Gayland Curry, MD,FACS;  Location: [facility];  Service: General;  Laterality: N/A;  .  LUMBAR LAMINECTOMY/DECOMPRESSION MICRODISCECTOMY Left 11/30/2014   Procedure: Left lumbar four-five Microdiskectomy;  Surgeon: Kristeen Miss, MD;  Location: [facility];  Service: Neurosurgery;  Laterality: Left;  . LUMBAR LAMINECTOMY/DECOMPRESSION MICRODISCECTOMY Left 08/15/2015   Procedure: Left Lumbar four-five Microdiskectomy;  Surgeon: Kristeen Miss, MD;  Location: [facility];  Service: Neurosurgery;  Laterality: Left;  . LUMBAR LAMINECTOMY/DECOMPRESSION MICRODISCECTOMY Left  09/28/2016   Procedure: LEFT LUMBAR FIVE -SACRAL ONE Microdiscectomy;  Surgeon: Kristeen Miss, MD;  Location: [facility];  Service: Neurosurgery;  Laterality: Left;    OB History    Gravida Para Term Preterm AB Living   5 3 2 1 1 3    SAB TAB Ectopic Multiple Live Births   1       3       Home Medications    Prior to Admission medications   Medication Sig Start Date End Date Taking? Authorizing Provider  ciprofloxacin (CIPRO) 500 MG tablet Take 500 mg by mouth 2 (two) times daily. FOR 5 DAYS   Yes [provider]  esomeprazole (NEXIUM) 40 MG capsule Take 40 mg by mouth 2 (two) times daily.   Yes [provider]  lurasidone (LATUDA) 20 MG TABS tablet Take 20 mg by mouth daily.   Yes [provider]  meloxicam (MOBIC) 7.5 MG tablet Take 1 tablet (7.5 mg total) by mouth daily. 09/29/16  Yes Kristeen Miss, MD  ranitidine (ZANTAC) 150 MG tablet Take 150 mg by mouth 2 (two) times daily.   Yes [provider]  albuterol (PROAIR HFA) 108 (90 Base) MCG/ACT inhaler Inhale 2 puffs into the lungs every 6 (six) hours as needed for wheezing or shortness of breath.    [provider]  diazepam (VALIUM) 5 MG tablet Take 1 tablet (5 mg total) by mouth every 6 (six) hours as needed for muscle spasms. Patient not taking: Reported on 10/14/2016 09/29/16   Kristeen Miss, MD  ibuprofen (ADVIL,MOTRIN) 800 MG tablet Take 800 mg by mouth every 8 (eight) hours as needed for mild pain or moderate pain.    [provider]  magnesium citrate solution Take 296 mLs by mouth as needed (for constipation). OTC 10/14/16   Nona Dell, PA-C  oxyCODONE-acetaminophen (ROXICET) 5-325 MG tablet Take 1-2 tablets by mouth every 4 (four) hours as needed for severe pain. Patient not taking: Reported on 10/14/2016 09/29/16   Kristeen Miss, MD  phenazopyridine (PYRIDIUM) 200 MG tablet Take 1 tablet (200 mg total) by mouth 3 (three) times daily. 10/14/16   Nona Dell,  PA-C    Family History Family History  Problem Relation Age of Onset  . Diabetes Mother   . Endometriosis Mother   . Cancer Mother        lung  . Diabetes Father   . Heart disease Father   . Prostate cancer Maternal Grandfather   . Cancer Maternal Grandfather        prostate  . Colon polyps Maternal Grandmother   . Cirrhosis Paternal Grandfather     Social History Social History  Substance Use Topics  . Smoking status: Current Every Day Smoker    Packs/day: 1.00    Years: 12.00    Types: Cigarettes  . Smokeless tobacco: Never Used     Comment: trying to quit  . Alcohol use Yes     Comment: occasionally     Allergies   Banana; Latex; Orange fruit [citrus]; and Miconazole   Review of Systems Review of Systems  Gastrointestinal:  Positive for rectal pain.  Genitourinary: Positive for dysuria.  All other systems reviewed and are negative.    Physical Exam Updated Vital Signs BP 126/88 (BP Location: Right Arm)   Pulse 74   Temp 97.8 F (36.6 C) (Oral)   Resp 16   LMP 05/04/2016 (LMP Unknown)   SpO2 99%   Physical Exam  Constitutional: She is oriented to person, place, and time. She appears well-developed and well-nourished. No distress.  HENT:  Head: Normocephalic and atraumatic.  Mouth/Throat: Oropharynx is clear and moist. No oropharyngeal exudate.  Eyes: Conjunctivae and EOM are normal. Right eye exhibits no discharge. Left eye exhibits no discharge. No scleral icterus.  Neck: Normal range of motion. Neck supple.  Cardiovascular: Normal rate, regular rhythm, normal heart sounds and intact distal pulses.   Pulmonary/Chest: Effort normal and breath sounds normal. No respiratory distress. She has no wheezes. She has no rales. She exhibits no tenderness.  Abdominal: Soft. Normal appearance and bowel sounds are normal. She exhibits no distension and no mass. There is no tenderness. There is no rigidity, no rebound, no guarding and no CVA tenderness. No hernia.    Genitourinary: Rectal exam shows external hemorrhoid. Rectal exam shows no internal hemorrhoid, no fissure, no mass, no tenderness and anal tone normal.  Genitourinary Comments: Small thrombosed external hemorrhoid present, TTP, no active bleeding  Musculoskeletal: Normal range of motion. She exhibits no edema.  Neurological: She is alert and oriented to person, place, and time.  Skin: Skin is warm and dry. She is not diaphoretic.  Well-healing surgical incision present to lumbar midline; no swelling erythema, induration, fluctuance, drainage or tenderness present.   Nursing note and vitals reviewed.    ED Treatments / Results  Labs (all labs ordered are listed, but only abnormal results are displayed) Labs Reviewed  URINALYSIS, ROUTINE W REFLEX MICROSCOPIC - Abnormal; Notable for the following:       Result Value   APPearance CLOUDY (*)    Specific Gravity, Urine 1.031 (*)    Hgb urine dipstick SMALL (*)    Protein, ur 100 (*)    Leukocytes, UA SMALL (*)    Squamous Epithelial / LPF TOO NUMEROUS TO COUNT (*)    All other components within normal limits  URINE CULTURE  POC URINE PREG, ED    EKG  EKG Interpretation None       Radiology No results found.  Procedures .Marland KitchenIncision and Drainage Date/Time: 10/14/2016 7:17 PM Performed by: Nona Dell Authorized by: Nona Dell   Consent:    Consent obtained:  Verbal   Consent given by:  Patient Location:    Type:  External thrombosed hemorrhoid   Size:  1cm   Location:  Anogenital   Anogenital location:  Rectum Pre-procedure details:    Skin preparation:  Betadine Anesthesia (see MAR for exact dosages):    Anesthesia method:  Local infiltration   Local anesthetic:  Lidocaine 2% WITH epi Procedure type:    Complexity:  Simple Procedure details:    Incision types:  Single straight   Incision depth:  Dermal   Scalpel blade:  11   Wound treatment:  Wound left open   Packing materials:   None Post-procedure details:    Patient tolerance of procedure:  Tolerated well, no immediate complications   (including critical care time)  Medications Ordered in ED Medications  lidocaine-EPINEPHrine (XYLOCAINE W/EPI) 2 %-1:200000 (PF) injection 10 mL (10 mLs Infiltration Given 10/14/16 1934)  phenazopyridine (PYRIDIUM) tablet 100 mg (  100 mg Oral Given 10/14/16 1934)     Initial Impression / Assessment and Plan / ED Course  I have reviewed the triage vital signs and the nursing notes.  Pertinent labs & imaging results that were available during my care of the patient were reviewed by me and considered in my medical decision making (see chart for details).    Patient presents with dysuria that has been present for the past 4-5 days, reports symptoms started after having lumbar laminectomy performed over a week ago. Denies fever, abdominal pain, vomiting, flank pain, hematuria. Pt seen by PCP earlier this week, rx Cipro which she has been taking as rx for the past 4 days. VSS. Exam unremarkable, Abdomen soft and nontender, no CVA tenderness. UA showed small leuks, RBCs, 0-5 WBCs, no bacteria and TNTC epithelial cells; plan to have pt finish current rx of cipro and urine culture sent. Pt will be d/c home with symptomatic tx and advised to f/u with PCP at scheduled appointment in 2 days.   Patient also presents with gradually worsening external hemorrhoid that has been present for the past week. No relief with over-the-counter ointment or prescribed steroid cream. She reports previously being on Percocet for her back pain. She states she has been constipated over the past week. Denies rectal bleeding. Exam revealed 1 cm thrombosed external hemorrhoid, tender palpation, no active bleeding. Remaining exam unremarkable. I&D performed without any complications. Patient discharged home with symptomatic treatment. Advised to have wound rechecked right PCP at her scheduled appointment in 2 days. Patient  given stool softener and advised to continue taking MiraLAX. Discussed return precautions.  Final Clinical Impressions(s) / ED Diagnoses   Final diagnoses:  Thrombosed external hemorrhoid  Acute cystitis with hematuria    New Prescriptions Discharge Medication List as of 10/14/2016  7:27 PM    START taking these medications   Details  magnesium citrate solution Take 296 mLs by mouth as needed (for constipation). OTC, Starting Sun 10/14/2016, Print    phenazopyridine (PYRIDIUM) 200 MG tablet Take 1 tablet (200 mg total) by mouth 3 (three) times daily., Starting Sun 10/14/2016, Print         Nona Dell, PA-C 10/14/16 2001    Julianne Rice, MD 10/18/16 2112

## 2016-10-14 NOTE — ED Triage Notes (Signed)
Pt here for burning with urination; pt just finished taking cipro; pt sts hemorrhoid pain and hx of recent back sx

## 2016-10-15 LAB — URINE CULTURE

## 2016-11-19 ENCOUNTER — Encounter (HOSPITAL_COMMUNITY): Payer: Self-pay | Admitting: *Deleted

## 2016-11-19 ENCOUNTER — Emergency Department (HOSPITAL_COMMUNITY)
Admission: EM | Admit: 2016-11-19 | Discharge: 2016-11-19 | Disposition: A | Discharge status: Home / Self Care | Payer: Medicaid Other | Attending: Emergency Medicine | Admitting: Emergency Medicine

## 2016-11-19 DIAGNOSIS — Z8541 Personal history of malignant neoplasm of cervix uteri: Secondary | ICD-10-CM | POA: Diagnosis not present

## 2016-11-19 DIAGNOSIS — Z91040 Latex allergy status: Secondary | ICD-10-CM | POA: Insufficient documentation

## 2016-11-19 DIAGNOSIS — F17210 Nicotine dependence, cigarettes, uncomplicated: Secondary | ICD-10-CM | POA: Diagnosis not present

## 2016-11-19 DIAGNOSIS — N189 Chronic kidney disease, unspecified: Secondary | ICD-10-CM | POA: Diagnosis not present

## 2016-11-19 DIAGNOSIS — R300 Dysuria: Secondary | ICD-10-CM | POA: Diagnosis present

## 2016-11-19 DIAGNOSIS — Z79899 Other long term (current) drug therapy: Secondary | ICD-10-CM | POA: Diagnosis not present

## 2016-11-19 DIAGNOSIS — B9689 Other specified bacterial agents as the cause of diseases classified elsewhere: Secondary | ICD-10-CM | POA: Diagnosis not present

## 2016-11-19 DIAGNOSIS — J45909 Unspecified asthma, uncomplicated: Secondary | ICD-10-CM | POA: Insufficient documentation

## 2016-11-19 DIAGNOSIS — N3001 Acute cystitis with hematuria: Secondary | ICD-10-CM

## 2016-11-19 DIAGNOSIS — N760 Acute vaginitis: Secondary | ICD-10-CM | POA: Diagnosis not present

## 2016-11-19 LAB — URINALYSIS, ROUTINE W REFLEX MICROSCOPIC
Bacteria, UA: NONE SEEN
Bilirubin Urine: NEGATIVE
Glucose, UA: NEGATIVE mg/dL
Ketones, ur: NEGATIVE mg/dL
Nitrite: NEGATIVE
Protein, ur: 100 mg/dL — AB
SPECIFIC GRAVITY, URINE: 1.03 (ref 1.005–1.030)
pH: 6 (ref 5.0–8.0)

## 2016-11-19 LAB — COMPREHENSIVE METABOLIC PANEL
ALK PHOS: 54 U/L (ref 38–126)
ALT: 16 U/L (ref 14–54)
AST: 17 U/L (ref 15–41)
Albumin: 3.5 g/dL (ref 3.5–5.0)
Anion gap: 5 (ref 5–15)
BUN: 19 mg/dL (ref 6–20)
CALCIUM: 8.6 mg/dL — AB (ref 8.9–10.3)
CHLORIDE: 108 mmol/L (ref 101–111)
CO2: 24 mmol/L (ref 22–32)
CREATININE: 1.03 mg/dL — AB (ref 0.44–1.00)
GFR calc non Af Amer: >60 mL/min (ref >60)
Glucose, Bld: 84 mg/dL (ref 65–99)
Potassium: 3.5 mmol/L (ref 3.5–5.1)
SODIUM: 137 mmol/L (ref 135–145)
Total Bilirubin: 0.3 mg/dL (ref 0.3–1.2)
Total Protein: 6.4 g/dL — ABNORMAL LOW (ref 6.5–8.1)

## 2016-11-19 LAB — GC/CHLAMYDIA PROBE AMP (~~LOC~~) NOT AT ARMC
Chlamydia: NEGATIVE
Neisseria Gonorrhea: NEGATIVE

## 2016-11-19 LAB — I-STAT BETA HCG BLOOD, ED (MC, WL, AP ONLY)

## 2016-11-19 LAB — WET PREP, GENITAL
Sperm: NONE SEEN
TRICH WET PREP: NONE SEEN
YEAST WET PREP: NONE SEEN

## 2016-11-19 LAB — CBC
HCT: 32 % — ABNORMAL LOW (ref 36.0–46.0)
Hemoglobin: 10.5 g/dL — ABNORMAL LOW (ref 12.0–15.0)
MCH: 28.2 pg (ref 26.0–34.0)
MCHC: 32.8 g/dL (ref 30.0–36.0)
MCV: 85.8 fL (ref 78.0–100.0)
PLATELETS: 263 10*3/uL (ref 150–400)
RBC: 3.73 MIL/uL — AB (ref 3.87–5.11)
RDW: 16.2 % — AB (ref 11.5–15.5)
WBC: 11.7 10*3/uL — ABNORMAL HIGH (ref 4.0–10.5)

## 2016-11-19 MED ORDER — PHENAZOPYRIDINE HCL 200 MG PO TABS: 200.0000 mg | ORAL_TABLET | Freq: Three times a day (TID) | ORAL | 0 refills | Status: DC

## 2016-11-19 MED ORDER — ACETAMINOPHEN 500 MG PO TABS: 1000.0000 mg | ORAL_TABLET | Freq: Once | ORAL | Status: DC

## 2016-11-19 MED ORDER — METRONIDAZOLE 500 MG PO TABS: 500.0000 mg | ORAL_TABLET | Freq: Two times a day (BID) | ORAL | 0 refills | Status: DC

## 2016-11-19 MED ORDER — LIDOCAINE HCL (PF) 1 % IJ SOLN
INTRAMUSCULAR | Status: AC
  Administered 2016-11-19: 5 mL
  Filled 2016-11-19: qty 5

## 2016-11-19 MED ORDER — AZITHROMYCIN 250 MG PO TABS
1000.0000 mg | ORAL_TABLET | Freq: Once | ORAL | Status: AC
  Administered 2016-11-19: 1000 mg via ORAL
  Filled 2016-11-19: qty 4

## 2016-11-19 MED ORDER — PROCHLORPERAZINE EDISYLATE 5 MG/ML IJ SOLN: 10.0000 mg | Freq: Once | INTRAMUSCULAR | Status: DC

## 2016-11-19 MED ORDER — FLUCONAZOLE 150 MG PO TABS: 150.0000 mg | ORAL_TABLET | Freq: Every day | ORAL | 0 refills | Status: AC

## 2016-11-19 MED ORDER — CEFTRIAXONE SODIUM 250 MG IJ SOLR
250.0000 mg | Freq: Once | INTRAMUSCULAR | Status: AC
  Administered 2016-11-19: 250 mg via INTRAMUSCULAR
  Filled 2016-11-19: qty 250

## 2016-11-19 MED ORDER — CEPHALEXIN 500 MG PO CAPS: 500.0000 mg | ORAL_CAPSULE | Freq: Four times a day (QID) | ORAL | 0 refills | Status: DC

## 2016-11-19 MED ORDER — DEXAMETHASONE SODIUM PHOSPHATE 10 MG/ML IJ SOLN: 10.0000 mg | Freq: Once | INTRAMUSCULAR | Status: DC

## 2016-11-19 NOTE — Discharge Instructions (Signed)
You have a urinary tract infection. Please take antibiotics as prescribed. Pyridium for abdominal and discomfort with urination. You also have bacterial vaginosis, take flagyl as prescribed. Avoid alcohol while on flagyl.   Your gonorrhea and chlamydia testing are still pending. You received treatment for these infections in the emergency department. You will be contacted if the results are positive.Follow up with your appointment for pap smear tomorrow as scheduled  Return to the ED for worsening abdominal discomfort, fever, worsening back pain.

## 2016-11-19 NOTE — ED Provider Notes (Signed)
[facility] DEPT Provider Note   CSN: [id number] Arrival date & time: 11/19/16  0018     History   Chief Complaint Chief Complaint  Patient presents with  . Abdominal Pain    HPI [name] is a 31 y.o. female  with history of PID, chronic pain syndrome, recent lumbar surgery 09/28/16 presents to the ED for evaluation of pressure and "throbbing" pain in vagina and midline lower abdomen with urination x one week. She thinks she has a bladder infection. She states she has no money to get Azo and came to the ED. She reports recent abortion on June 2018 with "medication and cervical dilation". She thinks she was [redacted] weeks along. Last normal period was February 2018. States she started having vaginal bleeding today, heavy associated with abdominal cramping. She thinks this may be her period but wants to make sure. She is sexually active with men only without use of condoms. States she is allergic to latex. Denies new sexual partners in the last 7 years.  No fever, chills, sweats, myalgias, vaginal discharge or itching or sores. She has a scheduled Pap smear tomorrow with PCP  HPI  Past Medical History:  Diagnosis Date  . Abortion in first trimester 08/2016  . Anemia   . Anxiety    Panic attack  . Arthritis   . Asthma   . Bipolar 1 disorder ([place])   . Cancer ([place])   . Cervical cancer ([place])   . Chronic kidney disease    history of kidney shut down  no current problems  . Constipation   . DDD (degenerative disc disease), cervical   . Edema   . Gallstones   . GERD (gastroesophageal reflux disease)   . High cholesterol   . History of anemia   . History of renal failure   . HPV (human papilloma virus) anogenital infection   . Leukocytosis    going to hematologist  . Migraine   . Obesity   . Obesity   . Pelvic inflammatory disease (PID) 05-08-11   previous hx. .Hx. childbirth x3-NVD  . PID (pelvic inflammatory disease)   . PTSD (post-traumatic stress disorder)   .  Sciatica   . Scoliosis     Patient Active Problem List   Diagnosis Date Noted  . Herniated nucleus pulposus 09/28/2016  . Herniated nucleus pulposus, L4-5 left 11/30/2014  . Tobacco abuse 12/31/2012  . Leukocytosis, unspecified 12/31/2012  . Anemia, unspecified 12/31/2012  . Numbness of arm 11/14/2012  . Chronic pain syndrome 10/28/2012  . Neck pain 10/28/2012  . Back pain 10/28/2012  . Osteoarthritis 10/28/2012  . Obesity   . Migraine   . Asthma     Past Surgical History:  Procedure Laterality Date  . ABDOMINAL EXPLORATION SURGERY  approx 31 years old   rlq(due to adhesions around ovaries)-appendix and ovaries remain  . ANTERIOR CERVICAL DECOMP/DISCECTOMY FUSION N/A 12/09/2012   Procedure: ANTERIOR CERVICAL DECOMPRESSION/DISCECTOMY FUSION STRUCTURAL ALLOGRAFT TRESTLE PLATE CERVICAL FIVE-SIX,SIX-SEVEN;  Surgeon: Otilio Connors, MD;  Location: [facility];  Service: Neurosurgery;  Laterality: N/A;  . CHOLECYSTECTOMY  05/10/2011   Procedure: LAPAROSCOPIC CHOLECYSTECTOMY WITH INTRAOPERATIVE CHOLANGIOGRAM;  Surgeon: Gayland Curry, MD,FACS;  Location: [facility];  Service: General;  Laterality: N/A;  . LUMBAR LAMINECTOMY/DECOMPRESSION MICRODISCECTOMY Left 11/30/2014   Procedure: Left lumbar four-five Microdiskectomy;  Surgeon: Kristeen Miss, MD;  Location: [facility];  Service: Neurosurgery;  Laterality: Left;  . LUMBAR LAMINECTOMY/DECOMPRESSION MICRODISCECTOMY Left 08/15/2015   Procedure: Left Lumbar four-five Microdiskectomy;  Surgeon: Kristeen Miss, MD;  Location: [facility];  Service: Neurosurgery;  Laterality: Left;  . LUMBAR LAMINECTOMY/DECOMPRESSION MICRODISCECTOMY Left 09/28/2016   Procedure: LEFT LUMBAR FIVE -SACRAL ONE Microdiscectomy;  Surgeon: Kristeen Miss, MD;  Location: [facility];  Service: Neurosurgery;  Laterality: Left;    OB History    Gravida Para Term Preterm AB Living   5 3 2 1 1 3    SAB TAB Ectopic Multiple Live Births   1       3       Home Medications    Prior  to Admission medications   Medication Sig Start Date End Date Taking? Authorizing Provider  DULoxetine (CYMBALTA) 20 MG capsule Take 20 mg by mouth daily.   Yes [provider]  esomeprazole (NEXIUM) 40 MG capsule Take 40 mg by mouth 2 (two) times daily.   Yes [provider]  ibuprofen (ADVIL,MOTRIN) 800 MG tablet Take 800 mg by mouth every 8 (eight) hours as needed for mild pain or moderate pain.   Yes [provider]  traMADol (ULTRAM) 50 MG tablet Take 50 mg by mouth every 6 (six) hours as needed for moderate pain.   Yes [provider]  zolpidem (AMBIEN) 10 MG tablet Take 10 mg by mouth at bedtime as needed for sleep.   Yes [provider]  cephALEXin (KEFLEX) 500 MG capsule Take 1 capsule (500 mg total) by mouth 4 (four) times daily. 11/19/16   Kinnie Feil, PA-C  diazepam (VALIUM) 5 MG tablet Take 1 tablet (5 mg total) by mouth every 6 (six) hours as needed for muscle spasms. Patient not taking: Reported on 10/14/2016 09/29/16   Kristeen Miss, MD  magnesium citrate solution Take 296 mLs by mouth as needed (for constipation). OTC Patient not taking: Reported on 11/19/2016 10/14/16   Nona Dell, PA-C  meloxicam (MOBIC) 7.5 MG tablet Take 1 tablet (7.5 mg total) by mouth daily. Patient not taking: Reported on 11/19/2016 09/29/16   Kristeen Miss, MD  metroNIDAZOLE (FLAGYL) 500 MG tablet Take 1 tablet (500 mg total) by mouth 2 (two) times daily. 11/19/16   Kinnie Feil, PA-C  oxyCODONE-acetaminophen (ROXICET) 5-325 MG tablet Take 1-2 tablets by mouth every 4 (four) hours as needed for severe pain. Patient not taking: Reported on 10/14/2016 09/29/16   Kristeen Miss, MD  phenazopyridine (PYRIDIUM) 200 MG tablet Take 1 tablet (200 mg total) by mouth 3 (three) times daily. 11/19/16   Kinnie Feil, PA-C    Family History Family History  Problem Relation Age of Onset  . Diabetes Mother   . Endometriosis Mother   . Cancer Mother         lung  . Diabetes Father   . Heart disease Father   . Prostate cancer Maternal Grandfather   . Cancer Maternal Grandfather        prostate  . Colon polyps Maternal Grandmother   . Cirrhosis Paternal Grandfather     Social History Social History  Substance Use Topics  . Smoking status: Current Every Day Smoker    Packs/day: 1.00    Years: 12.00    Types: Cigarettes  . Smokeless tobacco: Never Used     Comment: trying to quit  . Alcohol use Yes     Comment: occasionally     Allergies   Banana; Latex; Orange fruit [citrus]; and Miconazole   Review of Systems Review of Systems  Constitutional: Negative for chills, diaphoresis and fever.  Respiratory: Negative for cough  and shortness of breath.   Cardiovascular: Negative for chest pain.  Gastrointestinal: Positive for abdominal pain. Negative for constipation, diarrhea, nausea and vomiting.  Genitourinary: Positive for difficulty urinating, dysuria, vaginal bleeding and vaginal pain. Negative for frequency and genital sores.  Musculoskeletal: Positive for back pain (midline along surgical incision).     Physical Exam Updated Vital Signs BP 110/69 (BP Location: Left Arm)   Pulse 60   Temp 97.6 F (36.4 C) (Oral)   Resp 12   Ht 5\' 6"  (1.676 m)   Wt 93.4 kg (206 lb)   LMP 11/19/2016   SpO2 100%   BMI 33.25 kg/m   Physical Exam  Constitutional: She is oriented to person, place, and time. She appears well-developed and well-nourished. No distress.  Nontoxic appearing  HENT:  Head: Normocephalic and atraumatic.  Nose: Nose normal.  Mouth/Throat: Oropharynx is clear and moist. No oropharyngeal exudate.  Eyes: Pupils are equal, round, and reactive to light. Conjunctivae and EOM are normal.  Neck: Normal range of motion. Neck supple. No JVD present.  Cardiovascular: Normal rate, regular rhythm, normal heart sounds and intact distal pulses.   No murmur heard. Pulmonary/Chest: Effort normal and breath sounds normal.  No respiratory distress. She has no wheezes. She has no rales. She exhibits no tenderness.  Abdominal: Soft. Bowel sounds are normal. She exhibits no distension and no mass. There is tenderness. There is no rebound and no guarding.  Suprapubic tenderness  No CVA tenderness Negative Murphy's and McBurney's  Genitourinary:  Genitourinary Comments: External genitalia normal without erythema, edema, tenderness, discharge or lesions.  No groin lymphadenopathy.  Vaginal mucosa and cervix normal, pink without lesions.  +blood in vaginal vault Uterus in midline, smooth, not enlarged or tender. No CMT. Non palpable adnexa.  Musculoskeletal: Normal range of motion. She exhibits no deformity.  Lymphadenopathy:    She has no cervical adenopathy.  Neurological: She is alert and oriented to person, place, and time. No sensory deficit.  Skin: Skin is warm and dry. Capillary refill takes less than 2 seconds.  Psychiatric: She has a normal mood and affect. Her behavior is normal. Judgment and thought content normal.  Nursing note and vitals reviewed.    ED Treatments / Results  Labs (all labs ordered are listed, but only abnormal results are displayed) Labs Reviewed  WET PREP, GENITAL - Abnormal; Notable for the following:       Result Value   Clue Cells Wet Prep HPF POC PRESENT (*)    WBC, Wet Prep HPF POC FEW (*)    All other components within normal limits  COMPREHENSIVE METABOLIC PANEL - Abnormal; Notable for the following:    Creatinine, Ser 1.03 (*)    Calcium 8.6 (*)    Total Protein 6.4 (*)    All other components within normal limits  CBC - Abnormal; Notable for the following:    WBC 11.7 (*)    RBC 3.73 (*)    Hemoglobin 10.5 (*)    HCT 32.0 (*)    RDW 16.2 (*)    All other components within normal limits  URINALYSIS, ROUTINE W REFLEX MICROSCOPIC - Abnormal; Notable for the following:    Hgb urine dipstick MODERATE (*)    Protein, ur 100 (*)    Leukocytes, UA MODERATE (*)     Squamous Epithelial / LPF 0-5 (*)    All other components within normal limits  I-STAT BETA HCG BLOOD, ED ([facility], [facility], [facility] ONLY)  GC/CHLAMYDIA PROBE AMP ([place])  NOT AT [facility]    EKG  EKG Interpretation None       Radiology No results found.  Procedures Procedures (including critical care time)  Medications Ordered in ED Medications  cefTRIAXone (ROCEPHIN) injection 250 mg (250 mg Intramuscular Given 11/19/16 0606)  azithromycin (ZITHROMAX) tablet 1,000 mg (1,000 mg Oral Given 11/19/16 0607)  lidocaine (PF) (XYLOCAINE) 1 % injection (5 mLs  Given 11/19/16 0606)     Initial Impression / Assessment and Plan / ED Course  I have reviewed the triage vital signs and the nursing notes.  Pertinent labs & imaging results that were available during my care of the patient were reviewed by me and considered in my medical decision making (see chart for details).  Clinical Course as of Nov 19 612  Mon Nov 19, 2016  0242 WBC: (!) 11.7 [CG]  0242 Hemoglobin: (!) 10.5 [CG]    Clinical Course User Index [CG] Kinnie Feil, PA-C   31 yo female with history of PID, chronic pain syndrome, recent lumbar surgery 09/28/16 presents to the ED for evaluation of dysuria and lower abdominal discomfort x one week. She thinks she has a bladder infection. High risk sexual behavior, does not use condoms. Reports medical abortion one month ago. Vaginal bleeding started yesterday. No fevers.  On exam VS WNL. Abdominal exam shows suprapubic tenderness, no rebound or rigidity. No CVAT. Pelvic exam reveals blood in the vaginal vault but no CMT or adnexal tenderness or fullness.   Lab work reassuring, mild leukocytosis at 11.7. Urinalysis shows UTI. Wet prep shows BV.   Final Clinical Impressions(s) / ED Diagnoses    Hx, exam and work up not consistent with pyelo, pancreatitis, appendicits or torsion. Will discharge with antibiotics for UTI and Pyridium. She was empirically treated for gonorrhea and  chlamydia. There is documented "cervical cancer" on patient's chart. I asked patient about this but she is unsure if she has HPV or cancer, she has an appointment with PCP tomorrow for pap smear, educated on difference between HPV and cancer advised her to clarify this with her PCP. She verbalized understanding and agreable with plan.   Final diagnoses:  Dysuria  Bacterial vaginosis  Acute cystitis with hematuria    New Prescriptions New Prescriptions   CEPHALEXIN (KEFLEX) 500 MG CAPSULE    Take 1 capsule (500 mg total) by mouth 4 (four) times daily.   METRONIDAZOLE (FLAGYL) 500 MG TABLET    Take 1 tablet (500 mg total) by mouth 2 (two) times daily.   PHENAZOPYRIDINE (PYRIDIUM) 200 MG TABLET    Take 1 tablet (200 mg total) by mouth 3 (three) times daily.     Kinnie Feil, PA-C 11/19/16 9147    Merryl Hacker, MD 11/20/16 [phone]

## 2016-11-19 NOTE — ED Triage Notes (Signed)
The pt is c/o abd pain and an irregular period for 3 months.  Her period this month came on then off and then it came back.last month period was only 1 day

## 2016-11-19 NOTE — ED Notes (Signed)
Pt requested hot chocolate. OK per RN Sharrie Rothman

## 2017-02-13 ENCOUNTER — Encounter (HOSPITAL_COMMUNITY): Payer: Self-pay | Admitting: Emergency Medicine

## 2017-02-13 ENCOUNTER — Emergency Department (HOSPITAL_COMMUNITY)
Admission: EM | Admit: 2017-02-13 | Discharge: 2017-02-14 | Disposition: A | Discharge status: Home / Self Care | Payer: Medicaid Other | Attending: Emergency Medicine | Admitting: Emergency Medicine

## 2017-02-13 ENCOUNTER — Emergency Department (HOSPITAL_COMMUNITY): Payer: Medicaid Other

## 2017-02-13 DIAGNOSIS — N189 Chronic kidney disease, unspecified: Secondary | ICD-10-CM | POA: Insufficient documentation

## 2017-02-13 DIAGNOSIS — R0789 Other chest pain: Secondary | ICD-10-CM | POA: Insufficient documentation

## 2017-02-13 DIAGNOSIS — M546 Pain in thoracic spine: Secondary | ICD-10-CM | POA: Insufficient documentation

## 2017-02-13 DIAGNOSIS — Z8541 Personal history of malignant neoplasm of cervix uteri: Secondary | ICD-10-CM | POA: Insufficient documentation

## 2017-02-13 DIAGNOSIS — F17210 Nicotine dependence, cigarettes, uncomplicated: Secondary | ICD-10-CM | POA: Insufficient documentation

## 2017-02-13 DIAGNOSIS — M542 Cervicalgia: Secondary | ICD-10-CM | POA: Diagnosis not present

## 2017-02-13 DIAGNOSIS — Z79899 Other long term (current) drug therapy: Secondary | ICD-10-CM | POA: Insufficient documentation

## 2017-02-13 DIAGNOSIS — M545 Low back pain: Secondary | ICD-10-CM | POA: Diagnosis not present

## 2017-02-13 DIAGNOSIS — Z91040 Latex allergy status: Secondary | ICD-10-CM | POA: Insufficient documentation

## 2017-02-13 DIAGNOSIS — J45909 Unspecified asthma, uncomplicated: Secondary | ICD-10-CM | POA: Diagnosis not present

## 2017-02-13 LAB — POC URINE PREG, ED: Preg Test, Ur: NEGATIVE

## 2017-02-13 MED ORDER — HYDROCODONE-ACETAMINOPHEN 5-325 MG PO TABS
2.0000 | ORAL_TABLET | Freq: Once | ORAL | Status: AC
  Administered 2017-02-13: 2 via ORAL
  Filled 2017-02-13: qty 2

## 2017-02-13 NOTE — ED Notes (Signed)
Patient transported to X-ray 
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
## 2017-02-14 MED ORDER — ESOMEPRAZOLE MAGNESIUM 40 MG PO CPDR: 40.0000 mg | DELAYED_RELEASE_CAPSULE | Freq: Two times a day (BID) | ORAL | 0 refills | Status: DC

## 2017-02-14 MED ORDER — HYDROCODONE-ACETAMINOPHEN 5-325 MG PO TABS
1.0000 | ORAL_TABLET | Freq: Four times a day (QID) | ORAL | 0 refills | Status: DC | PRN
Start: 2017-02-14 — End: 2017-06-26

## 2017-02-14 MED ORDER — FAMOTIDINE 20 MG PO TABS
20.0000 mg | ORAL_TABLET | Freq: Once | ORAL | Status: AC
  Administered 2017-02-14: 20 mg via ORAL
  Filled 2017-02-14: qty 1

## 2017-02-14 MED ORDER — CYCLOBENZAPRINE HCL 10 MG PO TABS: 10.0000 mg | ORAL_TABLET | Freq: Two times a day (BID) | ORAL | 0 refills | Status: DC | PRN

## 2017-02-14 MED ORDER — IBUPROFEN 800 MG PO TABS: 800.0000 mg | ORAL_TABLET | Freq: Three times a day (TID) | ORAL | 0 refills | Status: DC | PRN

## 2017-02-14 MED ORDER — RANITIDINE HCL 150 MG PO TABS: 150.0000 mg | ORAL_TABLET | Freq: Every day | ORAL | 0 refills | Status: DC
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
## 2017-03-26 NOTE — L&D Delivery Note (Signed)
OB/GYN Faculty Practice Delivery Note  Ariyonna Foye Haggart is a 32 y.o. [id number] s/p SVD at [redacted]w[redacted]d. She was admitted for spontaneous onset of labor.   ROM: 0h 54m with clear fluid GBS Status: negative Maximum Maternal Temperature: Temp (24hrs), Avg:97.8 F (36.6 C), Min:97.7 F (36.5 C), Max:97.8 F (36.6 C)  Labor Progress: . Presented to [facility] in active labor . Admitted to floor and obtained epidural  Delivery: Called to room and patient was complete and pushing. Head delivered LOA. Loose nuchal cord present, delivered through. Shoulder and body delivered in usual fashion. Infant with spontaneous cry, placed on mother's abdomen, dried and stimulated. Cord clamped x 2 after 1-minute delay, and cut by father of baby. Cord blood drawn. Placenta delivered spontaneously with gentle cord traction. Fundus firm with massage and Pitocin. Labia, perineum, vagina, and cervix inspected inspected with right labial laceration.   Placenta: spontaneous, intact, 3-vessel cord Complications: none Lacerations: right labial, repaired with 4-0 vicryl EBL: 400cc  Postpartum Planning [x]  message to sent to schedule follow-up  [x]  vaccines UTD [x]  NPO for planned BTL later this morning  Infant: Vigorous female, "Benjamine Mola", APGARs 9, [place]. Juleen China, DO OB/GYN Fellow, Faculty Practice

## 2017-04-27 ENCOUNTER — Encounter: Payer: Self-pay | Admitting: Obstetrics and Gynecology

## 2017-05-01 ENCOUNTER — Encounter: Payer: Medicaid Other | Admitting: Obstetrics and Gynecology

## 2017-05-02 ENCOUNTER — Encounter: Payer: Self-pay | Admitting: *Deleted

## 2017-05-03 ENCOUNTER — Ambulatory Visit (INDEPENDENT_AMBULATORY_CARE_PROVIDER_SITE_OTHER): Payer: Medicaid Other | Admitting: Obstetrics & Gynecology

## 2017-05-03 ENCOUNTER — Ambulatory Visit: Payer: Self-pay

## 2017-05-03 DIAGNOSIS — O3680X0 Pregnancy with inconclusive fetal viability, not applicable or unspecified: Secondary | ICD-10-CM | POA: Diagnosis not present

## 2017-05-03 DIAGNOSIS — Z3490 Encounter for supervision of normal pregnancy, unspecified, unspecified trimester: Secondary | ICD-10-CM

## 2017-05-03 LAB — POCT PREGNANCY, URINE: PREG TEST UR: POSITIVE — AB

## 2017-05-03 NOTE — Progress Notes (Signed)
Pt informed that the ultrasound is considered a limited OB ultrasound and is not intended to be a complete ultrasound exam.  Patient also informed that the ultrasound is not being completed with the intent of assessing for fetal or placental anomalies or any pelvic abnormalities.  Explained that the purpose of today's ultrasound is to assess for viability and EGA.  Patient acknowledges the purpose of the exam and the limitations of the study.    Single IUP FHR - 166 bpm per M-mode CRL- 4.72 cm (11w 3d) FM present Dr. Roselie Awkward informed of results

## 2017-05-29 ENCOUNTER — Encounter: Payer: Medicaid Other | Admitting: Obstetrics and Gynecology

## 2017-05-29 ENCOUNTER — Encounter: Payer: Self-pay | Admitting: Medical

## 2017-05-29 ENCOUNTER — Other Ambulatory Visit (HOSPITAL_COMMUNITY)
Admission: RE | Admit: 2017-05-29 | Discharge: 2017-05-29 | Disposition: A | Discharge status: Home / Self Care | Payer: Medicaid Other | Source: Ambulatory Visit | Attending: Medical | Admitting: Medical

## 2017-05-29 ENCOUNTER — Other Ambulatory Visit: Payer: Medicaid Other

## 2017-05-29 ENCOUNTER — Ambulatory Visit (INDEPENDENT_AMBULATORY_CARE_PROVIDER_SITE_OTHER): Payer: Medicaid Other | Admitting: Medical

## 2017-05-29 VITALS — BP 107/69 | HR 75 | Wt 224.7 lb

## 2017-05-29 DIAGNOSIS — O99212 Obesity complicating pregnancy, second trimester: Secondary | ICD-10-CM | POA: Diagnosis not present

## 2017-05-29 DIAGNOSIS — O0992 Supervision of high risk pregnancy, unspecified, second trimester: Secondary | ICD-10-CM | POA: Insufficient documentation

## 2017-05-29 DIAGNOSIS — O99322 Drug use complicating pregnancy, second trimester: Secondary | ICD-10-CM | POA: Diagnosis not present

## 2017-05-29 DIAGNOSIS — Z6841 Body Mass Index (BMI) 40.0 and over, adult: Principal | ICD-10-CM

## 2017-05-29 DIAGNOSIS — Z23 Encounter for immunization: Secondary | ICD-10-CM

## 2017-05-29 DIAGNOSIS — O09212 Supervision of pregnancy with history of pre-term labor, second trimester: Secondary | ICD-10-CM

## 2017-05-29 DIAGNOSIS — O99332 Smoking (tobacco) complicating pregnancy, second trimester: Secondary | ICD-10-CM | POA: Insufficient documentation

## 2017-05-29 DIAGNOSIS — G8929 Other chronic pain: Secondary | ICD-10-CM

## 2017-05-29 DIAGNOSIS — M545 Low back pain, unspecified: Secondary | ICD-10-CM

## 2017-05-29 DIAGNOSIS — F17210 Nicotine dependence, cigarettes, uncomplicated: Secondary | ICD-10-CM | POA: Diagnosis not present

## 2017-05-29 DIAGNOSIS — O09892 Supervision of other high risk pregnancies, second trimester: Secondary | ICD-10-CM | POA: Insufficient documentation

## 2017-05-29 DIAGNOSIS — Z3A15 15 weeks gestation of pregnancy: Secondary | ICD-10-CM | POA: Diagnosis not present

## 2017-05-29 DIAGNOSIS — Z3480 Encounter for supervision of other normal pregnancy, unspecified trimester: Secondary | ICD-10-CM

## 2017-05-29 LAB — POCT URINALYSIS DIP (DEVICE)
BILIRUBIN URINE: NEGATIVE
Glucose, UA: NEGATIVE mg/dL
Hgb urine dipstick: NEGATIVE
LEUKOCYTES UA: NEGATIVE
NITRITE: NEGATIVE
Protein, ur: NEGATIVE mg/dL
Specific Gravity, Urine: >=1.03 (ref 1.005–1.030)
Urobilinogen, UA: 0.2 mg/dL (ref 0.0–1.0)
pH: 6.5 (ref 5.0–8.0)

## 2017-05-29 MED ORDER — PRENATAL VITAMINS 0.8 MG PO TABS: 1.0000 | ORAL_TABLET | Freq: Every day | ORAL | 12 refills | Status: DC

## 2017-05-29 MED ORDER — PROMETHAZINE HCL 12.5 MG PO TABS: 12.5000 mg | ORAL_TABLET | Freq: Four times a day (QID) | ORAL | 0 refills | Status: DC | PRN

## 2017-05-29 NOTE — Progress Notes (Signed)
   PRENATAL VISIT NOTE  Subjective:  [name] is a 32 y.o. [id number] at [redacted]w[redacted]d being seen today for her first prenatal care visit.  She is currently monitored for the following issues for this high-risk pregnancy and has Obesity; Migraine; Asthma; Chronic pain syndrome; Neck pain; Back pain; Osteoarthritis; Herniated nucleus pulposus, L4-5 left; Herniated nucleus pulposus; Supervision of high risk pregnancy, antepartum, second trimester; History of preterm delivery, currently pregnant in second trimester; Tobacco use in pregnancy, antepartum, second trimester; and Drug use affecting pregnancy in second trimester on their problem list.  Patient reports backache, nausea, vomiting and depression and insomnia.  Contractions: Not present. Vag. Bleeding: None.  Movement: Present. Denies leaking of fluid.   The following portions of the patient's history were reviewed and updated as appropriate: allergies, current medications, past family history, past medical history, past social history, past surgical history and problem list. Problem list updated.  Objective:   Vitals:   05/29/17 1350  BP: 107/69  Pulse: 75  Weight: 224 lb 11.2 oz (101.9 kg)    Fetal Status: Fetal Heart Rate (bpm): 148   Movement: Present     General:  Alert, oriented and cooperative. Patient is in no acute distress.  Skin: Skin is warm and dry. No rash noted.   Cardiovascular: Normal heart rate noted  Respiratory: Normal respiratory effort, no problems with respiration noted  Abdomen: Soft, gravid, appropriate for gestational age.  Pain/Pressure: Present     Pelvic: Cervical exam performed Dilation: Closed Effacement (%): Thick    Extremities: Normal range of motion.  Edema: Trace  Mental Status:  Normal mood and affect. Normal behavior. Normal judgment and thought content.   Assessment and Plan:  Pregnancy: [id number] at [redacted]w[redacted]d  1. Supervision of high risk pregnancy, antepartum, second trimester - Cytology -  PAP - Genetic Screening - US MFM OB DETAIL +14 WK; scheduled - Ambulatory referral to [name] - will return this week to see [name] - Flu Vaccine QUAD 36+ mos IM - TSH - Patient states some history of possible thyroid problem, unsure what, will bring records from PCP  - Prenatal Multivit-Min-Fe-FA (PRENATAL VITAMINS) 0.8 MG tablet; Take 1 tablet by mouth daily.  Dispense: 30 tablet; Refill: 12  2. History of preterm delivery, currently pregnant in second trimester - Agrees to 17-p  - Medication ordered, will call for injection appointment when arrives at [facility]   3. Tobacco use in pregnancy, antepartum, second trimester - Smoking and tobacco cessation was discussed at today's visit for 3 minutes   4. Drug use affecting pregnancy in second trimester - Counseled on MJ cessation   5. Class 3 severe obesity without serious comorbidity with body mass index (BMI) of 40.0 to 44.9 in adult, unspecified obesity type ([name])  6. Chronic midline low back pain without sciatica - Tylenol PRN for pain advised   7. Nausea and vomiting in pregnancy prior to [redacted] weeks gestation  - promethazine (PHENERGAN) 12.5 MG tablet; Take 1 tablet (12.5 mg total) by mouth every 6 (six) hours as needed for nausea or vomiting.  Dispense: 30 tablet; Refill: 0  Second trimester warning symptoms and general obstetric precautions including but not limited to vaginal bleeding, contractions, leaking of fluid and fetal movement were reviewed in detail with the patient. Please refer to After Visit Summary for other counseling recommendations.  Return in about 4 weeks (around 06/26/2017) for [facility] with MD.   Kerry Hough, PA-C

## 2017-05-29 NOTE — Patient Instructions (Signed)
Prenatal Care WHAT IS PRENATAL CARE? Prenatal care is the process of caring for a pregnant woman before she gives birth. Prenatal care makes sure that she and her baby remain as healthy as possible throughout pregnancy. Prenatal care may be provided by a midwife, family practice health care provider, or a childbirth and pregnancy specialist (obstetrician). Prenatal care may include physical examinations, testing, treatments, and education on nutrition, lifestyle, and social support services. WHY IS PRENATAL CARE SO IMPORTANT? Early and consistent prenatal care increases the chance that you and your baby will remain healthy throughout your pregnancy. This type of care also decreases a baby's risk of being born too early (prematurely), or being born smaller than expected (small for gestational age). Any underlying medical conditions you may have that could pose a risk during your pregnancy are discussed during prenatal care visits. You will also be monitored regularly for any new conditions that may arise during your pregnancy so they can be treated quickly and effectively. WHAT HAPPENS DURING PRENATAL CARE VISITS? Prenatal care visits may include the following: Discussion Tell your health care provider about any new signs or symptoms you have experienced since your last visit. These might include:  Nausea or vomiting.  Increased or decreased level of energy.  Difficulty sleeping.  Back or leg pain.  Weight changes.  Frequent urination.  Shortness of breath with physical activity.  Changes in your skin, such as the development of a rash or itchiness.  Vaginal discharge or bleeding.  Feelings of excitement or nervousness.  Changes in your baby's movements.  You may want to write down any questions or topics you want to discuss with your health care provider and bring them with you to your appointment. Examination During your first prenatal care visit, you will likely have a complete  physical exam. Your health care provider will often examine your vagina, cervix, and the position of your uterus, as well as check your heart, lungs, and other body systems. As your pregnancy progresses, your health care provider will measure the size of your uterus and your baby's position inside your uterus. He or she may also examine you for early signs of labor. Your prenatal visits may also include checking your blood pressure and, after about 10-12 weeks of pregnancy, listening to your baby's heartbeat. Testing Regular testing often includes:  Urinalysis. This checks your urine for glucose, protein, or signs of infection.  Blood count. This checks the levels of white and red blood cells in your body.  Tests for sexually transmitted infections (STIs). Testing for STIs at the beginning of pregnancy is routinely done and is required in many states.  Antibody testing. You will be checked to see if you are immune to certain illnesses, such as rubella, that can affect a developing fetus.  Glucose screen. Around 24-28 weeks of pregnancy, your blood glucose level will be checked for signs of gestational diabetes. Follow-up tests may be recommended.  Group B strep. This is a bacteria that is commonly found inside a woman's vagina. This test will inform your health care provider if you need an antibiotic to reduce the amount of this bacteria in your body prior to labor and childbirth.  Ultrasound. Many pregnant women undergo an ultrasound screening around 18-20 weeks of pregnancy to evaluate the health of the fetus and check for any developmental abnormalities.  HIV (human immunodeficiency virus) testing. Early in your pregnancy, you will be screened for HIV. If you are at high risk for HIV, this test may   be repeated during your third trimester of pregnancy.  You may be offered other testing based on your age, personal or family medical history, or other factors. HOW OFTEN SHOULD I PLAN TO SEE MY  HEALTH CARE PROVIDER FOR PRENATAL CARE? Your prenatal care check-up schedule depends on any medical conditions you have before, or develop during, your pregnancy. If you do not have any underlying medical conditions, you will likely be seen for checkups:  Monthly, during the first 6 months of pregnancy.  Twice a month during months 7 and 8 of pregnancy.  Weekly starting in the 9th month of pregnancy and until delivery.  If you develop signs of early labor or other concerning signs or symptoms, you may need to see your health care provider more often. Ask your health care provider what prenatal care schedule is best for you. WHAT CAN I DO TO KEEP MYSELF AND MY BABY AS HEALTHY AS POSSIBLE DURING MY PREGNANCY?  Take a prenatal vitamin containing 400 micrograms (0.4 mg) of folic acid every day. Your health care provider may also ask you to take additional vitamins such as iodine, vitamin D, iron, copper, and zinc.  Take 1500-2000 mg of calcium daily starting at your 20th week of pregnancy until you deliver your baby.  Make sure you are up to date on your vaccinations. Unless directed otherwise by your health care provider: ? You should receive a tetanus, diphtheria, and pertussis (Tdap) vaccination between the 27th and 36th week of your pregnancy, regardless of when your last Tdap immunization occurred. This helps protect your baby from whooping cough (pertussis) after he or she is born. ? You should receive an annual inactivated influenza vaccine (IIV) to help protect you and your baby from influenza. This can be done at any point during your pregnancy.  Eat a well-rounded diet that includes: ? Fresh fruits and vegetables. ? Lean proteins. ? Calcium-rich foods such as milk, yogurt, hard cheeses, and dark, leafy greens. ? Whole grain breads.  Do noteat seafood high in mercury, including: ? Swordfish. ? Tilefish. ? Shark. ? King mackerel. ? More than 6 oz tuna per week.  Do not  eat: ? Raw or undercooked meats or eggs. ? Unpasteurized foods, such as soft cheeses (brie, blue, or feta), juices, and milks. ? Lunch meats. ? Hot dogs that have not been heated until they are steaming.  Drink enough water to keep your urine clear or pale yellow. For many women, this may be 10 or more 8 oz glasses of water each day. Keeping yourself hydrated helps deliver nutrients to your baby and may prevent the start of pre-term uterine contractions.  Do not use any tobacco products including cigarettes, chewing tobacco, or electronic cigarettes. If you need help quitting, ask your health care provider.  Do not drink beverages containing alcohol. No safe level of alcohol consumption during pregnancy has been determined.  Do not use any illegal drugs. These can harm your developing baby or cause a miscarriage.  Ask your health care provider or pharmacist before taking any prescription or over-the-counter medicines, herbs, or supplements.  Limit your caffeine intake to no more than 200 mg per day.  Exercise. Unless told otherwise by your health care provider, try to get 30 minutes of moderate exercise most days of the week. Do not  do high-impact activities, contact sports, or activities with a high risk of falling, such as horseback riding or downhill skiing.  Get plenty of rest.  Avoid anything that raises your  body temperature, such as hot tubs and saunas.  If you own a cat, do not empty its litter box. Bacteria contained in cat feces can cause an infection called toxoplasmosis. This can result in serious harm to the fetus.  Stay away from chemicals such as insecticides, lead, mercury, and cleaning or paint products that contain solvents.  Do not have any X-rays taken unless medically necessary.  Take a childbirth and breastfeeding preparation class. Ask your health care provider if you need a referral or recommendation.  This information is not intended to replace advice given  to you by your health care provider. Make sure you discuss any questions you have with your health care provider. Document Released: 03/15/2003 Document Revised: 08/15/2015 Document Reviewed: 05/27/2013 Elsevier Interactive Patient Education  2017 [place] Education Options: [facility] Department Classes:  Childbirth education classes can help you get ready for a positive parenting experience. You can also meet other expectant parents and get free stuff for your baby. Each class runs for five weeks on the same night and costs $45 for the mother-to-be and her support person. Medicaid covers the cost if you are eligible. Call [phone] to register. [facility] Childbirth Education:  [phone] or [phone] or [email or web address]  Baby & Me Class: Discuss newborn & infant parenting and family adjustment issues with other new mothers in a relaxed environment. Each week brings a new speaker or baby-centered activity. We encourage new mothers to join [place] every Thursday at 11:00am. Babies birth until crawling. No registration or fee. Daddy [employer]: This course offers Dads-to-be the tools and knowledge needed to feel confident on their journey to becoming new fathers. Experienced dads, who have been trained as coaches, teach dads-to-be how to hold, comfort, diaper, swaddle and play with their infant while being able to support the new mom as well. A class for men taught by men. $25/dad Big Brother/Big Sister: Let your children share in the joy of a new brother or sister in this special class designed just for them. Class includes discussion about how families care for babies: swaddling, holding, diapering, safety as well as how they can be helpful in their new role. This class is designed for children ages 43 to 60, but any age is welcome. Please register each child individually. $5/child  Mom Talk: This mom-led group offers support and connection to mothers as  they journey through the adjustments and struggles of that sometimes overwhelming first year after the birth of a child. Tuesdays at 10:00am and Thursdays at 6:00pm. Babies welcome. No registration or fee. Breastfeeding Support Group: This group is a mother-to-mother support circle where moms have the opportunity to share their breastfeeding experiences. A Lactation Consultant is present for questions and concerns. Meets each Tuesday at 11:00am. No fee or registration. Breastfeeding Your Baby: Learn what to expect in the first days of breastfeeding your newborn.  This class will help you feel more confident with the skills needed to begin your breastfeeding experience. Many new mothers are concerned about breastfeeding after leaving the hospital. This class will also address the most common fears and challenges about breastfeeding during the first few weeks, months and beyond. (call for fee) Comfort Techniques and Tour: This 2 hour interactive class will provide you the opportunity to learn & practice hands-on techniques that can help relieve some of the discomfort of labor and encourage your baby to rotate toward the best position for birth. You and your partner will be able to try  a variety of labor positions with birth balls and rebozos as well as practice breathing, relaxation, and visualization techniques. A tour of the [facility] is included with this class. $20 per registrant and support person Childbirth Class- Weekend Option: This class is a Weekend version of our Birth & Baby series. It is designed for parents who have a difficult time fitting several weeks of classes into their schedule. It covers the care of your newborn and the basics of labor and childbirth. It also includes a [place] [facility] and lunch. The class is held two consecutive days: beginning on Friday evening from 6:30 - 8:30 p.m. and the next day, Saturday from 9 a.m. - 4 p.m.  (call for fee) Doren Custard Class: Interested in a waterbirth?  This informational class will help you discover whether waterbirth is the right fit for you. Education about waterbirth itself, supplies you would need and how to assemble your support team is what you can expect from this class. Some obstetrical practices require this class in order to pursue a waterbirth. (Not all obstetrical practices offer waterbirth-check with your healthcare provider.) Register only the expectant mom, but you are encouraged to bring your partner to class! Required if planning waterbirth, no fee. Infant/Child CPR: Parents, grandparents, babysitters, and friends learn Cardio-Pulmonary Resuscitation skills for infants and children. You will also learn how to treat both conscious and unconscious choking in infants and children. This Family & Friends program does not offer certification. Register each participant individually to ensure that enough mannequins are available. (Call for fee) Grandparent Love: Expecting a grandbaby? This class is for you! Learn about the latest infant care and safety recommendations and ways to support your own child as he or she transitions into the parenting role. Taught by Registered Nurses who are childbirth instructors, but most importantly...they are grandmothers too! $10/person. Childbirth Class- Natural Childbirth: This series of 5 weekly classes is for expectant parents who want to learn and practice natural methods of coping with the process of labor and childbirth. Relaxation, breathing, massage, visualization, role of the partner, and helpful positioning are highlighted. Participants learn how to be confident in their body's ability to give birth. This class will empower and help parents make informed decisions about their own care. Includes discussion that will help new parents transition into the immediate postpartum period. [facility] is included. We  suggest taking this class between 25-32 weeks, but it's only a recommendation. $75 per registrant and one support person or $30 Medicaid. Childbirth Class- 3 week Series: This option of 3 weekly classes helps you and your labor partner prepare for childbirth. Newborn care, labor & birth, cesarean birth, pain management, and comfort techniques are discussed and a [place] [facility] is included. The class meets at the same time, on the same day of the week for 3 consecutive weeks beginning with the starting date you choose. $60 for registrant and one support person.  Marvelous Multiples: Expecting twins, triplets, or more? This class covers the differences in labor, birth, parenting, and breastfeeding issues that face multiples' parents. NICU tour is included. Led by a Certified Childbirth Educator who is the mother of twins. No fee. Caring for Baby: This class is for expectant and adoptive parents who want to learn and practice the most up-to-date newborn care for their babies. Focus is on birth through the first six weeks of life. Topics include feeding, bathing, diapering,  crying, umbilical cord care, circumcision care and safe sleep. Parents learn to recognize symptoms of illness and when to call the pediatrician. Register only the mom-to-be and your partner or support person can plan to come with you! $10 per registrant and support person Childbirth Class- online option: This online class offers you the freedom to complete a Birth and Baby series in the comfort of your own home. The flexibility of this option allows you to review sections at your own pace, at times convenient to you and your support people. It includes additional video information, animations, quizzes, and extended activities. Get organized with helpful eClass tools, checklists, and trackers. Once you register online for the class, you will receive an email within a few days to accept the invitation and begin the  class when the time is right for you. The content will be available to you for 60 days. $60 for 60 days of online access for you and your support people.  Local Doulas: Natural Baby Doulas [email or web address] Tel: [phone] [email or web address] [employer] [phone] [email or web address] The Labor Hassell Halim  (also do waterbirth tub rental) [phone] [email or web address] Triad Birth Doula [phone] [email or web address] NotebookDistributors.fi Sacred Rhythms  [phone] https://[email or web address]-[email or web address]/ [employer] (PADA) [email or web address]://[email or web address]/ [place] Birth and Baby  [email or web address]/ Considering Waterbirth? Guide for patients at [facility] [employer]  Why consider waterbirth?  . Gentle birth for babies . Less pain medicine used in labor . May allow for passive descent/less pushing . May reduce perineal tears  . More mobility and instinctive maternal position changes . Increased maternal relaxation . Reduced blood pressure in labor  Is waterbirth safe? What are the risks of infection, drowning or other complications?  . Infection: o Very low risk (3.7 % for tub vs 4.8% for bed) o 7 in 8000 waterbirths with documented infection o Poorly cleaned equipment most common cause o Slightly lower group B strep transmission rate  . Drowning o Maternal:  - Very low risk   - Related to seizures or fainting o Newborn:  - Very low risk. No evidence of increased risk of respiratory problems in multiple large studies - Physiological protection from breathing under water - Avoid underwater birth if there are any fetal complications - Once baby's head is out of the water, keep it out.  . Birth complication o Some reports of cord trauma, but risk decreased by bringing baby to surface  gradually o No evidence of increased risk of shoulder dystocia. Mothers can usually change positions faster in water than in a bed, possibly aiding the maneuvers to free the shoulder.   You must attend a Doren Custard class at [facility]  3rd Wednesday of every month from 7-9pm  [employer] by calling [phone] or online at [email or web address]  Bring [place] the certificate from the class to your prenatal appointment  Meet with a midwife at 36 weeks to see if you can still plan a waterbirth and to sign the consent.   Purchase or rent the following supplies:   Water Birth Pool (Birth Pool in a Box or [place] for instance)  (Tubs start ~$125)  Single-use disposable tub liner designed for your brand of tub  New garden hose labeled "lead-free", "suitable for drinking water",  Electric drain pump to remove water (We recommend 792 gallon per hour or greater pump.)   Separate garden hose to remove the dirty water  Fish net  Bathing suit top (optional)  Long-handled mirror (optional)  Places to purchase or rent supplies  GotWebTools.is for tub purchases and supplies  Waterbirthsolutions.com for tub purchases and supplies  The Labor Ladies (www.[email or web address]) $275 for tub rental/set-up & take down/kit   [employer] (http://www.fleming.com/.htm) Information regarding doulas (labor support) who provide pool rentals  Our practice has a Birth Pool in a Box tub at the hospital that you may borrow on a first-come-first-served basis. It is your responsibility to to set up, clean and break down the tub. We cannot guarantee the availability of this tub in advance. You are responsible for bringing all accessories listed above. If you do not have all necessary supplies you cannot have a waterbirth.    Things that would prevent you from having a waterbirth:  Premature, <37wks  Previous cesarean birth  Presence of thick meconium-stained  fluid  Multiple gestation (Twins, triplets, etc.)  Uncontrolled diabetes or gestational diabetes requiring medication  Hypertension requiring medication or diagnosis of pre-eclampsia  Heavy vaginal bleeding  Non-reassuring fetal heart rate  Active infection (MRSA, etc.). Group B Strep is NOT a contraindication for  waterbirth.  If your labor has to be induced and induction method requires continuous  monitoring of the baby's heart rate  Other risks/issues identified by your obstetrical provider  Please remember that birth is unpredictable. Under certain unforeseeable circumstances your provider may advise against giving birth in the tub. These decisions will be made on a case-by-case basis and with the safety of you and your baby as our highest priority.     AREA PEDIATRIC/FAMILY [place] [address] [place], [place]  [address] Phone - [phone]   Fax - [phone]  [facility] [place] [address] [place], [place] [address] Phone - [phone]   Fax - [place] [address], [place]  [address] Phone - [phone]   Fax - [phone]  [place] [place] [address] [place], [place]  [address] Phone - [phone]   Fax - [phone]  [place] [address] [place], [place]  [address] Phone - [phone]   Fax - [phone]  [facility] [address] [place], [place]  [address] Phone - [phone]   Fax - [place] [address], [place], [place]  [address] Phone - [phone]   Fax - [phone]  [place] [address] [place], [place] [address] [place], [place]  [address] Phone - [phone]   Fax - [place] [address] [place], [place]  [address] Phone - [phone]    Fax - [phone] [facility] [place]. [address] [place], [place]  [address] Phone - [phone]   Fax - [phone]  [place] [address] [place], [place]  [address] Phone - [phone]   Fax - [phone]  [facility] [place], [name], [place]  [address] Phone - [phone]   Fax - [place] [address] [place], [place]  [address] Phone - [phone]   Fax - [phone]  [facility] [address], [place], [place]  [address] Phone - [place], [place]  [address] Phone - [phone]   Fax - [place] [address], [place], [place]  [address] Phone -  [phone]   Fax - [phone]  [place] [address] [place], [place]  [address] Phone - [phone]   Fax - [place], [place]  [address] Phone - [phone]   Fax - [place], [place], [place]  [address] Phone - [phone]   Fax - [place] [address], [place], [place]  [address] Phone - [phone]   Fax - [phone]  [place] [address], [place], [place]  [address] Phone - [phone]   Fax - [place] [address], [place], [place]  [address] Phone - [phone]   Fax - [phone]  [place] [address] [place], [place]  [address] Phone - [phone]   Fax - [phone] [place] [address] [place], [place]  [address] Phone - [phone]   Fax - [place] [address] [place], [place]  [address] Phone - [phone]   Fax -  [place] [address], [place], [place]  [address] Phone - [phone]   Fax - [phone]  [place] [address] [place] [address] Phone [phone]  Fax [phone]  Safe Medications in Pregnancy   Acne:  Benzoyl Peroxide  Salicylic Acid   Backache/Headache:  Tylenol: 2 regular strength every 4 hours OR        2 Extra strength every 6 hours   Colds/Coughs/Allergies:  Benadryl (alcohol free) 25 mg every 6 hours as needed  Breath right strips  Claritin  Cepacol throat lozenges  Chloraseptic throat spray  Cold-Eeze- up to three times per day  Cough drops, alcohol free  Flonase (by prescription only)  Guaifenesin  Mucinex  Robitussin DM (plain only, alcohol free)  Saline nasal spray/drops  Sudafed (pseudoephedrine) & Actifed * use only after [redacted] weeks gestation and if you do not have high blood pressure  Tylenol  Vicks Vaporub  Zinc lozenges  Zyrtec   Constipation:  Colace  Ducolax suppositories  Fleet enema  Glycerin suppositories  Metamucil  Milk of magnesia  Miralax  Senokot  Smooth move tea   Diarrhea:  Kaopectate  Imodium A-D   *NO pepto Bismol   Hemorrhoids:  Anusol  Anusol HC  Preparation H  Tucks   Indigestion:  Tums  Maalox  Mylanta  Zantac  Pepcid   Insomnia:  Benadryl (alcohol free) 5m every 6 hours as needed  Tylenol PM  Unisom, no Gelcaps   Leg Cramps:  Tums  MagGel   Nausea/Vomiting:  Bonine  Dramamine  Emetrol  Ginger extract  Sea bands  Meclizine  Nausea medication to take during pregnancy:  Unisom (doxylamine succinate 25 mg tablets) Take one tablet daily at bedtime. If symptoms are not adequately controlled, the dose can be increased to a maximum recommended dose of two tablets daily (1/2 tablet in the morning, 1/2 tablet mid-afternoon and one at bedtime).  Vitamin B6 1085mtablets.  Take one tablet twice a day (up to 200 mg per day).   Skin Rashes:  Aveeno products  Benadryl cream or 2543mvery 6 hours as needed  Calamine Lotion  1% cortisone cream   Yeast infection:  Gyne-lotrimin 7  Monistat 7    **If taking multiple medications, please check labels to avoid  duplicating the same active ingredients  **take medication as directed on the label  ** Do not exceed 4000 mg of tylenol in 24 hours  **Do not take medications that contain aspirin or ibuprofen

## 2017-05-30 ENCOUNTER — Telehealth: Payer: Self-pay

## 2017-05-30 LAB — OBSTETRIC PANEL, INCLUDING HIV
ANTIBODY SCREEN: NEGATIVE
BASOS: 0 %
Basophils Absolute: 0 10*3/uL (ref 0.0–0.2)
EOS (ABSOLUTE): 0.2 10*3/uL (ref 0.0–0.4)
Eos: 1 %
HEMATOCRIT: 33.5 % — AB (ref 34.0–46.6)
HIV SCREEN 4TH GENERATION: NONREACTIVE
Hemoglobin: 11.1 g/dL (ref 11.1–15.9)
Hepatitis B Surface Ag: NEGATIVE
IMMATURE GRANS (ABS): 0.1 10*3/uL (ref 0.0–0.1)
Immature Granulocytes: 1 %
LYMPHS: 23 %
Lymphocytes Absolute: 2.8 10*3/uL (ref 0.7–3.1)
MCH: 27.6 pg (ref 26.6–33.0)
MCHC: 33.1 g/dL (ref 31.5–35.7)
MCV: 83 fL (ref 79–97)
MONOS ABS: 0.7 10*3/uL (ref 0.1–0.9)
Monocytes: 6 %
NEUTROS PCT: 69 %
Neutrophils Absolute: 8.5 10*3/uL — ABNORMAL HIGH (ref 1.4–7.0)
Platelets: 288 10*3/uL (ref 150–379)
RBC: 4.02 x10E6/uL (ref 3.77–5.28)
RDW: 15 % (ref 12.3–15.4)
RPR Ser Ql: NONREACTIVE
Rh Factor: POSITIVE
Rubella Antibodies, IGG: 3.17 index (ref >0.99)
WBC: 12.1 10*3/uL — ABNORMAL HIGH (ref 3.4–10.8)

## 2017-05-30 NOTE — Telephone Encounter (Signed)
Called Pt to ask if she can come in to sign her [place] Prescription form so we can order her 17-p, she states  She will come in Friday 05/31/17.

## 2017-05-31 ENCOUNTER — Encounter: Payer: Self-pay | Admitting: *Deleted

## 2017-05-31 LAB — SPECIMEN STATUS REPORT

## 2017-05-31 LAB — TSH: TSH: 0.542 u[IU]/mL (ref 0.450–4.500)

## 2017-06-01 LAB — CYTOLOGY - PAP
CHLAMYDIA, DNA PROBE: NEGATIVE
DIAGNOSIS: NEGATIVE
HPV (WINDOPATH): NOT DETECTED
Neisseria Gonorrhea: NEGATIVE

## 2017-06-04 LAB — CYSTIC FIBROSIS GENE TEST

## 2017-06-05 ENCOUNTER — Ambulatory Visit (INDEPENDENT_AMBULATORY_CARE_PROVIDER_SITE_OTHER): Payer: Medicaid Other | Admitting: General Practice

## 2017-06-05 VITALS — BP 119/58 | HR 83 | Ht 63.0 in | Wt 224.0 lb

## 2017-06-05 DIAGNOSIS — O09212 Supervision of pregnancy with history of pre-term labor, second trimester: Secondary | ICD-10-CM

## 2017-06-05 DIAGNOSIS — O09892 Supervision of other high risk pregnancies, second trimester: Secondary | ICD-10-CM

## 2017-06-05 MED ORDER — HYDROXYPROGESTERONE CAPROATE 275 MG/1.1ML ~~LOC~~ SOAJ
275.0000 mg | Freq: Once | SUBCUTANEOUS | Status: AC
  Administered 2017-06-05: 275 mg via SUBCUTANEOUS

## 2017-06-05 NOTE — Progress Notes (Signed)
Patient seen and assessed by nursing staff.  Agree with documentation and plan.  

## 2017-06-05 NOTE — Progress Notes (Signed)
Jyl Heinz here for 17-P  Injection.  Injection administered without complication although patient was very tearful stating the injection burned. Patient will return in one week for next injection.  Derinda Late, RN 06/05/2017  4:04 PM

## 2017-06-07 LAB — SMN1 COPY NUMBER ANALYSIS (SMA CARRIER SCREENING)

## 2017-06-11 ENCOUNTER — Encounter: Payer: Self-pay | Admitting: *Deleted

## 2017-06-12 ENCOUNTER — Ambulatory Visit (INDEPENDENT_AMBULATORY_CARE_PROVIDER_SITE_OTHER): Payer: Medicaid Other

## 2017-06-12 VITALS — BP 112/67 | HR 84

## 2017-06-12 DIAGNOSIS — O09212 Supervision of pregnancy with history of pre-term labor, second trimester: Secondary | ICD-10-CM

## 2017-06-12 DIAGNOSIS — O09219 Supervision of pregnancy with history of pre-term labor, unspecified trimester: Principal | ICD-10-CM

## 2017-06-12 DIAGNOSIS — O09899 Supervision of other high risk pregnancies, unspecified trimester: Secondary | ICD-10-CM

## 2017-06-12 MED ORDER — HYDROXYPROGESTERONE CAPROATE 275 MG/1.1ML ~~LOC~~ SOAJ
275.0000 mg | Freq: Once | SUBCUTANEOUS | Status: AC
  Administered 2017-06-12: 275 mg via SUBCUTANEOUS

## 2017-06-12 NOTE — Progress Notes (Signed)
Pt presents to the office for 17p injection. Pt cried while injection was being administered. Next depo injection is due in one week.

## 2017-06-14 ENCOUNTER — Encounter (HOSPITAL_COMMUNITY): Payer: Self-pay | Admitting: Medical

## 2017-06-14 NOTE — Progress Notes (Signed)
Agree w/ POC w/ correction that pt will return for 17-P, not Depo in 1 week.  Tamala Julian, [place], [place] 06/14/2017 10:53 AM

## 2017-06-19 ENCOUNTER — Encounter: Payer: Self-pay | Admitting: Family Medicine

## 2017-06-19 ENCOUNTER — Ambulatory Visit: Payer: Medicaid Other | Admitting: *Deleted

## 2017-06-19 VITALS — BP 109/79 | HR 79

## 2017-06-19 DIAGNOSIS — O99210 Obesity complicating pregnancy, unspecified trimester: Secondary | ICD-10-CM | POA: Insufficient documentation

## 2017-06-19 DIAGNOSIS — O09892 Supervision of other high risk pregnancies, second trimester: Secondary | ICD-10-CM

## 2017-06-19 DIAGNOSIS — O09212 Supervision of pregnancy with history of pre-term labor, second trimester: Principal | ICD-10-CM

## 2017-06-19 MED ORDER — HYDROXYPROGESTERONE CAPROATE 250 MG/ML IM OIL: 250.0000 mg | TOPICAL_OIL | Freq: Once | INTRAMUSCULAR | 0 refills | Status: DC

## 2017-06-19 MED ORDER — HYDROXYPROGESTERONE CAPROATE 250 MG/ML IM OIL: 250.0000 mg | TOPICAL_OIL | INTRAMUSCULAR | 4 refills | Status: DC

## 2017-06-19 MED ORDER — HYDROXYPROGESTERONE CAPROATE 275 MG/1.1ML ~~LOC~~ SOAJ
275.0000 mg | Freq: Once | SUBCUTANEOUS | Status: AC
  Administered 2017-06-19: 275 mg via SUBCUTANEOUS

## 2017-06-19 NOTE — Progress Notes (Signed)
I have reviewed this chart and agree with the RN/CMA assessment and management.    K. Meryl Ayaan Ringle, M.D. Attending Obstetrician & Gynecologist, [facility], [facility]  

## 2017-06-19 NOTE — Progress Notes (Signed)
[name] here for 17-P  Injection.  Injection administered without complication. Patient will return in one week for next injection. Pt stated she wants to switch to the IM 17-p injections and has spoken with the [facility] rep about this. She was told to let us know to send the rx to [facility]. Order placed in [place]. Spoke with Levada Dy at [facility] in [place] and arranged shipment for arrival on or before 4/3 when next dose is due.Riccardo Dubin, RN 06/19/2017  10:46 AM

## 2017-06-20 ENCOUNTER — Ambulatory Visit (HOSPITAL_COMMUNITY)
Admission: RE | Admit: 2017-06-20 | Discharge: 2017-06-20 | Disposition: A | Discharge status: Home / Self Care | Payer: Medicaid Other | Source: Ambulatory Visit | Attending: Medical | Admitting: Medical

## 2017-06-20 ENCOUNTER — Other Ambulatory Visit: Payer: Self-pay | Admitting: Medical

## 2017-06-20 DIAGNOSIS — Z3689 Encounter for other specified antenatal screening: Secondary | ICD-10-CM | POA: Diagnosis present

## 2017-06-20 DIAGNOSIS — Z3A18 18 weeks gestation of pregnancy: Secondary | ICD-10-CM | POA: Insufficient documentation

## 2017-06-20 DIAGNOSIS — O99212 Obesity complicating pregnancy, second trimester: Secondary | ICD-10-CM | POA: Diagnosis not present

## 2017-06-20 DIAGNOSIS — O0992 Supervision of high risk pregnancy, unspecified, second trimester: Secondary | ICD-10-CM | POA: Insufficient documentation

## 2017-06-20 DIAGNOSIS — O09212 Supervision of pregnancy with history of pre-term labor, second trimester: Secondary | ICD-10-CM

## 2017-06-21 LAB — TSH: TSH: 0.514 u[IU]/mL (ref 0.450–4.500)

## 2017-06-21 LAB — SPECIMEN STATUS REPORT

## 2017-06-26 ENCOUNTER — Encounter: Payer: Self-pay | Admitting: Obstetrics and Gynecology

## 2017-06-26 ENCOUNTER — Ambulatory Visit: Payer: Medicaid Other | Admitting: Clinical

## 2017-06-26 ENCOUNTER — Ambulatory Visit (INDEPENDENT_AMBULATORY_CARE_PROVIDER_SITE_OTHER): Payer: Medicaid Other | Admitting: Obstetrics and Gynecology

## 2017-06-26 VITALS — BP 100/61 | HR 71 | Wt 225.0 lb

## 2017-06-26 DIAGNOSIS — O0992 Supervision of high risk pregnancy, unspecified, second trimester: Secondary | ICD-10-CM

## 2017-06-26 DIAGNOSIS — O09212 Supervision of pregnancy with history of pre-term labor, second trimester: Secondary | ICD-10-CM

## 2017-06-26 DIAGNOSIS — O09892 Supervision of other high risk pregnancies, second trimester: Secondary | ICD-10-CM

## 2017-06-26 DIAGNOSIS — O99210 Obesity complicating pregnancy, unspecified trimester: Secondary | ICD-10-CM

## 2017-06-26 MED ORDER — HYDROCORTISONE 1 % EX OINT: 1.0000 "application " | TOPICAL_OINTMENT | Freq: Two times a day (BID) | CUTANEOUS | 0 refills | Status: DC

## 2017-06-26 MED ORDER — HYDROXYPROGESTERONE CAPROATE 250 MG/ML IM OIL
250.0000 mg | TOPICAL_OIL | INTRAMUSCULAR | Status: AC
  Administered 2017-06-26 – 2017-10-18 (×14): 250 mg via INTRAMUSCULAR

## 2017-06-26 NOTE — Progress Notes (Signed)
   PRENATAL VISIT NOTE  Subjective:  [name] is a 32 y.o. [phone] at [redacted]w[redacted]d being seen today for ongoing prenatal care.  She is currently monitored for the following issues for this high-risk pregnancy and has Obesity; Migraine; Asthma; Chronic pain syndrome; Neck pain; Back pain; Osteoarthritis; Herniated nucleus pulposus, L4-5 left; Herniated nucleus pulposus; Supervision of high risk pregnancy, antepartum, second trimester; History of preterm delivery, currently pregnant in second trimester; Tobacco use in pregnancy, antepartum, second trimester; Drug use affecting pregnancy in second trimester; and Obesity affecting pregnancy, antepartum on their problem list.  Patient reports presence of a skin rash on right forearm.  Contractions: Not present. Vag. Bleeding: None.  Movement: Present. Denies leaking of fluid.   The following portions of the patient's history were reviewed and updated as appropriate: allergies, current medications, past family history, past medical history, past social history, past surgical history and problem list. Problem list updated.  Objective:   Vitals:   06/26/17 1032  BP: 100/61  Pulse: 71  Weight: 225 lb (102.1 kg)    Fetal Status: Fetal Heart Rate (bpm): 148   Movement: Present     General:  Alert, oriented and cooperative. Patient is in no acute distress.  Skin: Skin is warm and dry. No rash noted.   Cardiovascular: Normal heart rate noted  Respiratory: Normal respiratory effort, no problems with respiration noted  Abdomen: Soft, gravid, appropriate for gestational age.  Pain/Pressure: Present     Pelvic: Cervical exam deferred        Extremities: Normal range of motion.  Edema: Trace  Mental Status: Normal mood and affect. Normal behavior. Normal judgment and thought content.   Assessment and Plan:  Pregnancy: [id number] at [redacted]w[redacted]d  1. Supervision of high risk pregnancy, antepartum, second trimester Patient is doing well Rc hydrocortisone  ointment provided for arm rash AFP today Follow up anatomy ultrasound - [place] MFM OB FOLLOW UP; Future - AFP, Serum, Open Spina Bifida - hydroxyprogesterone caproate (MAKENA) 250 mg/mL injection 250 mg  2. History of preterm delivery, currently pregnant in second trimester Continue weekly 17-P - hydroxyprogesterone caproate (MAKENA) 250 mg/mL injection 250 mg  3. Obesity affecting pregnancy, antepartum Continue ASA  Preterm labor symptoms and general obstetric precautions including but not limited to vaginal bleeding, contractions, leaking of fluid and fetal movement were reviewed in detail with the patient. Please refer to After Visit Summary for other counseling recommendations.  Return in about 1 month (around 07/24/2017) for ROB, weekly for 17-P.  Future Appointments  Date Time Provider [place] [facility]  06/26/2017 11:00 AM [place]  07/23/2017 11:30 AM [place] 5 WH-MFCUS MFC-US    Mora Bellman, MD

## 2017-06-26 NOTE — BH Specialist Note (Deleted)
Integrated Behavioral Health Initial Visit  MRN: [id number] Name: [name]  Number of [place] Clinician visits:: 1/6 Session Start time: ***  Session End time: *** Total time: {IBH Total Time:21014050}  Type of Service: [place] Interpretor:No. Interpretor Name and Language: n/a   Warm Hand Off Completed.       SUBJECTIVE: [name] is a 32 y.o. female accompanied by {CHL AMB ACCOMPANIED BY:[phone]} Patient was referred by Dr Elly Modena for high risk pregnancy(tobacco, drug use affecting pregnancy, severe obesity, chronic pain) Patient reports the following symptoms/concerns: *** Duration of problem: ***; Severity of problem: {Mild/Moderate/Severe:20260}  OBJECTIVE: Mood: {BHH MOOD:22306} and Affect: {BHH AFFECT:22307} Risk of harm to self or others: No plan to harm self or others  LIFE CONTEXT: Family and Social: *** School/Work: *** Self-Care: *** Life Changes: Current pregnancy ***  GOALS ADDRESSED: Patient will: 1. Reduce symptoms of: {IBH Symptoms:21014056} 2. Increase knowledge and/or ability of: {IBH Patient Tools:21014057}  3. Demonstrate ability to: {IBH Goals:21014053}  INTERVENTIONS: Interventions utilized: {IBH Interventions:21014054}  Standardized Assessments completed: GAD-7 and PHQ 9  ASSESSMENT: Patient currently experiencing ***.   Patient may benefit from psychoeducation and brief therapeutic interventions regarding coping with symptoms of *** .  PLAN: 1. Follow up with behavioral health clinician on : *** 2. Behavioral recommendations: *** 3. Referral(s): {IBH Referrals:21014055} 4. "From scale of 1-10, how likely are you to follow plan?": ***  Garlan Fair, LCSW    Depression screen [facility] 2/9 05/29/2017 11/07/2012  Decreased Interest 1 0  Down, Depressed, Hopeless 1 0  PHQ - 2 Score 2 0  Altered sleeping 1 -  Tired, decreased energy 1 -  Change in  appetite 1 -  Feeling bad or failure about yourself  1 -  Trouble concentrating 0 -  Moving slowly or fidgety/restless 0 -  Suicidal thoughts 0 -  PHQ-9 Score 6 -   GAD 7 : Generalized Anxiety Score 05/29/2017  Nervous, Anxious, on Edge 1  Control/stop worrying 1  Worry too much - different things 1  Trouble relaxing 1  Restless 1  Easily annoyed or irritable 1  Afraid - awful might happen 0  Total GAD 7 Score 6

## 2017-06-27 ENCOUNTER — Institutional Professional Consult (permissible substitution): Payer: Medicaid Other

## 2017-06-27 NOTE — BH Specialist Note (Deleted)
Integrated Behavioral Health Initial Visit  MRN: [id number] Name: [name]  Number of [place] Clinician visits:: 1/6 Session Start time: ***  Session End time: *** Total time: {IBH Total Time:21014050}  Type of Service: [place] Interpretor:No. Interpretor Name and Language: n/a   Warm Hand Off Completed.       SUBJECTIVE: [name] is a 32 y.o. female accompanied by {CHL AMB ACCOMPANIED BY:[phone]} Patient was referred by Dr Elly Modena for high-risk pregnancy  Patient reports the following symptoms/concerns: *** Duration of problem: ***; Severity of problem: {Mild/Moderate/Severe:20260}  OBJECTIVE: Mood: {BHH MOOD:22306} and Affect: {BHH AFFECT:22307} Risk of harm to self or others: No plan to harm self or others  LIFE CONTEXT: Family and Social: *** School/Work: *** Self-Care: *** Life Changes: Current pregnancy  GOALS ADDRESSED: Patient will: 1. Reduce symptoms of: {IBH Symptoms:21014056} 2. Increase knowledge and/or ability of: {IBH Patient Tools:21014057}  3. Demonstrate ability to: {IBH Goals:21014053}  INTERVENTIONS: Interventions utilized: {IBH Interventions:21014054}  Standardized Assessments completed: {IBH Screening Tools:21014051}  ASSESSMENT: Patient currently experiencing ***.   Patient may benefit from ***.  PLAN: 1. Follow up with behavioral health clinician on : *** 2. Behavioral recommendations: *** 3. Referral(s): {IBH Referrals:21014055} 4. "From scale of 1-10, how likely are you to follow plan?": ***  Caroleen Hamman McMannes, LCSW

## 2017-06-28 LAB — AFP, SERUM, OPEN SPINA BIFIDA
AFP MoM: 1.12
AFP Value: 42.9 ng/mL
GEST. AGE ON COLLECTION DATE: 19.1 wk
Maternal Age At EDD: 32.9 yr
OSBR RISK 1 IN: 8371
Test Results:: NEGATIVE
WEIGHT: 225 [lb_av]

## 2017-07-03 ENCOUNTER — Ambulatory Visit (INDEPENDENT_AMBULATORY_CARE_PROVIDER_SITE_OTHER): Payer: Medicaid Other | Admitting: General Practice

## 2017-07-03 VITALS — BP 91/52 | HR 93 | Ht 63.0 in | Wt 225.0 lb

## 2017-07-03 DIAGNOSIS — O09212 Supervision of pregnancy with history of pre-term labor, second trimester: Secondary | ICD-10-CM | POA: Diagnosis not present

## 2017-07-03 DIAGNOSIS — O09892 Supervision of other high risk pregnancies, second trimester: Secondary | ICD-10-CM

## 2017-07-03 NOTE — Progress Notes (Signed)
Erroneous encounter

## 2017-07-03 NOTE — Progress Notes (Signed)
Jyl Heinz here for 17-P  Injection.  Injection administered without complication. Patient will return in one week for next injection.  Derinda Late, RN 07/03/2017  2:00 PM

## 2017-07-04 NOTE — Progress Notes (Signed)
I have reviewed the chart and agree with nursing staff's documentation of this patient's encounter.  Aletha Halim, MD 07/04/2017 1:17 PM

## 2017-07-10 ENCOUNTER — Ambulatory Visit (INDEPENDENT_AMBULATORY_CARE_PROVIDER_SITE_OTHER): Payer: Medicaid Other | Admitting: General Practice

## 2017-07-10 VITALS — BP 109/53 | HR 74 | Ht 63.0 in | Wt 224.0 lb

## 2017-07-10 DIAGNOSIS — O09212 Supervision of pregnancy with history of pre-term labor, second trimester: Secondary | ICD-10-CM | POA: Diagnosis not present

## 2017-07-10 DIAGNOSIS — O09892 Supervision of other high risk pregnancies, second trimester: Secondary | ICD-10-CM

## 2017-07-10 NOTE — Progress Notes (Signed)
Jyl Heinz here for 17-P  Injection.  Injection administered without complication. Patient will return in one week for next injection.  Derinda Late, RN 07/10/2017  1:16 PM

## 2017-07-17 ENCOUNTER — Ambulatory Visit (INDEPENDENT_AMBULATORY_CARE_PROVIDER_SITE_OTHER): Payer: Medicaid Other | Admitting: General Practice

## 2017-07-17 VITALS — BP 102/62 | HR 69 | Ht 63.0 in | Wt 224.0 lb

## 2017-07-17 DIAGNOSIS — O09212 Supervision of pregnancy with history of pre-term labor, second trimester: Secondary | ICD-10-CM | POA: Diagnosis present

## 2017-07-17 DIAGNOSIS — O09892 Supervision of other high risk pregnancies, second trimester: Secondary | ICD-10-CM

## 2017-07-17 NOTE — Progress Notes (Signed)
I have reviewed the chart and agree with nursing staff's documentation of this patient's encounter.  Kerry Hough, PA-C 07/17/2017 11:02 AM

## 2017-07-17 NOTE — Progress Notes (Signed)
Jyl Heinz here for 17-P  Injection.  Injection administered without complication. Patient will return in one week for next injection.  Derinda Late, RN 07/17/2017  10:21 AM

## 2017-07-23 ENCOUNTER — Other Ambulatory Visit: Payer: Self-pay | Admitting: Obstetrics and Gynecology

## 2017-07-23 ENCOUNTER — Ambulatory Visit (HOSPITAL_COMMUNITY)
Admission: RE | Admit: 2017-07-23 | Discharge: 2017-07-23 | Disposition: A | Discharge status: Home / Self Care | Payer: Medicaid Other | Source: Ambulatory Visit | Attending: Medical | Admitting: Medical

## 2017-07-23 DIAGNOSIS — O09292 Supervision of pregnancy with other poor reproductive or obstetric history, second trimester: Secondary | ICD-10-CM

## 2017-07-23 DIAGNOSIS — O09212 Supervision of pregnancy with history of pre-term labor, second trimester: Secondary | ICD-10-CM | POA: Diagnosis present

## 2017-07-23 DIAGNOSIS — O0992 Supervision of high risk pregnancy, unspecified, second trimester: Secondary | ICD-10-CM

## 2017-07-23 DIAGNOSIS — Z3A23 23 weeks gestation of pregnancy: Secondary | ICD-10-CM | POA: Diagnosis not present

## 2017-07-23 DIAGNOSIS — Z3689 Encounter for other specified antenatal screening: Secondary | ICD-10-CM | POA: Diagnosis not present

## 2017-07-26 ENCOUNTER — Ambulatory Visit (INDEPENDENT_AMBULATORY_CARE_PROVIDER_SITE_OTHER): Payer: Medicaid Other | Admitting: Obstetrics & Gynecology

## 2017-07-26 VITALS — BP 97/67 | HR 86 | Wt 219.1 lb

## 2017-07-26 DIAGNOSIS — O0992 Supervision of high risk pregnancy, unspecified, second trimester: Secondary | ICD-10-CM | POA: Diagnosis present

## 2017-07-26 DIAGNOSIS — J4540 Moderate persistent asthma, uncomplicated: Secondary | ICD-10-CM | POA: Diagnosis not present

## 2017-07-26 DIAGNOSIS — O09212 Supervision of pregnancy with history of pre-term labor, second trimester: Secondary | ICD-10-CM

## 2017-07-26 DIAGNOSIS — J309 Allergic rhinitis, unspecified: Secondary | ICD-10-CM | POA: Insufficient documentation

## 2017-07-26 DIAGNOSIS — J301 Allergic rhinitis due to pollen: Secondary | ICD-10-CM

## 2017-07-26 DIAGNOSIS — O09892 Supervision of other high risk pregnancies, second trimester: Secondary | ICD-10-CM

## 2017-07-26 MED ORDER — PANTOPRAZOLE SODIUM 20 MG PO TBEC: 20.0000 mg | DELAYED_RELEASE_TABLET | Freq: Every day | ORAL | 1 refills | Status: DC

## 2017-07-26 MED ORDER — HYDROXYPROGESTERONE CAPROATE 250 MG/ML IM OIL
250.0000 mg | TOPICAL_OIL | Freq: Once | INTRAMUSCULAR | Status: AC
  Administered 2017-07-26: 250 mg via INTRAMUSCULAR

## 2017-07-26 MED ORDER — FLONASE 50 MCG/ACT NA SUSP: 1.0000 | Freq: Every day | NASAL | 2 refills | Status: DC

## 2017-07-26 MED ORDER — BECLOMETHASONE DIPROP HFA 40 MCG/ACT IN AERB: 1.0000 | INHALATION_SPRAY | Freq: Two times a day (BID) | RESPIRATORY_TRACT | 1 refills | Status: DC

## 2017-07-26 NOTE — Patient Instructions (Signed)
Second Trimester of Pregnancy The second trimester is from week 13 through week 28, month 4 through 6. This is often the time in pregnancy that you feel your best. Often times, morning sickness has lessened or quit. You may have more energy, and you may get hungry more often. Your unborn baby (fetus) is growing rapidly. At the end of the sixth month, he or she is about 9 inches long and weighs about 1 pounds. You will likely feel the baby move (quickening) between 18 and 20 weeks of pregnancy. Follow these instructions at home:  Avoid all smoking, herbs, and alcohol. Avoid drugs not approved by your doctor.  Do not use any tobacco products, including cigarettes, chewing tobacco, and electronic cigarettes. If you need help quitting, ask your doctor. You may get counseling or other support to help you quit.  Only take medicine as told by your doctor. Some medicines are safe and some are not during pregnancy.  Exercise only as told by your doctor. Stop exercising if you start having cramps.  Eat regular, healthy meals.  Wear a good support bra if your breasts are tender.  Do not use hot tubs, steam rooms, or saunas.  Wear your seat belt when driving.  Avoid raw meat, uncooked cheese, and liter boxes and soil used by cats.  Take your prenatal vitamins.  Take 1500-2000 milligrams of calcium daily starting at the 20th week of pregnancy until you deliver your baby.  Try taking medicine that helps you poop (stool softener) as needed, and if your doctor approves. Eat more fiber by eating fresh fruit, vegetables, and whole grains. Drink enough fluids to keep your pee (urine) clear or pale yellow.  Take warm water baths (sitz baths) to soothe pain or discomfort caused by hemorrhoids. Use hemorrhoid cream if your doctor approves.  If you have puffy, bulging veins (varicose veins), wear support hose. Raise (elevate) your feet for 15 minutes, 3-4 times a day. Limit salt in your diet.  Avoid heavy  lifting, wear low heals, and sit up straight.  Rest with your legs raised if you have leg cramps or low back pain.  Visit your dentist if you have not gone during your pregnancy. Use a soft toothbrush to brush your teeth. Be gentle when you floss.  You can have sex (intercourse) unless your doctor tells you not to.  Go to your doctor visits. Get help if:  You feel dizzy.  You have mild cramps or pressure in your lower belly (abdomen).  You have a nagging pain in your belly area.  You continue to feel sick to your stomach (nauseous), throw up (vomit), or have watery poop (diarrhea).  You have bad smelling fluid coming from your vagina.  You have pain with peeing (urination). Get help right away if:  You have a fever.  You are leaking fluid from your vagina.  You have spotting or bleeding from your vagina.  You have severe belly cramping or pain.  You lose or gain weight rapidly.  You have trouble catching your breath and have chest pain.  You notice sudden or extreme puffiness (swelling) of your face, hands, ankles, feet, or legs.  You have not felt the baby move in over an hour.  You have severe headaches that do not go away with medicine.  You have vision changes. This information is not intended to replace advice given to you by your health care provider. Make sure you discuss any questions you have with your health care   provider. Document Released: 06/06/2009 Document Revised: 08/18/2015 Document Reviewed: 05/13/2012 Elsevier Interactive Patient Education  2017 Elsevier Inc.  

## 2017-07-26 NOTE — Progress Notes (Signed)
   PRENATAL VISIT NOTE  Subjective:  [name] is a 32 y.o. [phone] at [redacted]w[redacted]d being seen today for ongoing prenatal care.  She is currently monitored for the following issues for this high-risk pregnancy and has Obesity; Migraine; Asthma; Chronic pain syndrome; Neck pain; Back pain; Osteoarthritis; Herniated nucleus pulposus, L4-5 left; Herniated nucleus pulposus; Supervision of high risk pregnancy, antepartum, second trimester; History of preterm delivery, currently pregnant in second trimester; Tobacco use in pregnancy, antepartum, second trimester; Drug use affecting pregnancy in second trimester; Obesity affecting pregnancy, antepartum; and Nasal inflammation due to allergen on their problem list.  Patient reports cough, congestion, nasal sx, using inhaler frequently.  Contractions: Not present. Vag. Bleeding: None.  Movement: Present. Denies leaking of fluid.   The following portions of the patient's history were reviewed and updated as appropriate: allergies, current medications, past family history, past medical history, past social history, past surgical history and problem list. Problem list updated.  Objective:   Vitals:   07/26/17 1017  BP: 97/67  Pulse: 86  Weight: 219 lb 1.6 oz (99.4 kg)    Fetal Status: Fetal Heart Rate (bpm): 149   Movement: Present     General:  Alert, oriented and cooperative. Patient is in no acute distress.  Skin: Skin is warm and dry. No rash noted.   Cardiovascular: Normal heart rate noted  Respiratory: Normal respiratory effort, no problems with respiration noted  Abdomen: Soft, gravid, appropriate for gestational age.  Pain/Pressure: Present     Pelvic: Cervical exam deferred        Extremities: Normal range of motion.  Edema: Mild pitting, slight indentation  Mental Status: Normal mood and affect. Normal behavior. Normal judgment and thought content.   Assessment and Plan:  Pregnancy: [id number] at [redacted]w[redacted]d  1. History of preterm  delivery, currently pregnant in second trimester Weekly inljection - hydroxyprogesterone caproate (MAKENA) 250 mg/mL injection 250 mg  2. Supervision of high risk pregnancy, antepartum, second trimester  - FLONASE 50 MCG/ACT nasal spray; Place 1 spray into both nostrils daily.  Dispense: 9.9 g; Refill: 2 - [place] MFM OB FOLLOW UP; Future  3. Moderate persistent asthma, unspecified whether complicated  - beclomethasone (QVAR REDIHALER) 40 MCG/ACT inhaler; Inhale 1 puff into the lungs 2 (two) times daily.  Dispense: 1 Inhaler; Refill: 1 - pantoprazole (PROTONIX) 20 MG tablet; Take 1 tablet (20 mg total) by mouth daily.  Dispense: 30 tablet; Refill: 1  4. Seasonal allergic rhinitis due to pollen  - FLONASE 50 MCG/ACT nasal spray; Place 1 spray into both nostrils daily.  Dispense: 9.9 g; Refill: 2  Preterm labor symptoms and general obstetric precautions including but not limited to vaginal bleeding, contractions, leaking of fluid and fetal movement were reviewed in detail with the patient. Please refer to After Visit Summary for other counseling recommendations.  Return in about 1 month (around 08/23/2017) for 2 hr GTT.  No future appointments.  Emeterio Reeve, MD

## 2017-07-26 NOTE — Progress Notes (Signed)
Pt states having real bad allergeys, nothing is working,meds not working. Breathing treatments..nothing.Want to know if we can give her a Allergy shot.

## 2017-08-02 ENCOUNTER — Ambulatory Visit (INDEPENDENT_AMBULATORY_CARE_PROVIDER_SITE_OTHER): Payer: Medicaid Other

## 2017-08-02 DIAGNOSIS — O09212 Supervision of pregnancy with history of pre-term labor, second trimester: Secondary | ICD-10-CM

## 2017-08-02 DIAGNOSIS — O09892 Supervision of other high risk pregnancies, second trimester: Secondary | ICD-10-CM

## 2017-08-02 MED ORDER — MEDROXYPROGESTERONE ACETATE 150 MG/ML IM SUSP
150.0000 mg | Freq: Once | INTRAMUSCULAR | Status: AC
  Administered 2017-08-02: 150 mg via INTRAMUSCULAR

## 2017-08-02 NOTE — Progress Notes (Signed)
Pt here for Depo shot,took well.

## 2017-08-09 ENCOUNTER — Ambulatory Visit (INDEPENDENT_AMBULATORY_CARE_PROVIDER_SITE_OTHER): Payer: Medicaid Other | Admitting: *Deleted

## 2017-08-09 VITALS — BP 104/56 | HR 75 | Wt 221.1 lb

## 2017-08-09 DIAGNOSIS — O09212 Supervision of pregnancy with history of pre-term labor, second trimester: Secondary | ICD-10-CM | POA: Diagnosis not present

## 2017-08-09 DIAGNOSIS — O09892 Supervision of other high risk pregnancies, second trimester: Secondary | ICD-10-CM

## 2017-08-09 MED ORDER — ESOMEPRAZOLE MAGNESIUM 40 MG PO CPDR: 40.0000 mg | DELAYED_RELEASE_CAPSULE | Freq: Every day | ORAL | 3 refills | Status: DC

## 2017-08-09 NOTE — Progress Notes (Signed)
Pt reports that Protonix is not working for her acid reflux.  She requests Nexium - Rx approved by Dr. Ilda Basset. Pt also states she was told to drink Ensure due to poor weight gain and requests Rx so that [facility] will cover. Chart reviewed by Dr. Ilda Basset - since pt has had weight gain since last visit, she was advised to hold off on the Ensure for now. Pt voiced understanding and agreed to plan of care.  17P injection administered as scheduled - pt tolerated well.

## 2017-08-13 NOTE — Progress Notes (Signed)
I have reviewed the chart and agree with nursing staff's documentation of this patient's encounter.  Aletha Halim, MD 08/13/2017 12:52 PM

## 2017-08-16 ENCOUNTER — Ambulatory Visit (INDEPENDENT_AMBULATORY_CARE_PROVIDER_SITE_OTHER): Payer: Medicaid Other | Admitting: *Deleted

## 2017-08-16 VITALS — BP 112/65 | HR 77 | Wt 224.6 lb

## 2017-08-16 DIAGNOSIS — O09212 Supervision of pregnancy with history of pre-term labor, second trimester: Secondary | ICD-10-CM

## 2017-08-16 DIAGNOSIS — O09899 Supervision of other high risk pregnancies, unspecified trimester: Secondary | ICD-10-CM

## 2017-08-16 DIAGNOSIS — O09219 Supervision of pregnancy with history of pre-term labor, unspecified trimester: Principal | ICD-10-CM

## 2017-08-16 NOTE — Progress Notes (Signed)
17P 250 mg administered as scheduled.  Pt tolerated well. Next appt scheduled on 5/31 for 17P, HOB and [place] growth

## 2017-08-23 ENCOUNTER — Ambulatory Visit (INDEPENDENT_AMBULATORY_CARE_PROVIDER_SITE_OTHER): Payer: Medicaid Other | Admitting: Obstetrics and Gynecology

## 2017-08-23 ENCOUNTER — Ambulatory Visit (HOSPITAL_COMMUNITY)
Admission: RE | Admit: 2017-08-23 | Discharge: 2017-08-23 | Disposition: A | Discharge status: Home / Self Care | Payer: Medicaid Other | Source: Ambulatory Visit | Attending: Obstetrics & Gynecology | Admitting: Obstetrics & Gynecology

## 2017-08-23 ENCOUNTER — Encounter: Payer: Self-pay | Admitting: Obstetrics and Gynecology

## 2017-08-23 ENCOUNTER — Ambulatory Visit: Payer: Medicaid Other | Admitting: *Deleted

## 2017-08-23 ENCOUNTER — Other Ambulatory Visit: Payer: Self-pay | Admitting: Obstetrics & Gynecology

## 2017-08-23 VITALS — BP 106/70 | HR 88 | Wt 223.6 lb

## 2017-08-23 DIAGNOSIS — O99332 Smoking (tobacco) complicating pregnancy, second trimester: Secondary | ICD-10-CM | POA: Insufficient documentation

## 2017-08-23 DIAGNOSIS — Z23 Encounter for immunization: Secondary | ICD-10-CM | POA: Diagnosis not present

## 2017-08-23 DIAGNOSIS — O09892 Supervision of other high risk pregnancies, second trimester: Secondary | ICD-10-CM

## 2017-08-23 DIAGNOSIS — F17200 Nicotine dependence, unspecified, uncomplicated: Secondary | ICD-10-CM | POA: Diagnosis not present

## 2017-08-23 DIAGNOSIS — O0992 Supervision of high risk pregnancy, unspecified, second trimester: Secondary | ICD-10-CM | POA: Diagnosis not present

## 2017-08-23 DIAGNOSIS — Z3A27 27 weeks gestation of pregnancy: Secondary | ICD-10-CM | POA: Insufficient documentation

## 2017-08-23 DIAGNOSIS — O99322 Drug use complicating pregnancy, second trimester: Secondary | ICD-10-CM | POA: Diagnosis not present

## 2017-08-23 DIAGNOSIS — O99212 Obesity complicating pregnancy, second trimester: Secondary | ICD-10-CM

## 2017-08-23 DIAGNOSIS — O09212 Supervision of pregnancy with history of pre-term labor, second trimester: Secondary | ICD-10-CM | POA: Diagnosis present

## 2017-08-23 DIAGNOSIS — E669 Obesity, unspecified: Secondary | ICD-10-CM | POA: Diagnosis not present

## 2017-08-23 DIAGNOSIS — Z3009 Encounter for other general counseling and advice on contraception: Secondary | ICD-10-CM

## 2017-08-23 DIAGNOSIS — J4540 Moderate persistent asthma, uncomplicated: Secondary | ICD-10-CM

## 2017-08-23 DIAGNOSIS — Z362 Encounter for other antenatal screening follow-up: Secondary | ICD-10-CM | POA: Diagnosis present

## 2017-08-23 MED ORDER — BECLOMETHASONE DIPROP HFA 40 MCG/ACT IN AERB: 1.0000 | INHALATION_SPRAY | Freq: Two times a day (BID) | RESPIRATORY_TRACT | 1 refills | Status: DC

## 2017-08-23 NOTE — Progress Notes (Signed)
Subjective:  [name] is a 32 y.o. [phone] at [redacted]w[redacted]d being seen today for ongoing prenatal care.  She is currently monitored for the following issues for this high-risk pregnancy and has Obesity; Migraine; Asthma; Chronic pain syndrome; Neck pain; Back pain; Osteoarthritis; Herniated nucleus pulposus, L4-5 left; Herniated nucleus pulposus; Supervision of high risk pregnancy, antepartum, second trimester; History of preterm delivery, currently pregnant in second trimester; Tobacco use in pregnancy, antepartum, second trimester; Drug use affecting pregnancy in second trimester; Obesity affecting pregnancy, antepartum; Nasal inflammation due to allergen; and Unwanted fertility on their problem list.  Patient reports occasional contractions.  Contractions: Irritability. Vag. Bleeding: None.  Movement: Present. Denies leaking of fluid.   The following portions of the patient's history were reviewed and updated as appropriate: allergies, current medications, past family history, past medical history, past social history, past surgical history and problem list. Problem list updated.  Objective:   Vitals:   08/23/17 0852  BP: 106/70  Pulse: 88  Weight: 223 lb 9.6 oz (101.4 kg)    Fetal Status: Fetal Heart Rate (bpm): 148   Movement: Present     General:  Alert, oriented and cooperative. Patient is in no acute distress.  Skin: Skin is warm and dry. No rash noted.   Cardiovascular: Normal heart rate noted  Respiratory: Normal respiratory effort, no problems with respiration noted  Abdomen: Soft, gravid, appropriate for gestational age. Pain/Pressure: Present     Pelvic:  Cervical exam deferred        Extremities: Normal range of motion.  Edema: Mild pitting, slight indentation  Mental Status: Normal mood and affect. Normal behavior. Normal judgment and thought content.   Urinalysis:      Assessment and Plan:  Pregnancy: [id number] at [redacted]w[redacted]d  1. Supervision of high risk pregnancy,  antepartum, second trimester Stable Glucola today - Tdap vaccine greater than or equal to 7yo IM  2. History of preterm delivery, currently pregnant in second trimester Stable Continue with 17 OHP weekly Growth scan today  3. Moderate persistent asthma, unspecified whether complicated Stable - beclomethasone (QVAR REDIHALER) 40 MCG/ACT inhaler; Inhale 1 puff into the lungs 2 (two) times daily.  Dispense: 1 Inhaler; Refill: 1  4. Unwanted fertility BTL papers signed today  Preterm labor symptoms and general obstetric precautions including but not limited to vaginal bleeding, contractions, leaking of fluid and fetal movement were reviewed in detail with the patient. Please refer to After Visit Summary for other counseling recommendations.  Return in about 2 weeks (around 09/06/2017) for OB visit.   Chancy Milroy, MD

## 2017-08-23 NOTE — Patient Instructions (Signed)
Third Trimester of Pregnancy The third trimester is from week 28 through week 40 (months 7 through 9). The third trimester is a time when the unborn baby (fetus) is growing rapidly. At the end of the ninth month, the fetus is about 20 inches in length and weighs 6-10 pounds. Body changes during your third trimester Your body will continue to go through many changes during pregnancy. The changes vary from woman to woman. During the third trimester:  Your weight will continue to increase. You can expect to gain 25-35 pounds (11-16 kg) by the end of the pregnancy.  You may begin to get stretch marks on your hips, abdomen, and breasts.  You may urinate more often because the fetus is moving lower into your pelvis and pressing on your bladder.  You may develop or continue to have heartburn. This is caused by increased hormones that slow down muscles in the digestive tract.  You may develop or continue to have constipation because increased hormones slow digestion and cause the muscles that push waste through your intestines to relax.  You may develop hemorrhoids. These are swollen veins (varicose veins) in the rectum that can itch or be painful.  You may develop swollen, bulging veins (varicose veins) in your legs.  You may have increased body aches in the pelvis, back, or thighs. This is due to weight gain and increased hormones that are relaxing your joints.  You may have changes in your hair. These can include thickening of your hair, rapid growth, and changes in texture. Some women also have hair loss during or after pregnancy, or hair that feels dry or thin. Your hair will most likely return to normal after your baby is born.  Your breasts will continue to grow and they will continue to become tender. A yellow fluid (colostrum) may leak from your breasts. This is the first milk you are producing for your baby.  Your belly button may stick out.  You may notice more swelling in your hands,  face, or ankles.  You may have increased tingling or numbness in your hands, arms, and legs. The skin on your belly may also feel numb.  You may feel short of breath because of your expanding uterus.  You may have more problems sleeping. This can be caused by the size of your belly, increased need to urinate, and an increase in your body's metabolism.  You may notice the fetus "dropping," or moving lower in your abdomen (lightening).  You may have increased vaginal discharge.  You may notice your joints feel loose and you may have pain around your pelvic bone.  What to expect at prenatal visits You will have prenatal exams every 2 weeks until week 36. Then you will have weekly prenatal exams. During a routine prenatal visit:  You will be weighed to make sure you and the baby are growing normally.  Your blood pressure will be taken.  Your abdomen will be measured to track your baby's growth.  The fetal heartbeat will be listened to.  Any test results from the previous visit will be discussed.  You may have a cervical check near your due date to see if your cervix has softened or thinned (effaced).  You will be tested for Group B streptococcus. This happens between 35 and 37 weeks.  Your health care provider may ask you:  What your birth plan is.  How you are feeling.  If you are feeling the baby move.  If you have had   any abnormal symptoms, such as leaking fluid, bleeding, severe headaches, or abdominal cramping.  If you are using any tobacco products, including cigarettes, chewing tobacco, and electronic cigarettes.  If you have any questions.  Other tests or screenings that may be performed during your third trimester include:  Blood tests that check for low iron levels (anemia).  Fetal testing to check the health, activity level, and growth of the fetus. Testing is done if you have certain medical conditions or if there are problems during the  pregnancy.  Nonstress test (NST). This test checks the health of your baby to make sure there are no signs of problems, such as the baby not getting enough oxygen. During this test, a belt is placed around your belly. The baby is made to move, and its heart rate is monitored during movement.  What is false labor? False labor is a condition in which you feel small, irregular tightenings of the muscles in the womb (contractions) that usually go away with rest, changing position, or drinking water. These are called Braxton Hicks contractions. Contractions may last for hours, days, or even weeks before true labor sets in. If contractions come at regular intervals, become more frequent, increase in intensity, or become painful, you should see your health care provider. What are the signs of labor?  Abdominal cramps.  Regular contractions that start at 10 minutes apart and become stronger and more frequent with time.  Contractions that start on the top of the uterus and spread down to the lower abdomen and back.  Increased pelvic pressure and dull back pain.  A watery or bloody mucus discharge that comes from the vagina.  Leaking of amniotic fluid. This is also known as your "water breaking." It could be a slow trickle or a gush. Let your health care provider know if it has a color or strange odor. If you have any of these signs, call your health care provider right away, even if it is before your due date. Follow these instructions at home: Medicines  Follow your health care provider's instructions regarding medicine use. Specific medicines may be either safe or unsafe to take during pregnancy.  Take a prenatal vitamin that contains at least 600 micrograms (mcg) of folic acid.  If you develop constipation, try taking a stool softener if your health care provider approves. Eating and drinking  Eat a balanced diet that includes fresh fruits and vegetables, whole grains, good sources of protein  such as meat, eggs, or tofu, and low-fat dairy. Your health care provider will help you determine the amount of weight gain that is right for you.  Avoid raw meat and uncooked cheese. These carry germs that can cause birth defects in the baby.  If you have low calcium intake from food, talk to your health care provider about whether you should take a daily calcium supplement.  Eat four or five small meals rather than three large meals a day.  Limit foods that are high in fat and processed sugars, such as fried and sweet foods.  To prevent constipation: ? Drink enough fluid to keep your urine clear or pale yellow. ? Eat foods that are high in fiber, such as fresh fruits and vegetables, whole grains, and beans. Activity  Exercise only as directed by your health care provider. Most women can continue their usual exercise routine during pregnancy. Try to exercise for 30 minutes at least 5 days a week. Stop exercising if you experience uterine contractions.  Avoid heavy   lifting.  Do not exercise in extreme heat or humidity, or at high altitudes.  Wear low-heel, comfortable shoes.  Practice good posture.  You may continue to have sex unless your health care provider tells you otherwise. Relieving pain and discomfort  Take frequent breaks and rest with your legs elevated if you have leg cramps or low back pain.  Take warm sitz baths to soothe any pain or discomfort caused by hemorrhoids. Use hemorrhoid cream if your health care provider approves.  Wear a good support bra to prevent discomfort from breast tenderness.  If you develop varicose veins: ? Wear support pantyhose or compression stockings as told by your healthcare provider. ? Elevate your feet for 15 minutes, 3-4 times a day. Prenatal care  Write down your questions. Take them to your prenatal visits.  Keep all your prenatal visits as told by your health care provider. This is important. Safety  Wear your seat belt at  all times when driving.  Make a list of emergency phone numbers, including numbers for family, friends, the hospital, and police and fire departments. General instructions  Avoid cat litter boxes and soil used by cats. These carry germs that can cause birth defects in the baby. If you have a cat, ask someone to clean the litter box for you.  Do not travel far distances unless it is absolutely necessary and only with the approval of your health care provider.  Do not use hot tubs, steam rooms, or saunas.  Do not drink alcohol.  Do not use any products that contain nicotine or tobacco, such as cigarettes and e-cigarettes. If you need help quitting, ask your health care provider.  Do not use any medicinal herbs or unprescribed drugs. These chemicals affect the formation and growth of the baby.  Do not douche or use tampons or scented sanitary pads.  Do not cross your legs for long periods of time.  To prepare for the arrival of your baby: ? Take prenatal classes to understand, practice, and ask questions about labor and delivery. ? Make a trial run to the hospital. ? Visit the hospital and tour the maternity area. ? Arrange for maternity or paternity leave through employers. ? Arrange for family and friends to take care of pets while you are in the hospital. ? Purchase a rear-facing car seat and make sure you know how to install it in your car. ? Pack your hospital bag. ? Prepare the baby's nursery. Make sure to remove all pillows and stuffed animals from the baby's crib to prevent suffocation.  Visit your dentist if you have not gone during your pregnancy. Use a soft toothbrush to brush your teeth and be gentle when you floss. Contact a health care provider if:  You are unsure if you are in labor or if your water has broken.  You become dizzy.  You have mild pelvic cramps, pelvic pressure, or nagging pain in your abdominal area.  You have lower back pain.  You have persistent  nausea, vomiting, or diarrhea.  You have an unusual or bad smelling vaginal discharge.  You have pain when you urinate. Get help right away if:  Your water breaks before 37 weeks.  You have regular contractions less than 5 minutes apart before 37 weeks.  You have a fever.  You are leaking fluid from your vagina.  You have spotting or bleeding from your vagina.  You have severe abdominal pain or cramping.  You have rapid weight loss or weight gain.    You have shortness of breath with chest pain.  You notice sudden or extreme swelling of your face, hands, ankles, feet, or legs.  Your baby makes fewer than 10 movements in 2 hours.  You have severe headaches that do not go away when you take medicine.  You have vision changes. Summary  The third trimester is from week 28 through week 40, months 7 through 9. The third trimester is a time when the unborn baby (fetus) is growing rapidly.  During the third trimester, your discomfort may increase as you and your baby continue to gain weight. You may have abdominal, leg, and back pain, sleeping problems, and an increased need to urinate.  During the third trimester your breasts will keep growing and they will continue to become tender. A yellow fluid (colostrum) may leak from your breasts. This is the first milk you are producing for your baby.  False labor is a condition in which you feel small, irregular tightenings of the muscles in the womb (contractions) that eventually go away. These are called Braxton Hicks contractions. Contractions may last for hours, days, or even weeks before true labor sets in.  Signs of labor can include: abdominal cramps; regular contractions that start at 10 minutes apart and become stronger and more frequent with time; watery or bloody mucus discharge that comes from the vagina; increased pelvic pressure and dull back pain; and leaking of amniotic fluid. This information is not intended to replace advice  given to you by your health care provider. Make sure you discuss any questions you have with your health care provider. Document Released: 03/06/2001 Document Revised: 08/18/2015 Document Reviewed: 05/13/2012 Elsevier Interactive Patient Education  2017 [employer]  

## 2017-08-24 LAB — CBC
HEMOGLOBIN: 10.5 g/dL — AB (ref 11.1–15.9)
Hematocrit: 32.3 % — ABNORMAL LOW (ref 34.0–46.6)
MCH: 28.8 pg (ref 26.6–33.0)
MCHC: 32.5 g/dL (ref 31.5–35.7)
MCV: 89 fL (ref 79–97)
PLATELETS: 319 10*3/uL (ref 150–450)
RBC: 3.64 x10E6/uL — AB (ref 3.77–5.28)
RDW: 14.9 % (ref 12.3–15.4)
WBC: 13.6 10*3/uL — ABNORMAL HIGH (ref 3.4–10.8)

## 2017-08-24 LAB — GLUCOSE TOLERANCE, 2 HOURS W/ 1HR
GLUCOSE, FASTING: 73 mg/dL (ref 65–91)
Glucose, 1 hour: 109 mg/dL (ref 65–179)
Glucose, 2 hour: 111 mg/dL (ref 65–152)

## 2017-08-24 LAB — HIV ANTIBODY (ROUTINE TESTING W REFLEX): HIV Screen 4th Generation wRfx: NONREACTIVE

## 2017-08-24 LAB — RPR: RPR Ser Ql: NONREACTIVE

## 2017-08-28 ENCOUNTER — Telehealth: Payer: Self-pay | Admitting: *Deleted

## 2017-08-28 DIAGNOSIS — O99513 Diseases of the respiratory system complicating pregnancy, third trimester: Principal | ICD-10-CM

## 2017-08-28 DIAGNOSIS — J45909 Unspecified asthma, uncomplicated: Secondary | ICD-10-CM

## 2017-08-28 NOTE — Telephone Encounter (Signed)
Received a call from [facility] that her QVAR requires prior auth from [facility]. Can call medicaid at [phone] to get prior auth

## 2017-08-29 ENCOUNTER — Encounter: Payer: Self-pay | Admitting: *Deleted

## 2017-08-30 ENCOUNTER — Ambulatory Visit (INDEPENDENT_AMBULATORY_CARE_PROVIDER_SITE_OTHER): Payer: Medicaid Other

## 2017-08-30 ENCOUNTER — Encounter: Payer: Self-pay | Admitting: Obstetrics and Gynecology

## 2017-08-30 ENCOUNTER — Telehealth: Payer: Self-pay | Admitting: Family Medicine

## 2017-08-30 VITALS — BP 113/69 | HR 77

## 2017-08-30 DIAGNOSIS — O09892 Supervision of other high risk pregnancies, second trimester: Secondary | ICD-10-CM

## 2017-08-30 DIAGNOSIS — O09212 Supervision of pregnancy with history of pre-term labor, second trimester: Secondary | ICD-10-CM

## 2017-08-30 DIAGNOSIS — O99019 Anemia complicating pregnancy, unspecified trimester: Secondary | ICD-10-CM | POA: Insufficient documentation

## 2017-08-30 MED ORDER — TERCONAZOLE 80 MG VA SUPP: 80.0000 mg | Freq: Every day | VAGINAL | 0 refills | Status: DC

## 2017-08-30 MED ORDER — PHENYLEPH-SHARK LIV OIL-MO-PET 0.25-3-14-71.9 % RE OINT: 1.0000 "application " | TOPICAL_OINTMENT | Freq: Two times a day (BID) | RECTAL | 0 refills | Status: DC | PRN

## 2017-08-30 NOTE — Telephone Encounter (Signed)
[name] Self [phone]  Juel called to say that the yeast infection medicine is Triconazole Supp. 80 mg comes in a pack of 3. Hemorid cream not working *[employer] Brand 14% mineral Oil, 74.9% petroleum, 0.25% phenylethrine HCI. Inhaler-steroid Ovur needs prior authroization

## 2017-08-30 NOTE — Telephone Encounter (Signed)
Notified pt that the suppositories she requested and a new cream for her hemorrhoids has been sent to [facility] on [address].  Pt stated thank you with no further questions.

## 2017-08-30 NOTE — Progress Notes (Signed)
Pt here today for 17p injection.  Pt tolerated well.  Pt reports baby moving well.  Pt denies any abnormal sx.

## 2017-09-03 MED ORDER — BUDESONIDE 0.5 MG/2ML IN SUSP: 0.5000 mg | Freq: Two times a day (BID) | RESPIRATORY_TRACT | 1 refills | Status: DC

## 2017-09-03 NOTE — Telephone Encounter (Signed)
Called patient & informed her of alternative inhaler sent to pharmacy. Patient verbalized understanding and states the medication sent in for hemorrhoids isn't covered by insurance and it's too expensive out of pocket. Recommended OTC products as well as a stool softener. Patient verbalized understanding to all & had no questions.

## 2017-09-05 NOTE — Progress Notes (Signed)
I was present at the time of this nurse visit.  Agree with nursing documentation.   Julie P. Degele, MD OB Fellow   

## 2017-09-06 ENCOUNTER — Ambulatory Visit: Payer: Medicaid Other

## 2017-09-09 ENCOUNTER — Ambulatory Visit (INDEPENDENT_AMBULATORY_CARE_PROVIDER_SITE_OTHER): Payer: Medicaid Other | Admitting: Obstetrics & Gynecology

## 2017-09-09 VITALS — BP 106/62 | HR 82 | Wt 222.4 lb

## 2017-09-09 DIAGNOSIS — O09212 Supervision of pregnancy with history of pre-term labor, second trimester: Secondary | ICD-10-CM | POA: Diagnosis not present

## 2017-09-09 DIAGNOSIS — O99332 Smoking (tobacco) complicating pregnancy, second trimester: Secondary | ICD-10-CM | POA: Diagnosis not present

## 2017-09-09 DIAGNOSIS — O0992 Supervision of high risk pregnancy, unspecified, second trimester: Secondary | ICD-10-CM | POA: Diagnosis not present

## 2017-09-09 DIAGNOSIS — J4540 Moderate persistent asthma, uncomplicated: Secondary | ICD-10-CM | POA: Diagnosis not present

## 2017-09-09 MED ORDER — CYCLOBENZAPRINE HCL 5 MG PO TABS: 5.0000 mg | ORAL_TABLET | Freq: Three times a day (TID) | ORAL | 0 refills | Status: DC | PRN

## 2017-09-09 MED ORDER — RANITIDINE HCL 150 MG PO TABS: 300.0000 mg | ORAL_TABLET | Freq: Two times a day (BID) | ORAL | 2 refills | Status: DC

## 2017-09-09 MED ORDER — CETIRIZINE HCL 10 MG PO TABS: 10.0000 mg | ORAL_TABLET | Freq: Every day | ORAL | Status: DC

## 2017-09-09 MED ORDER — FLUTICASONE PROPIONATE HFA 110 MCG/ACT IN AERO: 1.0000 | INHALATION_SPRAY | Freq: Two times a day (BID) | RESPIRATORY_TRACT | Status: DC

## 2017-09-09 NOTE — Patient Instructions (Signed)

## 2017-09-09 NOTE — Progress Notes (Signed)
   PRENATAL VISIT NOTE  Subjective:  [name] is a 32 y.o. [phone] at [redacted]w[redacted]d being seen today for ongoing prenatal care.  She is currently monitored for the following issues for this high-risk pregnancy and has Migraine; Asthma; Chronic pain syndrome; Neck pain; Back pain; Osteoarthritis; Herniated nucleus pulposus, L4-5 left; Supervision of high risk pregnancy, antepartum, second trimester; History of preterm delivery, currently pregnant in second trimester; Tobacco use in pregnancy, antepartum, second trimester; Drug use affecting pregnancy in second trimester; Obesity affecting pregnancy, antepartum; Unwanted fertility; and Anemia in pregnancy on their problem list.  Patient reports heartburn and leg pain and cramps.  Contractions: Irregular. Vag. Bleeding: None.  Movement: Present. Denies leaking of fluid.   The following portions of the patient's history were reviewed and updated as appropriate: allergies, current medications, past family history, past medical history, past social history, past surgical history and problem list. Problem list updated.  Objective:   Vitals:   09/09/17 1636  BP: 106/62  Pulse: 82  Weight: 222 lb 6.4 oz (100.9 kg)    Fetal Status: Fetal Heart Rate (bpm): 146   Movement: Present     General:  Alert, oriented and cooperative. Patient is in no acute distress.  Skin: Skin is warm and dry. No rash noted.   Cardiovascular: Normal heart rate noted  Respiratory: Normal respiratory effort, no problems with respiration noted  Abdomen: Soft, gravid, appropriate for gestational age.  Pain/Pressure: Present     Pelvic: Cervical exam deferred        Extremities: Normal range of motion.  Edema: Mild pitting, slight indentation  Mental Status: Normal mood and affect. Normal behavior. Normal judgment and thought content.   Assessment and Plan:  Pregnancy: [id number] at [redacted]w[redacted]d  1. Supervision of high risk pregnancy, antepartum, second trimester  -  fluticasone (FLOVENT HFA) 110 MCG/ACT inhaler 1 puff - cetirizine (ZYRTEC) tablet 10 mg - ranitidine (ZANTAC) 150 MG tablet; Take 2 tablets (300 mg total) by mouth 2 (two) times daily.  Dispense: 120 tablet; Refill: 2 - cyclobenzaprine (FLEXERIL) 5 MG tablet; Take 1 tablet (5 mg total) by mouth every 8 (eight) hours as needed for muscle spasms.  Dispense: 30 tablet; Refill: 0  2. Tobacco use in pregnancy, antepartum, second trimester  - fluticasone (FLOVENT HFA) 110 MCG/ACT inhaler 1 puff - cetirizine (ZYRTEC) tablet 10 mg - ranitidine (ZANTAC) 150 MG tablet; Take 2 tablets (300 mg total) by mouth 2 (two) times daily.  Dispense: 120 tablet; Refill: 2  3. Moderate persistent asthma, unspecified whether complicated Flovent  Preterm labor symptoms and general obstetric precautions including but not limited to vaginal bleeding, contractions, leaking of fluid and fetal movement were reviewed in detail with the patient. Please refer to After Visit Summary for other counseling recommendations.  Return in about 2 weeks (around 09/23/2017) for 17 every week.  No future appointments.  Emeterio Reeve, MD

## 2017-09-10 ENCOUNTER — Telehealth: Payer: Self-pay

## 2017-09-10 ENCOUNTER — Other Ambulatory Visit: Payer: Self-pay

## 2017-09-10 DIAGNOSIS — J4540 Moderate persistent asthma, uncomplicated: Secondary | ICD-10-CM

## 2017-09-10 DIAGNOSIS — J45909 Unspecified asthma, uncomplicated: Secondary | ICD-10-CM

## 2017-09-10 MED ORDER — FLUTICASONE PROPIONATE HFA 110 MCG/ACT IN AERO: 1.0000 | INHALATION_SPRAY | Freq: Two times a day (BID) | RESPIRATORY_TRACT | 12 refills | Status: DC

## 2017-09-10 MED ORDER — CETIRIZINE HCL 10 MG PO CHEW: 10.0000 mg | CHEWABLE_TABLET | Freq: Every day | ORAL | 90 refills | Status: DC

## 2017-09-10 NOTE — Telephone Encounter (Signed)
Pt called orig stating pharmacy does not have the meds that the provider was suppose to send on 09/09/17. Re-ordered Flo vent Zyrtec, Zantac.Called Pharmacy & he stated received request, Called pt back to advise. Pt verbalized understanding.

## 2017-09-16 ENCOUNTER — Ambulatory Visit (INDEPENDENT_AMBULATORY_CARE_PROVIDER_SITE_OTHER): Payer: Medicaid Other | Admitting: General Practice

## 2017-09-16 VITALS — BP 114/63 | HR 93 | Ht 63.0 in | Wt 223.0 lb

## 2017-09-16 DIAGNOSIS — O09212 Supervision of pregnancy with history of pre-term labor, second trimester: Secondary | ICD-10-CM | POA: Diagnosis present

## 2017-09-16 DIAGNOSIS — O09892 Supervision of other high risk pregnancies, second trimester: Secondary | ICD-10-CM

## 2017-09-16 DIAGNOSIS — O0992 Supervision of high risk pregnancy, unspecified, second trimester: Secondary | ICD-10-CM

## 2017-09-16 NOTE — Progress Notes (Signed)
Patient seen and assessed by nursing staff.  Agree with documentation and plan.  

## 2017-09-16 NOTE — Progress Notes (Signed)
[name] here for 17-P  Injection.  Injection administered without complication. Patient will return in one week for next injection.  Derinda Late, RN 09/16/2017  10:18 AM

## 2017-09-27 ENCOUNTER — Ambulatory Visit (INDEPENDENT_AMBULATORY_CARE_PROVIDER_SITE_OTHER): Payer: Medicaid Other | Admitting: Obstetrics & Gynecology

## 2017-09-27 VITALS — BP 95/50 | HR 89 | Wt 223.0 lb

## 2017-09-27 DIAGNOSIS — O09213 Supervision of pregnancy with history of pre-term labor, third trimester: Secondary | ICD-10-CM

## 2017-09-27 DIAGNOSIS — O0993 Supervision of high risk pregnancy, unspecified, third trimester: Secondary | ICD-10-CM | POA: Diagnosis present

## 2017-09-27 DIAGNOSIS — F439 Reaction to severe stress, unspecified: Secondary | ICD-10-CM

## 2017-09-27 DIAGNOSIS — O99213 Obesity complicating pregnancy, third trimester: Secondary | ICD-10-CM

## 2017-09-27 DIAGNOSIS — E669 Obesity, unspecified: Secondary | ICD-10-CM | POA: Diagnosis not present

## 2017-09-27 DIAGNOSIS — O99210 Obesity complicating pregnancy, unspecified trimester: Secondary | ICD-10-CM

## 2017-09-27 DIAGNOSIS — O0992 Supervision of high risk pregnancy, unspecified, second trimester: Secondary | ICD-10-CM

## 2017-09-27 NOTE — Progress Notes (Signed)
Refill on makena ordered

## 2017-09-27 NOTE — Progress Notes (Signed)
   PRENATAL VISIT NOTE  Subjective:  [name] is a 31 y.o. [phone] at [redacted]w[redacted]d being seen today for ongoing prenatal care.  She is currently monitored for the following issues for this high-risk pregnancy and has Migraine; Asthma; Chronic pain syndrome; Neck pain; Back pain; Osteoarthritis; Herniated nucleus pulposus, L4-5 left; Supervision of high risk pregnancy, antepartum, second trimester; History of preterm delivery, currently pregnant in second trimester; Tobacco use in pregnancy, antepartum, second trimester; Drug use affecting pregnancy in second trimester; Obesity affecting pregnancy, antepartum; Unwanted fertility; and Anemia in pregnancy on their problem list.  Patient reports no complaints.  Contractions: Irritability.  .  Movement: Present. Denies leaking of fluid.   The following portions of the patient's history were reviewed and updated as appropriate: allergies, current medications, past family history, past medical history, past social history, past surgical history and problem list. Problem list updated.  Objective:   Vitals:   09/27/17 1127  BP: (!) 95/50  Pulse: 89  Weight: 223 lb (101.2 kg)    Fetal Status: Fetal Heart Rate (bpm): 143   Movement: Present     General:  Alert, oriented and cooperative. Patient is in no acute distress.  Skin: Skin is warm and dry. No rash noted.   Cardiovascular: Normal heart rate noted  Respiratory: Normal respiratory effort, no problems with respiration noted  Abdomen: Soft, gravid, appropriate for gestational age.  Pain/Pressure: Present     Pelvic: Cervical exam performed        Extremities: Normal range of motion.  Edema: Mild pitting, slight indentation  Mental Status: Normal mood and affect. Normal behavior. Normal judgment and thought content.   Assessment and Plan:  Pregnancy: [id number] at [redacted]w[redacted]d  1. Supervision of high risk pregnancy, antepartum, second trimester   2. Obesity affecting pregnancy,  antepartum   Preterm labor symptoms and general obstetric precautions including but not limited to vaginal bleeding, contractions, leaking of fluid and fetal movement were reviewed in detail with the patient. Please refer to After Visit Summary for other counseling recommendations.  Return in about 2 weeks (around 10/11/2017).  No future appointments.  Emily Filbert, MD

## 2017-10-04 ENCOUNTER — Ambulatory Visit (INDEPENDENT_AMBULATORY_CARE_PROVIDER_SITE_OTHER): Payer: Medicaid Other | Admitting: General Practice

## 2017-10-04 VITALS — BP 113/66 | HR 85 | Ht 63.0 in | Wt 222.0 lb

## 2017-10-04 DIAGNOSIS — O0992 Supervision of high risk pregnancy, unspecified, second trimester: Secondary | ICD-10-CM

## 2017-10-04 DIAGNOSIS — R3915 Urgency of urination: Secondary | ICD-10-CM

## 2017-10-04 DIAGNOSIS — O09212 Supervision of pregnancy with history of pre-term labor, second trimester: Secondary | ICD-10-CM

## 2017-10-04 DIAGNOSIS — O09892 Supervision of other high risk pregnancies, second trimester: Secondary | ICD-10-CM

## 2017-10-04 LAB — POCT URINALYSIS DIP (DEVICE)
Bilirubin Urine: NEGATIVE
GLUCOSE, UA: NEGATIVE mg/dL
Hgb urine dipstick: NEGATIVE
KETONES UR: NEGATIVE mg/dL
Leukocytes, UA: NEGATIVE
Nitrite: NEGATIVE
Protein, ur: NEGATIVE mg/dL
SPECIFIC GRAVITY, URINE: 1.015 (ref 1.005–1.030)
Urobilinogen, UA: 0.2 mg/dL (ref 0.0–1.0)
pH: 7 (ref 5.0–8.0)

## 2017-10-04 MED ORDER — CYCLOBENZAPRINE HCL 5 MG PO TABS: 5.0000 mg | ORAL_TABLET | Freq: Three times a day (TID) | ORAL | 0 refills | Status: DC | PRN

## 2017-10-04 NOTE — Progress Notes (Signed)
Patient seen and assessed by nursing staff.  Agree with documentation and plan.  

## 2017-10-04 NOTE — Progress Notes (Signed)
Patient presents to office today for 17p injection. 17p injection given without complication. Patient reports increased vaginal pain & pressure & increased urgency with urination. Patient reports significant amount of pain especially neck into arms causing numbness. UA normal. Flexeril refilled per Dr Kennon Rounds. Patient will follow up next week.

## 2017-10-11 ENCOUNTER — Encounter: Payer: Medicaid Other | Admitting: Obstetrics & Gynecology

## 2017-10-15 ENCOUNTER — Encounter (HOSPITAL_COMMUNITY): Payer: Self-pay

## 2017-10-15 ENCOUNTER — Ambulatory Visit (HOSPITAL_COMMUNITY)
Admission: RE | Admit: 2017-10-15 | Discharge: 2017-10-15 | Disposition: A | Discharge status: Home / Self Care | Payer: Medicaid Other | Source: Ambulatory Visit | Attending: Obstetrics & Gynecology | Admitting: Obstetrics & Gynecology

## 2017-10-15 ENCOUNTER — Other Ambulatory Visit: Payer: Self-pay | Admitting: Obstetrics & Gynecology

## 2017-10-15 DIAGNOSIS — Z3A35 35 weeks gestation of pregnancy: Secondary | ICD-10-CM | POA: Diagnosis not present

## 2017-10-15 DIAGNOSIS — O99210 Obesity complicating pregnancy, unspecified trimester: Secondary | ICD-10-CM

## 2017-10-15 DIAGNOSIS — O0992 Supervision of high risk pregnancy, unspecified, second trimester: Secondary | ICD-10-CM

## 2017-10-15 DIAGNOSIS — O99213 Obesity complicating pregnancy, third trimester: Secondary | ICD-10-CM | POA: Insufficient documentation

## 2017-10-15 DIAGNOSIS — O99333 Smoking (tobacco) complicating pregnancy, third trimester: Secondary | ICD-10-CM | POA: Diagnosis not present

## 2017-10-15 DIAGNOSIS — O09213 Supervision of pregnancy with history of pre-term labor, third trimester: Secondary | ICD-10-CM | POA: Diagnosis not present

## 2017-10-18 ENCOUNTER — Ambulatory Visit (INDEPENDENT_AMBULATORY_CARE_PROVIDER_SITE_OTHER): Payer: Medicaid Other

## 2017-10-18 DIAGNOSIS — O09212 Supervision of pregnancy with history of pre-term labor, second trimester: Secondary | ICD-10-CM | POA: Diagnosis not present

## 2017-10-18 DIAGNOSIS — L539 Erythematous condition, unspecified: Secondary | ICD-10-CM

## 2017-10-18 NOTE — Progress Notes (Signed)
[name] here for 17-P  Injection.  Injection administered without complication. Patient will return in one week for next injection.  Bethanne Ginger, CMA 10/18/2017  11:50 AM

## 2017-10-21 NOTE — Progress Notes (Signed)
I have reviewed this chart and agree with the RN/CMA assessment and management.    Talen Poser C Dennison Mcdaid, MD, FACOG Attending Physician, [facility]  

## 2017-10-25 ENCOUNTER — Ambulatory Visit (INDEPENDENT_AMBULATORY_CARE_PROVIDER_SITE_OTHER): Payer: Medicaid Other | Admitting: Obstetrics and Gynecology

## 2017-10-25 ENCOUNTER — Encounter: Payer: Self-pay | Admitting: Obstetrics and Gynecology

## 2017-10-25 ENCOUNTER — Other Ambulatory Visit (HOSPITAL_COMMUNITY)
Admission: RE | Admit: 2017-10-25 | Discharge: 2017-10-25 | Disposition: A | Discharge status: Home / Self Care | Payer: Medicaid Other | Source: Ambulatory Visit | Attending: Obstetrics and Gynecology | Admitting: Obstetrics and Gynecology

## 2017-10-25 VITALS — BP 114/69 | HR 93 | Wt 223.0 lb

## 2017-10-25 DIAGNOSIS — Z3009 Encounter for other general counseling and advice on contraception: Secondary | ICD-10-CM

## 2017-10-25 DIAGNOSIS — O0993 Supervision of high risk pregnancy, unspecified, third trimester: Secondary | ICD-10-CM | POA: Diagnosis not present

## 2017-10-25 DIAGNOSIS — O09892 Supervision of other high risk pregnancies, second trimester: Secondary | ICD-10-CM

## 2017-10-25 DIAGNOSIS — Z3A36 36 weeks gestation of pregnancy: Secondary | ICD-10-CM | POA: Diagnosis not present

## 2017-10-25 DIAGNOSIS — O09212 Supervision of pregnancy with history of pre-term labor, second trimester: Secondary | ICD-10-CM | POA: Diagnosis not present

## 2017-10-25 DIAGNOSIS — O0992 Supervision of high risk pregnancy, unspecified, second trimester: Secondary | ICD-10-CM | POA: Diagnosis present

## 2017-10-25 MED ORDER — HYDROXYPROGESTERONE CAPROATE 275 MG/1.1ML ~~LOC~~ SOAJ: 275.0000 mg | Freq: Once | SUBCUTANEOUS | Status: DC

## 2017-10-25 MED ORDER — HYDROXYPROGESTERONE CAPROATE 250 MG/ML IM OIL
250.0000 mg | TOPICAL_OIL | Freq: Once | INTRAMUSCULAR | Status: AC
  Administered 2017-10-25: 250 mg via INTRAMUSCULAR

## 2017-10-25 NOTE — Addendum Note (Signed)
Addended by: Bethanne Ginger on: 10/25/2017 10:01 AM   Modules accepted: Orders

## 2017-10-25 NOTE — Patient Instructions (Signed)
Vaginal Delivery Vaginal delivery means that you will give birth by pushing your baby out of your birth canal (vagina). A team of health care providers will help you before, during, and after vaginal delivery. Birth experiences are unique for every woman and every pregnancy, and birth experiences vary depending on where you choose to give birth. What should I do to prepare for my baby's birth? Before your baby is born, it is important to talk with your health care provider about:  Your labor and delivery preferences. These may include: ? Medicines that you may be given. ? How you will manage your pain. This might include non-medical pain relief techniques or injectable pain relief such as epidural analgesia. ? How you and your baby will be monitored during labor and delivery. ? Who may be in the labor and delivery room with you. ? Your feelings about surgical delivery of your baby (cesarean delivery, or C-section) if this becomes necessary. ? Your feelings about receiving donated blood through an IV tube (blood transfusion) if this becomes necessary.  Whether you are able: ? To take pictures or videos of the birth. ? To eat during labor and delivery. ? To move around, walk, or change positions during labor and delivery.  What to expect after your baby is born, such as: ? Whether delayed umbilical cord clamping and cutting is offered. ? Who will care for your baby right after birth. ? Medicines or tests that may be recommended for your baby. ? Whether breastfeeding is supported in your hospital or birth center. ? How long you will be in the hospital or birth center.  How any medical conditions you have may affect your baby or your labor and delivery experience.  To prepare for your baby's birth, you should also:  Attend all of your health care visits before delivery (prenatal visits) as recommended by your health care provider. This is important.  Prepare your home for your baby's  arrival. Make sure that you have: ? Diapers. ? Baby clothing. ? Feeding equipment. ? Safe sleeping arrangements for you and your baby.  Install a car seat in your vehicle. Have your car seat checked by a certified car seat installer to make sure that it is installed safely.  Think about who will help you with your new baby at home for at least the first several weeks after delivery.  What can I expect when I arrive at the birth center or hospital? Once you are in labor and have been admitted into the hospital or birth center, your health care provider may:  Review your pregnancy history and any concerns you have.  Insert an IV tube into one of your veins. This is used to give you fluids and medicines.  Check your blood pressure, pulse, temperature, and heart rate (vital signs).  Check whether your bag of water (amniotic sac) has broken (ruptured).  Talk with you about your birth plan and discuss pain control options.  Monitoring Your health care provider may monitor your contractions (uterine monitoring) and your baby's heart rate (fetal monitoring). You may need to be monitored:  Often, but not continuously (intermittently).  All the time or for long periods at a time (continuously). Continuous monitoring may be needed if: ? You are taking certain medicines, such as medicine to relieve pain or make your contractions stronger. ? You have pregnancy or labor complications.  Monitoring may be done by:  Placing a special stethoscope or a handheld monitoring device on your abdomen to   check your baby's heartbeat, and feeling your abdomen for contractions. This method of monitoring does not continuously record your baby's heartbeat or your contractions.  Placing monitors on your abdomen (external monitors) to record your baby's heartbeat and the frequency and length of contractions. You may not have to wear external monitors all the time.  Placing monitors inside of your uterus  (internal monitors) to record your baby's heartbeat and the frequency, length, and strength of your contractions. ? Your health care provider may use internal monitors if he or she needs more information about the strength of your contractions or your baby's heart rate. ? Internal monitors are put in place by passing a thin, flexible wire through your vagina and into your uterus. Depending on the type of monitor, it may remain in your uterus or on your baby's head until birth. ? Your health care provider will discuss the benefits and risks of internal monitoring with you and will ask for your permission before inserting the monitors.  Telemetry. This is a type of continuous monitoring that can be done with external or internal monitors. Instead of having to stay in bed, you are able to move around during telemetry. Ask your health care provider if telemetry is an option for you.  Physical exam Your health care provider may perform a physical exam. This may include:  Checking whether your baby is positioned: ? With the head toward your vagina (head-down). This is most common. ? With the head toward the top of your uterus (head-up or breech). If your baby is in a breech position, your health care provider may try to turn your baby to a head-down position so you can deliver vaginally. If it does not seem that your baby can be born vaginally, your provider may recommend surgery to deliver your baby. In rare cases, you may be able to deliver vaginally if your baby is head-up (breech delivery). ? Lying sideways (transverse). Babies that are lying sideways cannot be delivered vaginally.  Checking your cervix to determine: ? Whether it is thinning out (effacing). ? Whether it is opening up (dilating). ? How low your baby has moved into your birth canal.  What are the three stages of labor and delivery?  Normal labor and delivery is divided into the following three stages: Stage 1  Stage 1 is the  longest stage of labor, and it can last for hours or days. Stage 1 includes: ? Early labor. This is when contractions may be irregular, or regular and mild. Generally, early labor contractions are more than 10 minutes apart. ? Active labor. This is when contractions get longer, more regular, more frequent, and more intense. ? The transition phase. This is when contractions happen very close together, are very intense, and may last longer than during any other part of labor.  Contractions generally feel mild, infrequent, and irregular at first. They get stronger, more frequent (about every 2-3 minutes), and more regular as you progress from early labor through active labor and transition.  Many women progress through stage 1 naturally, but you may need help to continue making progress. If this happens, your health care provider may talk with you about: ? Rupturing your amniotic sac if it has not ruptured yet. ? Giving you medicine to help make your contractions stronger and more frequent.  Stage 1 ends when your cervix is completely dilated to 4 inches (10 cm) and completely effaced. This happens at the end of the transition phase. Stage 2  Once   your cervix is completely effaced and dilated to 4 inches (10 cm), you may start to feel an urge to push. It is common for the body to naturally take a rest before feeling the urge to push, especially if you received an epidural or certain other pain medicines. This rest period may last for up to 1-2 hours, depending on your unique labor experience.  During stage 2, contractions are generally less painful, because pushing helps relieve contraction pain. Instead of contraction pain, you may feel stretching and burning pain, especially when the widest part of your baby's head passes through the vaginal opening (crowning).  Your health care provider will closely monitor your pushing progress and your baby's progress through the vagina during stage 2.  Your  health care provider may massage the area of skin between your vaginal opening and anus (perineum) or apply warm compresses to your perineum. This helps it stretch as the baby's head starts to crown, which can help prevent perineal tearing. ? In some cases, an incision may be made in your perineum (episiotomy) to allow the baby to pass through the vaginal opening. An episiotomy helps to make the opening of the vagina larger to allow more room for the baby to fit through.  It is very important to breathe and focus so your health care provider can control the delivery of your baby's head. Your health care provider may have you decrease the intensity of your pushing, to help prevent perineal tearing.  After delivery of your baby's head, the shoulders and the rest of the body generally deliver very quickly and without difficulty.  Once your baby is delivered, the umbilical cord may be cut right away, or this may be delayed for 1-2 minutes, depending on your baby's health. This may vary among health care providers, hospitals, and birth centers.  If you and your baby are healthy enough, your baby may be placed on your chest or abdomen to help maintain the baby's temperature and to help you bond with each other. Some mothers and babies start breastfeeding at this time. Your health care team will dry your baby and help keep your baby warm during this time.  Your baby may need immediate care if he or she: ? Showed signs of distress during labor. ? Has a medical condition. ? Was born too early (prematurely). ? Had a bowel movement before birth (meconium). ? Shows signs of difficulty transitioning from being inside the uterus to being outside of the uterus. If you are planning to breastfeed, your health care team will help you begin a feeding. Stage 3  The third stage of labor starts immediately after the birth of your baby and ends after you deliver the placenta. The placenta is an organ that develops  during pregnancy to provide oxygen and nutrients to your baby in the womb.  Delivering the placenta may require some pushing, and you may have mild contractions. Breastfeeding can stimulate contractions to help you deliver the placenta.  After the placenta is delivered, your uterus should tighten (contract) and become firm. This helps to stop bleeding in your uterus. To help your uterus contract and to control bleeding, your health care provider may: ? Give you medicine by injection, through an IV tube, by mouth, or through your rectum (rectally). ? Massage your abdomen or perform a vaginal exam to remove any blood clots that are left in your uterus. ? Empty your bladder by placing a thin, flexible tube (catheter) into your bladder. ? Encourage   you to breastfeed your baby. After labor is over, you and your baby will be monitored closely to ensure that you are both healthy until you are ready to go home. Your health care team will teach you how to care for yourself and your baby. This information is not intended to replace advice given to you by your health care provider. Make sure you discuss any questions you have with your health care provider. Document Released: 12/20/2007 Document Revised: 09/30/2015 Document Reviewed: 03/27/2015 Elsevier Interactive Patient Education  2018 [employer]  

## 2017-10-25 NOTE — Progress Notes (Signed)
Subjective:  [name] is a 32 y.o. [phone] at [redacted]w[redacted]d being seen today for ongoing prenatal care.  She is currently monitored for the following issues for this high-risk pregnancy and has Migraine; Asthma; Chronic pain syndrome; Neck pain; Back pain; Osteoarthritis; Herniated nucleus pulposus, L4-5 left; Supervision of high risk pregnancy, antepartum, second trimester; History of preterm delivery, currently pregnant in second trimester; Tobacco use in pregnancy, antepartum, second trimester; Drug use affecting pregnancy in second trimester; Obesity affecting pregnancy, antepartum; Unwanted fertility; and Anemia in pregnancy on their problem list.  Patient reports general discomforts of pregnancy.  Contractions: Irritability. Vag. Bleeding: None.  Movement: Present. Denies leaking of fluid.   The following portions of the patient's history were reviewed and updated as appropriate: allergies, current medications, past family history, past medical history, past social history, past surgical history and problem list. Problem list updated.  Objective:   Vitals:   10/25/17 0843  BP: 114/69  Pulse: 93  Weight: 223 lb (101.2 kg)    Fetal Status: Fetal Heart Rate (bpm): 143   Movement: Present     General:  Alert, oriented and cooperative. Patient is in no acute distress.  Skin: Skin is warm and dry. No rash noted.   Cardiovascular: Normal heart rate noted  Respiratory: Normal respiratory effort, no problems with respiration noted  Abdomen: Soft, gravid, appropriate for gestational age. Pain/Pressure: Present     Pelvic:  Cervical exam performed        Extremities: Normal range of motion.  Edema: Mild pitting, slight indentation  Mental Status: Normal mood and affect. Normal behavior. Normal judgment and thought content.   Urinalysis:      Assessment and Plan:  Pregnancy: [id number] at [redacted]w[redacted]d  1. Supervision of high risk pregnancy, antepartum, second trimester Labor precautions -  GC/Chlamydia probe amp ([place])not at [facility] - Culture, beta strep (group b only) EFW 2260 gm on 10/15/17  2. History of preterm delivery, currently pregnant in second trimester Last 17 OHP injection today  3. Unwanted fertility BTL papers signed  Preterm labor symptoms and general obstetric precautions including but not limited to vaginal bleeding, contractions, leaking of fluid and fetal movement were reviewed in detail with the patient. Please refer to After Visit Summary for other counseling recommendations.  Return in about 1 week (around 11/01/2017) for OB visit.   Chancy Milroy, MD

## 2017-10-28 LAB — GC/CHLAMYDIA PROBE AMP (~~LOC~~) NOT AT ARMC
CHLAMYDIA, DNA PROBE: NEGATIVE
Neisseria Gonorrhea: NEGATIVE

## 2017-10-29 LAB — CULTURE, BETA STREP (GROUP B ONLY): Strep Gp B Culture: NEGATIVE

## 2017-11-01 ENCOUNTER — Encounter: Payer: Medicaid Other | Admitting: Family Medicine

## 2017-11-08 ENCOUNTER — Encounter: Payer: Self-pay | Admitting: Obstetrics and Gynecology

## 2017-11-08 ENCOUNTER — Other Ambulatory Visit: Payer: Self-pay | Admitting: Family Medicine

## 2017-11-08 ENCOUNTER — Ambulatory Visit (INDEPENDENT_AMBULATORY_CARE_PROVIDER_SITE_OTHER): Payer: Medicaid Other | Admitting: Obstetrics and Gynecology

## 2017-11-08 VITALS — BP 113/77 | HR 83 | Wt 222.6 lb

## 2017-11-08 DIAGNOSIS — O09213 Supervision of pregnancy with history of pre-term labor, third trimester: Secondary | ICD-10-CM

## 2017-11-08 DIAGNOSIS — O0992 Supervision of high risk pregnancy, unspecified, second trimester: Secondary | ICD-10-CM

## 2017-11-08 DIAGNOSIS — Z3009 Encounter for other general counseling and advice on contraception: Secondary | ICD-10-CM

## 2017-11-08 DIAGNOSIS — O0993 Supervision of high risk pregnancy, unspecified, third trimester: Secondary | ICD-10-CM

## 2017-11-08 DIAGNOSIS — O09892 Supervision of other high risk pregnancies, second trimester: Secondary | ICD-10-CM

## 2017-11-08 DIAGNOSIS — O09212 Supervision of pregnancy with history of pre-term labor, second trimester: Secondary | ICD-10-CM

## 2017-11-08 NOTE — Patient Instructions (Signed)
Vaginal Delivery Vaginal delivery means that you will give birth by pushing your baby out of your birth canal (vagina). A team of health care providers will help you before, during, and after vaginal delivery. Birth experiences are unique for every woman and every pregnancy, and birth experiences vary depending on where you choose to give birth. What should I do to prepare for my baby's birth? Before your baby is born, it is important to talk with your health care provider about:  Your labor and delivery preferences. These may include: ? Medicines that you may be given. ? How you will manage your pain. This might include non-medical pain relief techniques or injectable pain relief such as epidural analgesia. ? How you and your baby will be monitored during labor and delivery. ? Who may be in the labor and delivery room with you. ? Your feelings about surgical delivery of your baby (cesarean delivery, or C-section) if this becomes necessary. ? Your feelings about receiving donated blood through an IV tube (blood transfusion) if this becomes necessary.  Whether you are able: ? To take pictures or videos of the birth. ? To eat during labor and delivery. ? To move around, walk, or change positions during labor and delivery.  What to expect after your baby is born, such as: ? Whether delayed umbilical cord clamping and cutting is offered. ? Who will care for your baby right after birth. ? Medicines or tests that may be recommended for your baby. ? Whether breastfeeding is supported in your hospital or birth center. ? How long you will be in the hospital or birth center.  How any medical conditions you have may affect your baby or your labor and delivery experience.  To prepare for your baby's birth, you should also:  Attend all of your health care visits before delivery (prenatal visits) as recommended by your health care provider. This is important.  Prepare your home for your baby's  arrival. Make sure that you have: ? Diapers. ? Baby clothing. ? Feeding equipment. ? Safe sleeping arrangements for you and your baby.  Install a car seat in your vehicle. Have your car seat checked by a certified car seat installer to make sure that it is installed safely.  Think about who will help you with your new baby at home for at least the first several weeks after delivery.  What can I expect when I arrive at the birth center or hospital? Once you are in labor and have been admitted into the hospital or birth center, your health care provider may:  Review your pregnancy history and any concerns you have.  Insert an IV tube into one of your veins. This is used to give you fluids and medicines.  Check your blood pressure, pulse, temperature, and heart rate (vital signs).  Check whether your bag of water (amniotic sac) has broken (ruptured).  Talk with you about your birth plan and discuss pain control options.  Monitoring Your health care provider may monitor your contractions (uterine monitoring) and your baby's heart rate (fetal monitoring). You may need to be monitored:  Often, but not continuously (intermittently).  All the time or for long periods at a time (continuously). Continuous monitoring may be needed if: ? You are taking certain medicines, such as medicine to relieve pain or make your contractions stronger. ? You have pregnancy or labor complications.  Monitoring may be done by:  Placing a special stethoscope or a handheld monitoring device on your abdomen to   check your baby's heartbeat, and feeling your abdomen for contractions. This method of monitoring does not continuously record your baby's heartbeat or your contractions.  Placing monitors on your abdomen (external monitors) to record your baby's heartbeat and the frequency and length of contractions. You may not have to wear external monitors all the time.  Placing monitors inside of your uterus  (internal monitors) to record your baby's heartbeat and the frequency, length, and strength of your contractions. ? Your health care provider may use internal monitors if he or she needs more information about the strength of your contractions or your baby's heart rate. ? Internal monitors are put in place by passing a thin, flexible wire through your vagina and into your uterus. Depending on the type of monitor, it may remain in your uterus or on your baby's head until birth. ? Your health care provider will discuss the benefits and risks of internal monitoring with you and will ask for your permission before inserting the monitors.  Telemetry. This is a type of continuous monitoring that can be done with external or internal monitors. Instead of having to stay in bed, you are able to move around during telemetry. Ask your health care provider if telemetry is an option for you.  Physical exam Your health care provider may perform a physical exam. This may include:  Checking whether your baby is positioned: ? With the head toward your vagina (head-down). This is most common. ? With the head toward the top of your uterus (head-up or breech). If your baby is in a breech position, your health care provider may try to turn your baby to a head-down position so you can deliver vaginally. If it does not seem that your baby can be born vaginally, your provider may recommend surgery to deliver your baby. In rare cases, you may be able to deliver vaginally if your baby is head-up (breech delivery). ? Lying sideways (transverse). Babies that are lying sideways cannot be delivered vaginally.  Checking your cervix to determine: ? Whether it is thinning out (effacing). ? Whether it is opening up (dilating). ? How low your baby has moved into your birth canal.  What are the three stages of labor and delivery?  Normal labor and delivery is divided into the following three stages: Stage 1  Stage 1 is the  longest stage of labor, and it can last for hours or days. Stage 1 includes: ? Early labor. This is when contractions may be irregular, or regular and mild. Generally, early labor contractions are more than 10 minutes apart. ? Active labor. This is when contractions get longer, more regular, more frequent, and more intense. ? The transition phase. This is when contractions happen very close together, are very intense, and may last longer than during any other part of labor.  Contractions generally feel mild, infrequent, and irregular at first. They get stronger, more frequent (about every 2-3 minutes), and more regular as you progress from early labor through active labor and transition.  Many women progress through stage 1 naturally, but you may need help to continue making progress. If this happens, your health care provider may talk with you about: ? Rupturing your amniotic sac if it has not ruptured yet. ? Giving you medicine to help make your contractions stronger and more frequent.  Stage 1 ends when your cervix is completely dilated to 4 inches (10 cm) and completely effaced. This happens at the end of the transition phase. Stage 2  Once   your cervix is completely effaced and dilated to 4 inches (10 cm), you may start to feel an urge to push. It is common for the body to naturally take a rest before feeling the urge to push, especially if you received an epidural or certain other pain medicines. This rest period may last for up to 1-2 hours, depending on your unique labor experience.  During stage 2, contractions are generally less painful, because pushing helps relieve contraction pain. Instead of contraction pain, you may feel stretching and burning pain, especially when the widest part of your baby's head passes through the vaginal opening (crowning).  Your health care provider will closely monitor your pushing progress and your baby's progress through the vagina during stage 2.  Your  health care provider may massage the area of skin between your vaginal opening and anus (perineum) or apply warm compresses to your perineum. This helps it stretch as the baby's head starts to crown, which can help prevent perineal tearing. ? In some cases, an incision may be made in your perineum (episiotomy) to allow the baby to pass through the vaginal opening. An episiotomy helps to make the opening of the vagina larger to allow more room for the baby to fit through.  It is very important to breathe and focus so your health care provider can control the delivery of your baby's head. Your health care provider may have you decrease the intensity of your pushing, to help prevent perineal tearing.  After delivery of your baby's head, the shoulders and the rest of the body generally deliver very quickly and without difficulty.  Once your baby is delivered, the umbilical cord may be cut right away, or this may be delayed for 1-2 minutes, depending on your baby's health. This may vary among health care providers, hospitals, and birth centers.  If you and your baby are healthy enough, your baby may be placed on your chest or abdomen to help maintain the baby's temperature and to help you bond with each other. Some mothers and babies start breastfeeding at this time. Your health care team will dry your baby and help keep your baby warm during this time.  Your baby may need immediate care if he or she: ? Showed signs of distress during labor. ? Has a medical condition. ? Was born too early (prematurely). ? Had a bowel movement before birth (meconium). ? Shows signs of difficulty transitioning from being inside the uterus to being outside of the uterus. If you are planning to breastfeed, your health care team will help you begin a feeding. Stage 3  The third stage of labor starts immediately after the birth of your baby and ends after you deliver the placenta. The placenta is an organ that develops  during pregnancy to provide oxygen and nutrients to your baby in the womb.  Delivering the placenta may require some pushing, and you may have mild contractions. Breastfeeding can stimulate contractions to help you deliver the placenta.  After the placenta is delivered, your uterus should tighten (contract) and become firm. This helps to stop bleeding in your uterus. To help your uterus contract and to control bleeding, your health care provider may: ? Give you medicine by injection, through an IV tube, by mouth, or through your rectum (rectally). ? Massage your abdomen or perform a vaginal exam to remove any blood clots that are left in your uterus. ? Empty your bladder by placing a thin, flexible tube (catheter) into your bladder. ? Encourage   you to breastfeed your baby. After labor is over, you and your baby will be monitored closely to ensure that you are both healthy until you are ready to go home. Your health care team will teach you how to care for yourself and your baby. This information is not intended to replace advice given to you by your health care provider. Make sure you discuss any questions you have with your health care provider. Document Released: 12/20/2007 Document Revised: 09/30/2015 Document Reviewed: 03/27/2015 Elsevier Interactive Patient Education  2018 [employer]  

## 2017-11-08 NOTE — Progress Notes (Signed)
Subjective:  [name] is a 32 y.o. [phone] at [redacted]w[redacted]d being seen today for ongoing prenatal care.  She is currently monitored for the following issues for this high-risk pregnancy and has Migraine; Asthma; Chronic pain syndrome; Neck pain; Back pain; Osteoarthritis; Herniated nucleus pulposus, L4-5 left; Supervision of high risk pregnancy, antepartum, second trimester; History of preterm delivery, currently pregnant in second trimester; Tobacco use in pregnancy, antepartum, second trimester; Drug use affecting pregnancy in second trimester; Obesity affecting pregnancy, antepartum; Unwanted fertility; and Anemia in pregnancy on their problem list.  Patient reports general discomforts of pregnancy.  Contractions: Irritability. Vag. Bleeding: None.  Movement: Present. Denies leaking of fluid.   The following portions of the patient's history were reviewed and updated as appropriate: allergies, current medications, past family history, past medical history, past social history, past surgical history and problem list. Problem list updated.  Objective:   Vitals:   11/08/17 0831  BP: 113/77  Pulse: 83  Weight: 222 lb 9.6 oz (101 kg)    Fetal Status: Fetal Heart Rate (bpm): 146   Movement: Present     General:  Alert, oriented and cooperative. Patient is in no acute distress.  Skin: Skin is warm and dry. No rash noted.   Cardiovascular: Normal heart rate noted  Respiratory: Normal respiratory effort, no problems with respiration noted  Abdomen: Soft, gravid, appropriate for gestational age. Pain/Pressure: Present     Pelvic:  Cervical exam performed        Extremities: Normal range of motion.  Edema: Trace  Mental Status: Normal mood and affect. Normal behavior. Normal judgment and thought content.   Urinalysis:      Assessment and Plan:  Pregnancy: [id number] at [redacted]w[redacted]d  1. Supervision of high risk pregnancy, antepartum, second trimester Labor precautions  2. History of preterm  delivery, currently pregnant in second trimester S/P 17 OHP  3. Unwanted fertility BTL papers signed  Term labor symptoms and general obstetric precautions including but not limited to vaginal bleeding, contractions, leaking of fluid and fetal movement were reviewed in detail with the patient. Please refer to After Visit Summary for other counseling recommendations.  Return in about 1 week (around 11/15/2017) for OB visit.   Chancy Milroy, MD

## 2017-11-08 NOTE — Progress Notes (Signed)
Pt states passed mucous plug, having a lot of pressure & real bad leg cramps.

## 2017-11-15 ENCOUNTER — Ambulatory Visit (INDEPENDENT_AMBULATORY_CARE_PROVIDER_SITE_OTHER): Payer: Medicaid Other | Admitting: Obstetrics and Gynecology

## 2017-11-15 ENCOUNTER — Telehealth (HOSPITAL_COMMUNITY): Payer: Self-pay | Admitting: *Deleted

## 2017-11-15 ENCOUNTER — Encounter: Payer: Self-pay | Admitting: Obstetrics and Gynecology

## 2017-11-15 ENCOUNTER — Encounter (HOSPITAL_COMMUNITY): Payer: Self-pay | Admitting: *Deleted

## 2017-11-15 ENCOUNTER — Inpatient Hospital Stay (HOSPITAL_COMMUNITY)
Admission: AD | Admit: 2017-11-15 | Discharge: 2017-11-17 | DRG: 798 | Disposition: A | Discharge status: Home / Self Care | Payer: Medicare Other | Attending: Obstetrics & Gynecology | Admitting: Obstetrics & Gynecology

## 2017-11-15 VITALS — BP 105/71 | HR 98 | Wt 225.3 lb

## 2017-11-15 DIAGNOSIS — O0992 Supervision of high risk pregnancy, unspecified, second trimester: Secondary | ICD-10-CM

## 2017-11-15 DIAGNOSIS — O99332 Smoking (tobacco) complicating pregnancy, second trimester: Secondary | ICD-10-CM

## 2017-11-15 DIAGNOSIS — F17210 Nicotine dependence, cigarettes, uncomplicated: Secondary | ICD-10-CM | POA: Diagnosis present

## 2017-11-15 DIAGNOSIS — O99019 Anemia complicating pregnancy, unspecified trimester: Secondary | ICD-10-CM | POA: Diagnosis present

## 2017-11-15 DIAGNOSIS — Z3A39 39 weeks gestation of pregnancy: Secondary | ICD-10-CM

## 2017-11-15 DIAGNOSIS — O9902 Anemia complicating childbirth: Secondary | ICD-10-CM | POA: Diagnosis present

## 2017-11-15 DIAGNOSIS — J45909 Unspecified asthma, uncomplicated: Secondary | ICD-10-CM | POA: Diagnosis present

## 2017-11-15 DIAGNOSIS — D649 Anemia, unspecified: Secondary | ICD-10-CM | POA: Diagnosis present

## 2017-11-15 DIAGNOSIS — O09212 Supervision of pregnancy with history of pre-term labor, second trimester: Secondary | ICD-10-CM

## 2017-11-15 DIAGNOSIS — O99214 Obesity complicating childbirth: Secondary | ICD-10-CM | POA: Diagnosis present

## 2017-11-15 DIAGNOSIS — F319 Bipolar disorder, unspecified: Secondary | ICD-10-CM | POA: Diagnosis present

## 2017-11-15 DIAGNOSIS — O9952 Diseases of the respiratory system complicating childbirth: Secondary | ICD-10-CM | POA: Diagnosis present

## 2017-11-15 DIAGNOSIS — O09892 Supervision of other high risk pregnancies, second trimester: Secondary | ICD-10-CM

## 2017-11-15 DIAGNOSIS — Z3009 Encounter for other general counseling and advice on contraception: Secondary | ICD-10-CM

## 2017-11-15 DIAGNOSIS — Z302 Encounter for sterilization: Secondary | ICD-10-CM

## 2017-11-15 DIAGNOSIS — O99322 Drug use complicating pregnancy, second trimester: Secondary | ICD-10-CM | POA: Diagnosis present

## 2017-11-15 DIAGNOSIS — O99013 Anemia complicating pregnancy, third trimester: Secondary | ICD-10-CM

## 2017-11-15 DIAGNOSIS — O99334 Smoking (tobacco) complicating childbirth: Secondary | ICD-10-CM | POA: Diagnosis present

## 2017-11-15 DIAGNOSIS — O480 Post-term pregnancy: Secondary | ICD-10-CM | POA: Diagnosis present

## 2017-11-15 DIAGNOSIS — O99210 Obesity complicating pregnancy, unspecified trimester: Secondary | ICD-10-CM

## 2017-11-15 MED ORDER — PROMETHAZINE HCL 25 MG/ML IJ SOLN
25.0000 mg | Freq: Once | INTRAMUSCULAR | Status: AC
  Administered 2017-11-15: 25 mg via INTRAMUSCULAR
  Filled 2017-11-15: qty 1

## 2017-11-15 MED ORDER — BUTORPHANOL TARTRATE 1 MG/ML IJ SOLN
2.0000 mg | Freq: Once | INTRAMUSCULAR | Status: AC
  Administered 2017-11-15: 2 mg via INTRAMUSCULAR
  Filled 2017-11-15: qty 2

## 2017-11-15 NOTE — Progress Notes (Signed)
   PRENATAL VISIT NOTE  Subjective:  [name] is a 32 y.o. [phone] at [redacted]w[redacted]d being seen today for ongoing prenatal care.  She is currently monitored for the following issues for this high-risk pregnancy and has Migraine; Asthma; Chronic pain syndrome; Neck pain; Back pain; Osteoarthritis; Herniated nucleus pulposus, L4-5 left; Supervision of high risk pregnancy, antepartum, second trimester; History of preterm delivery, currently pregnant in second trimester; Tobacco use in pregnancy, antepartum, second trimester; Drug use affecting pregnancy in second trimester; Obesity affecting pregnancy, antepartum; Unwanted fertility; and Anemia in pregnancy on their problem list.  Patient reports some nausea and vomiting, diarrhea for several days. No other sick contacts in house..  Contractions: Irritability. Vag. Bleeding: None.  Movement: Present. Denies leaking of fluid.   The following portions of the patient's history were reviewed and updated as appropriate: allergies, current medications, past family history, past medical history, past social history, past surgical history and problem list. Problem list updated.  Objective:   Vitals:   11/15/17 0856  BP: 105/71  Pulse: 98  Weight: 225 lb 4.8 oz (102.2 kg)    Fetal Status: Fetal Heart Rate (bpm): 151   Movement: Present     General:  Alert, oriented and cooperative. Patient is in no acute distress.  Skin: Skin is warm and dry. No rash noted.   Cardiovascular: Normal heart rate noted  Respiratory: Normal respiratory effort, no problems with respiration noted  Abdomen: Soft, gravid, appropriate for gestational age.  Pain/Pressure: Present     Pelvic: Cervical exam deferred        Extremities: Normal range of motion.  Edema: Trace  Mental Status: Normal mood and affect. Normal behavior. Normal judgment and thought content.   Fern: negative  Assessment and Plan:  Pregnancy: [id number] at [redacted]w[redacted]d  1. Supervision of high risk  pregnancy, antepartum, second trimester   2. History of preterm delivery, currently pregnant in second trimester S/p 17P  3. Unwanted fertility For BTL  4. Anemia during pregnancy in third trimester  5. Tobacco use in pregnancy, antepartum, second trimester Smoking 1/2 PPD  Preterm labor symptoms and general obstetric precautions including but not limited to vaginal bleeding, contractions, leaking of fluid and fetal movement were reviewed in detail with the patient. Please refer to After Visit Summary for other counseling recommendations.  Return in about 1 week (around 11/22/2017) for OB visit (MD).  Future Appointments  Date Time Provider [place]  11/22/2017  8:15 AM Chancy Milroy, MD [facility] [place]  11/26/2017  7:30 AM [facility] ROOM [facility] None    Sloan Leiter, MD

## 2017-11-15 NOTE — Addendum Note (Signed)
Addended by: Vivien Rota on: 11/15/2017 11:29 AM   Modules accepted: Orders, SmartSet

## 2017-11-15 NOTE — Telephone Encounter (Signed)
Preadmission screen  

## 2017-11-15 NOTE — Progress Notes (Signed)
Pt states has had vomiting and Diarrhea for the last 4 days.

## 2017-11-15 NOTE — MAU Note (Signed)
Was seen in clinic this am and 2cm. Ctxs started about 2030 after going for a walk earlier. Pt very uncomfortable and brought to Rm#2 by w/c

## 2017-11-16 ENCOUNTER — Encounter (HOSPITAL_COMMUNITY)
Admission: AD | Disposition: A | Discharge status: Home / Self Care | Payer: Self-pay | Source: Home / Self Care | Attending: Obstetrics & Gynecology

## 2017-11-16 ENCOUNTER — Inpatient Hospital Stay (HOSPITAL_COMMUNITY): Payer: Medicare Other | Admitting: Anesthesiology

## 2017-11-16 ENCOUNTER — Encounter (HOSPITAL_COMMUNITY): Payer: Self-pay

## 2017-11-16 DIAGNOSIS — Z302 Encounter for sterilization: Secondary | ICD-10-CM | POA: Diagnosis not present

## 2017-11-16 DIAGNOSIS — J45909 Unspecified asthma, uncomplicated: Secondary | ICD-10-CM | POA: Diagnosis present

## 2017-11-16 DIAGNOSIS — O99334 Smoking (tobacco) complicating childbirth: Secondary | ICD-10-CM | POA: Diagnosis present

## 2017-11-16 DIAGNOSIS — D649 Anemia, unspecified: Secondary | ICD-10-CM | POA: Diagnosis present

## 2017-11-16 DIAGNOSIS — O9902 Anemia complicating childbirth: Secondary | ICD-10-CM | POA: Diagnosis present

## 2017-11-16 DIAGNOSIS — O99214 Obesity complicating childbirth: Secondary | ICD-10-CM | POA: Diagnosis present

## 2017-11-16 DIAGNOSIS — Z3483 Encounter for supervision of other normal pregnancy, third trimester: Secondary | ICD-10-CM | POA: Diagnosis present

## 2017-11-16 DIAGNOSIS — O480 Post-term pregnancy: Secondary | ICD-10-CM | POA: Diagnosis present

## 2017-11-16 DIAGNOSIS — F17210 Nicotine dependence, cigarettes, uncomplicated: Secondary | ICD-10-CM | POA: Diagnosis present

## 2017-11-16 DIAGNOSIS — Z3A39 39 weeks gestation of pregnancy: Secondary | ICD-10-CM

## 2017-11-16 DIAGNOSIS — O9952 Diseases of the respiratory system complicating childbirth: Secondary | ICD-10-CM | POA: Diagnosis present

## 2017-11-16 HISTORY — PX: TUBAL LIGATION: SHX77

## 2017-11-16 LAB — TYPE AND SCREEN
ABO/RH(D): O POS
ANTIBODY SCREEN: NEGATIVE

## 2017-11-16 LAB — CBC
HEMATOCRIT: 31.8 % — AB (ref 36.0–46.0)
Hemoglobin: 10.7 g/dL — ABNORMAL LOW (ref 12.0–15.0)
MCH: 28.8 pg (ref 26.0–34.0)
MCHC: 33.6 g/dL (ref 30.0–36.0)
MCV: 85.7 fL (ref 78.0–100.0)
PLATELETS: 321 10*3/uL (ref 150–400)
RBC: 3.71 MIL/uL — ABNORMAL LOW (ref 3.87–5.11)
RDW: 14.9 % (ref 11.5–15.5)
WBC: 16.8 10*3/uL — AB (ref 4.0–10.5)

## 2017-11-16 LAB — RAPID URINE DRUG SCREEN, HOSP PERFORMED
Amphetamines: NOT DETECTED
BARBITURATES: NOT DETECTED
Benzodiazepines: NOT DETECTED
COCAINE: NOT DETECTED
OPIATES: POSITIVE — AB
Tetrahydrocannabinol: POSITIVE — AB

## 2017-11-16 LAB — ABO/RH: ABO/RH(D): O POS

## 2017-11-16 LAB — RPR: RPR Ser Ql: NONREACTIVE

## 2017-11-16 SURGERY — LIGATION, FALLOPIAN TUBE, POSTPARTUM
Anesthesia: Epidural | Site: Abdomen | Laterality: Bilateral | Wound class: Clean Contaminated

## 2017-11-16 MED ORDER — OXYCODONE HCL 5 MG PO TABS
5.0000 mg | ORAL_TABLET | Freq: Once | ORAL | Status: AC
  Administered 2017-11-16: 5 mg via ORAL
  Filled 2017-11-16: qty 1

## 2017-11-16 MED ORDER — ONDANSETRON HCL 4 MG PO TABS: 4.0000 mg | ORAL_TABLET | ORAL | Status: DC | PRN

## 2017-11-16 MED ORDER — OXYCODONE-ACETAMINOPHEN 5-325 MG PO TABS: 1.0000 | ORAL_TABLET | ORAL | Status: DC | PRN

## 2017-11-16 MED ORDER — OXYTOCIN BOLUS FROM INFUSION
500.0000 mL | Freq: Once | INTRAVENOUS | Status: AC
  Administered 2017-11-16: 500 mL via INTRAVENOUS

## 2017-11-16 MED ORDER — BISMUTH SUBSALICYLATE 262 MG/15ML PO SUSP: 30.0000 mL | Freq: Three times a day (TID) | ORAL | Status: DC | PRN

## 2017-11-16 MED ORDER — CALCIUM CARBONATE ANTACID 500 MG PO CHEW
1.0000 | CHEWABLE_TABLET | Freq: Three times a day (TID) | ORAL | Status: DC | PRN
  Administered 2017-11-16 (×2): 200 mg via ORAL
  Filled 2017-11-16 (×2): qty 1

## 2017-11-16 MED ORDER — OXYCODONE-ACETAMINOPHEN 5-325 MG PO TABS: 2.0000 | ORAL_TABLET | ORAL | Status: DC | PRN

## 2017-11-16 MED ORDER — ACETAMINOPHEN 325 MG PO TABS
650.0000 mg | ORAL_TABLET | ORAL | Status: DC | PRN
  Administered 2017-11-16 – 2017-11-17 (×4): 650 mg via ORAL
  Filled 2017-11-16 (×5): qty 2

## 2017-11-16 MED ORDER — ZOLPIDEM TARTRATE 5 MG PO TABS: 5.0000 mg | ORAL_TABLET | Freq: Every evening | ORAL | Status: DC | PRN

## 2017-11-16 MED ORDER — IBUPROFEN 600 MG PO TABS
600.0000 mg | ORAL_TABLET | Freq: Four times a day (QID) | ORAL | Status: DC
  Administered 2017-11-16 – 2017-11-17 (×3): 600 mg via ORAL
  Filled 2017-11-16 (×4): qty 1

## 2017-11-16 MED ORDER — WITCH HAZEL-GLYCERIN EX PADS: 1.0000 "application " | MEDICATED_PAD | CUTANEOUS | Status: DC | PRN

## 2017-11-16 MED ORDER — LACTATED RINGERS IV SOLN
INTRAVENOUS | Status: DC | PRN
  Administered 2017-11-16: 10:00:00 via INTRAVENOUS

## 2017-11-16 MED ORDER — FAMOTIDINE 20 MG PO TABS
40.0000 mg | ORAL_TABLET | Freq: Once | ORAL | Status: AC
  Administered 2017-11-16: 40 mg via ORAL
  Filled 2017-11-16: qty 2

## 2017-11-16 MED ORDER — METOCLOPRAMIDE HCL 10 MG PO TABS
10.0000 mg | ORAL_TABLET | Freq: Once | ORAL | Status: AC
  Administered 2017-11-16: 10 mg via ORAL
  Filled 2017-11-16: qty 1

## 2017-11-16 MED ORDER — BUPIVACAINE HCL (PF) 0.5 % IJ SOLN
INTRAMUSCULAR | Status: AC
  Filled 2017-11-16: qty 30

## 2017-11-16 MED ORDER — EPHEDRINE 5 MG/ML INJ
10.0000 mg | INTRAVENOUS | Status: DC | PRN
  Filled 2017-11-16: qty 2

## 2017-11-16 MED ORDER — PHENYLEPHRINE 40 MCG/ML (10ML) SYRINGE FOR IV PUSH (FOR BLOOD PRESSURE SUPPORT)
PREFILLED_SYRINGE | INTRAVENOUS | Status: AC
  Filled 2017-11-16: qty 20

## 2017-11-16 MED ORDER — PHENYLEPHRINE 40 MCG/ML (10ML) SYRINGE FOR IV PUSH (FOR BLOOD PRESSURE SUPPORT)
80.0000 ug | PREFILLED_SYRINGE | INTRAVENOUS | Status: DC | PRN
  Filled 2017-11-16: qty 5

## 2017-11-16 MED ORDER — MISOPROSTOL 25 MCG QUARTER TABLET
25.0000 ug | ORAL_TABLET | ORAL | Status: DC | PRN
  Filled 2017-11-16: qty 1

## 2017-11-16 MED ORDER — KETOROLAC TROMETHAMINE 30 MG/ML IJ SOLN: 30.0000 mg | Freq: Once | INTRAMUSCULAR | Status: AC | PRN

## 2017-11-16 MED ORDER — HYDROMORPHONE HCL 1 MG/ML IJ SOLN
INTRAMUSCULAR | Status: AC
  Filled 2017-11-16: qty 0.5

## 2017-11-16 MED ORDER — SODIUM CHLORIDE 0.9 % IR SOLN
Status: DC | PRN
  Administered 2017-11-16: 1

## 2017-11-16 MED ORDER — FENTANYL CITRATE (PF) 100 MCG/2ML IJ SOLN
INTRAMUSCULAR | Status: AC
  Administered 2017-11-16: 100 ug via INTRAVENOUS
  Filled 2017-11-16: qty 2

## 2017-11-16 MED ORDER — LACTATED RINGERS IV SOLN: 500.0000 mL | Freq: Once | INTRAVENOUS | Status: DC

## 2017-11-16 MED ORDER — LIDOCAINE HCL (PF) 1 % IJ SOLN
30.0000 mL | INTRAMUSCULAR | Status: DC | PRN
  Administered 2017-11-16: 30 mL via SUBCUTANEOUS
  Filled 2017-11-16: qty 30

## 2017-11-16 MED ORDER — ONDANSETRON HCL 4 MG/2ML IJ SOLN: 4.0000 mg | Freq: Four times a day (QID) | INTRAMUSCULAR | Status: DC | PRN

## 2017-11-16 MED ORDER — BUPIVACAINE HCL 0.5 % IJ SOLN
INTRAMUSCULAR | Status: DC | PRN
  Administered 2017-11-16: 15 mL

## 2017-11-16 MED ORDER — FENTANYL CITRATE (PF) 100 MCG/2ML IJ SOLN
100.0000 ug | INTRAMUSCULAR | Status: DC | PRN
  Administered 2017-11-16: 100 ug via INTRAVENOUS

## 2017-11-16 MED ORDER — DIBUCAINE 1 % RE OINT: 1.0000 "application " | TOPICAL_OINTMENT | RECTAL | Status: DC | PRN

## 2017-11-16 MED ORDER — COCONUT OIL OIL: 1.0000 "application " | TOPICAL_OIL | Status: DC | PRN

## 2017-11-16 MED ORDER — HYDROMORPHONE HCL 1 MG/ML IJ SOLN
0.2500 mg | INTRAMUSCULAR | Status: DC | PRN
  Administered 2017-11-16 (×2): 0.5 mg via INTRAVENOUS

## 2017-11-16 MED ORDER — ONDANSETRON HCL 4 MG/2ML IJ SOLN
INTRAMUSCULAR | Status: DC | PRN
  Administered 2017-11-16: 4 mg via INTRAVENOUS

## 2017-11-16 MED ORDER — LIDOCAINE-EPINEPHRINE (PF) 2 %-1:200000 IJ SOLN
INTRAMUSCULAR | Status: AC
  Filled 2017-11-16: qty 20

## 2017-11-16 MED ORDER — ONDANSETRON HCL 4 MG/2ML IJ SOLN: 4.0000 mg | INTRAMUSCULAR | Status: DC | PRN

## 2017-11-16 MED ORDER — FENTANYL 2.5 MCG/ML BUPIVACAINE 1/10 % EPIDURAL INFUSION (WH - ANES)
14.0000 mL/h | INTRAMUSCULAR | Status: DC | PRN
  Administered 2017-11-16: 14 mL/h via EPIDURAL

## 2017-11-16 MED ORDER — SOD CITRATE-CITRIC ACID 500-334 MG/5ML PO SOLN: 30.0000 mL | ORAL | Status: DC | PRN

## 2017-11-16 MED ORDER — ALUM & MAG HYDROXIDE-SIMETH 200-200-20 MG/5ML PO SUSP
30.0000 mL | Freq: Four times a day (QID) | ORAL | Status: DC | PRN
  Administered 2017-11-16: 30 mL via ORAL
  Filled 2017-11-16 (×2): qty 30

## 2017-11-16 MED ORDER — ACETAMINOPHEN 325 MG PO TABS: 650.0000 mg | ORAL_TABLET | ORAL | Status: DC | PRN

## 2017-11-16 MED ORDER — FENTANYL CITRATE (PF) 100 MCG/2ML IJ SOLN
INTRAMUSCULAR | Status: AC
  Filled 2017-11-16: qty 2

## 2017-11-16 MED ORDER — PRENATAL MULTIVITAMIN CH
1.0000 | ORAL_TABLET | Freq: Every day | ORAL | Status: DC
  Administered 2017-11-17: 1 via ORAL

## 2017-11-16 MED ORDER — DIPHENHYDRAMINE HCL 25 MG PO CAPS: 25.0000 mg | ORAL_CAPSULE | Freq: Four times a day (QID) | ORAL | Status: DC | PRN

## 2017-11-16 MED ORDER — LIDOCAINE-EPINEPHRINE (PF) 2 %-1:200000 IJ SOLN
INTRAMUSCULAR | Status: DC | PRN
  Administered 2017-11-16: 5 mL via INTRADERMAL
  Administered 2017-11-16: 2 mL via INTRADERMAL
  Administered 2017-11-16: 5 mL via INTRADERMAL
  Administered 2017-11-16: 3 mL via INTRADERMAL
  Administered 2017-11-16: 5 mL via INTRADERMAL

## 2017-11-16 MED ORDER — LIDOCAINE HCL (PF) 1 % IJ SOLN
INTRAMUSCULAR | Status: DC | PRN
  Administered 2017-11-16: 6 mL via EPIDURAL
  Administered 2017-11-16: 4 mL via EPIDURAL

## 2017-11-16 MED ORDER — SENNOSIDES-DOCUSATE SODIUM 8.6-50 MG PO TABS
2.0000 | ORAL_TABLET | ORAL | Status: DC
  Administered 2017-11-16: 2 via ORAL
  Filled 2017-11-16: qty 2

## 2017-11-16 MED ORDER — ONDANSETRON HCL 4 MG/2ML IJ SOLN
INTRAMUSCULAR | Status: AC
  Filled 2017-11-16: qty 2

## 2017-11-16 MED ORDER — LACTATED RINGERS IV SOLN
INTRAVENOUS | Status: DC
  Administered 2017-11-16: 20 mL/h via INTRAVENOUS

## 2017-11-16 MED ORDER — KETOROLAC TROMETHAMINE 30 MG/ML IJ SOLN
INTRAMUSCULAR | Status: DC | PRN
  Administered 2017-11-16: 30 mg via INTRAVENOUS

## 2017-11-16 MED ORDER — FENTANYL CITRATE (PF) 100 MCG/2ML IJ SOLN
INTRAMUSCULAR | Status: DC | PRN
  Administered 2017-11-16 (×2): 50 ug via INTRAVENOUS

## 2017-11-16 MED ORDER — LACTATED RINGERS IV SOLN
500.0000 mL | INTRAVENOUS | Status: DC | PRN
  Administered 2017-11-16: 1000 mL via INTRAVENOUS

## 2017-11-16 MED ORDER — FENTANYL 2.5 MCG/ML BUPIVACAINE 1/10 % EPIDURAL INFUSION (WH - ANES)
INTRAMUSCULAR | Status: AC
  Filled 2017-11-16: qty 100

## 2017-11-16 MED ORDER — MEPERIDINE HCL 25 MG/ML IJ SOLN: 6.2500 mg | INTRAMUSCULAR | Status: DC | PRN

## 2017-11-16 MED ORDER — LACTATED RINGERS IV SOLN
INTRAVENOUS | Status: DC
  Administered 2017-11-16: 02:00:00 via INTRAVENOUS

## 2017-11-16 MED ORDER — KETOROLAC TROMETHAMINE 30 MG/ML IJ SOLN
INTRAMUSCULAR | Status: AC
  Filled 2017-11-16: qty 1

## 2017-11-16 MED ORDER — BENZOCAINE-MENTHOL 20-0.5 % EX AERO: 1.0000 "application " | INHALATION_SPRAY | CUTANEOUS | Status: DC | PRN

## 2017-11-16 MED ORDER — OXYTOCIN 40 UNITS IN LACTATED RINGERS INFUSION - SIMPLE MED
2.5000 [IU]/h | INTRAVENOUS | Status: DC
  Filled 2017-11-16: qty 1000

## 2017-11-16 MED ORDER — PROMETHAZINE HCL 25 MG/ML IJ SOLN: 6.2500 mg | INTRAMUSCULAR | Status: DC | PRN

## 2017-11-16 MED ORDER — DIPHENHYDRAMINE HCL 50 MG/ML IJ SOLN: 12.5000 mg | INTRAMUSCULAR | Status: DC | PRN

## 2017-11-16 MED ORDER — SIMETHICONE 80 MG PO CHEW
80.0000 mg | CHEWABLE_TABLET | ORAL | Status: DC | PRN
  Administered 2017-11-16: 80 mg via ORAL
  Filled 2017-11-16 (×2): qty 1

## 2017-11-16 MED ORDER — TERBUTALINE SULFATE 1 MG/ML IJ SOLN
0.2500 mg | Freq: Once | INTRAMUSCULAR | Status: DC | PRN
  Filled 2017-11-16: qty 1

## 2017-11-16 SURGICAL SUPPLY — 29 items
CLIP FILSHIE TUBAL LIGA STRL (Clip) ×3 IMPLANT
CLOTH BEACON ORANGE TIMEOUT ST (SAFETY) ×3 IMPLANT
DERMABOND ADVANCED (GAUZE/BANDAGES/DRESSINGS) ×2
DERMABOND ADVANCED .7 DNX12 (GAUZE/BANDAGES/DRESSINGS) ×1 IMPLANT
DRSG OPSITE POSTOP 3X4 (GAUZE/BANDAGES/DRESSINGS) ×3 IMPLANT
DURAPREP 26ML APPLICATOR (WOUND CARE) ×3 IMPLANT
ELECT REM PT RETURN 9FT ADLT (ELECTROSURGICAL) ×3
ELECTRODE REM PT RTRN 9FT ADLT (ELECTROSURGICAL) ×1 IMPLANT
GLOVE BIO SURGEON STRL SZ7 (GLOVE) ×3 IMPLANT
GLOVE BIOGEL PI IND STRL 7.0 (GLOVE) ×1 IMPLANT
GLOVE BIOGEL PI IND STRL 7.5 (GLOVE) ×1 IMPLANT
GLOVE BIOGEL PI INDICATOR 7.0 (GLOVE) ×2
GLOVE BIOGEL PI INDICATOR 7.5 (GLOVE) ×2
GOWN STRL REUS W/TWL LRG LVL3 (GOWN DISPOSABLE) ×6 IMPLANT
NEEDLE HYPO 22GX1.5 SAFETY (NEEDLE) ×3 IMPLANT
NS IRRIG 1000ML POUR BTL (IV SOLUTION) ×3 IMPLANT
PACK ABDOMINAL MINOR (CUSTOM PROCEDURE TRAY) ×3 IMPLANT
PENCIL BUTTON HOLSTER BLD 10FT (ELECTRODE) IMPLANT
PROTECTOR NERVE ULNAR (MISCELLANEOUS) ×3 IMPLANT
SPONGE LAP 18X18 X RAY DECT (DISPOSABLE) ×3 IMPLANT
SPONGE LAP 4X18 X RAY DECT (DISPOSABLE) ×3 IMPLANT
SUT MNCRL AB 4-0 PS2 18 (SUTURE) ×3 IMPLANT
SUT PLAIN 2 0 (SUTURE)
SUT PLAIN ABS 2-0 CT1 27XMFL (SUTURE) IMPLANT
SUT VICRYL 0 TIES 12 18 (SUTURE) IMPLANT
SUT VICRYL 0 UR6 27IN ABS (SUTURE) ×3 IMPLANT
SYR CONTROL 10ML LL (SYRINGE) ×3 IMPLANT
TOWEL OR 17X24 6PK STRL BLUE (TOWEL DISPOSABLE) ×6 IMPLANT
TRAY FOLEY CATH SILVER 14FR (SET/KITS/TRAYS/PACK) ×3 IMPLANT

## 2017-11-16 NOTE — Transfer of Care (Signed)
Immediate Anesthesia Transfer of Care Note  Patient: [name]  Procedure(s) Performed: POST PARTUM TUBAL LIGATION (Bilateral Abdomen)  Patient Location: PACU  Anesthesia Type:Epidural  Level of Consciousness: awake and drowsy  Airway & Oxygen Therapy: Patient Spontanous Breathing  Post-op Assessment: Report given to RN and Post -op Vital signs reviewed and stable  Post vital signs: Reviewed  Last Vitals:  Vitals Value Taken Time  BP    Temp    Pulse 63 11/16/2017 10:42 AM  Resp 12 11/16/2017 10:42 AM  SpO2 95 % 11/16/2017 10:42 AM  Vitals shown include unvalidated device data.  Last Pain:  Vitals:   11/16/17 0800  TempSrc: Oral  PainSc:       Patients Stated Pain Goal: 3 (01/23/12 1438)  Complications: No apparent anesthesia complications

## 2017-11-16 NOTE — Progress Notes (Signed)
OB Note D/w pt and r/b/a d/w her re: BTL. Has had l/s GB and ex lap for possible appy in the past and d/w her that these increase risk of surgical complications. Pt desires to proceed with BTL. Papers are UTD. Can proceed when OR is ready.   Durene Romans MD Attending [facility] for [employer] [facility] (Faculty Practice) 11/16/2017 Time: [phone]

## 2017-11-16 NOTE — Anesthesia Preprocedure Evaluation (Signed)
Anesthesia Evaluation  Patient identified by MRN, date of birth, ID band Patient awake    Reviewed: Allergy & Precautions, NPO status , Patient's Chart, lab work & pertinent test results  History of Anesthesia Complications Negative for: history of anesthetic complications  Airway Mallampati: II  TM Distance: >3 FB Neck ROM: Full    Dental   Pulmonary asthma , Current Smoker,    breath sounds clear to auscultation       Cardiovascular negative cardio ROS   Rhythm:Regular Rate:Normal     Neuro/Psych  Headaches, PSYCHIATRIC DISORDERS Anxiety Depression Bipolar Disorder  Sciatica     GI/Hepatic Neg liver ROS, GERD  Medicated,  Endo/Other  negative endocrine ROS  Renal/GU negative Renal ROS  negative genitourinary   Musculoskeletal  (+) Arthritis ,  Scoliosis Hx back surgery    Abdominal (+) + obese,   Peds  Hematology  (+) anemia ,   Anesthesia Other Findings HPV  Reproductive/Obstetrics (+) Pregnancy  Cervical cancer                              Anesthesia Physical Anesthesia Plan  ASA: III  Anesthesia Plan: Epidural   Post-op Pain Management:    Induction:   PONV Risk Score and Plan: 2 and Treatment may vary due to age or medical condition  Airway Management Planned: Natural Airway  Additional Equipment: None  Intra-op Plan:   Post-operative Plan:   Informed Consent: I have reviewed the patients History and Physical, chart, labs and discussed the procedure including the risks, benefits and alternatives for the proposed anesthesia with the patient or authorized representative who has indicated his/her understanding and acceptance.     Plan Discussed with: Anesthesiologist  Anesthesia Plan Comments: (Labs reviewed. Platelets acceptable, patient not taking any blood thinning medications. Per RN, FHR tracing reported to be stable enough for sitting procedure. Risks  and benefits discussed with patient, including PDPH, backache, epidural hematoma, failed epidural, allergic reaction, and nerve injury. Patient expressed understanding and wished to proceed.)        Anesthesia Quick Evaluation

## 2017-11-16 NOTE — H&P (Addendum)
OB/GYN Faculty Practice History and Physical  [name] is a 32 y.o. female presenting for spontaneous progression of labor.  OB History    Gravida  6   Para  3   Term  2   Preterm  1   AB  2   Living  3     SAB  2   TAB      Ectopic      Multiple      Live Births  3          Past Medical History:  Diagnosis Date  . Abortion in first trimester 08/2016  . Anemia   . Anxiety    Panic attack  . Arthritis   . Asthma   . Bipolar 1 disorder ([place])   . Cancer ([place])   . Cervical cancer ([place])   . Chronic kidney disease    history of kidney shut down  no current problems  . Constipation   . DDD (degenerative disc disease), cervical   . Edema   . Gallstones   . GERD (gastroesophageal reflux disease)   . High cholesterol   . History of anemia   . History of renal failure   . HPV (human papilloma virus) anogenital infection   . Leukocytosis    going to hematologist  . Migraine   . Obesity   . Obesity   . Pelvic inflammatory disease (PID) 05-08-11   previous hx. .Hx. childbirth x3-NVD  . PID (pelvic inflammatory disease)   . PTSD (post-traumatic stress disorder)   . Sciatica   . Scoliosis    Past Surgical History:  Procedure Laterality Date  . ABDOMINAL EXPLORATION SURGERY  approx 32 years old   rlq(due to adhesions around ovaries)-appendix and ovaries remain  . ANTERIOR CERVICAL DECOMP/DISCECTOMY FUSION N/A 12/09/2012   Procedure: ANTERIOR CERVICAL DECOMPRESSION/DISCECTOMY FUSION STRUCTURAL ALLOGRAFT TRESTLE PLATE CERVICAL FIVE-SIX,SIX-SEVEN;  Surgeon: Otilio Connors, MD;  Location: [facility];  Service: Neurosurgery;  Laterality: N/A;  . BACK SURGERY    . CHOLECYSTECTOMY  05/10/2011   Procedure: LAPAROSCOPIC CHOLECYSTECTOMY WITH INTRAOPERATIVE CHOLANGIOGRAM;  Surgeon: Gayland Curry, MD,FACS;  Location: [facility];  Service: General;  Laterality: N/A;  . LUMBAR LAMINECTOMY/DECOMPRESSION MICRODISCECTOMY Left 11/30/2014   Procedure: Left lumbar  four-five Microdiskectomy;  Surgeon: Kristeen Miss, MD;  Location: [facility];  Service: Neurosurgery;  Laterality: Left;  . LUMBAR LAMINECTOMY/DECOMPRESSION MICRODISCECTOMY Left 08/15/2015   Procedure: Left Lumbar four-five Microdiskectomy;  Surgeon: Kristeen Miss, MD;  Location: [facility];  Service: Neurosurgery;  Laterality: Left;  . LUMBAR LAMINECTOMY/DECOMPRESSION MICRODISCECTOMY Left 09/28/2016   Procedure: LEFT LUMBAR FIVE -SACRAL ONE Microdiscectomy;  Surgeon: Kristeen Miss, MD;  Location: [facility];  Service: Neurosurgery;  Laterality: Left;   Family History: family history includes Cancer in her maternal grandfather and mother; Cirrhosis in her paternal grandfather; Colon polyps in her maternal grandmother; Diabetes in her father and mother; Endometriosis in her mother; Heart disease in her father; Prostate cancer in her maternal grandfather. Social History:  reports that she has been smoking cigarettes. She has a 12.00 pack-year smoking history. She has never used smokeless tobacco. She reports that she drank alcohol. She reports that she has current or past drug history. Drugs: Marijuana and Cocaine.     Maternal Diabetes: No Genetic Screening: Normal Maternal Ultrasounds/Referrals: suboptimal imaging Fetal Ultrasounds or other Referrals:  Other:   Maternal Substance Abuse:  No Significant Maternal Medications:  None Significant Maternal Lab Results:  None Other Comments:  hx  bipolar  ROS History Dilation: 4.5 Effacement (%): 100 Station: -3 Exam by:: K. WeissRN Blood pressure 135/79, pulse 94, temperature 97.8 F (36.6 C), resp. rate (!) 22, height _0  (1.6 m), weight 101.2 kg, last menstrual period 01/16/2017, unknown if currently breastfeeding.  Confirmed vertex by US Exam Physical Exam  Prenatal labs: ABO, Rh: O/Positive/-- (03/06 9037) Antibody: Negative (03/06 0938) Rubella: 3.17 (03/06 9558) RPR: Non Reactive (05/31 0901)  HBsAg: Negative (03/06 3167)  HIV: Non  Reactive (05/31 0901)  GBS:   neg  Assessment/Plan: Patient expressing significant pain, epidural authorized although patient says she had one prior that "didn't take".    Hx of bipolar but not currently on medication, will consult SW prior to D/C  Progressed from 2.5 to 4 in ~1hr.  Cat 1 FHM. Admit for labor progression, will evaluate for pitocin once on birthing suites   BTL s/p delivery.  Papers signed 5/31 and patient confirms desire for procedure in MAU  Sherene Sires 11/16/2017, 12:47 AM   Attestation: I agree with the resident's note and documentation. I separately met and examined the patient. She is planning for an epidural.   Tadao Emig S. Juleen China, DO OB/GYN Fellow, Faculty Practice

## 2017-11-16 NOTE — Lactation Note (Signed)
This note was copied from a baby's chart. Lactation Consultation Note  Patient Name: [name] [id number] Date: 11/16/2017 Reason for consult: Initial assessment;Term P4,  female infant 71 hours old. Mom hx of Bi-polar disorder. Per mom, BF other children previously for 2 months. Receives [place] in [place] is to sleepy to BF at this time. Mom will call LC for assistance when infant is cuing again. Mom fitted 20 mm NS due inverted nipple on right breast. Mom's left breast is evert. LC discussed I&O Mom demonstrated hand expression and colostrum expressed from breast. Mom will feed infant according to hunger cues, 8 to 12 times within 24 hours including nights. LC discussed :  outpatient  [place] resources, BF support group, [place] hotline and [facility] [employer].   Maternal Data Formula Feeding for Exclusion: No Has patient been taught Hand Expression?: Yes Does the patient have breastfeeding experience prior to this delivery?: Yes  Feeding Feeding Type: Breast Fed Length of feed: 30 min  LATCH Score Latch: Too sleepy or reluctant, no latch achieved, no sucking elicited.  Audible Swallowing: None  Type of Nipple: Inverted(Right breast is inverted)  Comfort (Breast/Nipple): Soft / non-tender  Hold (Positioning): Assistance needed to correctly position infant at breast and maintain latch.  LATCH Score: 3  Interventions Interventions: Breast feeding basics reviewed;Support pillows;Expressed milk  Lactation Tools Discussed/Used Tools: Nipple Shields Nipple shield size: 20 WIC Program: Yes   Consult Status Consult Status: Follow-up Date: 11/16/17 Follow-up type: In-patient    Vicente Serene 11/16/2017, 7:22 AM

## 2017-11-16 NOTE — Lactation Note (Signed)
This note was copied from a baby's chart. Mother's left nipple very inverted, Mom states "I had to have the shell thing with the holes in it last time."  When questioned, she confirmed she used a shield.  Attempted to latch baby to the right nipple unsuccessfully.  Baby latched easily to left breast.

## 2017-11-16 NOTE — Anesthesia Procedure Notes (Signed)
Epidural Patient location during procedure: OB Start time: 11/16/2017 1:29 AM End time: 11/16/2017 1:34 AM  Staffing Anesthesiologist: Audry Pili, MD Performed: anesthesiologist   Preanesthetic Checklist Completed: patient identified, pre-op evaluation, timeout performed, IV checked, risks and benefits discussed and monitors and equipment checked  Epidural Patient position: sitting Prep: DuraPrep Patient monitoring: continuous pulse ox and blood pressure Approach: midline Location: L2-L3 Injection technique: LOR saline  Needle:  Needle type: Tuohy  Needle gauge: 17 G Needle length: 9 cm Needle insertion depth: 6 cm Catheter size: 19 Gauge Catheter at skin depth: 12 cm Test dose: negative and Other (1% lidocaine)  Assessment Events: blood not aspirated  Additional Notes Patient identified. Risks including, but not limited to, bleeding, infection, nerve damage, paralysis, inadequate analgesia, blood pressure changes, nausea, vomiting, allergic reaction, postpartum back pain, itching, and headache were discussed. Patient expressed understanding and wished to proceed. Sterile prep and drape, including hand hygiene, mask, and sterile gloves were used. The patient was positioned and the spine was prepped. Spinous processes located with significant leftward deviation. The skin was anesthetized with lidocaine. No paraesthesia or other complication noted. The patient did not experience any signs of intravascular injection such as tinnitus or metallic taste in mouth, nor signs of intrathecal spread such as rapid motor block. Please see nursing notes for vital signs. The patient tolerated the procedure well.   Renold Don, MDReason for block:procedure for pain

## 2017-11-16 NOTE — Anesthesia Preprocedure Evaluation (Signed)
Anesthesia Evaluation  Patient identified by MRN, date of birth, ID band Patient awake    Reviewed: Allergy & Precautions, NPO status , Patient's Chart, lab work & pertinent test results  History of Anesthesia Complications Negative for: history of anesthetic complications  Airway Mallampati: II  TM Distance: >3 FB Neck ROM: Full    Dental no notable dental hx. (+) Teeth Intact   Pulmonary asthma , Current Smoker,    breath sounds clear to auscultation       Cardiovascular negative cardio ROS Normal cardiovascular exam Rhythm:Regular Rate:Normal     Neuro/Psych  Headaches, PSYCHIATRIC DISORDERS Anxiety Depression Bipolar Disorder  Sciatica     GI/Hepatic Neg liver ROS, GERD  Medicated,  Endo/Other  Morbid obesity  Renal/GU   negative genitourinary   Musculoskeletal  (+) Arthritis ,  Scoliosis Hx back surgery    Abdominal (+) + obese,   Peds  Hematology  (+) anemia ,   Anesthesia Other Findings HPV  Reproductive/Obstetrics (+) Pregnancy  Cervical cancer                              Anesthesia Physical  Anesthesia Plan  ASA: III  Anesthesia Plan: Epidural   Post-op Pain Management:    Induction:   PONV Risk Score and Plan: 2 and Treatment may vary due to age or medical condition  Airway Management Planned: Natural Airway and Nasal Cannula  Additional Equipment: None  Intra-op Plan:   Post-operative Plan:   Informed Consent: I have reviewed the patients History and Physical, chart, labs and discussed the procedure including the risks, benefits and alternatives for the proposed anesthesia with the patient or authorized representative who has indicated his/her understanding and acceptance.     Plan Discussed with: Anesthesiologist  Anesthesia Plan Comments: (For PPTL with labor epidural In-Situ.)        Anesthesia Quick Evaluation

## 2017-11-16 NOTE — Progress Notes (Signed)
Patient held at [facility] front desk, being interviewed by Set designer. Upon Dr Hatchett's (Anesthesiologist) arrival, decision was made to delay case and bump for another, more emergent case. Patient taken to Recovery room in order to pre-op and dose epidural when advised to do so by Hatchett.   [name], [place] 11/16/2017 9:34 AM

## 2017-11-16 NOTE — Anesthesia Postprocedure Evaluation (Signed)
Anesthesia Post Note  Patient: Brigitte Soderberg  Procedure(s) Performed: AN AD [place]     Patient location during evaluation: Mother Baby Anesthesia Type: Epidural Level of consciousness: awake and alert and oriented Pain management: satisfactory to patient Vital Signs Assessment: post-procedure vital signs reviewed and stable Respiratory status: respiratory function stable Cardiovascular status: stable Postop Assessment: no headache, no backache, epidural receding, patient able to bend at knees, no signs of nausea or vomiting and adequate PO intake Anesthetic complications: no    Last Vitals:  Vitals:   11/16/17 1240 11/16/17 1340  BP:  124/75  Pulse:  72  Resp: 16 18  Temp:  36.9 C  SpO2:  99%    Last Pain:  Vitals:   11/16/17 1340  TempSrc: Oral  PainSc: 5    Pain Goal: Patients Stated Pain Goal: 3 (11/16/17 1240)               Brittanny Levenhagen

## 2017-11-16 NOTE — Progress Notes (Signed)
OB/GYN Faculty Practice: Labor Progress Note  Subjective: Patient lying on side, very upset when I entered the room. States "please doc, just cut me." States in a lot of pain because of back surgeries, baby "sitting on my spinal cord."   Objective: BP 135/79 (BP Location: Right Arm)   Pulse 94   Temp 97.8 F (36.6 C)   Resp (!) 22   Ht 5\' 3"  (1.6 m)   Wt 101.2 kg   LMP 01/16/2017 (Approximate)   BMI 39.50 kg/m  Gen: crying, lying on left side Dilation: 4.5 Effacement (%): 100 Cervical Position: Middle Station: -3 Presentation: Vertex Exam by:: K. WeissRN  Assessment and Plan: 32 y.o. [id number] [redacted]w[redacted]d here with spontaneous onset of labor.   Labor: Spontaneous, expectant. Extremely upset on arrival. Explained would not recommend primary C-section as no clinical indication at this time. Patient very upset by this, explained risks of surgery.  -- will defer starting pitocin, AROM unless change in labor curve  -- pain control: desires epidural   Fetal Well-Being: EFW 7-8lbs by Leopold's. Cephalic by sutures per nurse check in MAU.  -- Category I - continuous fetal monitoring  -- GBS (-)   [name] S. Juleen China, DO OB/GYN Fellow, Faculty Practice  1:22 AM

## 2017-11-16 NOTE — Anesthesia Postprocedure Evaluation (Signed)
Anesthesia Post Note  Patient: Renesme Kerrigan Gehring  Procedure(s) Performed: POST PARTUM TUBAL LIGATION (Bilateral Abdomen)     Patient location during evaluation: PACU Anesthesia Type: Epidural Level of consciousness: awake and sedated Pain management: pain level controlled Vital Signs Assessment: post-procedure vital signs reviewed and stable Respiratory status: spontaneous breathing Cardiovascular status: stable Postop Assessment: no headache, no backache, epidural receding, patient able to bend at knees and no apparent nausea or vomiting Anesthetic complications: no    Last Vitals:  Vitals:   11/16/17 1115 11/16/17 1130  BP: 99/66 100/61  Pulse: (!) 58 62  Resp: 12 10  Temp:    SpO2: 96% 95%    Last Pain:  Vitals:   11/16/17 1115  TempSrc:   PainSc: 4    Pain Goal: Patients Stated Pain Goal: 3 (11/16/17 0112)          L Sensory Level: L1-Inguinal (groin) region (11/16/17 1130) R Sensory Level: L1-Inguinal (groin) region (11/16/17 1130)  Catina Nuss JR,JOHN Mateo Flow

## 2017-11-16 NOTE — Op Note (Addendum)
Operative Note   11/16/2017  PRE-OP DIAGNOSIS: Desire for permanent sterilization.  Postpartum Day #0 BMI 39   POST-OP DIAGNOSIS: Same  SURGEON: Surgeon(s) and Role:    * Aletha Halim, MD - Primary  ASSISTANT: None  ANESTHESIA: epidural and local  PROCEDURE: mini-laparotomy, bilateral tubal ligation via Filshie Clips method  ESTIMATED BLOOD LOSS: 31mL  DRAINS: per anesthesia note  TOTAL IV FLUIDS: per anesthesia note  SPECIMENS:  none  VTE PROPHYLAXIS: SCDs to the bilateral lower extremities  ANTIBIOTICS: not indicated  COMPLICATIONS: none  DISPOSITION: PACU - hemodynamically stable.  CONDITION: stable  FINDINGS: No intra-abdominal adhesions noted. Smooth, normally contoured uterine fundus and bilateral tubes. Normal appearing tubes bilaterally. Left ovary seen and small, normal appearing.   PROCEDURE IN DETAIL: The patient was taken to the OR where anesthesia was administed. The patient was positioned in dorsal supine. The patient was prepped and draped in the normal sterile fashion a 4cm horizontal incision was made approximately 2 finger breadths below the umbilicus, after injection with local anesthesia. The skin was then incised with the scalpel and the underlying tissue dissected with the bovie and the fascia nicked in the midline with the scalpel and then extended laterally sharply.  The abdomen was then entered bluntly and a moist lap sponge used to displace the bowel.    The left Fallopian tube was identified by tracing out to the fimbraie, grasped with the Babcock clamps. An avascular midsection of the tube approximately 3-4cm from the cornua was grasped with the babcock clamps and the filshie clip was applied, taking care to incorporate the entire tube.  Attention was then turned to the right fallopian tube after confirmation by tracing the tube out to the fimbriae. The same procedure was then performed on the right Fallopian tube, with excellent hemostasis was  noted from both BTL sites.   The lap sponge was then removed from the abdomen and the fascia closed in running fashion with 0 vicryl and the subcutaneous tissue irrigated and closed with interrupted sutures of 3-0 plain gut. The skin was then closed with 4-0 monocryl and dermabond.    The patient tolerated the procedure well. All counts were correct x 2. The patient was transferred to the recovery room awake, alert and breathing independently.   Durene Romans MD Attending Center for [employer] Fish farm manager)

## 2017-11-16 NOTE — Anesthesia Postprocedure Evaluation (Signed)
Anesthesia Post Note  Patient: Dennise Raabe  Procedure(s) Performed: AN AD [place]     Patient location during evaluation: Mother Baby Anesthesia Type: Epidural Level of consciousness: awake and alert and oriented Pain management: satisfactory to patient Vital Signs Assessment: post-procedure vital signs reviewed and stable Respiratory status: respiratory function stable Cardiovascular status: stable Postop Assessment: no headache, no backache, epidural receding, patient able to bend at knees, no signs of nausea or vomiting and adequate PO intake Anesthetic complications: no    Last Vitals:  Vitals:   11/16/17 0546 11/16/17 0600  BP: (!) 97/53 (!) 120/59  Pulse: 64 66  Resp: 18 18  Temp: 36.5 C 36.6 C  SpO2:  99%    Last Pain:  Vitals:   11/16/17 0600  TempSrc: Oral  PainSc:    Pain Goal: Patients Stated Pain Goal: 3 (11/16/17 0112)               Katherina Mires

## 2017-11-17 ENCOUNTER — Encounter (HOSPITAL_COMMUNITY): Payer: Self-pay | Admitting: Obstetrics and Gynecology

## 2017-11-17 MED ORDER — IBUPROFEN 600 MG PO TABS: 600.0000 mg | ORAL_TABLET | Freq: Four times a day (QID) | ORAL | 0 refills | Status: DC

## 2017-11-17 NOTE — Lactation Note (Signed)
This note was copied from a baby's chart. Lactation Consultation Note  Patient Name: [name] [id number] Date: 11/17/2017 Reason for consult: Follow-up assessment;Term;Infant weight loss(rechecking on Nipple Shield sizing - see LC note )  Baby is 22 hours old  Per mom mentioned her areolas are tender/ especially the  Right an the nipple inverted.  LC assessed with moms permission and noted the nipple  To be semi inverted and the areola to be compressible.  LC assessed NS size and the #20 NS was to small to fit over the  Nipple. The baby's mouth is to small for the next size up of the NS #24.  Per mom has DEBP at home, not sure of the name.  LC recommended taking her DEBP kit with her and if her DEBP at home  Is not able to soften breast . To call [facility] for a DEBP loaner.  LC recommended feeding on the left breast / supplementing afterwards.  And post pumping to work on the semi inverted nipple.  While mom was holding baby she latched on the left breast for few sucks and off.  The previous LC on 11-7 explained engorgement prevention and tx Mother informed of post-discharge support and given phone number to the lactation department, including services for phone call assistance; out-patient appointments; and breastfeeding support group. List of other breastfeeding resources in the community given in the handout. Encouraged mother to call for problems or concerns related to breastfeeding.    Maternal Data    Feeding - last fed at 0830  Feeding Type: Breast Fed Length of feed: 20 min  LATCH Score                   Interventions Interventions: Breast feeding basics reviewed  Lactation Tools Discussed/Used Nipple shield size: 20;Other (comment)(right areola tender, and semi compressible, the #20 NS was to small and the #24 NS is to bigh for baby's mouth ) WIC Program: Yes   Consult Status Consult Status: Complete Date: 11/17/17    Myer Haff 11/17/2017,  10:35 AM

## 2017-11-17 NOTE — Progress Notes (Signed)
Reviewed with Mom LEAD.  The benefits of BF reviewed, and drawbacks  of adding formula to baby's nutrition to mom and baby discussed. Pt verbalizes understanding, and states her milk supply will come in, but she wants to Br and  Bottle feed her baby.   Edmonia Caprio RN

## 2017-11-17 NOTE — Clinical Social Work Maternal (Addendum)
CLINICAL SOCIAL WORK MATERNAL/CHILD NOTE  Patient Details  Name: [name] MRN: [id number] Date of Birth: 10/28/1985  Date:  12/31/2017  Clinical Social Worker Initiating Note:  Madilyn Fireman, MSW, LCSW-A Date/Time: Initiated:  11/17/17/1304     Child's Name:  [name]   Biological Parents:  Mother, Father   Need for Interpreter:  None   Reason for Referral:  Current Substance Use/Substance Use During Pregnancy    Address:  [place] [address]    Phone number:  [phone] (home)     Additional phone number: [phone]  Household Members/Support Persons (HM/SP):   Household Member/Support Person 1   HM/SP Name Relationship DOB or Age  HM/SP -1 [place], [place] [phone]  HM/SP -2        HM/SP -3        HM/SP -4        HM/SP -5        HM/SP -6        HM/SP -7        HM/SP -8          Natural Supports (not living in the home):  Extended Family, Immediate Family, Friends   Professional Supports: Case Metallurgist   Employment: Unemployed   Type of Work:     Education:  Programmer, systems   Homebound arranged:    Museum/gallery curator Resources:  [employer]   Other Resources:  [employer], Physicist, medical , [employer]    Cultural/Religious Considerations Which May Impact Care:  Non-demoninational  Strengths:  Ability to meet basic needs , Engineer, materials, Home prepared for child    Psychotropic Medications:         Pediatrician:    [place] area  Pediatrician List:   [facility] ([address])  [place]      Pediatrician Fax Number:    Risk Factors/Current Problems:      Cognitive State:  Able to Concentrate , Alert    Mood/Affect:  Comfortable , Animated, Happy , Interested , Bright    CSW Assessment: CSW received consult for MOB due to history of anxiety, bipolar,  PTSD, and recent substance use. CSW met with MOB, FOB Jonell Cluck, and baby Anikka to complete assessment. CSW obtained permission from MOB to discuss with FOB present. CSW inquired about MOB's mental health history, MOB confirms diagnosis but states "she deals with them fine." MOB stated all her diagnoses came from years ago when she was "going through it." MOB reports not being on any psychotropic medications. CSW educated MOB on postpartum depression versus baby blues period, MOB agreeable to reach out to her OB if she has issues controlling her mood. MOB and CSW discussed recent substance use during pregnancy. MOB was very open with CSW regarding her use, stating that her cocaine use was recreational but she uses marijuana on a frequently basis. MOB reports that her last use of marijuana was "60-70 days ago." MOB UDS was positive for opiates upon admission, MOB reports [facility] for nerve pain in her back. CSW was not able to locate that information in MOB's chart. CSW informed MOB of hospital drug screening policies, MOB stated understanding and did not have questions. CSW informed MOB that baby's UDS was negative but that if any substances came  back on the baby's cord that a report would be made to [facility] CPS, MOB understanding. MOB reports a brief CPS history in the past. MOB reports her oldest daughter is 45 years old and lives with her father. MOB reports no history of parental rights termination. MOB has a new car seat for safe transportation. MOB reports having a bassinet and pack and play at home for safe sleeping. SIDS precautions thoroughly reviewed, MOB asked appropriate questions. MOB has chosen [facility] for pediatric care. MOB reports that she had support from an OBCM during her pregnancy.  CSW and MOB discussed ways to deal with life and stress without using illegal substances for recreational use. MOB reports her children are always  taking care of and do not want or need for anything. MOB receives [employer], Medicaid, Physicist, medical, and Housing at [employer]. MOB was pleasant and easy to engage in conversation with. CSW encouraged MOB to reach out for assistance if needs arise, MOB agreeable. No further questions or needs to address.  CSW will monitor chart for cord results and will make report if warranted.  CSW Plan/Description:  No Further Intervention Required/No Barriers to Discharge, CSW Will Continue to Monitor Umbilical Cord Tissue Drug Screen Results and Make Report if Warranted, [place], [place] 11/17/2017, 1:09 PM

## 2017-11-17 NOTE — Discharge Summary (Signed)
OB Discharge Summary  Patient Name: [name] DOB: [birth year] MRN: [id number]  Date of admission: 11/15/2017 Delivering MD: Glenice Bow   Date of discharge: 11/17/2017  Admitting diagnosis: CTX Intrauterine pregnancy: [redacted]w[redacted]d     Secondary diagnosis:Active Problems:   Asthma   Supervision of high risk pregnancy, antepartum, second trimester   History of preterm delivery, currently pregnant in second trimester   Drug use affecting pregnancy in second trimester   Obesity affecting pregnancy, antepartum   Anemia in pregnancy   Bipolar disorder ([place])   Vaginal delivery  Additional problems:none     Discharge diagnosis: Term Pregnancy Delivered                                                                     Post partum procedures:postpartum tubal ligation  Augmentation: n/a  Complications: None  Hospital course:  Onset of Labor With Vaginal Delivery     32 y.o. yo [id number] at [redacted]w[redacted]d was admitted in Active Labor on 11/15/2017. Patient had an uncomplicated labor course as follows:  Membrane Rupture Time/Date:   ,    Intrapartum Procedures: Episiotomy: None [1]                                         Lacerations:  Labial [10]  Patient had a delivery of a Viable infant. 11/16/2017  Information for the patient's newborn:  Renita, Brocks Girl Carletha [[id number]]  Delivery Method: Vaginal, Spontaneous(Filed from Delivery Summary)    Pateint had an uncomplicated postpartum course.  She is ambulating, tolerating a regular diet, passing flatus, and urinating well. Patient is discharged home in stable condition on 11/17/17.   Physical exam  Vitals:   11/16/17 1340 11/16/17 1730 11/16/17 2020 11/16/17 2146  BP: 124/75 129/70 122/70 137/90  Pulse: 72 78 71 84  Resp: 18 18 20 18   Temp: 98.5 F (36.9 C) 97.8 F (36.6 C) 98.4 F (36.9 C) 97.9 F (36.6 C)  TempSrc: Oral Oral Oral Oral  SpO2: 99% 99% 98%   Weight:      Height:       General: alert, cooperative and  no distress Lochia: appropriate Uterine Fundus: firm Incision: Healing well with no significant drainage, No significant erythema, Dressing is clean, dry, and intact DVT Evaluation: No evidence of DVT seen on physical exam. Labs: Lab Results  Component Value Date   WBC 16.8 (H) 11/16/2017   HGB 10.7 (L) 11/16/2017   HCT 31.8 (L) 11/16/2017   MCV 85.7 11/16/2017   PLT 321 11/16/2017   CMP Latest Ref Rng & Units 11/19/2016  Glucose 65 - 99 mg/dL 84  BUN 6 - 20 mg/dL 19  Creatinine 0.44 - 1.00 mg/dL 1.03(H)  Sodium 135 - 145 mmol/L 137  Potassium 3.5 - 5.1 mmol/L 3.5  Chloride 101 - 111 mmol/L 108  CO2 22 - 32 mmol/L 24  Calcium 8.9 - 10.3 mg/dL 8.6(L)  Total Protein 6.5 - 8.1 g/dL 6.4(L)  Total Bilirubin 0.3 - 1.2 mg/dL 0.3  Alkaline Phos 38 - 126 U/L 54  AST 15 - 41 U/L 17  ALT 14 - 54 U/L 16  Discharge instruction: per After Visit Summary and "Baby and Me Booklet".  After Visit Meds:  Allergies as of 11/17/2017      Reactions   Banana Itching     Itchy throat   Latex Itching, Rash   Red spots   Orange Fruit [citrus] Other (See Comments)   Mouth pain, acid reflux    Miconazole Rash, Other (See Comments)   Paradoxical effect: increases yeast (also)      Medication List    TAKE these medications   cyclobenzaprine 5 MG tablet Commonly known as:  FLEXERIL TAKE 1 TABLET (5 MG TOTAL) BY MOUTH EVERY 8 (EIGHT) HOURS AS NEEDED FOR MUSCLE SPASMS.   ibuprofen 600 MG tablet Commonly known as:  ADVIL,MOTRIN Take 1 tablet (600 mg total) by mouth every 6 (six) hours.   pantoprazole 20 MG tablet Commonly known as:  PROTONIX Take 1 tablet (20 mg total) by mouth daily.   Prenatal Vitamins 0.8 MG tablet Take 1 tablet by mouth daily.   ranitidine 150 MG tablet Commonly known as:  ZANTAC Take 2 tablets (300 mg total) by mouth 2 (two) times daily.       Diet: routine diet  Activity: Advance as tolerated. Pelvic rest for 6 weeks.   Outpatient follow up:6  weeks Follow up Appt:No future appointments. Follow up visit: No follow-ups on file.  Postpartum contraception: Tubal Ligation  Newborn Data: Live born female  Birth Weight: 6 lb 4 oz (2835 g) APGAR: 40, 17  Newborn Delivery   Birth date/time:  11/16/2017 04:08:00 Delivery type:  Vaginal, Spontaneous     Baby Feeding: Bottle Disposition:home with mother   11/17/2017 Koren Shiver, CNM

## 2017-11-17 NOTE — Lactation Note (Signed)
This note was copied from a baby's chart. Lactation Consultation Note  Patient Name: Girl Infiniti Hoefling [id number] Date: 11/17/2017 Reason for consult: Follow-up assessment;Term P4, 24 hr female infant. Per mom her feeding choice is to BF and supplement with formula. Mom alternate feedings between BF infant at breast and next feeding supplement with formula. Mom had previously feed infant before [place] entered room and infant is asleep in basinet.  LC discussed engorgement treatment and prevention with mom.  Maternal Data    Feeding Feeding Type: Breast Fed Length of feed: 10 min  LATCH Score                   Interventions    Lactation Tools Discussed/Used     Consult Status Consult Status: Complete Date: 11/17/17    Vicente Serene 11/17/2017, 5:06 AM

## 2017-11-17 NOTE — Discharge Instructions (Addendum)
Selene verbalizes understanding of D/c instructions. She verbalizes she will call for her F/U appointment, for her and the baby She has resources, phone #'s ,supportive family  to help with the baby.  Edmonia Caprio RN

## 2017-11-18 ENCOUNTER — Encounter (HOSPITAL_COMMUNITY): Payer: Self-pay

## 2017-11-20 ENCOUNTER — Ambulatory Visit (INDEPENDENT_AMBULATORY_CARE_PROVIDER_SITE_OTHER): Payer: Medicaid Other | Admitting: *Deleted

## 2017-11-20 VITALS — BP 115/64

## 2017-11-20 DIAGNOSIS — Z0130 Encounter for examination of blood pressure without abnormal findings: Secondary | ICD-10-CM

## 2017-11-20 NOTE — Progress Notes (Signed)
Pt delivered 4 days ago.  Pt reports increasing swelling in her hands and feet.  Pt denies headaches or blurred vision.  Pt states she called the office and was advised by a "nurse" to come in for a BP check.  BP taking manually, right arm and was 115/64.  Pt was advised to increase fluid intake and elevate feet.  Reviewed s/s HTN and pt was advised to call office if she has any of these symptoms.

## 2017-11-22 ENCOUNTER — Encounter: Payer: Medicaid Other | Admitting: Obstetrics and Gynecology

## 2017-11-26 ENCOUNTER — Inpatient Hospital Stay (HOSPITAL_COMMUNITY): Admission: RE | Admit: 2017-11-26 | Payer: Medicaid Other | Source: Ambulatory Visit

## 2017-11-27 NOTE — Progress Notes (Signed)
I have reviewed the chart and agree with nursing staff's documentation of this patient's encounter.  Aletha Halim, MD 11/27/2017 11:46 AM

## 2017-11-29 ENCOUNTER — Ambulatory Visit (INDEPENDENT_AMBULATORY_CARE_PROVIDER_SITE_OTHER): Payer: Medicaid Other | Admitting: *Deleted

## 2017-11-29 VITALS — BP 133/86 | HR 72 | Wt 216.8 lb

## 2017-11-29 DIAGNOSIS — Z5189 Encounter for other specified aftercare: Secondary | ICD-10-CM

## 2017-11-29 NOTE — Progress Notes (Signed)
Pt here for a wound check.  Pt reports she is having itching at the site but she denies any other s/s of infection.  On assessment wound appears approximated without s/s of infection.

## 2017-11-29 NOTE — Progress Notes (Signed)
I agree with nurses note and plan of care.   Noni Saupe I, NP 11/29/2017 3:50 PM

## 2017-12-02 ENCOUNTER — Encounter: Payer: Self-pay | Admitting: *Deleted

## 2017-12-07 ENCOUNTER — Other Ambulatory Visit: Payer: Self-pay | Admitting: Obstetrics & Gynecology

## 2017-12-07 DIAGNOSIS — O0992 Supervision of high risk pregnancy, unspecified, second trimester: Secondary | ICD-10-CM

## 2017-12-07 DIAGNOSIS — O99332 Smoking (tobacco) complicating pregnancy, second trimester: Secondary | ICD-10-CM

## 2017-12-23 ENCOUNTER — Other Ambulatory Visit: Payer: Self-pay | Admitting: *Deleted

## 2017-12-23 NOTE — Telephone Encounter (Signed)
Received refill request for esomeprazole 40mg  . Will forward to provider.

## 2017-12-24 ENCOUNTER — Telehealth: Payer: Self-pay | Admitting: Advanced Practice Midwife

## 2017-12-24 ENCOUNTER — Encounter: Payer: Self-pay | Admitting: Family Medicine

## 2017-12-24 ENCOUNTER — Other Ambulatory Visit: Payer: Self-pay | Admitting: Neurological Surgery

## 2017-12-24 ENCOUNTER — Ambulatory Visit: Payer: Medicaid Other | Admitting: Advanced Practice Midwife

## 2017-12-24 DIAGNOSIS — M47812 Spondylosis without myelopathy or radiculopathy, cervical region: Secondary | ICD-10-CM

## 2017-12-24 NOTE — Telephone Encounter (Signed)
Patient was returning a call she had received earlier today about her missed postpartum visit. She stated she would walk in tomorrow after 4:00 sometime.

## 2017-12-24 NOTE — Telephone Encounter (Signed)
Called and left VM for patient in regards to receiving a message through the answering service about missed appt. Left office number to give [place] a call back to get rescheduled.

## 2017-12-26 MED ORDER — ESOMEPRAZOLE MAGNESIUM 40 MG PO CPDR: 40.0000 mg | DELAYED_RELEASE_CAPSULE | Freq: Every day | ORAL | 0 refills | Status: DC

## 2017-12-29 ENCOUNTER — Other Ambulatory Visit: Payer: Medicaid Other

## 2018-01-01 ENCOUNTER — Ambulatory Visit
Admission: RE | Admit: 2018-01-01 | Discharge: 2018-01-01 | Disposition: A | Discharge status: Home / Self Care | Payer: Medicaid Other | Source: Ambulatory Visit | Attending: Neurological Surgery | Admitting: Neurological Surgery

## 2018-01-01 DIAGNOSIS — M47812 Spondylosis without myelopathy or radiculopathy, cervical region: Secondary | ICD-10-CM

## 2018-01-10 ENCOUNTER — Encounter (HOSPITAL_COMMUNITY): Payer: Self-pay | Admitting: Emergency Medicine

## 2018-01-10 ENCOUNTER — Other Ambulatory Visit: Payer: Self-pay

## 2018-01-10 ENCOUNTER — Inpatient Hospital Stay (HOSPITAL_COMMUNITY)
Admission: AD | Admit: 2018-01-10 | Discharge: 2018-01-11 | Disposition: A | Discharge status: Home / Self Care | Payer: Medicaid Other | Source: Home / Self Care | Attending: Obstetrics and Gynecology | Admitting: Obstetrics and Gynecology

## 2018-01-10 ENCOUNTER — Emergency Department (HOSPITAL_COMMUNITY)
Admission: EM | Admit: 2018-01-10 | Discharge: 2018-01-10 | Disposition: A | Discharge status: Home / Self Care | Payer: Medicaid Other | Attending: Emergency Medicine | Admitting: Emergency Medicine

## 2018-01-10 DIAGNOSIS — J45909 Unspecified asthma, uncomplicated: Secondary | ICD-10-CM

## 2018-01-10 DIAGNOSIS — F17210 Nicotine dependence, cigarettes, uncomplicated: Secondary | ICD-10-CM

## 2018-01-10 DIAGNOSIS — R51 Headache: Secondary | ICD-10-CM | POA: Diagnosis not present

## 2018-01-10 DIAGNOSIS — F4310 Post-traumatic stress disorder, unspecified: Secondary | ICD-10-CM | POA: Insufficient documentation

## 2018-01-10 DIAGNOSIS — N939 Abnormal uterine and vaginal bleeding, unspecified: Secondary | ICD-10-CM

## 2018-01-10 DIAGNOSIS — N189 Chronic kidney disease, unspecified: Secondary | ICD-10-CM

## 2018-01-10 DIAGNOSIS — Z5321 Procedure and treatment not carried out due to patient leaving prior to being seen by health care provider: Secondary | ICD-10-CM | POA: Insufficient documentation

## 2018-01-10 DIAGNOSIS — M1990 Unspecified osteoarthritis, unspecified site: Secondary | ICD-10-CM | POA: Insufficient documentation

## 2018-01-10 DIAGNOSIS — E669 Obesity, unspecified: Secondary | ICD-10-CM | POA: Insufficient documentation

## 2018-01-10 DIAGNOSIS — M549 Dorsalgia, unspecified: Secondary | ICD-10-CM | POA: Insufficient documentation

## 2018-01-10 DIAGNOSIS — M419 Scoliosis, unspecified: Secondary | ICD-10-CM | POA: Insufficient documentation

## 2018-01-10 DIAGNOSIS — R519 Headache, unspecified: Secondary | ICD-10-CM

## 2018-01-10 DIAGNOSIS — Z8541 Personal history of malignant neoplasm of cervix uteri: Secondary | ICD-10-CM | POA: Insufficient documentation

## 2018-01-10 DIAGNOSIS — E7800 Pure hypercholesterolemia, unspecified: Secondary | ICD-10-CM

## 2018-01-10 DIAGNOSIS — G43909 Migraine, unspecified, not intractable, without status migrainosus: Secondary | ICD-10-CM

## 2018-01-10 DIAGNOSIS — K219 Gastro-esophageal reflux disease without esophagitis: Secondary | ICD-10-CM | POA: Insufficient documentation

## 2018-01-10 DIAGNOSIS — F319 Bipolar disorder, unspecified: Secondary | ICD-10-CM | POA: Insufficient documentation

## 2018-01-10 NOTE — ED Triage Notes (Addendum)
C/o headache between eyes since this morning.  States she woke up at 4am with a nosebleed and then had a nosebleed 2 hours ago.  Reports mild nausea and light sensitivity.  Seen by neurologist yesterday due numbness to bilateral hands.  Took hydrocodone at 4pm.  No arm drift.  Denies dizziness.

## 2018-01-10 NOTE — ED Notes (Signed)
Pt notified nurse first about wanting to leave. Pt reports she is not going to wait this long. Arm band cut off.

## 2018-01-11 ENCOUNTER — Inpatient Hospital Stay (HOSPITAL_COMMUNITY): Payer: Medicaid Other

## 2018-01-11 ENCOUNTER — Encounter (HOSPITAL_COMMUNITY): Payer: Self-pay | Admitting: *Deleted

## 2018-01-11 LAB — WET PREP, GENITAL
SPERM: NONE SEEN
TRICH WET PREP: NONE SEEN
YEAST WET PREP: NONE SEEN

## 2018-01-11 LAB — PROTIME-INR
INR: 1
PROTHROMBIN TIME: 13.1 s (ref 11.4–15.2)

## 2018-01-11 LAB — CBC
HCT: 31.8 % — ABNORMAL LOW (ref 36.0–46.0)
Hemoglobin: 10.6 g/dL — ABNORMAL LOW (ref 12.0–15.0)
MCH: 27 pg (ref 26.0–34.0)
MCHC: 33.3 g/dL (ref 30.0–36.0)
MCV: 80.9 fL (ref 80.0–100.0)
NRBC: 0 % (ref 0.0–0.2)
PLATELETS: 318 10*3/uL (ref 150–400)
RBC: 3.93 MIL/uL (ref 3.87–5.11)
RDW: 15.5 % (ref 11.5–15.5)
WBC: 12.5 10*3/uL — AB (ref 4.0–10.5)

## 2018-01-11 LAB — URINALYSIS, ROUTINE W REFLEX MICROSCOPIC
Bilirubin Urine: NEGATIVE
Glucose, UA: NEGATIVE mg/dL
KETONES UR: NEGATIVE mg/dL
Leukocytes, UA: NEGATIVE
Nitrite: NEGATIVE
PROTEIN: NEGATIVE mg/dL
Specific Gravity, Urine: 1.021 (ref 1.005–1.030)
pH: 6 (ref 5.0–8.0)

## 2018-01-11 MED ORDER — DEXAMETHASONE SODIUM PHOSPHATE 10 MG/ML IJ SOLN
10.0000 mg | Freq: Once | INTRAMUSCULAR | Status: AC
  Administered 2018-01-11: 10 mg via INTRAVENOUS
  Filled 2018-01-11: qty 1

## 2018-01-11 MED ORDER — KETOROLAC TROMETHAMINE 30 MG/ML IJ SOLN
30.0000 mg | Freq: Once | INTRAMUSCULAR | Status: AC
  Administered 2018-01-11: 30 mg via INTRAVENOUS
  Filled 2018-01-11: qty 1

## 2018-01-11 MED ORDER — METOCLOPRAMIDE HCL 5 MG/ML IJ SOLN
10.0000 mg | Freq: Once | INTRAMUSCULAR | Status: AC
  Administered 2018-01-11: 10 mg via INTRAVENOUS
  Filled 2018-01-11: qty 2

## 2018-01-11 MED ORDER — LACTATED RINGERS IV BOLUS
1000.0000 mL | Freq: Once | INTRAVENOUS | Status: AC
  Administered 2018-01-11: 1000 mL via INTRAVENOUS

## 2018-01-11 MED ORDER — DIPHENHYDRAMINE HCL 25 MG PO CAPS
25.0000 mg | ORAL_CAPSULE | Freq: Once | ORAL | Status: AC
  Administered 2018-01-11: 25 mg via ORAL
  Filled 2018-01-11: qty 1

## 2018-01-11 NOTE — MAU Note (Addendum)
SVD 11/16/17. Have continued to have bleeding since then. Had BTL. Bleeding starts light in morning and then gets heavier. Have bad headache and lower back pain. Took Hydrocodone this am and 1600 and not helping. Use 4-5 pads daily when bleeding is heavier and sometimes only see blood when I wipe. Nose bleed this am and again this evening

## 2018-01-11 NOTE — MAU Provider Note (Signed)
History     CSN: [id number]  Arrival date and time: 01/10/18 2347   First Provider Initiated Contact with Patient 01/11/18 0153      Chief Complaint  Patient presents with  . Vaginal Bleeding  . Back Pain  . Headache   HPI   [name] is a 32 y.o. [id number] female 7 weeks PP from SVD and PP BTL who presents to MAU with complaint of headache and persistent vaginal bleeding since delivery.   Reports she has a history of migraines. She has tried taking Hydrocodone x2 left over from a dental procedure today without any relief in her headache.   Since her vaginal delivery, she has had bleeding daily. It has never stopped for more than a few hours at a time. Bleeding seems to be worst in the morning. Reports she soaks through a pad in about 1-2 hours. She is not passing any clots. Bleeding is sometimes bright red and sometimes dark brown. She has had intercourse twice since delivery. The last time there was a lot of bleeding involved and she reports they stopped having sex due to the blood, this was about 2 weeks ago. Denies abdominal pain or cramping. Denies vaginal discharge. Denies fevers.    OB History    Gravida  6   Para  4   Term  3   Preterm  1   AB  2   Living  4     SAB  2   TAB      Ectopic      Multiple  0   Live Births  4           Past Medical History:  Diagnosis Date  . Abortion in first trimester 08/2016  . Anemia   . Anxiety    Panic attack  . Arthritis   . Asthma   . Bipolar 1 disorder ([place])   . Cancer ([place])   . Cervical cancer ([place])   . Chronic kidney disease    history of kidney shut down  no current problems  . Constipation   . DDD (degenerative disc disease), cervical   . Edema   . Gallstones   . GERD (gastroesophageal reflux disease)   . High cholesterol   . History of anemia   . History of renal failure   . HPV (human papilloma virus) anogenital infection   . Leukocytosis    going to hematologist  . Migraine    . Obesity   . Obesity   . Pelvic inflammatory disease (PID) 05-08-11   previous hx. .Hx. childbirth x3-NVD  . PID (pelvic inflammatory disease)   . PTSD (post-traumatic stress disorder)   . Sciatica   . Scoliosis     Past Surgical History:  Procedure Laterality Date  . ABDOMINAL EXPLORATION SURGERY  approx 32 years old   rlq(due to adhesions around ovaries)-appendix and ovaries remain  . ANTERIOR CERVICAL DECOMP/DISCECTOMY FUSION N/A 12/09/2012   Procedure: ANTERIOR CERVICAL DECOMPRESSION/DISCECTOMY FUSION STRUCTURAL ALLOGRAFT TRESTLE PLATE CERVICAL FIVE-SIX,SIX-SEVEN;  Surgeon: Otilio Connors, MD;  Location: [place] [facility];  Service: Neurosurgery;  Laterality: N/A;  . BACK SURGERY    . CHOLECYSTECTOMY  05/10/2011   Procedure: LAPAROSCOPIC CHOLECYSTECTOMY WITH INTRAOPERATIVE CHOLANGIOGRAM;  Surgeon: Gayland Curry, MD,FACS;  Location: [facility];  Service: General;  Laterality: N/A;  . LUMBAR LAMINECTOMY/DECOMPRESSION MICRODISCECTOMY Left 11/30/2014   Procedure: Left lumbar four-five Microdiskectomy;  Surgeon: Kristeen Miss, MD;  Location: [place] [facility];  Service: Neurosurgery;  Laterality: Left;  .  LUMBAR LAMINECTOMY/DECOMPRESSION MICRODISCECTOMY Left 08/15/2015   Procedure: Left Lumbar four-five Microdiskectomy;  Surgeon: Kristeen Miss, MD;  Location: [place] [facility];  Service: Neurosurgery;  Laterality: Left;  . LUMBAR LAMINECTOMY/DECOMPRESSION MICRODISCECTOMY Left 09/28/2016   Procedure: LEFT LUMBAR FIVE -SACRAL ONE Microdiscectomy;  Surgeon: Kristeen Miss, MD;  Location: [place];  Service: Neurosurgery;  Laterality: Left;  . TUBAL LIGATION Bilateral 11/16/2017   Procedure: POST PARTUM TUBAL LIGATION;  Surgeon: Aletha Halim, MD;  Location: [place];  Service: Gynecology;  Laterality: Bilateral;    Family History  Problem Relation Age of Onset  . Diabetes Mother   . Endometriosis Mother   . Cancer Mother        lung  . Diabetes Father   . Heart disease Father   . Prostate cancer  Maternal Grandfather   . Cancer Maternal Grandfather        prostate  . Colon polyps Maternal Grandmother   . Cirrhosis Paternal Grandfather     Social History   Tobacco Use  . Smoking status: Current Every Day Smoker    Packs/day: 1.00    Years: 12.00    Pack years: 12.00    Types: Cigarettes  . Smokeless tobacco: Never Used  . Tobacco comment: trying to quit  Substance Use Topics  . Alcohol use: Not Currently    Comment: occasionally  . Drug use: Yes    Types: Marijuana, Cocaine    Allergies:  Allergies  Allergen Reactions  . Banana Itching      Itchy throat  . Latex Itching and Rash    Red spots  . Orange Fruit [Citrus] Other (See Comments)    Mouth pain, acid reflux   . Miconazole Rash and Other (See Comments)    Paradoxical effect: increases yeast (also)    Medications Prior to Admission  Medication Sig Dispense Refill Last Dose  . cyclobenzaprine (FLEXERIL) 5 MG tablet TAKE 1 TABLET (5 MG TOTAL) BY MOUTH EVERY 8 (EIGHT) HOURS AS NEEDED FOR MUSCLE SPASMS. 30 tablet 0 Taking  . esomeprazole (NEXIUM) 40 MG capsule Take 1 capsule (40 mg total) by mouth daily at 12 noon. 30 capsule 0   . ibuprofen (ADVIL,MOTRIN) 600 MG tablet Take 1 tablet (600 mg total) by mouth every 6 (six) hours. 30 tablet 0   . pantoprazole (PROTONIX) 20 MG tablet Take 1 tablet (20 mg total) by mouth daily. (Patient not taking: Reported on 08/23/2017) 30 tablet 1 Not Taking  . Prenatal Multivit-Min-Fe-FA (PRENATAL VITAMINS) 0.8 MG tablet Take 1 tablet by mouth daily. 30 tablet 12 11/15/2017 at Unknown time  . ranitidine (ZANTAC) 150 MG tablet TAKE 2 TABLETS (300 MG TOTAL) BY MOUTH 2 (TWO) TIMES DAILY. 120 tablet 2     Review of Systems  Constitutional: Negative for chills and fever.  HENT: Negative for congestion.   Respiratory: Negative for shortness of breath.   Cardiovascular: Negative for chest pain and leg swelling.  Gastrointestinal: Negative for abdominal distention, abdominal pain,  blood in stool, constipation, diarrhea, nausea and vomiting.  Genitourinary: Positive for vaginal bleeding. Negative for dysuria, pelvic pain and vaginal discharge.  Neurological: Negative for dizziness and light-headedness.   Physical Exam   Blood pressure 121/69, pulse 75, temperature 98.6 F (37 C), resp. rate 18, height 5\' 3"  (1.6 m), weight 105.7 kg, not currently breastfeeding.  Physical Exam  Constitutional: She appears well-developed and well-nourished. No distress.  HENT:  Head: Normocephalic and atraumatic.  Eyes: Conjunctivae and EOM are normal.  Neck: Normal range  of motion. Neck supple.  Cardiovascular: Normal rate, regular rhythm and normal heart sounds.  No murmur heard. Respiratory: Effort normal. No respiratory distress.  GI: Soft. Bowel sounds are normal. She exhibits no distension. There is no tenderness. There is no rebound and no guarding.  Genitourinary: Vagina normal and uterus normal. Uterus is not tender. Cervix exhibits no motion tenderness, no discharge and no friability. Right adnexum displays no mass and no tenderness. Left adnexum displays no mass and no tenderness. No bleeding in the vagina. No vaginal discharge found.    MAU Course  Procedures  MDM Pelvic exam performed and unremarkable. No vaginal or cervical lesions that would account for persistent bleeding. Cultures sent. Obtained transvaginal sono to evaluate for retained placental fragments. CBC and PT/INR obtained.   Treated headache with LR and migraine cocktail (toradol, decadron, reglan and benadryl). Headache resolved with treatment.   Assessment and Plan   1. Vaginal bleeding   2. Acute nonintractable headache, unspecified headache type    Discussed with patient that lochia can persist for up to 8 weeks PP. Given report of such heavy bleeding, obtained transvaginal sono to evaluate for retained placental fragments. Ultrasound was negative. PT/INR normal. HgB stable. Patient's vital signs  stable. No bleeding present on exam. GC/Chlamydia pending. No signs of infection that would be etiology of bleeding. Have recommended that patient f/u with OB if still having bleeding in one week. Unable to distinguish if this is the start of a menstrual period as patient reports she has never had a day without bleeding.   Headache resolved with migraine cocktail. Patient neurologically intact and has a history of migraines. Unlikely to be related to PP Pre-Eclampsia as >6 weeks PP and patient has stable BP and no proteinuria on urine dipstick. Strict return precautions discussed.   Melina Schools 01/11/2018, 1:54 AM

## 2018-01-11 NOTE — Discharge Instructions (Signed)
Your ultrasound was normal. Sometimes women will have bleeding for up to 8 weeks after delivery. This may also be the start of your menstrual period after delivery. If your bleeding continues for longer than one week please see your Ob doctor.   Make sure to drink lots of water and to get adequate rest to prevent headaches. You can take over the counter tylenol or ibuprofen for headaches.

## 2018-01-13 LAB — GC/CHLAMYDIA PROBE AMP (~~LOC~~) NOT AT ARMC
Chlamydia: NEGATIVE
Neisseria Gonorrhea: NEGATIVE

## 2018-01-26 ENCOUNTER — Other Ambulatory Visit: Payer: Self-pay

## 2018-01-26 ENCOUNTER — Emergency Department (HOSPITAL_COMMUNITY)
Admission: EM | Admit: 2018-01-26 | Discharge: 2018-01-26 | Disposition: A | Discharge status: Home / Self Care | Payer: Medicaid Other | Attending: Emergency Medicine | Admitting: Emergency Medicine

## 2018-01-26 ENCOUNTER — Encounter (HOSPITAL_COMMUNITY): Payer: Self-pay | Admitting: Emergency Medicine

## 2018-01-26 DIAGNOSIS — J45909 Unspecified asthma, uncomplicated: Secondary | ICD-10-CM | POA: Diagnosis not present

## 2018-01-26 DIAGNOSIS — N61 Mastitis without abscess: Secondary | ICD-10-CM | POA: Diagnosis not present

## 2018-01-26 DIAGNOSIS — Z79899 Other long term (current) drug therapy: Secondary | ICD-10-CM | POA: Diagnosis not present

## 2018-01-26 DIAGNOSIS — E7800 Pure hypercholesterolemia, unspecified: Secondary | ICD-10-CM | POA: Insufficient documentation

## 2018-01-26 DIAGNOSIS — J0120 Acute ethmoidal sinusitis, unspecified: Secondary | ICD-10-CM | POA: Insufficient documentation

## 2018-01-26 DIAGNOSIS — F17210 Nicotine dependence, cigarettes, uncomplicated: Secondary | ICD-10-CM | POA: Insufficient documentation

## 2018-01-26 DIAGNOSIS — N644 Mastodynia: Secondary | ICD-10-CM | POA: Diagnosis present

## 2018-01-26 DIAGNOSIS — Z91040 Latex allergy status: Secondary | ICD-10-CM | POA: Insufficient documentation

## 2018-01-26 MED ORDER — OXYCODONE-ACETAMINOPHEN 5-325 MG PO TABS
1.0000 | ORAL_TABLET | Freq: Once | ORAL | Status: AC
  Administered 2018-01-26: 1 via ORAL
  Filled 2018-01-26: qty 1

## 2018-01-26 MED ORDER — IBUPROFEN 400 MG PO TABS
600.0000 mg | ORAL_TABLET | Freq: Once | ORAL | Status: AC
  Administered 2018-01-26: 600 mg via ORAL
  Filled 2018-01-26: qty 1

## 2018-01-26 MED ORDER — GUAIFENESIN ER 1200 MG PO TB12: 1.0000 | ORAL_TABLET | Freq: Two times a day (BID) | ORAL | 0 refills | Status: DC

## 2018-01-26 MED ORDER — HYDROCODONE-ACETAMINOPHEN 5-325 MG PO TABS: 1.0000 | ORAL_TABLET | Freq: Four times a day (QID) | ORAL | 0 refills | Status: DC | PRN

## 2018-01-26 MED ORDER — ACETAMINOPHEN 325 MG PO TABS
650.0000 mg | ORAL_TABLET | Freq: Once | ORAL | Status: AC
  Administered 2018-01-26: 650 mg via ORAL
  Filled 2018-01-26: qty 2

## 2018-01-26 MED ORDER — AMOXICILLIN-POT CLAVULANATE 875-125 MG PO TABS: 1.0000 | ORAL_TABLET | Freq: Two times a day (BID) | ORAL | 0 refills | Status: DC

## 2018-01-26 MED ORDER — FLUCONAZOLE 150 MG PO TABS
150.0000 mg | ORAL_TABLET | Freq: Once | ORAL | Status: AC
  Administered 2018-01-26: 150 mg via ORAL
  Filled 2018-01-26: qty 1

## 2018-01-26 NOTE — Discharge Instructions (Addendum)
Return here as needed.  You will need to see your GYN doctor for further evaluation.  Use warm compresses over the area as well.

## 2018-01-26 NOTE — ED Notes (Signed)
Patient to be discharged once diflucan arrives from pharmacy.  Instructions provided.  Reports she is not driving but has a ride home.

## 2018-01-26 NOTE — ED Triage Notes (Signed)
C/o sinus pressure and nasal congestion x 1 week.  Finished PCN 1 week ago for root canal.  Currently taking antibiotic for mastitis.  States R breast hasn't improved after 3-4 days of antibiotics.  Also reports vaginal itching since taking antibiotics.

## 2018-01-27 NOTE — ED Provider Notes (Signed)
[facility] EMERGENCY DEPARTMENT Provider Note   CSN: [id number] Arrival date & time: 01/26/18  1528     History   Chief Complaint Chief Complaint  Patient presents with  . Facial Pain  . Nasal Congestion  . Breast Pain  . Vaginal Itching    HPI [name] is a 32 y.o. female.  HPI Patient presents to the emergency department with right breast pain around the nipple.  The patient states that she was breast-feeding up until about a month ago.  The patient states that this started 2 weeks ago.  The patient states that she was given amoxicillin for this by her doctor.  The patient states that they started her on Bactrim as well.  The patient states that she is also had some nasal congestion and sinus pressure over the last 5 days.  Patient states that she is condition better or worse.  Patient states that she did not take any other medications prior to arrival.  The patient denies chest pain, shortness of breath, headache,blurred vision, neck pain, fever, cough, weakness, numbness, dizziness, anorexia, edema, abdominal pain, nausea, vomiting, diarrhea, rash, back pain, dysuria, hematemesis, bloody stool, near syncope, or syncope. Past Medical History:  Diagnosis Date  . Abortion in first trimester 08/2016  . Anemia   . Anxiety    Panic attack  . Arthritis   . Asthma   . Bipolar 1 disorder ([place])   . Cancer ([place])   . Cervical cancer ([place])   . Chronic kidney disease    history of kidney shut down  no current problems  . Constipation   . DDD (degenerative disc disease), cervical   . Edema   . Gallstones   . GERD (gastroesophageal reflux disease)   . High cholesterol   . History of anemia   . History of renal failure   . HPV (human papilloma virus) anogenital infection   . Leukocytosis    going to hematologist  . Migraine   . Obesity   . Obesity   . Pelvic inflammatory disease (PID) 05-08-11   previous hx. .Hx. childbirth x3-NVD  . PID (pelvic  inflammatory disease)   . PTSD (post-traumatic stress disorder)   . Sciatica   . Scoliosis     Patient Active Problem List   Diagnosis Date Noted  . Vaginal delivery 11/16/2017  . Anemia in pregnancy 08/30/2017  . Unwanted fertility 08/23/2017  . Obesity affecting pregnancy, antepartum 06/19/2017  . Supervision of high risk pregnancy, antepartum, second trimester 05/29/2017  . History of preterm delivery, currently pregnant in second trimester 05/29/2017  . Tobacco use in pregnancy, antepartum, second trimester 05/29/2017  . Drug use affecting pregnancy in second trimester 05/29/2017  . History of premature delivery 08/10/2016  . Bipolar disorder ([place]) 08/07/2016  . Herniated nucleus pulposus, L4-5 left 11/30/2014  . Chronic pain syndrome 10/28/2012  . Neck pain 10/28/2012  . Back pain 10/28/2012  . Osteoarthritis 10/28/2012  . Migraine   . Asthma     Past Surgical History:  Procedure Laterality Date  . ABDOMINAL EXPLORATION SURGERY  approx 32 years old   rlq(due to adhesions around ovaries)-appendix and ovaries remain  . ANTERIOR CERVICAL DECOMP/DISCECTOMY FUSION N/A 12/09/2012   Procedure: ANTERIOR CERVICAL DECOMPRESSION/DISCECTOMY FUSION STRUCTURAL ALLOGRAFT TRESTLE PLATE CERVICAL FIVE-SIX,SIX-SEVEN;  Surgeon: Otilio Connors, MD;  Location: [facility];  Service: Neurosurgery;  Laterality: N/A;  . BACK SURGERY    . CHOLECYSTECTOMY  05/10/2011   Procedure: LAPAROSCOPIC CHOLECYSTECTOMY WITH INTRAOPERATIVE  CHOLANGIOGRAM;  Surgeon: Gayland Curry, MD,FACS;  Location: [facility];  Service: General;  Laterality: N/A;  . LUMBAR LAMINECTOMY/DECOMPRESSION MICRODISCECTOMY Left 11/30/2014   Procedure: Left lumbar four-five Microdiskectomy;  Surgeon: Kristeen Miss, MD;  Location: [facility];  Service: Neurosurgery;  Laterality: Left;  . LUMBAR LAMINECTOMY/DECOMPRESSION MICRODISCECTOMY Left 08/15/2015   Procedure: Left Lumbar four-five Microdiskectomy;  Surgeon: Kristeen Miss, MD;  Location: [facility]  [facility]UR[facility];  Service: Neurosurgery;  Laterality: Left;  . LUMBAR LAMINECTOMY/DECOMPRESSION MICRODISCECTOMY Left 09/28/2016   Procedure: LEFT LUMBAR FIVE -SACRAL ONE Microdiscectomy;  Surgeon: Kristeen Miss, MD;  Location: [facility];  Service: Neurosurgery;  Laterality: Left;  . TUBAL LIGATION Bilateral 11/16/2017   Procedure: POST PARTUM TUBAL LIGATION;  Surgeon: Aletha Halim, MD;  Location: [facility];  Service: Gynecology;  Laterality: Bilateral;     OB History    Gravida  6   Para  4   Term  3   Preterm  1   AB  2   Living  4     SAB  2   TAB      Ectopic      Multiple  0   Live Births  4            Home Medications    Prior to Admission medications   Medication Sig Start Date End Date Taking? Authorizing Provider  amLODipine (NORVASC) 5 MG tablet Take 5 mg by mouth daily. 01/20/18  Yes [provider]  cetirizine (ZYRTEC) 10 MG tablet Take 10 mg by mouth daily. 11/12/17  Yes [provider]  FLUoxetine (PROZAC) 20 MG capsule Take 20 mg by mouth daily. 12/20/17  Yes [provider]  ibuprofen (ADVIL,MOTRIN) 800 MG tablet Take 800 mg by mouth 3 (three) times daily. 01/20/18  Yes [provider]  sulfamethoxazole-trimethoprim (BACTRIM DS,SEPTRA DS) 800-160 MG tablet Take 1 tablet by mouth 2 (two) times daily. 01/20/18  Yes [provider]  amoxicillin-clavulanate (AUGMENTIN) 875-125 MG tablet Take 1 tablet by mouth 2 (two) times daily. 01/26/18   Avyanna Spada, Harrell Gave, PA-C  cyclobenzaprine (FLEXERIL) 5 MG tablet TAKE 1 TABLET (5 MG TOTAL) BY MOUTH EVERY 8 (EIGHT) HOURS AS NEEDED FOR MUSCLE SPASMS. Patient not taking: Reported on 01/26/2018 11/09/17   Donnamae Jude, MD  esomeprazole (NEXIUM) 40 MG capsule Take 1 capsule (40 mg total) by mouth daily at 12 noon. Patient not taking: Reported on 01/26/2018 12/26/17   Aletha Halim, MD  Guaifenesin 1200 MG TB12 Take 1 tablet (1,200 mg total) by mouth 2 (two) times daily.  01/26/18   Jadee Golebiewski, Harrell Gave, PA-C  HYDROcodone-acetaminophen (NORCO/VICODIN) 5-325 MG tablet Take 1 tablet by mouth every 6 (six) hours as needed for moderate pain. 01/26/18   Aralynn Brake, Harrell Gave, PA-C  ibuprofen (ADVIL,MOTRIN) 600 MG tablet Take 1 tablet (600 mg total) by mouth every 6 (six) hours. Patient not taking: Reported on 01/26/2018 11/17/17   Keitha Butte, CNM  Prenatal Multivit-Min-Fe-FA (PRENATAL VITAMINS) 0.8 MG tablet Take 1 tablet by mouth daily. Patient not taking: Reported on 01/26/2018 05/29/17   Luvenia Redden, PA-C  ranitidine (ZANTAC) 150 MG tablet TAKE 2 TABLETS (300 MG TOTAL) BY MOUTH 2 (TWO) TIMES DAILY. Patient not taking: Reported on 01/26/2018 12/10/17   Woodroe Mode, MD    Family History Family History  Problem Relation Age of Onset  . Diabetes Mother   . Endometriosis Mother   . Cancer Mother        lung  . Diabetes Father   .  Heart disease Father   . Prostate cancer Maternal Grandfather   . Cancer Maternal Grandfather        prostate  . Colon polyps Maternal Grandmother   . Cirrhosis Paternal Grandfather     Social History Social History   Tobacco Use  . Smoking status: Current Every Day Smoker    Packs/day: 1.00    Years: 12.00    Pack years: 12.00    Types: Cigarettes  . Smokeless tobacco: Never Used  . Tobacco comment: trying to quit  Substance Use Topics  . Alcohol use: Not Currently    Comment: occasionally  . Drug use: Yes    Types: Marijuana, Cocaine     Allergies   Banana; Latex; Orange fruit [citrus]; and Miconazole   Review of Systems Review of Systems All other systems negative except as documented in the HPI. All pertinent positives and negatives as reviewed in the HPI.  Physical Exam Updated Vital Signs BP 108/66 (BP Location: Right Arm)   Pulse 70   Temp 98.5 F (36.9 C) (Oral)   Resp 17   Ht 5\' 3"  (1.6 m)   Wt 105.7 kg   LMP  (LMP Unknown)   SpO2 100%   BMI 41.27 kg/m   Physical Exam  Constitutional: She  is oriented to person, place, and time. She appears well-developed and well-nourished. No distress.  HENT:  Head: Normocephalic and atraumatic.  Mouth/Throat: Oropharynx is clear and moist.  Eyes: Pupils are equal, round, and reactive to light.  Neck: Normal range of motion. Neck supple.  Cardiovascular: Normal rate, regular rhythm and normal heart sounds. Exam reveals no gallop and no friction rub.  No murmur heard. Pulmonary/Chest: Effort normal and breath sounds normal. No respiratory distress. She has no wheezes. Right breast exhibits tenderness. Right breast exhibits no inverted nipple, no mass, no nipple discharge and no skin change. There is breast swelling and tenderness.    Abdominal: Soft. Bowel sounds are normal. She exhibits no distension. There is no tenderness.  Neurological: She is alert and oriented to person, place, and time. She exhibits normal muscle tone. Coordination normal.  Skin: Skin is warm and dry. Capillary refill takes less than 2 seconds. No rash noted. No erythema.  Psychiatric: She has a normal mood and affect. Her behavior is normal.  Nursing note and vitals reviewed.    ED Treatments / Results  Labs (all labs ordered are listed, but only abnormal results are displayed) Labs Reviewed - No data to display  EKG None  Radiology No results found.  Procedures Procedures (including critical care time)  Medications Ordered in ED Medications  acetaminophen (TYLENOL) tablet 650 mg (650 mg Oral Given 01/26/18 1724)  oxyCODONE-acetaminophen (PERCOCET/ROXICET) 5-325 MG per tablet 1 tablet (1 tablet Oral Given 01/26/18 1920)  ibuprofen (ADVIL,MOTRIN) tablet 600 mg (600 mg Oral Given 01/26/18 1920)  fluconazole (DIFLUCAN) tablet 150 mg (150 mg Oral Given 01/26/18 1954)     Initial Impression / Assessment and Plan / ED Course  I have reviewed the triage vital signs and the nursing notes.  Pertinent labs & imaging results that were available during my care of  the patient were reviewed by me and considered in my medical decision making (see chart for details).     The patient has mastitis based on her symptoms.  The patient will be started on medications for this.  Told her to use warm compresses over the area.  I advised her to follow-up with her GYN doctor.  The patient agrees the plan and all questions were answered.  We will also treat for an upper respiratory/sinus infection.  Final Clinical Impressions(s) / ED Diagnoses   Final diagnoses:  Mastitis  Acute non-recurrent ethmoidal sinusitis    ED Discharge Orders         Ordered    HYDROcodone-acetaminophen (NORCO/VICODIN) 5-325 MG tablet  Every 6 hours PRN     01/26/18 1920    Guaifenesin 1200 MG TB12  2 times daily     01/26/18 1920    amoxicillin-clavulanate (AUGMENTIN) 875-125 MG tablet  2 times daily     01/26/18 [address] PA-C 01/27/18 1102    Virgel Manifold, MD 01/30/18 1009

## 2018-01-29 ENCOUNTER — Ambulatory Visit
Admission: RE | Admit: 2018-01-29 | Discharge: 2018-01-29 | Disposition: A | Discharge status: Home / Self Care | Payer: Medicaid Other | Source: Ambulatory Visit | Attending: Nurse Practitioner | Admitting: Nurse Practitioner

## 2018-01-29 ENCOUNTER — Other Ambulatory Visit: Payer: Self-pay | Admitting: Nurse Practitioner

## 2018-01-29 DIAGNOSIS — N61 Mastitis without abscess: Secondary | ICD-10-CM

## 2018-01-31 ENCOUNTER — Other Ambulatory Visit: Payer: Medicaid Other

## 2018-01-31 ENCOUNTER — Other Ambulatory Visit: Payer: Self-pay | Admitting: Nurse Practitioner

## 2018-01-31 DIAGNOSIS — N61 Mastitis without abscess: Secondary | ICD-10-CM

## 2018-02-04 LAB — AEROBIC/ANAEROBIC CULTURE W GRAM STAIN (SURGICAL/DEEP WOUND)

## 2018-02-04 LAB — AEROBIC/ANAEROBIC CULTURE (SURGICAL/DEEP WOUND)

## 2018-02-05 ENCOUNTER — Other Ambulatory Visit: Payer: Medicaid Other

## 2018-02-11 ENCOUNTER — Ambulatory Visit
Admission: RE | Admit: 2018-02-11 | Discharge: 2018-02-11 | Disposition: A | Discharge status: Home / Self Care | Payer: Medicaid Other | Source: Ambulatory Visit | Attending: Nurse Practitioner | Admitting: Nurse Practitioner

## 2018-02-11 ENCOUNTER — Other Ambulatory Visit: Payer: Self-pay | Admitting: Nurse Practitioner

## 2018-02-11 DIAGNOSIS — N61 Mastitis without abscess: Secondary | ICD-10-CM

## 2018-02-18 ENCOUNTER — Ambulatory Visit: Payer: Medicaid Other | Attending: Neurological Surgery

## 2018-03-12 ENCOUNTER — Other Ambulatory Visit: Payer: Medicaid Other

## 2018-04-02 ENCOUNTER — Other Ambulatory Visit: Payer: Self-pay | Admitting: Obstetrics & Gynecology

## 2018-04-02 DIAGNOSIS — O0992 Supervision of high risk pregnancy, unspecified, second trimester: Secondary | ICD-10-CM

## 2018-04-02 DIAGNOSIS — O99332 Smoking (tobacco) complicating pregnancy, second trimester: Secondary | ICD-10-CM

## 2018-07-05 ENCOUNTER — Other Ambulatory Visit: Payer: Self-pay | Admitting: Obstetrics & Gynecology

## 2018-07-05 DIAGNOSIS — O0992 Supervision of high risk pregnancy, unspecified, second trimester: Secondary | ICD-10-CM

## 2018-07-05 DIAGNOSIS — O99332 Smoking (tobacco) complicating pregnancy, second trimester: Secondary | ICD-10-CM

## 2018-12-30 ENCOUNTER — Other Ambulatory Visit: Payer: Self-pay | Admitting: *Deleted

## 2018-12-30 NOTE — Telephone Encounter (Signed)
Received  A refill request from [facility] for Nexium. Will route to provider. Last seen in office 11/29/17 Linda,RN

## 2018-12-31 MED ORDER — ESOMEPRAZOLE MAGNESIUM 40 MG PO CPDR: 40.0000 mg | DELAYED_RELEASE_CAPSULE | Freq: Every day | ORAL | 0 refills | Status: DC

## 2019-05-05 ENCOUNTER — Encounter: Payer: Self-pay | Admitting: *Deleted

## 2019-08-03 ENCOUNTER — Other Ambulatory Visit: Payer: Self-pay

## 2019-08-03 ENCOUNTER — Other Ambulatory Visit (HOSPITAL_COMMUNITY)
Admission: RE | Admit: 2019-08-03 | Discharge: 2019-08-03 | Disposition: A | Discharge status: Home / Self Care | Payer: Medicare Other | Source: Ambulatory Visit | Attending: Family Medicine | Admitting: Family Medicine

## 2019-08-03 ENCOUNTER — Ambulatory Visit (INDEPENDENT_AMBULATORY_CARE_PROVIDER_SITE_OTHER): Payer: Medicare Other

## 2019-08-03 DIAGNOSIS — N898 Other specified noninflammatory disorders of vagina: Secondary | ICD-10-CM

## 2019-08-03 NOTE — Progress Notes (Signed)
Agree with A & P. 

## 2019-08-03 NOTE — Progress Notes (Signed)
Pt reports that she feels that she has BV.  Pt explained how to obtain a self swab.  Pt informed that it will take approximately 48 hrs for results in which we will call with abnormal results.  Pt states that she is having some pelvic pain issues that she has been experiencing since she has had her tubes tied.  I advised pt to schedule an appt with a provider so that she can be evaluated.  Pt verbalized understanding with no further questions.   Mel Almond, RN  08/03/19

## 2019-08-04 LAB — CERVICOVAGINAL ANCILLARY ONLY
Bacterial Vaginitis (gardnerella): NEGATIVE
Candida Glabrata: NEGATIVE
Candida Vaginitis: NEGATIVE
Chlamydia: NEGATIVE
Comment: NEGATIVE
Comment: NEGATIVE
Comment: NEGATIVE
Comment: NEGATIVE
Comment: NEGATIVE
Comment: NORMAL
Neisseria Gonorrhea: NEGATIVE
Trichomonas: NEGATIVE

## 2019-08-12 ENCOUNTER — Encounter (HOSPITAL_COMMUNITY): Payer: Self-pay | Admitting: Emergency Medicine

## 2019-08-12 ENCOUNTER — Emergency Department (HOSPITAL_COMMUNITY)
Admission: EM | Admit: 2019-08-12 | Discharge: 2019-08-12 | Disposition: A | Discharge status: Home / Self Care | Payer: Medicare Other | Attending: Emergency Medicine | Admitting: Emergency Medicine

## 2019-08-12 ENCOUNTER — Other Ambulatory Visit: Payer: Self-pay

## 2019-08-12 DIAGNOSIS — Z79899 Other long term (current) drug therapy: Secondary | ICD-10-CM | POA: Insufficient documentation

## 2019-08-12 DIAGNOSIS — M545 Low back pain, unspecified: Secondary | ICD-10-CM

## 2019-08-12 DIAGNOSIS — R12 Heartburn: Secondary | ICD-10-CM

## 2019-08-12 DIAGNOSIS — Z8541 Personal history of malignant neoplasm of cervix uteri: Secondary | ICD-10-CM | POA: Insufficient documentation

## 2019-08-12 DIAGNOSIS — B372 Candidiasis of skin and nail: Secondary | ICD-10-CM | POA: Diagnosis not present

## 2019-08-12 DIAGNOSIS — F17210 Nicotine dependence, cigarettes, uncomplicated: Secondary | ICD-10-CM | POA: Insufficient documentation

## 2019-08-12 LAB — URINALYSIS, ROUTINE W REFLEX MICROSCOPIC
Bacteria, UA: NONE SEEN
Bilirubin Urine: NEGATIVE
Glucose, UA: NEGATIVE mg/dL
Ketones, ur: NEGATIVE mg/dL
Leukocytes,Ua: NEGATIVE
Nitrite: NEGATIVE
Protein, ur: NEGATIVE mg/dL
Specific Gravity, Urine: 1.011 (ref 1.005–1.030)
pH: 6 (ref 5.0–8.0)

## 2019-08-12 MED ORDER — NYSTATIN 100000 UNIT/GM EX POWD: 1.0000 "application " | Freq: Three times a day (TID) | CUTANEOUS | 0 refills | Status: DC

## 2019-08-12 MED ORDER — CYCLOBENZAPRINE HCL 10 MG PO TABS: 10.0000 mg | ORAL_TABLET | Freq: Two times a day (BID) | ORAL | 0 refills | Status: DC | PRN

## 2019-08-12 MED ORDER — KETOROLAC TROMETHAMINE 60 MG/2ML IM SOLN
60.0000 mg | Freq: Once | INTRAMUSCULAR | Status: AC
  Administered 2019-08-12: 60 mg via INTRAMUSCULAR
  Filled 2019-08-12: qty 2

## 2019-08-12 MED ORDER — ALUM & MAG HYDROXIDE-SIMETH 200-200-20 MG/5ML PO SUSP
30.0000 mL | Freq: Once | ORAL | Status: AC
  Administered 2019-08-12: 30 mL via ORAL
  Filled 2019-08-12: qty 30

## 2019-08-12 MED ORDER — NAPROXEN 375 MG PO TABS: 375.0000 mg | ORAL_TABLET | Freq: Two times a day (BID) | ORAL | 0 refills | Status: DC

## 2019-08-12 NOTE — ED Triage Notes (Addendum)
Patient arrives to ED with complaints of acute on chronic back pain. Patient states she has chronic back pain and follows up with [facility]. Patient states over the last two days her back pain has gotten increasing worse after giving her grandmother a bed bath. Patient states her OTC medication is not relieving her pain. Patient denies flank or urinary problems. Her spine MD cannot see her for three weeks.

## 2019-08-12 NOTE — ED Provider Notes (Signed)
[facility] EMERGENCY DEPARTMENT Provider Note   CSN: [id number] Arrival date & time: 08/12/19  1619     History Chief Complaint  Patient presents with  . Back Pain    [name] is a 34 y.o. female.  Patient reports increase in low back pain after assisting in lifting her grandmother at home. No urinary symptoms. Previous back surgeries for disk disease. Has been trying advil and tylenol at home without relief.  Patient also reporting heartburn and yeast infection under folds of skin on abdomen.  The history is provided by the patient. No language interpreter was used.  Back Pain Location:  Lumbar spine Quality:  Shooting and cramping Radiates to:  Does not radiate Pain severity:  Severe Pain is:  Same all the time Onset quality:  Sudden Duration:  2 days Timing:  Constant Progression:  Waxing and waning Context: lifting heavy objects   Ineffective treatments:  OTC medications Associated symptoms: no abdominal pain, no bladder incontinence, no bowel incontinence, no fever, no leg pain, no numbness and no paresthesias        Past Medical History:  Diagnosis Date  . Abortion in first trimester 08/2016  . Anemia   . Anxiety    Panic attack  . Arthritis   . Asthma   . Bipolar 1 disorder ([place])   . Cancer ([place])   . Cervical cancer ([place])   . Chronic kidney disease    history of kidney shut down  no current problems  . Constipation   . DDD (degenerative disc disease), cervical   . Edema   . Gallstones   . GERD (gastroesophageal reflux disease)   . High cholesterol   . History of anemia   . History of renal failure   . HPV (human papilloma virus) anogenital infection   . Leukocytosis    going to hematologist  . Migraine   . Obesity   . Obesity   . Pelvic inflammatory disease (PID) 05-08-11   previous hx. .Hx. childbirth x3-NVD  . PID (pelvic inflammatory disease)   . PTSD (post-traumatic stress disorder)   . Sciatica   .  Scoliosis     Patient Active Problem List   Diagnosis Date Noted  . Vaginal delivery 11/16/2017  . Anemia in pregnancy 08/30/2017  . Unwanted fertility 08/23/2017  . Obesity affecting pregnancy, antepartum 06/19/2017  . Supervision of high risk pregnancy, antepartum, second trimester 05/29/2017  . History of preterm delivery, currently pregnant in second trimester 05/29/2017  . Tobacco use in pregnancy, antepartum, second trimester 05/29/2017  . Drug use affecting pregnancy in second trimester 05/29/2017  . History of premature delivery 08/10/2016  . Bipolar disorder ([place]) 08/07/2016  . Herniated nucleus pulposus, L4-5 left 11/30/2014  . Chronic pain syndrome 10/28/2012  . Neck pain 10/28/2012  . Back pain 10/28/2012  . Osteoarthritis 10/28/2012  . Migraine   . Asthma     Past Surgical History:  Procedure Laterality Date  . ABDOMINAL EXPLORATION SURGERY  approx 34 years old   rlq(due to adhesions around ovaries)-appendix and ovaries remain  . ANTERIOR CERVICAL DECOMP/DISCECTOMY FUSION N/A 12/09/2012   Procedure: ANTERIOR CERVICAL DECOMPRESSION/DISCECTOMY FUSION STRUCTURAL ALLOGRAFT TRESTLE PLATE CERVICAL FIVE-SIX,SIX-SEVEN;  Surgeon: Otilio Connors, MD;  Location: [facility];  Service: Neurosurgery;  Laterality: N/A;  . BACK SURGERY    . CHOLECYSTECTOMY  05/10/2011   Procedure: LAPAROSCOPIC CHOLECYSTECTOMY WITH INTRAOPERATIVE CHOLANGIOGRAM;  Surgeon: Gayland Curry, MD,FACS;  Location: [facility];  Service: General;  Laterality:  N/A;  . LUMBAR LAMINECTOMY/DECOMPRESSION MICRODISCECTOMY Left 11/30/2014   Procedure: Left lumbar four-five Microdiskectomy;  Surgeon: Kristeen Miss, MD;  Location: [facility];  Service: Neurosurgery;  Laterality: Left;  . LUMBAR LAMINECTOMY/DECOMPRESSION MICRODISCECTOMY Left 08/15/2015   Procedure: Left Lumbar four-five Microdiskectomy;  Surgeon: Kristeen Miss, MD;  Location: [facility];  Service: Neurosurgery;  Laterality: Left;  . LUMBAR  LAMINECTOMY/DECOMPRESSION MICRODISCECTOMY Left 09/28/2016   Procedure: LEFT LUMBAR FIVE -SACRAL ONE Microdiscectomy;  Surgeon: Kristeen Miss, MD;  Location: [facility];  Service: Neurosurgery;  Laterality: Left;  . TUBAL LIGATION Bilateral 11/16/2017   Procedure: POST PARTUM TUBAL LIGATION;  Surgeon: Aletha Halim, MD;  Location: [facility];  Service: Gynecology;  Laterality: Bilateral;     OB History    Gravida  6   Para  4   Term  3   Preterm  1   AB  2   Living  4     SAB  2   TAB      Ectopic      Multiple  0   Live Births  4           Family History  Problem Relation Age of Onset  . Diabetes Mother   . Endometriosis Mother   . Cancer Mother        lung  . Diabetes Father   . Heart disease Father   . Prostate cancer Maternal Grandfather   . Cancer Maternal Grandfather        prostate  . Colon polyps Maternal Grandmother   . Cirrhosis Paternal Grandfather     Social History   Tobacco Use  . Smoking status: Current Every Day Smoker    Packs/day: 1.00    Years: 12.00    Pack years: 12.00    Types: Cigarettes  . Smokeless tobacco: Never Used  . Tobacco comment: trying to quit  Substance Use Topics  . Alcohol use: Not Currently    Comment: occasionally  . Drug use: Yes    Types: Marijuana, Cocaine    Home Medications Prior to Admission medications   Medication Sig Start Date End Date Taking? Authorizing Provider  amLODipine (NORVASC) 5 MG tablet Take 5 mg by mouth daily. 01/20/18   [provider]  amoxicillin-clavulanate (AUGMENTIN) 875-125 MG tablet Take 1 tablet by mouth 2 (two) times daily. 01/26/18   Lawyer, Harrell Gave, PA-C  cetirizine (ZYRTEC) 10 MG tablet Take 10 mg by mouth daily. 11/12/17   [provider]  cyclobenzaprine (FLEXERIL) 5 MG tablet TAKE 1 TABLET (5 MG TOTAL) BY MOUTH EVERY 8 (EIGHT) HOURS AS NEEDED FOR MUSCLE SPASMS. Patient not taking: Reported on 01/26/2018 11/09/17   Donnamae Jude, MD  esomeprazole  (NEXIUM) 40 MG capsule Take 1 capsule (40 mg total) by mouth daily at 12 noon. 12/31/18   Aletha Halim, MD  FLUoxetine (PROZAC) 20 MG capsule Take 20 mg by mouth daily. 12/20/17   [provider]  Guaifenesin 1200 MG TB12 Take 1 tablet (1,200 mg total) by mouth 2 (two) times daily. 01/26/18   Lawyer, Harrell Gave, PA-C  HYDROcodone-acetaminophen (NORCO/VICODIN) 5-325 MG tablet Take 1 tablet by mouth every 6 (six) hours as needed for moderate pain. 01/26/18   Lawyer, Harrell Gave, PA-C  ibuprofen (ADVIL,MOTRIN) 600 MG tablet Take 1 tablet (600 mg total) by mouth every 6 (six) hours. Patient not taking: Reported on 01/26/2018 11/17/17   Keitha Butte, CNM  ibuprofen (ADVIL,MOTRIN) 800 MG tablet Take 800 mg by mouth 3 (three) times  daily. 01/20/18   [provider]  Prenatal Multivit-Min-Fe-FA (PRENATAL VITAMINS) 0.8 MG tablet Take 1 tablet by mouth daily. Patient not taking: Reported on 01/26/2018 05/29/17   Luvenia Redden, PA-C  ranitidine (ZANTAC) 150 MG tablet TAKE 2 TABLETS (300 MG TOTAL) BY MOUTH 2 (TWO) TIMES DAILY. 04/18/18   Woodroe Mode, MD  sulfamethoxazole-trimethoprim (BACTRIM DS,SEPTRA DS) 800-160 MG tablet Take 1 tablet by mouth 2 (two) times daily. 01/20/18   [provider]    Allergies    Banana, Latex, Orange fruit [citrus], and Miconazole  Review of Systems   Review of Systems  Constitutional: Negative for fever.  Gastrointestinal: Negative for abdominal pain and bowel incontinence.  Genitourinary: Negative for bladder incontinence.  Musculoskeletal: Positive for back pain.  Neurological: Negative for numbness and paresthesias.  All other systems reviewed and are negative.   Physical Exam Updated Vital Signs BP 120/87 (BP Location: Left Arm)   Pulse (!) 58   Temp 98.7 F (37.1 C) (Oral)   Resp 18   Ht 5\' 3"  (1.6 m)   Wt 110.7 kg   SpO2 99%   BMI 43.22 kg/m   Physical Exam Vitals and nursing note reviewed.  Constitutional:       Appearance: She is well-developed.  HENT:     Head: Atraumatic.  Eyes:     Conjunctiva/sclera: Conjunctivae normal.  Cardiovascular:     Rate and Rhythm: Normal rate.  Pulmonary:     Effort: Pulmonary effort is normal.  Abdominal:     Palpations: Abdomen is soft.  Musculoskeletal:     Lumbar back: Spasms and tenderness present.       Back:  Skin:    General: Skin is warm and dry.     Findings: Rash present.     Comments: Rash consistent with candidal infection noted under folds of skin on abdomen.  Neurological:     Mental Status: She is alert and oriented to person, place, and time.     Sensory: No sensory deficit.     Motor: No weakness.  Psychiatric:        Mood and Affect: Mood normal.        Behavior: Behavior normal.     ED Results / Procedures / Treatments   Labs (all labs ordered are listed, but only abnormal results are displayed) Labs Reviewed  URINALYSIS, ROUTINE W REFLEX MICROSCOPIC - Abnormal; Notable for the following components:      Result Value   Color, Urine STRAW (*)    Hgb urine dipstick SMALL (*)    All other components within normal limits    EKG None  Radiology No results found.  Procedures Procedures (including critical care time)  Medications Ordered in ED Medications  alum & mag hydroxide-simeth (MAALOX/MYLANTA) 200-200-20 MG/5ML suspension 30 mL (has no administration in time range)  ketorolac (TORADOL) injection 60 mg (60 mg Intramuscular Given 08/12/19 2225)    ED Course  I have reviewed the triage vital signs and the nursing notes.  Pertinent labs & imaging results that were available during my care of the patient were reviewed by me and considered in my medical decision making (see chart for details).    MDM Rules/Calculators/A&P                     Patient with acute on chronic back pain.  No neurological deficits and normal neuro exam.  Patient is ambulatory.  No loss of bowel or bladder control.  No  concern for cauda  equina.  No fever, night sweats, weight loss, h/o cancer, IVDA, no recent procedure to back. No urinary symptoms suggestive of UTI.  Supportive care and return precaution discussed. Appears safe for discharge at this time. Follow up as indicated in discharge paperwork.  Final Clinical Impression(s) / ED Diagnoses Final diagnoses:  Acute midline low back pain without sciatica    Rx / DC Orders ED Discharge Orders         Ordered    naproxen (NAPROSYN) 375 MG tablet  2 times daily     08/12/19 2143    cyclobenzaprine (FLEXERIL) 10 MG tablet  2 times daily PRN     08/12/19 2143           Etta Quill, NP 08/12/19 2251    Margette Fast, MD 08/13/19 [phone]

## 2019-08-12 NOTE — ED Notes (Signed)
Patient verbalizes understanding of discharge instructions. Opportunity for questioning and answers were provided. Armband removed by staff, pt discharged from ED.  

## 2019-08-21 ENCOUNTER — Encounter: Payer: Self-pay | Admitting: Family Medicine

## 2019-08-21 ENCOUNTER — Ambulatory Visit: Payer: Medicare Other | Admitting: Family Medicine

## 2019-08-21 NOTE — Progress Notes (Signed)
Patient did not keep appointment today. She may call to reschedule.  

## 2020-08-02 ENCOUNTER — Emergency Department (HOSPITAL_COMMUNITY): Payer: Medicare Other

## 2020-08-02 ENCOUNTER — Emergency Department (HOSPITAL_COMMUNITY)
Admission: EM | Admit: 2020-08-02 | Discharge: 2020-08-02 | Disposition: A | Discharge status: Home / Self Care | Payer: Medicare Other | Attending: Emergency Medicine | Admitting: Emergency Medicine

## 2020-08-02 ENCOUNTER — Encounter (HOSPITAL_COMMUNITY): Payer: Self-pay | Admitting: Emergency Medicine

## 2020-08-02 ENCOUNTER — Other Ambulatory Visit: Payer: Self-pay

## 2020-08-02 ENCOUNTER — Ambulatory Visit (HOSPITAL_COMMUNITY)
Admission: EM | Admit: 2020-08-02 | Discharge: 2020-08-02 | Disposition: A | Discharge status: Home / Self Care | Payer: Medicare Other

## 2020-08-02 ENCOUNTER — Encounter (HOSPITAL_COMMUNITY): Payer: Self-pay

## 2020-08-02 DIAGNOSIS — R112 Nausea with vomiting, unspecified: Secondary | ICD-10-CM | POA: Diagnosis not present

## 2020-08-02 DIAGNOSIS — R102 Pelvic and perineal pain: Secondary | ICD-10-CM | POA: Diagnosis not present

## 2020-08-02 DIAGNOSIS — Z8719 Personal history of other diseases of the digestive system: Secondary | ICD-10-CM | POA: Insufficient documentation

## 2020-08-02 DIAGNOSIS — Z8541 Personal history of malignant neoplasm of cervix uteri: Secondary | ICD-10-CM | POA: Insufficient documentation

## 2020-08-02 DIAGNOSIS — N189 Chronic kidney disease, unspecified: Secondary | ICD-10-CM | POA: Insufficient documentation

## 2020-08-02 DIAGNOSIS — R197 Diarrhea, unspecified: Secondary | ICD-10-CM | POA: Insufficient documentation

## 2020-08-02 DIAGNOSIS — F17210 Nicotine dependence, cigarettes, uncomplicated: Secondary | ICD-10-CM | POA: Insufficient documentation

## 2020-08-02 DIAGNOSIS — R1032 Left lower quadrant pain: Secondary | ICD-10-CM | POA: Diagnosis not present

## 2020-08-02 DIAGNOSIS — Z91040 Latex allergy status: Secondary | ICD-10-CM | POA: Diagnosis not present

## 2020-08-02 DIAGNOSIS — J45909 Unspecified asthma, uncomplicated: Secondary | ICD-10-CM | POA: Diagnosis not present

## 2020-08-02 LAB — WET PREP, GENITAL
Clue Cells Wet Prep HPF POC: NONE SEEN
Sperm: NONE SEEN
Trich, Wet Prep: NONE SEEN
Yeast Wet Prep HPF POC: NONE SEEN

## 2020-08-02 LAB — URINALYSIS, COMPLETE (UACMP) WITH MICROSCOPIC
Bilirubin Urine: NEGATIVE
Glucose, UA: NEGATIVE mg/dL
Hgb urine dipstick: NEGATIVE
Ketones, ur: NEGATIVE mg/dL
Leukocytes,Ua: NEGATIVE
Nitrite: NEGATIVE
Protein, ur: 30 mg/dL — AB
Specific Gravity, Urine: 1.03 (ref 1.005–1.030)
pH: 6 (ref 5.0–8.0)

## 2020-08-02 LAB — POCT URINALYSIS DIPSTICK, ED / UC
Glucose, UA: NEGATIVE mg/dL
Hgb urine dipstick: NEGATIVE
Leukocytes,Ua: NEGATIVE
Nitrite: NEGATIVE
Protein, ur: 30 mg/dL — AB
Specific Gravity, Urine: 1.02 (ref 1.005–1.030)
Urobilinogen, UA: 0.2 mg/dL (ref 0.0–1.0)
pH: 6.5 (ref 5.0–8.0)

## 2020-08-02 LAB — CBC WITH DIFFERENTIAL/PLATELET
Abs Immature Granulocytes: 0 10*3/uL (ref 0.00–0.07)
Basophils Absolute: 0 10*3/uL (ref 0.0–0.1)
Basophils Relative: 0 %
Eosinophils Absolute: 0.4 10*3/uL (ref 0.0–0.5)
Eosinophils Relative: 3 %
HCT: 37.5 % (ref 36.0–46.0)
Hemoglobin: 12.1 g/dL (ref 12.0–15.0)
Lymphocytes Relative: 37 %
Lymphs Abs: 5.3 10*3/uL — ABNORMAL HIGH (ref 0.7–4.0)
MCH: 24.8 pg — ABNORMAL LOW (ref 26.0–34.0)
MCHC: 32.3 g/dL (ref 30.0–36.0)
MCV: 76.8 fL — ABNORMAL LOW (ref 80.0–100.0)
Monocytes Absolute: 0.3 10*3/uL (ref 0.1–1.0)
Monocytes Relative: 2 %
Neutro Abs: 8.3 10*3/uL — ABNORMAL HIGH (ref 1.7–7.7)
Neutrophils Relative %: 58 %
Platelets: 372 10*3/uL (ref 150–400)
RBC: 4.88 MIL/uL (ref 3.87–5.11)
RDW: 18 % — ABNORMAL HIGH (ref 11.5–15.5)
WBC: 14.3 10*3/uL — ABNORMAL HIGH (ref 4.0–10.5)
nRBC: 0 % (ref 0.0–0.2)
nRBC: 0 /100 WBC

## 2020-08-02 LAB — COMPREHENSIVE METABOLIC PANEL
ALT: 28 U/L (ref 0–44)
AST: 27 U/L (ref 15–41)
Albumin: 4 g/dL (ref 3.5–5.0)
Alkaline Phosphatase: 76 U/L (ref 38–126)
Anion gap: 14 (ref 5–15)
BUN: 8 mg/dL (ref 6–20)
CO2: 20 mmol/L — ABNORMAL LOW (ref 22–32)
Calcium: 9.3 mg/dL (ref 8.9–10.3)
Chloride: 103 mmol/L (ref 98–111)
Creatinine, Ser: 1 mg/dL (ref 0.44–1.00)
GFR, Estimated: >60 mL/min (ref >60)
Glucose, Bld: 88 mg/dL (ref 70–99)
Potassium: 3.7 mmol/L (ref 3.5–5.1)
Sodium: 137 mmol/L (ref 135–145)
Total Bilirubin: 0.8 mg/dL (ref 0.3–1.2)
Total Protein: 7.6 g/dL (ref 6.5–8.1)

## 2020-08-02 LAB — POC URINE PREG, ED: Preg Test, Ur: NEGATIVE

## 2020-08-02 LAB — LIPASE, BLOOD: Lipase: 26 U/L (ref 11–51)

## 2020-08-02 LAB — PREGNANCY, URINE: Preg Test, Ur: NEGATIVE

## 2020-08-02 MED ORDER — SODIUM CHLORIDE 0.9 % IV BOLUS
1000.0000 mL | Freq: Once | INTRAVENOUS | Status: AC
  Administered 2020-08-02: 1000 mL via INTRAVENOUS

## 2020-08-02 MED ORDER — IOHEXOL 300 MG/ML  SOLN
100.0000 mL | Freq: Once | INTRAMUSCULAR | Status: AC | PRN
  Administered 2020-08-02: 100 mL via INTRAVENOUS

## 2020-08-02 MED ORDER — ALUM & MAG HYDROXIDE-SIMETH 200-200-20 MG/5ML PO SUSP
30.0000 mL | Freq: Once | ORAL | Status: AC
  Administered 2020-08-02: 30 mL via ORAL
  Filled 2020-08-02: qty 30

## 2020-08-02 MED ORDER — MORPHINE SULFATE (PF) 4 MG/ML IV SOLN
4.0000 mg | Freq: Once | INTRAVENOUS | Status: AC
  Administered 2020-08-02: 4 mg via INTRAVENOUS
  Filled 2020-08-02: qty 1

## 2020-08-02 MED ORDER — ESOMEPRAZOLE MAGNESIUM 40 MG PO CPDR: 40.0000 mg | DELAYED_RELEASE_CAPSULE | Freq: Every day | ORAL | 0 refills | Status: DC

## 2020-08-02 NOTE — Discharge Instructions (Addendum)
Today your CT scan was reassuring.  Please follow up with OB/GYN for further evaluation.  Please do not have sexual contact until you follow up with ob/gyn.   Today you received medications that may make you sleepy or impair your ability to make decisions.  For the next 24 hours please do not drive, operate heavy machinery, care for a small child with out another adult present, or perform any activities that may cause harm to you or someone else if you were to fall asleep or be impaired.

## 2020-08-02 NOTE — ED Provider Notes (Signed)
[place]    CSN: [id number] Arrival date & time: 08/02/20  1137      History   Chief Complaint Chief Complaint  Patient presents with  . Vaginal Pain    HPI Vestal Jodene Polyak is a 35 y.o. female.   Patient presents with abdominal pain, " burnt copper smell of vagina", dysuria, flank pain and pain during sex that began 3 days ago. Denies discharge, itching, hematuria, frequency, nausea, vomiting, diarrhea, constipation. LMP end of April, tubes tied. One partner.  History of cervical cancer.   Past Medical History:  Diagnosis Date  . Abortion in first trimester 08/2016  . Anemia   . Anxiety    Panic attack  . Arthritis   . Asthma   . Bipolar 1 disorder ([place])   . Cancer ([place])   . Cervical cancer ([place])   . Chronic kidney disease    history of kidney shut down  no current problems  . Constipation   . DDD (degenerative disc disease), cervical   . Edema   . Gallstones   . GERD (gastroesophageal reflux disease)   . High cholesterol   . History of anemia   . History of renal failure   . HPV (human papilloma virus) anogenital infection   . Leukocytosis    going to hematologist  . Migraine   . Obesity   . Obesity   . Pelvic inflammatory disease (PID) 05-08-11   previous hx. .Hx. childbirth x3-NVD  . PID (pelvic inflammatory disease)   . PTSD (post-traumatic stress disorder)   . Sciatica   . Scoliosis     Patient Active Problem List   Diagnosis Date Noted  . Vaginal delivery 11/16/2017  . Anemia in pregnancy 08/30/2017  . Unwanted fertility 08/23/2017  . Obesity affecting pregnancy, antepartum 06/19/2017  . Supervision of high risk pregnancy, antepartum, second trimester 05/29/2017  . History of preterm delivery, currently pregnant in second trimester 05/29/2017  . Tobacco use in pregnancy, antepartum, second trimester 05/29/2017  . Drug use affecting pregnancy in second trimester 05/29/2017  . History of premature delivery 08/10/2016  . Bipolar  disorder ([place]) 08/07/2016  . Herniated nucleus pulposus, L4-5 left 11/30/2014  . Chronic pain syndrome 10/28/2012  . Neck pain 10/28/2012  . Back pain 10/28/2012  . Osteoarthritis 10/28/2012  . Migraine   . Asthma     Past Surgical History:  Procedure Laterality Date  . ABDOMINAL EXPLORATION SURGERY  approx 35 years old   rlq(due to adhesions around ovaries)-appendix and ovaries remain  . ANTERIOR CERVICAL DECOMP/DISCECTOMY FUSION N/A 12/09/2012   Procedure: ANTERIOR CERVICAL DECOMPRESSION/DISCECTOMY FUSION STRUCTURAL ALLOGRAFT TRESTLE PLATE CERVICAL FIVE-SIX,SIX-SEVEN;  Surgeon: Otilio Connors, MD;  Location: [facility];  Service: Neurosurgery;  Laterality: N/A;  . BACK SURGERY    . CHOLECYSTECTOMY  05/10/2011   Procedure: LAPAROSCOPIC CHOLECYSTECTOMY WITH INTRAOPERATIVE CHOLANGIOGRAM;  Surgeon: Gayland Curry, MD,FACS;  Location: [facility];  Service: General;  Laterality: N/A;  . LUMBAR LAMINECTOMY/DECOMPRESSION MICRODISCECTOMY Left 11/30/2014   Procedure: Left lumbar four-five Microdiskectomy;  Surgeon: Kristeen Miss, MD;  Location: [facility];  Service: Neurosurgery;  Laterality: Left;  . LUMBAR LAMINECTOMY/DECOMPRESSION MICRODISCECTOMY Left 08/15/2015   Procedure: Left Lumbar four-five Microdiskectomy;  Surgeon: Kristeen Miss, MD;  Location: [facility];  Service: Neurosurgery;  Laterality: Left;  . LUMBAR LAMINECTOMY/DECOMPRESSION MICRODISCECTOMY Left 09/28/2016   Procedure: LEFT LUMBAR FIVE -SACRAL ONE Microdiscectomy;  Surgeon: Kristeen Miss, MD;  Location: [facility];  Service: Neurosurgery;  Laterality: Left;  . TUBAL  LIGATION Bilateral 11/16/2017   Procedure: POST PARTUM TUBAL LIGATION;  Surgeon: Aletha Halim, MD;  Location: [facility];  Service: Gynecology;  Laterality: Bilateral;    OB History    Gravida  6   Para  4   Term  3   Preterm  1   AB  2   Living  4     SAB  2   IAB      Ectopic      Multiple  0   Live Births  4            Home  Medications    Prior to Admission medications   Medication Sig Start Date End Date Taking? Authorizing Provider  Acetaminophen (TYLENOL PO) Take by mouth.   Yes [provider]  amoxicillin-clavulanate (AUGMENTIN) 875-125 MG tablet Take 1 tablet by mouth 2 (two) times daily. Patient not taking: Reported on 08/12/2019 01/26/18   Dalia Heading, PA-C  cyclobenzaprine (FLEXERIL) 10 MG tablet Take 1 tablet (10 mg total) by mouth 2 (two) times daily as needed for muscle spasms. Patient not taking: Reported on 08/02/2020 08/12/19   Etta Quill, NP  esomeprazole (NEXIUM) 40 MG capsule Take 1 capsule (40 mg total) by mouth daily at 12 noon. Patient not taking: Reported on 08/12/2019 12/31/18   Aletha Halim, MD  Guaifenesin 1200 MG TB12 Take 1 tablet (1,200 mg total) by mouth 2 (two) times daily. Patient not taking: Reported on 08/12/2019 01/26/18   Dalia Heading, PA-C  HYDROcodone-acetaminophen (NORCO/VICODIN) 5-325 MG tablet Take 1 tablet by mouth every 6 (six) hours as needed for moderate pain. Patient not taking: Reported on 08/12/2019 01/26/18   Dalia Heading, PA-C  ibuprofen (ADVIL,MOTRIN) 600 MG tablet Take 1 tablet (600 mg total) by mouth every 6 (six) hours. Patient not taking: Reported on 01/26/2018 11/17/17   Keitha Butte, CNM  naproxen (NAPROSYN) 375 MG tablet Take 1 tablet (375 mg total) by mouth 2 (two) times daily. Patient not taking: Reported on 08/02/2020 08/12/19   Etta Quill, NP  nystatin (MYCOSTATIN/NYSTOP) powder Apply 1 application topically 3 (three) times daily. Patient not taking: Reported on 08/02/2020 08/12/19   Etta Quill, NP  Prenatal Multivit-Min-Fe-FA (PRENATAL VITAMINS) 0.8 MG tablet Take 1 tablet by mouth daily. Patient not taking: Reported on 01/26/2018 05/29/17   Luvenia Redden, PA-C  ranitidine (ZANTAC) 150 MG tablet TAKE 2 TABLETS (300 MG TOTAL) BY MOUTH 2 (TWO) TIMES DAILY. Patient not taking: Reported on 08/12/2019 04/18/18   Woodroe Mode, MD     Family History Family History  Problem Relation Age of Onset  . Diabetes Mother   . Endometriosis Mother   . Cancer Mother        lung  . Diabetes Father   . Heart disease Father   . Prostate cancer Maternal Grandfather   . Cancer Maternal Grandfather        prostate  . Colon polyps Maternal Grandmother   . Cirrhosis Paternal Grandfather     Social History Social History   Tobacco Use  . Smoking status: Current Every Day Smoker    Packs/day: 1.00    Years: 12.00    Pack years: 12.00    Types: Cigarettes  . Smokeless tobacco: Never Used  . Tobacco comment: trying to quit  Vaping Use  . Vaping Use: Never used  Substance Use Topics  . Alcohol use: Not Currently    Comment: occasionally  . Drug use: Yes    Types: Marijuana,  Cocaine     Allergies   Banana, Latex, Orange fruit [citrus], and Miconazole   Review of Systems Review of Systems  Constitutional: Negative.   Respiratory: Negative.   Cardiovascular: Negative.   Genitourinary: Positive for dyspareunia, dysuria, flank pain, pelvic pain and vaginal pain. Negative for decreased urine volume, difficulty urinating, enuresis, frequency, genital sores, hematuria, menstrual problem, urgency, vaginal bleeding and vaginal discharge.  Neurological: Negative.      Physical Exam Triage Vital Signs ED Triage Vitals  Enc Vitals Group     BP 08/02/20 1325 113/88     Pulse Rate 08/02/20 1325 79     Resp 08/02/20 1323 (!) 26     Temp 08/02/20 1325 97.7 F (36.5 C)     Temp Source 08/02/20 1325 Oral     SpO2 08/02/20 1325 100 %     Weight --      Height --      Head Circumference --      Peak Flow --      Pain Score 08/02/20 1319 8     Pain Loc --      Pain Edu? --      Excl. in [place]? --    No data found.  Updated Vital Signs BP 113/88 (BP Location: Right Arm)   Pulse 79   Temp 97.7 F (36.5 C) (Oral)   Resp (!) 26   LMP 07/17/2020   SpO2 100%   Visual Acuity Right Eye Distance:   Left Eye  Distance:   Bilateral Distance:    Right Eye Near:   Left Eye Near:    Bilateral Near:     Physical Exam Constitutional:      Appearance: Normal appearance. She is obese.  HENT:     Head: Normocephalic.  Eyes:     Extraocular Movements: Extraocular movements intact.  Pulmonary:     Effort: Pulmonary effort is normal.  Abdominal:     General: Abdomen is flat. Bowel sounds are normal.     Palpations: Abdomen is soft.     Tenderness: There is abdominal tenderness in the suprapubic area. There is right CVA tenderness, left CVA tenderness and guarding.  Musculoskeletal:        General: Normal range of motion.  Skin:    General: Skin is warm and dry.  Neurological:     General: No focal deficit present.     Mental Status: She is alert and oriented to person, place, and time. Mental status is at baseline.  Psychiatric:        Mood and Affect: Mood normal.        Behavior: Behavior normal.        Thought Content: Thought content normal.        Judgment: Judgment normal.      UC Treatments / Results  Labs (all labs ordered are listed, but only abnormal results are displayed) Labs Reviewed  POCT URINALYSIS DIPSTICK, ED / UC - Abnormal; Notable for the following components:      Result Value   Bilirubin Urine SMALL (*)    Ketones, ur TRACE (*)    Protein, ur 30 (*)    All other components within normal limits  POC URINE PREG, ED    EKG   Radiology No results found.  Procedures Procedures (including critical care time)  Medications Ordered in UC Medications - No data to display  Initial Impression / Assessment and Plan / UC Course  I have reviewed the triage vital  signs and the nursing notes.  Pertinent labs & imaging results that were available during my care of the patient were reviewed by me and considered in my medical decision making (see chart for details).  Clinical Course as of 08/02/20 1411  Tue Aug 02, 2020  1338 Leukocytes,Ua: NEGATIVE [AW]     Clinical Course User Index [AW] Hans Eden, NP    Suprapubic abdominal pain   Due to severity of abdominal pain patient is being sent to emergency department for further evaluation. Patient guarding during exam, becoming tearful, deep breathing to control pain and curled into a ball once I finished. Urinalysis and urine pregnancy are negative. Declining to be wheeled down to ED, patient will escort self to ED.  Final Clinical Impressions(s) / UC Diagnoses   Final diagnoses:  Suprapubic abdominal pain     Discharge Instructions     Go to emergency department for evaluation    ED Prescriptions    None     PDMP not reviewed this encounter.   Hans Eden, NP 08/02/20 1430

## 2020-08-02 NOTE — ED Triage Notes (Signed)
t presents w/suprapubic pain, pain in her vagina and rectum. Pain w/urination, pt seen at UC reports dark colored urine. Sent her for further evaluation. Pt reports pain x2 months, increase pain x3 days

## 2020-08-02 NOTE — ED Triage Notes (Addendum)
Vaginal pain for 3 days.  Denies vaginal discharge.  Reports odor is "burnt copper smell".  Complains of headache, head is beating.  Pain with urination.  Complains of lower back and lower abdominal pain  Has taken tylenol today

## 2020-08-02 NOTE — ED Provider Notes (Signed)
[facility] EMERGENCY DEPARTMENT Provider Note   CSN: [id number] Arrival date & time: 08/02/20  1447     History Chief Complaint  Patient presents with  . Vaginal Pain    [name] is a 35 y.o. female with past medical history of bipolar 1, cervical cancer status post LEEP, leukocytosis, pelvic inflammatory disease, PTSD, who presents today for evaluation of pelvic pain. She states that over the past 2 months she has had intermittent pain in her vagina/vulva and rectum.  She states that about 72 hours she was having penetrative intercourse and had sudden worsening of pain.  She states that she has had the same partner for many years.  She notes that she has had a smell of burnt copper from her vulva.  She states that she has not been having any fevers.  She is nauseous and vomited twice, and also notes that she has had diarrhea over the past few days.  She states that she has had her tubes tied.  Her last menstrual cycle was normal for her.  She states that she feels like someone is stabbing her in her vagina intermittently.    HPI     Past Medical History:  Diagnosis Date  . Abortion in first trimester 08/2016  . Anemia   . Anxiety    Panic attack  . Arthritis   . Asthma   . Bipolar 1 disorder ([place])   . Cancer ([place])   . Cervical cancer ([place])   . Chronic kidney disease    history of kidney shut down  no current problems  . Constipation   . DDD (degenerative disc disease), cervical   . Edema   . Gallstones   . GERD (gastroesophageal reflux disease)   . High cholesterol   . History of anemia   . History of renal failure   . HPV (human papilloma virus) anogenital infection   . Leukocytosis    going to hematologist  . Migraine   . Obesity   . Obesity   . Pelvic inflammatory disease (PID) 05-08-11   previous hx. .Hx. childbirth x3-NVD  . PID (pelvic inflammatory disease)   . PTSD (post-traumatic stress disorder)   . Sciatica   . Scoliosis      Patient Active Problem List   Diagnosis Date Noted  . Vaginal delivery 11/16/2017  . Anemia in pregnancy 08/30/2017  . Unwanted fertility 08/23/2017  . Obesity affecting pregnancy, antepartum 06/19/2017  . Supervision of high risk pregnancy, antepartum, second trimester 05/29/2017  . History of preterm delivery, currently pregnant in second trimester 05/29/2017  . Tobacco use in pregnancy, antepartum, second trimester 05/29/2017  . Drug use affecting pregnancy in second trimester 05/29/2017  . History of premature delivery 08/10/2016  . Bipolar disorder ([place]) 08/07/2016  . Herniated nucleus pulposus, L4-5 left 11/30/2014  . Chronic pain syndrome 10/28/2012  . Neck pain 10/28/2012  . Back pain 10/28/2012  . Osteoarthritis 10/28/2012  . Migraine   . Asthma     Past Surgical History:  Procedure Laterality Date  . ABDOMINAL EXPLORATION SURGERY  approx 35 years old   rlq(due to adhesions around ovaries)-appendix and ovaries remain  . ANTERIOR CERVICAL DECOMP/DISCECTOMY FUSION N/A 12/09/2012   Procedure: ANTERIOR CERVICAL DECOMPRESSION/DISCECTOMY FUSION STRUCTURAL ALLOGRAFT TRESTLE PLATE CERVICAL FIVE-SIX,SIX-SEVEN;  Surgeon: Otilio Connors, MD;  Location: [facility];  Service: Neurosurgery;  Laterality: N/A;  . BACK SURGERY    . CHOLECYSTECTOMY  05/10/2011   Procedure: LAPAROSCOPIC CHOLECYSTECTOMY WITH INTRAOPERATIVE  CHOLANGIOGRAM;  Surgeon: Gayland Curry, MD,FACS;  Location: [facility];  Service: General;  Laterality: N/A;  . LUMBAR LAMINECTOMY/DECOMPRESSION MICRODISCECTOMY Left 11/30/2014   Procedure: Left lumbar four-five Microdiskectomy;  Surgeon: Kristeen Miss, MD;  Location: [facility];  Service: Neurosurgery;  Laterality: Left;  . LUMBAR LAMINECTOMY/DECOMPRESSION MICRODISCECTOMY Left 08/15/2015   Procedure: Left Lumbar four-five Microdiskectomy;  Surgeon: Kristeen Miss, MD;  Location: [facility];  Service: Neurosurgery;  Laterality: Left;  . LUMBAR LAMINECTOMY/DECOMPRESSION  MICRODISCECTOMY Left 09/28/2016   Procedure: LEFT LUMBAR FIVE -SACRAL ONE Microdiscectomy;  Surgeon: Kristeen Miss, MD;  Location: [facility];  Service: Neurosurgery;  Laterality: Left;  . TUBAL LIGATION Bilateral 11/16/2017   Procedure: POST PARTUM TUBAL LIGATION;  Surgeon: Aletha Halim, MD;  Location: [facility];  Service: Gynecology;  Laterality: Bilateral;     OB History    Gravida  6   Para  4   Term  3   Preterm  1   AB  2   Living  4     SAB  2   IAB      Ectopic      Multiple  0   Live Births  4           Family History  Problem Relation Age of Onset  . Diabetes Mother   . Endometriosis Mother   . Cancer Mother        lung  . Diabetes Father   . Heart disease Father   . Prostate cancer Maternal Grandfather   . Cancer Maternal Grandfather        prostate  . Colon polyps Maternal Grandmother   . Cirrhosis Paternal Grandfather     Social History   Tobacco Use  . Smoking status: Current Every Day Smoker    Packs/day: 1.00    Years: 12.00    Pack years: 12.00    Types: Cigarettes  . Smokeless tobacco: Never Used  . Tobacco comment: trying to quit  Vaping Use  . Vaping Use: Never used  Substance Use Topics  . Alcohol use: Not Currently    Comment: occasionally  . Drug use: Yes    Types: Marijuana, Cocaine    Home Medications Prior to Admission medications   Medication Sig Start Date End Date Taking? Authorizing Provider  Acetaminophen (TYLENOL PO) Take by mouth.    [provider]  amoxicillin-clavulanate (AUGMENTIN) 875-125 MG tablet Take 1 tablet by mouth 2 (two) times daily. Patient not taking: Reported on 08/12/2019 01/26/18   Dalia Heading, PA-C  cyclobenzaprine (FLEXERIL) 10 MG tablet Take 1 tablet (10 mg total) by mouth 2 (two) times daily as needed for muscle spasms. Patient not taking: Reported on 08/02/2020 08/12/19   Etta Quill, NP  esomeprazole (NEXIUM) 40 MG capsule Take 1 capsule (40 mg total) by mouth daily  at 12 noon. 08/02/20   Lorin Glass, PA-C  Guaifenesin 1200 MG TB12 Take 1 tablet (1,200 mg total) by mouth 2 (two) times daily. Patient not taking: Reported on 08/12/2019 01/26/18   Dalia Heading, PA-C  HYDROcodone-acetaminophen (NORCO/VICODIN) 5-325 MG tablet Take 1 tablet by mouth every 6 (six) hours as needed for moderate pain. Patient not taking: Reported on 08/12/2019 01/26/18   Dalia Heading, PA-C  ibuprofen (ADVIL,MOTRIN) 600 MG tablet Take 1 tablet (600 mg total) by mouth every 6 (six) hours. Patient not taking: Reported on 01/26/2018 11/17/17   Keitha Butte, CNM  naproxen (NAPROSYN) 375 MG tablet Take 1 tablet (375 mg total)  by mouth 2 (two) times daily. Patient not taking: Reported on 08/02/2020 08/12/19   Etta Quill, NP  nystatin (MYCOSTATIN/NYSTOP) powder Apply 1 application topically 3 (three) times daily. Patient not taking: Reported on 08/02/2020 08/12/19   Etta Quill, NP  Prenatal Multivit-Min-Fe-FA (PRENATAL VITAMINS) 0.8 MG tablet Take 1 tablet by mouth daily. Patient not taking: Reported on 01/26/2018 05/29/17   Luvenia Redden, PA-C  ranitidine (ZANTAC) 150 MG tablet TAKE 2 TABLETS (300 MG TOTAL) BY MOUTH 2 (TWO) TIMES DAILY. Patient not taking: Reported on 08/12/2019 04/18/18   Woodroe Mode, MD    Allergies    Banana, Latex, Orange fruit [citrus], and Miconazole  Review of Systems   Review of Systems  Constitutional: Negative for chills and fever.  HENT: Negative for congestion.   Eyes: Negative for visual disturbance.  Respiratory: Negative for chest tightness and shortness of breath.   Gastrointestinal: Positive for abdominal pain, diarrhea, nausea, rectal pain and vomiting.  Genitourinary: Positive for pelvic pain and vaginal pain. Negative for vaginal bleeding and vaginal discharge.  Musculoskeletal: Negative for back pain and neck pain.  Skin: Negative for color change, rash and wound.  Neurological: Negative for weakness and headaches.   Psychiatric/Behavioral: Negative for confusion.  All other systems reviewed and are negative.   Physical Exam Updated Vital Signs BP 118/66 (BP Location: Right Arm)   Pulse 80   Temp 98.1 F (36.7 C) (Oral)   Resp 18   LMP 07/17/2020   SpO2 99%   Physical Exam Vitals and nursing note reviewed. Exam conducted with a chaperone present (Female RN).  Constitutional:      General: She is not in acute distress.    Appearance: She is not diaphoretic.  HENT:     Head: Normocephalic and atraumatic.  Eyes:     General: No scleral icterus.       Right eye: No discharge.        Left eye: No discharge.     Conjunctiva/sclera: Conjunctivae normal.  Cardiovascular:     Rate and Rhythm: Normal rate and regular rhythm.  Pulmonary:     Effort: Pulmonary effort is normal. No respiratory distress.     Breath sounds: No stridor.  Abdominal:     General: Bowel sounds are normal. There is no distension.     Palpations: Abdomen is soft.     Tenderness: There is abdominal tenderness in the suprapubic area and left lower quadrant.  Genitourinary:    Comments: Normal external female genitalia.  There is no visualized abnormal discharge in the vaginal canal.  There is mild left adnexal tenderness without fullness, and this is different than her reported pain. There is no right adnexal tenderness, no cervical motion tenderness.   Unable to clearly visualize cervix secondary to patient's pain Musculoskeletal:        General: No deformity.     Cervical back: Normal range of motion.  Skin:    General: Skin is warm and dry.  Neurological:     Mental Status: She is alert.     Motor: No abnormal muscle tone.  Psychiatric:        Behavior: Behavior normal.     ED Results / Procedures / Treatments   Labs (all labs ordered are listed, but only abnormal results are displayed) Labs Reviewed  WET PREP, GENITAL - Abnormal; Notable for the following components:      Result Value   WBC, Wet Prep HPF  POC MANY (*)  All other components within normal limits  CBC WITH DIFFERENTIAL/PLATELET - Abnormal; Notable for the following components:   WBC 14.3 (*)    MCV 76.8 (*)    MCH 24.8 (*)    RDW 18.0 (*)    Neutro Abs 8.3 (*)    Lymphs Abs 5.3 (*)    All other components within normal limits  COMPREHENSIVE METABOLIC PANEL - Abnormal; Notable for the following components:   CO2 20 (*)    All other components within normal limits  URINALYSIS, COMPLETE (UACMP) WITH MICROSCOPIC - Abnormal; Notable for the following components:   Color, Urine AMBER (*)    APPearance CLOUDY (*)    Protein, ur 30 (*)    Bacteria, UA FEW (*)    All other components within normal limits  LIPASE, BLOOD  PREGNANCY, URINE  I-STAT BETA HCG BLOOD, ED ([facility], [facility], [facility] ONLY)  GC/CHLAMYDIA PROBE AMP () NOT AT [facility]    EKG None  Radiology CT Abdomen Pelvis W Contrast  Result Date: 08/02/2020 CLINICAL DATA:  Nausea vomiting left lower quadrant pain EXAM: CT ABDOMEN AND PELVIS WITH CONTRAST TECHNIQUE: Multidetector CT imaging of the abdomen and pelvis was performed using the standard protocol following bolus administration of intravenous contrast. CONTRAST:  185mL OMNIPAQUE IOHEXOL 300 MG/ML  SOLN COMPARISON:  Ultrasound 03/09/2011 FINDINGS: Lower chest: Lung bases demonstrate no acute consolidation or effusion. Normal cardiac size. Hepatobiliary: Status post cholecystectomy. Prominent extrahepatic bile duct likely due to surgical change. No focal hepatic abnormality. Pancreas: Unremarkable. No pancreatic ductal dilatation or surrounding inflammatory changes. Spleen: Normal in size without focal abnormality. Adrenals/Urinary Tract: Adrenal glands are unremarkable. Kidneys are normal, without renal calculi, focal lesion, or hydronephrosis. Bladder is unremarkable. Stomach/Bowel: Stomach is within normal limits. Nonvisualized appendix with clips at the cecum suggesting prior appendectomy. No evidence of bowel wall  thickening, distention, or inflammatory changes. Vascular/Lymphatic: Mild aortic atherosclerosis without aneurysm. No enlarged abdominal or pelvic lymph nodes. Reproductive: Uterus unremarkable. Bilateral ligation clips. No adnexal mass Other: No abdominal wall hernia or abnormality. No abdominopelvic ascites. Musculoskeletal: No acute or significant osseous findings. Multilevel degenerative change with advanced disease at L4-L5 and L5-S1. IMPRESSION: No CT evidence for acute intra-abdominal or pelvic abnormality. Electronically Signed   By: Donavan Foil M.D.   On: 08/02/2020 22:02    Procedures Procedures   Medications Ordered in ED Medications  sodium chloride 0.9 % bolus 1,000 mL (0 mLs Intravenous Stopped 08/02/20 1943)  alum & mag hydroxide-simeth (MAALOX/MYLANTA) 200-200-20 MG/5ML suspension 30 mL (30 mLs Oral Given 08/02/20 2119)  morphine 4 MG/ML injection 4 mg (4 mg Intravenous Given 08/02/20 2120)  iohexol (OMNIPAQUE) 300 MG/ML solution 100 mL (100 mLs Intravenous Contrast Given 08/02/20 2149)    ED Course  I have reviewed the triage vital signs and the nursing notes.  Pertinent labs & imaging results that were available during my care of the patient were reviewed by me and considered in my medical decision making (see chart for details).    MDM Rules/Calculators/A&P                          Patient is a 35 year old woman who presents today for evaluation of vulvodynia/pelvic pain.  She states that for the past 2 months she has had pelvic pain, acutely worsened in the past 3 days.  Here she appears uncomfortable.   Labs are obtained and reviewed, she has mild leukocytosis however this appears to be her baseline.  She is not anemic.  CMP is unremarkable.  Lipase is not elevated.  UA shows 21-50 squamous epithelial cells, without clear evidence of infection. Pregnancy test is negative.  Pelvic exam was performed, no significant cervical motion tenderness.  Limited visualization of the  cervix. GC testing is sent.  CT abdomen pelvis is obtained without cause for patient's symptoms found. I recommended that she follow-up with OB/GYN.  Recommended pelvic rest until then.  She did report heartburn and being out of her Nexium, she is given a prescription for this per her request.  She denies specific chest pain.  No cough or shortness of breath.  She is not concerned for other cause of this symptom and would not have come in for the lung, rather just while she is here would like a refill.  Return precautions were discussed with patient who states their understanding.  At the time of discharge patient denied any unaddressed complaints or concerns.  Patient is agreeable for discharge home.  Note: Portions of this report may have been transcribed using voice recognition software. Every effort was made to ensure accuracy; however, inadvertent computerized transcription errors may be present  Final Clinical Impression(s) / ED Diagnoses Final diagnoses:  Pelvic pain    Rx / DC Orders ED Discharge Orders         Ordered    esomeprazole (NEXIUM) 40 MG capsule  Daily        08/02/20 2231           Lorin Glass, Hershal Coria 08/02/20 2244    Noemi Chapel, MD 08/03/20 1616

## 2020-08-02 NOTE — ED Provider Notes (Signed)
Emergency Medicine Provider Triage Evaluation Note  [name] , a 35 y.o. female  was evaluated in triage.  Pt complains of pain in the vagina that goes to the anus, buttocks, lower abdomen. Pain worse with walking, having sex, urinating, pooping. Has been going on for 2 months but acutely worsening 72 hr ago. Went to urgent care and told to come to ED for a scan.  Review of Systems  Positive: Vaginal and anal pain, abdomen pain Negative: Fever  Physical Exam  BP (!) 136/113 (BP Location: Right Arm)   Pulse (!) 101   Temp 98.2 F (36.8 C) (Oral)   Resp 16   LMP 07/17/2020   SpO2 98%  Gen:   Awake, no distress   Resp:  Normal effort  MSK:   Moves extremities without difficulty  abd:  Mild diffuse lower abdomen, bilateral low CVA tenderness  Medical Decision Making  Medically screening exam initiated at 3:06 PM.  Appropriate orders placed.  [name] was informed that the remainder of the evaluation will be completed by another provider, this initial triage assessment does not replace that evaluation, and the importance of remaining in the ED until their evaluation is complete.    Candace Feil, PA-C 08/02/20 [name], [place], DO 08/02/20 1615

## 2020-08-02 NOTE — Discharge Instructions (Signed)
Go to emergency department for evaluation

## 2020-08-02 NOTE — ED Notes (Signed)
Pt talking on phone

## 2020-08-03 LAB — GC/CHLAMYDIA PROBE AMP (~~LOC~~) NOT AT ARMC
Chlamydia: NEGATIVE
Comment: NEGATIVE
Comment: NORMAL
Neisseria Gonorrhea: NEGATIVE

## 2021-06-05 ENCOUNTER — Encounter (HOSPITAL_COMMUNITY): Payer: Self-pay | Admitting: Emergency Medicine

## 2021-06-05 ENCOUNTER — Ambulatory Visit (HOSPITAL_COMMUNITY)
Admission: EM | Admit: 2021-06-05 | Discharge: 2021-06-05 | Disposition: A | Discharge status: Home / Self Care | Payer: Medicare Other | Attending: Physician Assistant | Admitting: Physician Assistant

## 2021-06-05 DIAGNOSIS — B3731 Acute candidiasis of vulva and vagina: Secondary | ICD-10-CM | POA: Insufficient documentation

## 2021-06-05 LAB — POCT URINALYSIS DIPSTICK, ED / UC
Bilirubin Urine: NEGATIVE
Glucose, UA: NEGATIVE mg/dL
Hgb urine dipstick: NEGATIVE
Ketones, ur: NEGATIVE mg/dL
Leukocytes,Ua: NEGATIVE
Nitrite: NEGATIVE
Protein, ur: NEGATIVE mg/dL
Specific Gravity, Urine: <=1.005 (ref 1.005–1.030)
Urobilinogen, UA: 0.2 mg/dL (ref 0.0–1.0)
pH: 6 (ref 5.0–8.0)

## 2021-06-05 MED ORDER — FLUCONAZOLE 150 MG PO TABS: 150.0000 mg | ORAL_TABLET | Freq: Every day | ORAL | 0 refills | Status: AC

## 2021-06-05 NOTE — Discharge Instructions (Addendum)
Take medication as prescribe ?Will call with test results ?Return for evaluation if symptoms become worse.  ?

## 2021-06-05 NOTE — ED Provider Notes (Incomplete)
[place]    CSN: [id number] Arrival date & time: 06/05/21  1911      History   Chief Complaint Chief Complaint  Patient presents with   Vaginal Itching    HPI [name] is a 36 y.o. female.   HPI  Past Medical History:  Diagnosis Date   Abortion in first trimester 08/2016   Anemia    Anxiety    Panic attack   Arthritis    Asthma    Bipolar 1 disorder ([place])    Cancer (HCC)    Cervical cancer ([place])    Chronic kidney disease    history of kidney shut down  no current problems   Constipation    DDD (degenerative disc disease), cervical    Edema    Gallstones    GERD (gastroesophageal reflux disease)    High cholesterol    History of anemia    History of renal failure    HPV (human papilloma virus) anogenital infection    Leukocytosis    going to hematologist   Migraine    Obesity    Obesity    Pelvic inflammatory disease (PID) 05-08-11   previous hx. .Hx. childbirth x3-NVD   PID (pelvic inflammatory disease)    PTSD (post-traumatic stress disorder)    Sciatica    Scoliosis     Patient Active Problem List   Diagnosis Date Noted   Vaginal delivery 11/16/2017   Anemia in pregnancy 08/30/2017   Unwanted fertility 08/23/2017   Obesity affecting pregnancy, antepartum 06/19/2017   Supervision of high risk pregnancy, antepartum, second trimester 05/29/2017   History of preterm delivery, currently pregnant in second trimester 05/29/2017   Tobacco use in pregnancy, antepartum, second trimester 05/29/2017   Drug use affecting pregnancy in second trimester 05/29/2017   History of premature delivery 08/10/2016   Bipolar disorder ([place]) 08/07/2016   Herniated nucleus pulposus, L4-5 left 11/30/2014   Chronic pain syndrome 10/28/2012   Neck pain 10/28/2012   Back pain 10/28/2012   Osteoarthritis 10/28/2012   Migraine    Asthma     Past Surgical History:  Procedure Laterality Date   ABDOMINAL EXPLORATION SURGERY  approx 36 years old    rlq(due to adhesions around ovaries)-appendix and ovaries remain   ANTERIOR CERVICAL DECOMP/DISCECTOMY FUSION N/A 12/09/2012   Procedure: ANTERIOR CERVICAL DECOMPRESSION/DISCECTOMY FUSION STRUCTURAL ALLOGRAFT TRESTLE PLATE CERVICAL FIVE-SIX,SIX-SEVEN;  Surgeon: Otilio Connors, MD;  Location: [facility];  Service: Neurosurgery;  Laterality: N/A;   BACK SURGERY     CHOLECYSTECTOMY  05/10/2011   Procedure: LAPAROSCOPIC CHOLECYSTECTOMY WITH INTRAOPERATIVE CHOLANGIOGRAM;  Surgeon: Gayland Curry, MD,FACS;  Location: [facility];  Service: General;  Laterality: N/A;   LUMBAR LAMINECTOMY/DECOMPRESSION MICRODISCECTOMY Left 11/30/2014   Procedure: Left lumbar four-five Microdiskectomy;  Surgeon: Kristeen Miss, MD;  Location: [facility];  Service: Neurosurgery;  Laterality: Left;   LUMBAR LAMINECTOMY/DECOMPRESSION MICRODISCECTOMY Left 08/15/2015   Procedure: Left Lumbar four-five Microdiskectomy;  Surgeon: Kristeen Miss, MD;  Location: [facility];  Service: Neurosurgery;  Laterality: Left;   LUMBAR LAMINECTOMY/DECOMPRESSION MICRODISCECTOMY Left 09/28/2016   Procedure: LEFT LUMBAR FIVE -SACRAL ONE Microdiscectomy;  Surgeon: Kristeen Miss, MD;  Location: [facility];  Service: Neurosurgery;  Laterality: Left;   TUBAL LIGATION Bilateral 11/16/2017   Procedure: POST PARTUM TUBAL LIGATION;  Surgeon: Aletha Halim, MD;  Location: [facility];  Service: Gynecology;  Laterality: Bilateral;    OB History     Gravida  6   Para  4  Term  3   Preterm  1   AB  2   Living  4      SAB  2   IAB      Ectopic      Multiple  0   Live Births  4            Home Medications    Prior to Admission medications   Medication Sig Start Date End Date Taking? Authorizing Provider  fluconazole (DIFLUCAN) 150 MG tablet Take 1 tablet (150 mg total) by mouth daily for 1 day. Take 1 tablet by mouth, if no improvement after 72 hours may take second tablet. 06/05/21 06/06/21 Yes Ward, Lenise Arena, PA-C  Acetaminophen  (TYLENOL PO) Take by mouth.    [provider]  amoxicillin-clavulanate (AUGMENTIN) 875-125 MG tablet Take 1 tablet by mouth 2 (two) times daily. Patient not taking: Reported on 08/12/2019 01/26/18   Dalia Heading, PA-C  cyclobenzaprine (FLEXERIL) 10 MG tablet Take 1 tablet (10 mg total) by mouth 2 (two) times daily as needed for muscle spasms. Patient not taking: Reported on 08/02/2020 08/12/19   Etta Quill, NP  esomeprazole (NEXIUM) 40 MG capsule Take 1 capsule (40 mg total) by mouth daily at 12 noon. 08/02/20   Lorin Glass, PA-C  Guaifenesin 1200 MG TB12 Take 1 tablet (1,200 mg total) by mouth 2 (two) times daily. Patient not taking: Reported on 08/12/2019 01/26/18   Dalia Heading, PA-C  HYDROcodone-acetaminophen (NORCO/VICODIN) 5-325 MG tablet Take 1 tablet by mouth every 6 (six) hours as needed for moderate pain. Patient not taking: Reported on 08/12/2019 01/26/18   Dalia Heading, PA-C  ibuprofen (ADVIL,MOTRIN) 600 MG tablet Take 1 tablet (600 mg total) by mouth every 6 (six) hours. Patient not taking: Reported on 01/26/2018 11/17/17   Keitha Butte, CNM  naproxen (NAPROSYN) 375 MG tablet Take 1 tablet (375 mg total) by mouth 2 (two) times daily. Patient not taking: Reported on 08/02/2020 08/12/19   Etta Quill, NP  nystatin (MYCOSTATIN/NYSTOP) powder Apply 1 application topically 3 (three) times daily. Patient not taking: Reported on 08/02/2020 08/12/19   Etta Quill, NP  Prenatal Multivit-Min-Fe-FA (PRENATAL VITAMINS) 0.8 MG tablet Take 1 tablet by mouth daily. Patient not taking: Reported on 01/26/2018 05/29/17   Luvenia Redden, PA-C  ranitidine (ZANTAC) 150 MG tablet TAKE 2 TABLETS (300 MG TOTAL) BY MOUTH 2 (TWO) TIMES DAILY. Patient not taking: Reported on 08/12/2019 04/18/18   Woodroe Mode, MD    Family History Family History  Problem Relation Age of Onset   Diabetes Mother    Endometriosis Mother    Cancer Mother        lung   Diabetes Father     Heart disease Father    Prostate cancer Maternal Grandfather    Cancer Maternal Grandfather        prostate   Colon polyps Maternal Grandmother    Cirrhosis Paternal Grandfather     Social History Social History   Tobacco Use   Smoking status: Every Day    Packs/day: 1.00    Years: 12.00    Pack years: 12.00    Types: Cigarettes   Smokeless tobacco: Never   Tobacco comments:    trying to quit  Vaping Use   Vaping Use: Never used  Substance Use Topics   Alcohol use: Not Currently    Comment: occasionally   Drug use: Yes    Types: Marijuana, Cocaine     Allergies  Banana, Latex, Orange fruit [citrus], and Miconazole   Review of Systems Review of Systems   Physical Exam Triage Vital Signs ED Triage Vitals  Enc Vitals Group     BP 06/05/21 1952 103/67     Pulse Rate 06/05/21 1952 80     Resp 06/05/21 1952 17     Temp 06/05/21 1952 98.2 F (36.8 C)     Temp Source 06/05/21 1952 Oral     SpO2 06/05/21 1952 98 %     Weight --      Height --      Head Circumference --      Peak Flow --      Pain Score 06/05/21 1950 0     Pain Loc --      Pain Edu? --      Excl. in [place]? --    No data found.  Updated Vital Signs BP 103/67 (BP Location: Right Arm)    Pulse 80    Temp 98.2 F (36.8 C) (Oral)    Resp 17    LMP 05/13/2021    SpO2 98%   Visual Acuity Right Eye Distance:   Left Eye Distance:   Bilateral Distance:    Right Eye Near:   Left Eye Near:    Bilateral Near:     Physical Exam   UC Treatments / Results  Labs (all labs ordered are listed, but only abnormal results are displayed) Labs Reviewed  POCT URINALYSIS DIPSTICK, ED / UC  CERVICOVAGINAL ANCILLARY ONLY    EKG   Radiology No results found.  Procedures Procedures (including critical care time)  Medications Ordered in UC Medications - No data to display  Initial Impression / Assessment and Plan / UC Course  I have reviewed the triage vital signs and the nursing  notes.  Pertinent labs & imaging results that were available during my care of the patient were reviewed by me and considered in my medical decision making (see chart for details).     *** Final Clinical Impressions(s) / UC Diagnoses   Final diagnoses:  Vaginal yeast infection     Discharge Instructions      Take medication as prescribe Will call with test results Return for evaluation if symptoms become worse.    ED Prescriptions     Medication Sig Dispense Auth. Provider   fluconazole (DIFLUCAN) 150 MG tablet Take 1 tablet (150 mg total) by mouth daily for 1 day. Take 1 tablet by mouth, if no improvement after 72 hours may take second tablet. 2 tablet Ward, Lenise Arena, PA-C      PDMP not reviewed this encounter.

## 2021-06-05 NOTE — ED Triage Notes (Signed)
Pt c/o of vaginal itching that started today then started having white discharge as well. Reports that her doctor hasnt called her back today and can't take the itching any more.  ?

## 2021-06-06 LAB — CERVICOVAGINAL ANCILLARY ONLY
Bacterial Vaginitis (gardnerella): NEGATIVE
Candida Glabrata: NEGATIVE
Candida Vaginitis: POSITIVE — AB
Chlamydia: NEGATIVE
Comment: NEGATIVE
Comment: NEGATIVE
Comment: NEGATIVE
Comment: NEGATIVE
Comment: NEGATIVE
Comment: NORMAL
Neisseria Gonorrhea: NEGATIVE
Trichomonas: NEGATIVE

## 2021-07-16 ENCOUNTER — Encounter (HOSPITAL_COMMUNITY): Payer: Self-pay

## 2021-07-16 ENCOUNTER — Ambulatory Visit (HOSPITAL_COMMUNITY)
Admission: EM | Admit: 2021-07-16 | Discharge: 2021-07-16 | Disposition: A | Discharge status: Home / Self Care | Payer: Medicare Other | Attending: Emergency Medicine | Admitting: Emergency Medicine

## 2021-07-16 DIAGNOSIS — M25511 Pain in right shoulder: Secondary | ICD-10-CM

## 2021-07-16 MED ORDER — TRAMADOL HCL 50 MG PO TABS: 50.0000 mg | ORAL_TABLET | Freq: Two times a day (BID) | ORAL | 0 refills | Status: DC | PRN

## 2021-07-16 MED ORDER — ACETAMINOPHEN 500 MG PO TABS: 500.0000 mg | ORAL_TABLET | Freq: Four times a day (QID) | ORAL | 0 refills | Status: DC | PRN

## 2021-07-16 NOTE — ED Triage Notes (Signed)
C/o right shoulder and arm pain that started today after picking up her daughter. She reports hearing a pop. ?

## 2021-07-16 NOTE — Discharge Instructions (Addendum)
Take Tylenol 500 mg as needed for mild to moderate pain ?Take tramadol 50 mg as needed for severe pain ?Follow RICE instructions as attached ?Follow-up with PCP ?Return or go to ED if you develop any new or worsening of your symptoms ?

## 2021-07-16 NOTE — ED Provider Notes (Signed)
?[place] ? ? ?[id number] ?07/16/21 Arrival Time: 4098 ? ? ?Chief Complaint  ?Patient presents with  ? Shoulder Pain  ? ? ? ?SUBJECTIVE: ?History from: patient. ? ?[name] is a 36 y.o. female with history of cervical and lumbar back pain presented to the urgent care with a complaint of right shoulder pain that started today.  Developed the symptom after picking up her daughter.  Localized the pain to the right shoulder.  She rated the pain a 10 on a scale of 1-10.  She described the pain as constant and achy.  She has not tried any medication for relief.  Her symptoms are made worse with ROM.  She denies similar symptoms in the past.  She denies chills, fever, nausea, vomiting and diarrhea. ? ? ?ROS: As per HPI.  All other pertinent ROS negative.    ? ?Past Medical History:  ?Diagnosis Date  ? Abortion in first trimester 08/2016  ? Anemia   ? Anxiety   ? Panic attack  ? Arthritis   ? Asthma   ? Bipolar 1 disorder ([place])   ? Cancer [facility])   ? Cervical cancer ([place])   ? Chronic kidney disease   ? history of kidney shut down  no current problems  ? Constipation   ? DDD (degenerative disc disease), cervical   ? Edema   ? Gallstones   ? GERD (gastroesophageal reflux disease)   ? High cholesterol   ? History of anemia   ? History of renal failure   ? HPV (human papilloma virus) anogenital infection   ? Leukocytosis   ? going to hematologist  ? Migraine   ? Obesity   ? Obesity   ? Pelvic inflammatory disease (PID) 05-08-11  ? previous hx. .Hx. childbirth x3-NVD  ? PID (pelvic inflammatory disease)   ? PTSD (post-traumatic stress disorder)   ? Sciatica   ? Scoliosis   ? ?Past Surgical History:  ?Procedure Laterality Date  ? ABDOMINAL EXPLORATION SURGERY  approx 35 years old  ? rlq(due to adhesions around ovaries)-appendix and ovaries remain  ? ANTERIOR CERVICAL DECOMP/DISCECTOMY FUSION N/A 12/09/2012  ? Procedure: ANTERIOR CERVICAL DECOMPRESSION/DISCECTOMY FUSION STRUCTURAL ALLOGRAFT TRESTLE PLATE  CERVICAL FIVE-SIX,SIX-SEVEN;  Surgeon: Otilio Connors, MD;  Location: [facility];  Service: Neurosurgery;  Laterality: N/A;  ? BACK SURGERY    ? CHOLECYSTECTOMY  05/10/2011  ? Procedure: LAPAROSCOPIC CHOLECYSTECTOMY WITH INTRAOPERATIVE CHOLANGIOGRAM;  Surgeon: Gayland Curry, MD,FACS;  Location: [facility];  Service: General;  Laterality: N/A;  ? LUMBAR LAMINECTOMY/DECOMPRESSION MICRODISCECTOMY Left 11/30/2014  ? Procedure: Left lumbar four-five Microdiskectomy;  Surgeon: Kristeen Miss, MD;  Location: [facility];  Service: Neurosurgery;  Laterality: Left;  ? LUMBAR LAMINECTOMY/DECOMPRESSION MICRODISCECTOMY Left 08/15/2015  ? Procedure: Left Lumbar four-five Microdiskectomy;  Surgeon: Kristeen Miss, MD;  Location: [facility];  Service: Neurosurgery;  Laterality: Left;  ? LUMBAR LAMINECTOMY/DECOMPRESSION MICRODISCECTOMY Left 09/28/2016  ? Procedure: LEFT LUMBAR FIVE -SACRAL ONE Microdiscectomy;  Surgeon: Kristeen Miss, MD;  Location: [facility];  Service: Neurosurgery;  Laterality: Left;  ? TUBAL LIGATION Bilateral 11/16/2017  ? Procedure: POST PARTUM TUBAL LIGATION;  Surgeon: Aletha Halim, MD;  Location: [facility];  Service: Gynecology;  Laterality: Bilateral;  ? ?Allergies  ?Allergen Reactions  ? Banana Itching  ?    Itchy throat  ? Latex Itching and Rash  ?  Red spots  ? Orange Fruit [Citrus] Other (See Comments)  ?  Mouth pain, acid reflux   ? Miconazole Rash  and Other (See Comments)  ?  Paradoxical effect: increases yeast (also)  ? ?No current facility-administered medications on file prior to encounter.  ? ?Current Outpatient Medications on File Prior to Encounter  ?Medication Sig Dispense Refill  ? amoxicillin-clavulanate (AUGMENTIN) 875-125 MG tablet Take 1 tablet by mouth 2 (two) times daily. (Patient not taking: Reported on 08/12/2019) 20 tablet 0  ? cyclobenzaprine (FLEXERIL) 10 MG tablet Take 1 tablet (10 mg total) by mouth 2 (two) times daily as needed for muscle spasms. (Patient not taking: Reported on  08/02/2020) 20 tablet 0  ? esomeprazole (NEXIUM) 40 MG capsule Take 1 capsule (40 mg total) by mouth daily at 12 noon. 30 capsule 0  ? Guaifenesin 1200 MG TB12 Take 1 tablet (1,200 mg total) by mouth 2 (two) times daily. (Patient not taking: Reported on 08/12/2019) 20 each 0  ? ibuprofen (ADVIL,MOTRIN) 600 MG tablet Take 1 tablet (600 mg total) by mouth every 6 (six) hours. (Patient not taking: Reported on 01/26/2018) 30 tablet 0  ? naproxen (NAPROSYN) 375 MG tablet Take 1 tablet (375 mg total) by mouth 2 (two) times daily. (Patient not taking: Reported on 08/02/2020) 20 tablet 0  ? nystatin (MYCOSTATIN/NYSTOP) powder Apply 1 application topically 3 (three) times daily. (Patient not taking: Reported on 08/02/2020) 15 g 0  ? Prenatal Multivit-Min-Fe-FA (PRENATAL VITAMINS) 0.8 MG tablet Take 1 tablet by mouth daily. (Patient not taking: Reported on 01/26/2018) 30 tablet 12  ? ranitidine (ZANTAC) 150 MG tablet TAKE 2 TABLETS (300 MG TOTAL) BY MOUTH 2 (TWO) TIMES DAILY. (Patient not taking: Reported on 08/12/2019) 120 tablet 2  ? ?Social History  ? ?Socioeconomic History  ? Marital status: Single  ?  Spouse name: Not on file  ? Number of children: 3  ? Years of education: Not on file  ? Highest education level: Not on file  ?Occupational History  ? Occupation: UNEMPLOYED  ?  Employer: UNEMPLOYED  ?Tobacco Use  ? Smoking status: Every Day  ?  Packs/day: 1.00  ?  Years: 12.00  ?  Pack years: 12.00  ?  Types: Cigarettes  ? Smokeless tobacco: Never  ? Tobacco comments:  ?  trying to quit  ?Vaping Use  ? Vaping Use: Never used  ?Substance and Sexual Activity  ? Alcohol use: Not Currently  ?  Comment: occasionally  ? Drug use: Yes  ?  Types: Marijuana, Cocaine  ? Sexual activity: Yes  ?  Birth control/protection: None  ?Other Topics Concern  ? Not on file  ?Social History Narrative  ? Not on file  ? ?Social Determinants of Health  ? ?Financial Resource Strain: Not on file  ?Food Insecurity: Not on file  ?Transportation Needs: Not on  file  ?Physical Activity: Not on file  ?Stress: Not on file  ?Social Connections: Not on file  ?Intimate Partner Violence: Not on file  ? ?Family History  ?Problem Relation Age of Onset  ? Diabetes Mother   ? Endometriosis Mother   ? Cancer Mother   ?     lung  ? Diabetes Father   ? Heart disease Father   ? Prostate cancer Maternal Grandfather   ? Cancer Maternal Grandfather   ?     prostate  ? Colon polyps Maternal Grandmother   ? Cirrhosis Paternal Grandfather   ? ? ?OBJECTIVE: ? ?Vitals:  ? 07/16/21 1749  ?BP: 117/82  ?Pulse: 97  ?Resp: 16  ?Temp: 98.5 ?F (36.9 ?C)  ?TempSrc: Oral  ?SpO2: 97%  ?  ? ?  Physical Exam ?Vitals and nursing note reviewed.  ?Constitutional:   ?   General: She is not in acute distress. ?   Appearance: Normal appearance. She is normal weight. She is not ill-appearing, toxic-appearing or diaphoretic.  ?HENT:  ?   Head: Normocephalic.  ?Cardiovascular:  ?   Rate and Rhythm: Normal rate and regular rhythm.  ?   Pulses: Normal pulses.  ?   Heart sounds: Normal heart sounds. No murmur heard. ?  No friction rub. No gallop.  ?Pulmonary:  ?   Effort: Pulmonary effort is normal. No respiratory distress.  ?   Breath sounds: Normal breath sounds. No stridor. No wheezing, rhonchi or rales.  ?Chest:  ?   Chest wall: No tenderness.  ?Musculoskeletal:  ?   Right shoulder: Tenderness present. No swelling, deformity, effusion, laceration or bony tenderness. Normal range of motion.  ?   Left shoulder: Normal.  ?   Comments: neurovascular status intact  ?Neurological:  ?   Mental Status: She is alert and oriented to person, place, and time.  ?  ?LABS: ? ?No results found for this or any previous visit (from the past 24 hour(s)).  ? ?ASSESSMENT & PLAN: ? ?1. Acute pain of right shoulder   ? ? ?Meds ordered this encounter  ?Medications  ? acetaminophen (TYLENOL) 500 MG tablet  ?  Sig: Take 1 tablet (500 mg total) by mouth every 6 (six) hours as needed.  ?  Dispense:  30 tablet  ?  Refill:  0  ? traMADol  (ULTRAM) 50 MG tablet  ?  Sig: Take 1 tablet (50 mg total) by mouth every 12 (twelve) hours as needed.  ?  Dispense:  4 tablet  ?  Refill:  0  ? ?Discharge instructions ? ?Take Tylenol 500 mg as needed for mild to

## 2021-07-19 ENCOUNTER — Other Ambulatory Visit: Payer: Self-pay

## 2021-07-19 ENCOUNTER — Encounter (HOSPITAL_COMMUNITY): Payer: Self-pay

## 2021-07-19 ENCOUNTER — Emergency Department (HOSPITAL_COMMUNITY): Payer: Medicare Other

## 2021-07-19 ENCOUNTER — Observation Stay (HOSPITAL_COMMUNITY): Payer: Medicare Other

## 2021-07-19 ENCOUNTER — Observation Stay (HOSPITAL_COMMUNITY)
Admission: EM | Admit: 2021-07-19 | Discharge: 2021-07-20 | Disposition: A | Discharge status: Home / Self Care | Payer: Medicare Other | Attending: Internal Medicine | Admitting: Internal Medicine

## 2021-07-19 DIAGNOSIS — R109 Unspecified abdominal pain: Secondary | ICD-10-CM | POA: Insufficient documentation

## 2021-07-19 DIAGNOSIS — R911 Solitary pulmonary nodule: Secondary | ICD-10-CM | POA: Insufficient documentation

## 2021-07-19 DIAGNOSIS — A419 Sepsis, unspecified organism: Secondary | ICD-10-CM | POA: Diagnosis not present

## 2021-07-19 DIAGNOSIS — J189 Pneumonia, unspecified organism: Principal | ICD-10-CM | POA: Diagnosis present

## 2021-07-19 DIAGNOSIS — E876 Hypokalemia: Secondary | ICD-10-CM | POA: Insufficient documentation

## 2021-07-19 DIAGNOSIS — N189 Chronic kidney disease, unspecified: Secondary | ICD-10-CM | POA: Diagnosis not present

## 2021-07-19 DIAGNOSIS — Z91040 Latex allergy status: Secondary | ICD-10-CM | POA: Diagnosis not present

## 2021-07-19 DIAGNOSIS — D509 Iron deficiency anemia, unspecified: Secondary | ICD-10-CM | POA: Diagnosis not present

## 2021-07-19 DIAGNOSIS — R102 Pelvic and perineal pain: Secondary | ICD-10-CM | POA: Diagnosis present

## 2021-07-19 DIAGNOSIS — F17210 Nicotine dependence, cigarettes, uncomplicated: Secondary | ICD-10-CM | POA: Diagnosis not present

## 2021-07-19 DIAGNOSIS — Z20822 Contact with and (suspected) exposure to covid-19: Secondary | ICD-10-CM | POA: Diagnosis not present

## 2021-07-19 DIAGNOSIS — J45909 Unspecified asthma, uncomplicated: Secondary | ICD-10-CM | POA: Diagnosis not present

## 2021-07-19 LAB — CBC WITH DIFFERENTIAL/PLATELET
Abs Immature Granulocytes: 0.09 10*3/uL — ABNORMAL HIGH (ref 0.00–0.07)
Basophils Absolute: 0 10*3/uL (ref 0.0–0.1)
Basophils Relative: 0 %
Eosinophils Absolute: 0 10*3/uL (ref 0.0–0.5)
Eosinophils Relative: 0 %
HCT: 35 % — ABNORMAL LOW (ref 36.0–46.0)
Hemoglobin: 11.7 g/dL — ABNORMAL LOW (ref 12.0–15.0)
Immature Granulocytes: 1 %
Lymphocytes Relative: 9 %
Lymphs Abs: 1.5 10*3/uL (ref 0.7–4.0)
MCH: 26 pg (ref 26.0–34.0)
MCHC: 33.4 g/dL (ref 30.0–36.0)
MCV: 77.8 fL — ABNORMAL LOW (ref 80.0–100.0)
Monocytes Absolute: 1.1 10*3/uL — ABNORMAL HIGH (ref 0.1–1.0)
Monocytes Relative: 6 %
Neutro Abs: 13.8 10*3/uL — ABNORMAL HIGH (ref 1.7–7.7)
Neutrophils Relative %: 84 %
Platelets: 238 10*3/uL (ref 150–400)
RBC: 4.5 MIL/uL (ref 3.87–5.11)
RDW: 17 % — ABNORMAL HIGH (ref 11.5–15.5)
WBC: 16.5 10*3/uL — ABNORMAL HIGH (ref 4.0–10.5)
nRBC: 0 % (ref 0.0–0.2)

## 2021-07-19 LAB — COMPREHENSIVE METABOLIC PANEL
ALT: 15 U/L (ref 0–44)
AST: 16 U/L (ref 15–41)
Albumin: 3.4 g/dL — ABNORMAL LOW (ref 3.5–5.0)
Alkaline Phosphatase: 65 U/L (ref 38–126)
Anion gap: 11 (ref 5–15)
BUN: 7 mg/dL (ref 6–20)
CO2: 16 mmol/L — ABNORMAL LOW (ref 22–32)
Calcium: 8.7 mg/dL — ABNORMAL LOW (ref 8.9–10.3)
Chloride: 106 mmol/L (ref 98–111)
Creatinine, Ser: 1.09 mg/dL — ABNORMAL HIGH (ref 0.44–1.00)
GFR, Estimated: >60 mL/min (ref >60)
Glucose, Bld: 126 mg/dL — ABNORMAL HIGH (ref 70–99)
Potassium: 2.9 mmol/L — ABNORMAL LOW (ref 3.5–5.1)
Sodium: 133 mmol/L — ABNORMAL LOW (ref 135–145)
Total Bilirubin: 0.4 mg/dL (ref 0.3–1.2)
Total Protein: 7.5 g/dL (ref 6.5–8.1)

## 2021-07-19 LAB — URINALYSIS, ROUTINE W REFLEX MICROSCOPIC
Bacteria, UA: NONE SEEN
Bilirubin Urine: NEGATIVE
Glucose, UA: NEGATIVE mg/dL
Hgb urine dipstick: NEGATIVE
Ketones, ur: 5 mg/dL — AB
Leukocytes,Ua: NEGATIVE
Nitrite: NEGATIVE
Protein, ur: 100 mg/dL — AB
Specific Gravity, Urine: 1.031 — ABNORMAL HIGH (ref 1.005–1.030)
pH: 5 (ref 5.0–8.0)

## 2021-07-19 LAB — LACTIC ACID, PLASMA
Lactic Acid, Venous: 1.4 mmol/L (ref 0.5–1.9)
Lactic Acid, Venous: 1.9 mmol/L (ref 0.5–1.9)

## 2021-07-19 LAB — RESP PANEL BY RT-PCR (FLU A&B, COVID) ARPGX2
Influenza A by PCR: NEGATIVE
Influenza B by PCR: NEGATIVE
SARS Coronavirus 2 by RT PCR: NEGATIVE

## 2021-07-19 LAB — HIV ANTIBODY (ROUTINE TESTING W REFLEX): HIV Screen 4th Generation wRfx: NONREACTIVE

## 2021-07-19 LAB — PREGNANCY, URINE: Preg Test, Ur: NEGATIVE

## 2021-07-19 LAB — D-DIMER, QUANTITATIVE: D-Dimer, Quant: 1.75 ug/mL-FEU — ABNORMAL HIGH (ref 0.00–0.50)

## 2021-07-19 LAB — I-STAT BETA HCG BLOOD, ED (MC, WL, AP ONLY): I-stat hCG, quantitative: 23.9 m[IU]/mL — ABNORMAL HIGH (ref <5)

## 2021-07-19 LAB — MAGNESIUM: Magnesium: 1.6 mg/dL — ABNORMAL LOW (ref 1.7–2.4)

## 2021-07-19 MED ORDER — IOHEXOL 300 MG/ML  SOLN
75.0000 mL | Freq: Once | INTRAMUSCULAR | Status: AC | PRN
  Administered 2021-07-19: 75 mL via INTRAVENOUS

## 2021-07-19 MED ORDER — OXYCODONE-ACETAMINOPHEN 5-325 MG PO TABS
1.0000 | ORAL_TABLET | Freq: Once | ORAL | Status: AC
  Administered 2021-07-19: 1 via ORAL
  Filled 2021-07-19: qty 1

## 2021-07-19 MED ORDER — ALBUTEROL SULFATE (2.5 MG/3ML) 0.083% IN NEBU: 2.5000 mg | INHALATION_SOLUTION | RESPIRATORY_TRACT | Status: DC | PRN

## 2021-07-19 MED ORDER — PANTOPRAZOLE SODIUM 40 MG PO TBEC
40.0000 mg | DELAYED_RELEASE_TABLET | Freq: Every day | ORAL | Status: DC
  Administered 2021-07-19 – 2021-07-20 (×2): 40 mg via ORAL
  Filled 2021-07-19 (×2): qty 1

## 2021-07-19 MED ORDER — MAGNESIUM SULFATE 2 GM/50ML IV SOLN
2.0000 g | Freq: Once | INTRAVENOUS | Status: AC
Start: 2021-07-19 — End: 2021-07-19
  Administered 2021-07-19: 2 g via INTRAVENOUS
  Filled 2021-07-19: qty 50

## 2021-07-19 MED ORDER — SODIUM CHLORIDE 0.9 % IV SOLN
500.0000 mg | Freq: Once | INTRAVENOUS | Status: AC
  Administered 2021-07-19: 500 mg via INTRAVENOUS
  Filled 2021-07-19: qty 5

## 2021-07-19 MED ORDER — ACETAMINOPHEN 325 MG PO TABS
650.0000 mg | ORAL_TABLET | Freq: Four times a day (QID) | ORAL | Status: DC | PRN
  Administered 2021-07-19: 650 mg via ORAL
  Filled 2021-07-19: qty 2

## 2021-07-19 MED ORDER — POTASSIUM CHLORIDE 10 MEQ/100ML IV SOLN
10.0000 meq | Freq: Once | INTRAVENOUS | Status: AC
  Administered 2021-07-19: 10 meq via INTRAVENOUS

## 2021-07-19 MED ORDER — POLYETHYLENE GLYCOL 3350 17 G PO PACK
17.0000 g | PACK | Freq: Every day | ORAL | Status: DC
  Filled 2021-07-19: qty 1

## 2021-07-19 MED ORDER — ACETAMINOPHEN 500 MG PO TABS: 1000.0000 mg | ORAL_TABLET | Freq: Once | ORAL | Status: DC

## 2021-07-19 MED ORDER — SODIUM CHLORIDE 0.9 % IV SOLN
1.0000 g | Freq: Once | INTRAVENOUS | Status: AC
  Administered 2021-07-19: 1 g via INTRAVENOUS
  Filled 2021-07-19: qty 10

## 2021-07-19 MED ORDER — ONDANSETRON HCL 4 MG/2ML IJ SOLN
4.0000 mg | Freq: Once | INTRAMUSCULAR | Status: AC
  Administered 2021-07-19: 4 mg via INTRAVENOUS
  Filled 2021-07-19: qty 2

## 2021-07-19 MED ORDER — IOHEXOL 350 MG/ML SOLN
75.0000 mL | Freq: Once | INTRAVENOUS | Status: AC | PRN
Start: 2021-07-19 — End: 2021-07-19
  Administered 2021-07-19: 75 mL via INTRAVENOUS

## 2021-07-19 MED ORDER — NICOTINE 21 MG/24HR TD PT24
21.0000 mg | MEDICATED_PATCH | Freq: Every day | TRANSDERMAL | Status: DC
  Administered 2021-07-19 – 2021-07-20 (×2): 21 mg via TRANSDERMAL
  Filled 2021-07-19 (×2): qty 1

## 2021-07-19 MED ORDER — SODIUM CHLORIDE 0.9 % IV SOLN
500.0000 mg | INTRAVENOUS | Status: DC
  Administered 2021-07-20: 500 mg via INTRAVENOUS
  Filled 2021-07-19: qty 5

## 2021-07-19 MED ORDER — POTASSIUM CHLORIDE CRYS ER 20 MEQ PO TBCR
40.0000 meq | EXTENDED_RELEASE_TABLET | ORAL | Status: AC
  Administered 2021-07-19 (×2): 40 meq via ORAL
  Filled 2021-07-19 (×2): qty 2

## 2021-07-19 MED ORDER — MORPHINE SULFATE (PF) 4 MG/ML IV SOLN
4.0000 mg | Freq: Once | INTRAVENOUS | Status: AC
  Administered 2021-07-19: 4 mg via INTRAVENOUS
  Filled 2021-07-19: qty 1

## 2021-07-19 MED ORDER — CYCLOBENZAPRINE HCL 5 MG PO TABS: 10.0000 mg | ORAL_TABLET | Freq: Two times a day (BID) | ORAL | Status: DC | PRN

## 2021-07-19 MED ORDER — SODIUM CHLORIDE 0.9 % IV SOLN
2.0000 g | INTRAVENOUS | Status: DC
  Administered 2021-07-20: 2 g via INTRAVENOUS
  Filled 2021-07-19: qty 20

## 2021-07-19 MED ORDER — LACTATED RINGERS IV BOLUS
1000.0000 mL | Freq: Once | INTRAVENOUS | Status: AC
  Administered 2021-07-19: 1000 mL via INTRAVENOUS

## 2021-07-19 MED ORDER — SENNOSIDES-DOCUSATE SODIUM 8.6-50 MG PO TABS: 1.0000 | ORAL_TABLET | Freq: Every day | ORAL | Status: DC

## 2021-07-19 MED ORDER — ENOXAPARIN SODIUM 40 MG/0.4ML IJ SOSY
40.0000 mg | PREFILLED_SYRINGE | Freq: Every day | INTRAMUSCULAR | Status: DC
  Administered 2021-07-19 – 2021-07-20 (×2): 40 mg via SUBCUTANEOUS
  Filled 2021-07-19 (×2): qty 0.4

## 2021-07-19 MED ORDER — LACTATED RINGERS IV BOLUS: 1000.0000 mL | Freq: Once | INTRAVENOUS | Status: DC

## 2021-07-19 MED ORDER — SODIUM CHLORIDE 0.9 % IV BOLUS
1000.0000 mL | Freq: Once | INTRAVENOUS | Status: AC
  Administered 2021-07-19: 1000 mL via INTRAVENOUS

## 2021-07-19 MED ORDER — POTASSIUM CHLORIDE 10 MEQ/100ML IV SOLN
10.0000 meq | Freq: Once | INTRAVENOUS | Status: DC
  Filled 2021-07-19: qty 100

## 2021-07-19 MED ORDER — POTASSIUM CHLORIDE 10 MEQ/100ML IV SOLN
10.0000 meq | Freq: Once | INTRAVENOUS | Status: AC
  Administered 2021-07-19: 10 meq via INTRAVENOUS
  Filled 2021-07-19: qty 100

## 2021-07-19 MED ORDER — LACTATED RINGERS IV SOLN: INTRAVENOUS | Status: DC

## 2021-07-19 MED ORDER — ACETAMINOPHEN 500 MG PO TABS
1000.0000 mg | ORAL_TABLET | Freq: Once | ORAL | Status: AC
  Administered 2021-07-19: 1000 mg via ORAL
  Filled 2021-07-19: qty 2

## 2021-07-19 NOTE — ED Notes (Signed)
Pt crying in pain, requesting pain meds. Pt's temp is 100.2.No active prn tylenol ordered at this time.  MD made aware through secure chat.  ?

## 2021-07-19 NOTE — ED Notes (Signed)
Pt took Tylenol that EMS given her at 1700. Just notified this RN at this time.  ?

## 2021-07-19 NOTE — ED Notes (Signed)
Pt stated she'll just walked out if nothing can be done for her. MD made aware.  ?

## 2021-07-19 NOTE — ED Triage Notes (Signed)
Pt bib [facility] from home with complaints of abdominal pain and fever x3 days. Pt also reports n/v and right arm pain. Pt went to urgent care on Sunday with complaints of arm and shoulder pain. Pt arrives with shob and fever. EMS gave '650mg'$  Tylenol, 155m LR, '4mg'$  zofran en route.  ? ?EMS vitals: 94/72, 120 HR, 34 R, 99.9 T, 100% RA, 124 cbg ?

## 2021-07-19 NOTE — ED Notes (Signed)
Pt remains tachycardic, requesting pain meds again. MD on call made aware through secure chat, stated will call pt to the room shortly to inquiry the pain before any pain meds can be ordered.   ?

## 2021-07-19 NOTE — ED Notes (Addendum)
MD instructed the nurse to administer flexeril for pt's leg pain. Pt refused stated that's what she takes at home , and it doesn't help. This is why she's here.  ?

## 2021-07-19 NOTE — ED Notes (Signed)
Attempted report x3. Told by unit secretary the pt has not been approved yet.  ?

## 2021-07-19 NOTE — ED Notes (Signed)
MD at bedside. 

## 2021-07-19 NOTE — ED Notes (Signed)
Pt transported to XR.  

## 2021-07-19 NOTE — ED Notes (Signed)
Attempted report x 2 

## 2021-07-19 NOTE — H&P (Signed)
? ?NAME:  [name], MRN:  [id number], DOB:  July 09, 1985, LOS: 0 ?ADMISSION DATE:  07/19/2021, Primary: Candace Drafts, FNP  ?CHIEF COMPLAINT:  shortness of breath, myalgias  ? ?Medical Service: Internal Medicine Teaching Service    ?     ?Attending Physician: Dr. Sid Falcon, MD    ?First Contact: Dr. Vinetta Bergamo Pager: [phone]  ?Second Contact: Dr. Lisabeth Devoid Pager: [phone]  ?     ?After Hours (After 5p/  First Contact Pager: [phone]  ?weekends / holidays): Second Contact Pager: [phone]  ? ? ?History of present illness   ?[name] is a 36 year old female with tobacco use who presented to [facility] this morning for 3d history of fevers, chills, nausea, non-bloody emesis, and myalgias. She denied shortness of breath to the ED provider however reported some mild shortness of breath to me. She notes that she has a chronic productive cough that has been slightly worse over the past couple of days. She noted some blood streaks in her expectorated sputum earlier today but no gross hemoptysis or hematemesis.  ?She reports abdominal pain associated with vomiting. She reports poor oral intake due to loss of appetite over the past couple of days. Denies diarrhea. Notes no bowel movement over the past 3 days but has been passing gas. ?No recent sick contacts although does have 3 children. ?She does endorse a history of prior PE and relates it to chronic leukocytosis for which she had seen hematology for in the past. Denies any recent prolonged immobility, surgery, or known malignancy. She did experience a short period of sharp infrascapular pain on Sunday, after picking up a 70# child however this has since resolved. ? ?Past Medical History  ?She,  has a past medical history of Abortion in first trimester (08/2016), Anemia, Anxiety, Arthritis, Asthma, Bipolar 1 disorder ([place]), Cancer ([place]), Cervical cancer ([place]), Chronic kidney disease, Constipation, DDD (degenerative disc disease), cervical, Edema,  Gallstones, GERD (gastroesophageal reflux disease), High cholesterol, History of anemia, History of renal failure, HPV (human papilloma virus) anogenital infection, Leukocytosis, Migraine, Obesity, Obesity, Pelvic inflammatory disease (PID) (05-08-11), PID (pelvic inflammatory disease), PTSD (post-traumatic stress disorder), Sciatica, and Scoliosis.  ? ?Home Medications    ? ?Prior to Admission medications   ?Medication Sig Start Date End Date Taking? Authorizing Provider  ?acetaminophen (TYLENOL) 500 MG tablet Take 1 tablet (500 mg total) by mouth every 6 (six) hours as needed. 07/16/21  Yes Avegno, Darrelyn Hillock, FNP  ?cetirizine (ZYRTEC) 10 MG tablet Take 10 mg by mouth daily. 02/22/21  Yes [provider]  ?cyclobenzaprine (FLEXERIL) 10 MG tablet Take 1 tablet (10 mg total) by mouth 2 (two) times daily as needed for muscle spasms. 08/12/19  Yes Etta Quill, NP  ?esomeprazole (NEXIUM) 40 MG capsule Take 1 capsule (40 mg total) by mouth daily at 12 noon. 08/02/20  Yes Lorin Glass, PA-C  ?ibuprofen (ADVIL,MOTRIN) 600 MG tablet Take 1 tablet (600 mg total) by mouth every 6 (six) hours. 11/17/17  Yes Keitha Butte, CNM  ?nystatin (MYCOSTATIN/NYSTOP) powder Apply 1 application topically 3 (three) times daily. 08/12/19  Yes Etta Quill, NP  ?Guaifenesin 1200 MG TB12 Take 1 tablet (1,200 mg total) by mouth 2 (two) times daily. ?Patient not taking: Reported on 08/12/2019 01/26/18   Dalia Heading, PA-C  ? ? ?Allergies   ? ?Allergies as of 07/19/2021 - Review Complete 07/19/2021  ?Allergen Reaction Noted  ? Banana Itching 12/09/2012  ? Latex Itching and Rash 05/08/2011  ?  Orange fruit [citrus] Other (See Comments) 08/22/2015  ? Miconazole Rash and Other (See Comments) 01/21/2015  ? ? ?Social History  ? reports that she has been smoking cigarettes. She has a 12.00 pack-year smoking history. She has never used smokeless tobacco. She reports that she does not currently use alcohol. She reports current drug  use. Drugs: Marijuana and Cocaine.  ? ?Family History   ?Her family history includes Cancer in her maternal grandfather and mother; Cirrhosis in her paternal grandfather; Colon polyps in her maternal grandmother; Diabetes in her father and mother; Endometriosis in her mother; Heart disease in her father; Prostate cancer in her maternal grandfather.  ? ?Objective   ?Blood pressure (!) 105/92, pulse (!) 119, temperature (!) 102.9 ?F (39.4 ?C), temperature source Oral, resp. rate (!) 21, height 5' 3"  (1.6 m), weight 108.9 kg, SpO2 99 %. ?   ?General: acutely ill appearing female sitting up in bed, non-toxic appearing ?HEENT: dry MM, no adenopathy ?Cardiac: tachycardic rate, regular rhythm, no LE edema ?Pulm: breathing comfortably on room air, diminished slightly on the right, no crackles or wheezing ?GI: soft, non-tender, bs active ?Skin: warm, dry, no rash on limited exam ?MSK: no pain on palpation of the lower extremities.  ?Neuro: a/o x4. ?Significant Diagnostic Tests:  ? ? ?  Latest Ref Rng & Units 07/19/2021  ?  8:24 AM 08/02/2020  ?  3:08 PM 01/11/2018  ?  2:17 AM  ?CBC  ?WBC 4.0 - 10.5 K/uL 16.5   14.3   12.5    ?Hemoglobin 12.0 - 15.0 g/dL 11.7   12.1   10.6    ?Hematocrit 36.0 - 46.0 % 35.0   37.5   31.8    ?Platelets 150 - 400 K/uL 238   372   318    ? ? ?  Latest Ref Rng & Units 07/19/2021  ?  8:24 AM 08/02/2020  ?  3:08 PM 11/19/2016  ? 12:31 AM  ?BMP  ?Glucose 70 - 99 mg/dL 126   88   84    ?BUN 6 - 20 mg/dL 7   8   19     ?Creatinine 0.44 - 1.00 mg/dL 1.09   1.00   1.03    ?Sodium 135 - 145 mmol/L 133   137   137    ?Potassium 3.5 - 5.1 mmol/L 2.9   3.7   3.5    ?Chloride 98 - 111 mmol/L 106   103   108    ?CO2 22 - 32 mmol/L 16   20   24     ?Calcium 8.9 - 10.3 mg/dL 8.7   9.3   8.6    ? ? ?Summary  ?34 yof admitted for sepsis secondary to community acquired pneumonia and hypokalemia.  ? ?Assessment & Plan:  ? ?Sepsis secondary to Community acquired pneumonia, hypokalemia ?Criteria met with Tmax 102.62F,  tachycardia 119, WBC 16.5. no evidence of end organ damage. Lactate 1.4.  ?Low suspicion for bacteremia. ?Hypokalemia secondary to nausea/vomiting. NAGMA possibly from GI losses though she denies diarrhea. ?Used share decision making in decision to admit. Admission favored in light of sepsis physiology and persistent poor oral intake.  ?PE considered, opacity on imaging somewhat wedge shaped, enlarge pulmonary trunk commented on chest CT which could suggest PAH. Well's score intermediate due to tachycardia and history of VTE. ?Plan ?Received a dose of rocephin and azithromycin in the ED. Will continue this for now with plans to transition to oral antibiotics at time of  discharge, to complete a total 5 day course. Recommend TOC for antibiotic delivery due to financial limitations ?Received 2L of IVF in the ED. Encourage oral fluid intake ?Follow blood cultures ?Inspiratory spirometry ?Encourage smoking cessation ?Check D-dimer to r/o PE. If negative, I think her tachycardia could be attributed to her sepsis, so further workup would not be indicated.  ?Check mag, replete potassium, recheck labs in AM ?Will need a follow up PA/Lat CXR in 3-4 weeks to r/o underlying malignancy in the RUL ? ? ?Chronic microcytic anemia. Stable. Check iron studies in AM ? ?Degenerative disk disease. Chronic and stable. Continue flexeril. ? ?Tobacco use disorder. Counseled on cessation. Nicoderm patch ordered. ? ?Obesity class 3. Body mass index is 42.51 kg/m?. ? ?Best practice:  ?CODE STATUS: FULL ?DVT for prophylaxis: lovenox ?Dispo: Admit patient to Observation with expected length of stay less than 2 midnights. ? ? ?Mitzi Hansen, MD ?Internal Medicine Resident PGY-3 ?[name] ?Pager: [phone] ?07/19/2021 1:46 PM  ?  ? ? ? ?

## 2021-07-19 NOTE — ED Notes (Addendum)
1630-Attempted secure chat admitting doc regards of pt's status: tachycardic,hypotension and fever. Did not hear anything back.  ? ?1800- Had secretary paged internal meds.  ?

## 2021-07-19 NOTE — ED Provider Notes (Signed)
?[facility] ?Provider Note ? ? ?CSN: [id number] ?Arrival date & time: 07/19/21  0813 ? ?  ? ?History ? ?Chief Complaint  ?Patient presents with  ? Abdominal Pain  ? Fever  ? ? ?[name] is a 36 y.o. female. ? ?Patient is a 36 year old female with a history of chronic leukocytosis, prior PID, GERD, CKD in the past but not currently, status postcholecystectomy and bipolar disease who is presenting today with multiple complaints.  Patient reports her symptoms all started on Sunday night.  She initially went to urgent care on Sunday because as she was lifting one of her children who weighs approximately 70 pounds she had a sudden pain in her right shoulder and rib cage and went there for evaluation.  She reports while she was there they gave her prescription for tramadol and also gave her Tylenol.  She reports she was never able to pick up the tramadol but on Sunday night she started feeling bad where her whole body started hurting like pins-and-needles.  She felt feverish but she had been taking ibuprofen for the pain in her shoulder.  She also started having nausea.  On Monday and Tuesday she reports having some vomiting, having no appetite, drinking very little and hurting all over.  She reports today more consistently she started having upper abdominal pain but reports it has been intermittent with the fevers over the last 3 days.  She has not had a bowel movement since the symptoms started.  She says she coughs all the time because she smokes and she does have some mucus when she coughs but is not sure that that is different from her baseline.  She denies any shortness of breath.  She did note a little blood-tinged sputum today but denies any hematemesis.  She reports what she is vomiting looks like mucus.  She has no known sick contacts.  She denies any urinary complaints.  Her last menses she reports was last month and she has a tubal ligation.  She denies any  vaginal discharge or discomfort. ? ?The history is provided by the patient.  ?Abdominal Pain ?Associated symptoms: fever   ?Fever ? ?  ? ?Home Medications ?Prior to Admission medications   ?Medication Sig Start Date End Date Taking? Authorizing Provider  ?acetaminophen (TYLENOL) 500 MG tablet Take 1 tablet (500 mg total) by mouth every 6 (six) hours as needed. 07/16/21   Avegno, Darrelyn Hillock, FNP  ?amoxicillin-clavulanate (AUGMENTIN) 875-125 MG tablet Take 1 tablet by mouth 2 (two) times daily. ?Patient not taking: Reported on 08/12/2019 01/26/18   Dalia Heading, PA-C  ?cyclobenzaprine (FLEXERIL) 10 MG tablet Take 1 tablet (10 mg total) by mouth 2 (two) times daily as needed for muscle spasms. ?Patient not taking: Reported on 08/02/2020 08/12/19   Etta Quill, NP  ?esomeprazole (NEXIUM) 40 MG capsule Take 1 capsule (40 mg total) by mouth daily at 12 noon. 08/02/20   Lorin Glass, PA-C  ?Guaifenesin 1200 MG TB12 Take 1 tablet (1,200 mg total) by mouth 2 (two) times daily. ?Patient not taking: Reported on 08/12/2019 01/26/18   Dalia Heading, PA-C  ?ibuprofen (ADVIL,MOTRIN) 600 MG tablet Take 1 tablet (600 mg total) by mouth every 6 (six) hours. ?Patient not taking: Reported on 01/26/2018 11/17/17   Keitha Butte, CNM  ?naproxen (NAPROSYN) 375 MG tablet Take 1 tablet (375 mg total) by mouth 2 (two) times daily. ?Patient not taking: Reported on 08/02/2020 08/12/19   Etta Quill, NP  ?  nystatin (MYCOSTATIN/NYSTOP) powder Apply 1 application topically 3 (three) times daily. ?Patient not taking: Reported on 08/02/2020 08/12/19   Etta Quill, NP  ?Prenatal Multivit-Min-Fe-FA (PRENATAL VITAMINS) 0.8 MG tablet Take 1 tablet by mouth daily. ?Patient not taking: Reported on 01/26/2018 05/29/17   Luvenia Redden, PA-C  ?ranitidine (ZANTAC) 150 MG tablet TAKE 2 TABLETS (300 MG TOTAL) BY MOUTH 2 (TWO) TIMES DAILY. ?Patient not taking: Reported on 08/12/2019 04/18/18   Woodroe Mode, MD  ?traMADol (ULTRAM) 50 MG tablet Take 1  tablet (50 mg total) by mouth every 12 (twelve) hours as needed. 07/16/21   Emerson Monte, FNP  ?   ? ?Allergies    ?Banana, Latex, Orange fruit [citrus], and Miconazole   ? ?Review of Systems   ?Review of Systems  ?Constitutional:  Positive for fever.  ?Gastrointestinal:  Positive for abdominal pain.  ? ?Physical Exam ?Updated Vital Signs ?BP 116/76   Pulse (!) 111   Temp (!) 102.9 ?F (39.4 ?C) (Oral)   Resp (!) 27   Ht '5\' 3"'$  (1.6 m)   Wt 108.9 kg   SpO2 98%   BMI 42.51 kg/m?  ?Physical Exam ?Vitals and nursing note reviewed.  ?Constitutional:   ?   General: She is not in acute distress. ?   Appearance: She is well-developed.  ?HENT:  ?   Head: Normocephalic and atraumatic.  ?   Comments: Multiple dental caries noted and missing teeth ?   Nose: Nose normal.  ?   Mouth/Throat:  ?   Mouth: Mucous membranes are dry.  ?Eyes:  ?   Pupils: Pupils are equal, round, and reactive to light.  ?Cardiovascular:  ?   Rate and Rhythm: Regular rhythm. Tachycardia present.  ?   Heart sounds: Normal heart sounds. No murmur heard. ?  No friction rub.  ?Pulmonary:  ?   Effort: Pulmonary effort is normal.  ?   Breath sounds: Normal breath sounds. No wheezing or rales.  ?Abdominal:  ?   General: Bowel sounds are normal. There is no distension.  ?   Palpations: Abdomen is soft.  ?   Tenderness: There is abdominal tenderness in the epigastric area, periumbilical area and suprapubic area. There is no guarding or rebound. Negative signs include Murphy's sign.  ?Musculoskeletal:     ?   General: No tenderness. Normal range of motion.  ?   Cervical back: Normal range of motion and neck supple.  ?   Right lower leg: No edema.  ?   Left lower leg: No edema.  ?   Comments: No edema  ?Skin: ?   General: Skin is warm and dry.  ?   Findings: No rash.  ?Neurological:  ?   Mental Status: She is alert and oriented to person, place, and time. Mental status is at baseline.  ?   Cranial Nerves: No cranial nerve deficit.  ?Psychiatric:     ?    Mood and Affect: Mood normal.     ?   Behavior: Behavior normal.  ? ? ?ED Results / Procedures / Treatments   ?Labs ?(all labs ordered are listed, but only abnormal results are displayed) ?Labs Reviewed  ?COMPREHENSIVE METABOLIC PANEL - Abnormal; Notable for the following components:  ?    Result Value  ? Sodium 133 (*)   ? Potassium 2.9 (*)   ? CO2 16 (*)   ? Glucose, Bld 126 (*)   ? Creatinine, Ser 1.09 (*)   ? Calcium 8.7 (*)   ?  Albumin 3.4 (*)   ? All other components within normal limits  ?CBC WITH DIFFERENTIAL/PLATELET - Abnormal; Notable for the following components:  ? WBC 16.5 (*)   ? Hemoglobin 11.7 (*)   ? HCT 35.0 (*)   ? MCV 77.8 (*)   ? RDW 17.0 (*)   ? Neutro Abs 13.8 (*)   ? Monocytes Absolute 1.1 (*)   ? Abs Immature Granulocytes 0.09 (*)   ? All other components within normal limits  ?URINALYSIS, ROUTINE W REFLEX MICROSCOPIC - Abnormal; Notable for the following components:  ? Color, Urine AMBER (*)   ? APPearance HAZY (*)   ? Specific Gravity, Urine 1.031 (*)   ? Ketones, ur 5 (*)   ? Protein, ur 100 (*)   ? All other components within normal limits  ?I-STAT BETA HCG BLOOD, ED ([facility], [facility], [facility] ONLY) - Abnormal; Notable for the following components:  ? I-stat hCG, quantitative 23.9 (*)   ? All other components within normal limits  ?RESP PANEL BY RT-PCR (FLU A&B, COVID) [id number]  ?CULTURE, BLOOD (ROUTINE X 2)  ?CULTURE, BLOOD (ROUTINE X 2)  ?LACTIC ACID, PLASMA  ?PREGNANCY, URINE  ?LACTIC ACID, PLASMA  ?PROTIME-INR  ? ? ?EKG ?EKG Interpretation ? ?Date/Time:  Wednesday July 19 2021 08:19:10 EDT ?Ventricular Rate:  113 ?PR Interval:  130 ?QRS Duration: 84 ?QT Interval:  287 ?QTC Calculation: 394 ?R Axis:   89 ?Text Interpretation: Sinus tachycardia No significant change since last tracing Confirmed by Blanchie Dessert [phone]) on 07/19/2021 8:22:22 AM ? ?Radiology ?DG Chest 2 View ? ?Result Date: 07/19/2021 ?CLINICAL DATA:  Suspected sepsis. Abdominal pain and fever for 3 days. Patient also reports nausea  vomiting and right arm pain. EXAM: CHEST - 2 VIEW COMPARISON:  None. FINDINGS: The heart size and mediastinal contours are within normal limits. Right upper lobe peripheral masslike opacity which may represent

## 2021-07-19 NOTE — ED Notes (Signed)
Pt crying in pain. Refused Miralax. No pain meds ordered at this time.  ?

## 2021-07-19 NOTE — ED Notes (Signed)
Pt's temp 101. MD made aware.  ?

## 2021-07-20 ENCOUNTER — Other Ambulatory Visit (HOSPITAL_COMMUNITY): Payer: Self-pay

## 2021-07-20 ENCOUNTER — Encounter (HOSPITAL_COMMUNITY): Payer: Self-pay | Admitting: Internal Medicine

## 2021-07-20 DIAGNOSIS — J189 Pneumonia, unspecified organism: Secondary | ICD-10-CM

## 2021-07-20 LAB — BASIC METABOLIC PANEL
Anion gap: 7 (ref 5–15)
BUN: 7 mg/dL (ref 6–20)
CO2: 18 mmol/L — ABNORMAL LOW (ref 22–32)
Calcium: 8 mg/dL — ABNORMAL LOW (ref 8.9–10.3)
Chloride: 109 mmol/L (ref 98–111)
Creatinine, Ser: 1.03 mg/dL — ABNORMAL HIGH (ref 0.44–1.00)
GFR, Estimated: >60 mL/min (ref >60)
Glucose, Bld: 117 mg/dL — ABNORMAL HIGH (ref 70–99)
Potassium: 3.4 mmol/L — ABNORMAL LOW (ref 3.5–5.1)
Sodium: 134 mmol/L — ABNORMAL LOW (ref 135–145)

## 2021-07-20 LAB — IRON AND TIBC
Iron: 7 ug/dL — ABNORMAL LOW (ref 28–170)
Saturation Ratios: 3 % — ABNORMAL LOW (ref 10.4–31.8)
TIBC: 270 ug/dL (ref 250–450)
UIBC: 263 ug/dL

## 2021-07-20 LAB — CBC
HCT: 30.6 % — ABNORMAL LOW (ref 36.0–46.0)
Hemoglobin: 10.4 g/dL — ABNORMAL LOW (ref 12.0–15.0)
MCH: 26.2 pg (ref 26.0–34.0)
MCHC: 34 g/dL (ref 30.0–36.0)
MCV: 77.1 fL — ABNORMAL LOW (ref 80.0–100.0)
Platelets: 218 10*3/uL (ref 150–400)
RBC: 3.97 MIL/uL (ref 3.87–5.11)
RDW: 17 % — ABNORMAL HIGH (ref 11.5–15.5)
WBC: 13.2 10*3/uL — ABNORMAL HIGH (ref 4.0–10.5)
nRBC: 0 % (ref 0.0–0.2)

## 2021-07-20 LAB — FERRITIN: Ferritin: 165 ng/mL (ref 11–307)

## 2021-07-20 LAB — MAGNESIUM: Magnesium: 2.2 mg/dL (ref 1.7–2.4)

## 2021-07-20 MED ORDER — ACETAMINOPHEN 325 MG PO TABS: 650.0000 mg | ORAL_TABLET | Freq: Four times a day (QID) | ORAL | Status: DC | PRN

## 2021-07-20 MED ORDER — POLY-IRON 150 FORTE 150-25-1 MG-MCG-MG PO CAPS
150.0000 mg | ORAL_CAPSULE | Freq: Every day | ORAL | 0 refills | Status: AC
Start: 2021-07-20 — End: 2021-08-19
  Filled 2021-07-20: qty 30, 30d supply, fill #0

## 2021-07-20 MED ORDER — POTASSIUM CHLORIDE CRYS ER 20 MEQ PO TBCR
40.0000 meq | EXTENDED_RELEASE_TABLET | Freq: Once | ORAL | Status: AC
  Administered 2021-07-20: 40 meq via ORAL
  Filled 2021-07-20: qty 2

## 2021-07-20 MED ORDER — AMOXICILLIN-POT CLAVULANATE 875-125 MG PO TABS
1.0000 | ORAL_TABLET | Freq: Two times a day (BID) | ORAL | 0 refills | Status: AC
  Filled 2021-07-20: qty 6, 3d supply, fill #0

## 2021-07-20 MED ORDER — FLUCONAZOLE 150 MG PO TABS
150.0000 mg | ORAL_TABLET | Freq: Once | ORAL | 0 refills | Status: AC
  Filled 2021-07-20: qty 1, 1d supply, fill #0

## 2021-07-20 MED ORDER — IBUPROFEN 400 MG PO TABS
800.0000 mg | ORAL_TABLET | Freq: Four times a day (QID) | ORAL | Status: DC | PRN
  Administered 2021-07-20: 800 mg via ORAL
  Filled 2021-07-20: qty 2

## 2021-07-20 MED ORDER — AZITHROMYCIN 500 MG PO TABS
500.0000 mg | ORAL_TABLET | Freq: Every day | ORAL | 0 refills | Status: DC
  Filled 2021-07-20: qty 4, 4d supply, fill #0

## 2021-07-20 MED ORDER — AZITHROMYCIN 500 MG PO TABS
500.0000 mg | ORAL_TABLET | Freq: Every day | ORAL | 0 refills | Status: AC
  Filled 2021-07-20: qty 1, 1d supply, fill #0

## 2021-07-20 NOTE — Discharge Summary (Signed)
? ?Name: [name] ?MRN: [id number] ?DOB: 04/22/1985 36 y.o. ?PCP: Vonna Drafts, FNP ? ?Date of Admission: 07/19/2021  8:13 AM ?Date of Discharge: 07/20/2021 ?Attending Physician: Sid Falcon, MD ? ?Discharge Diagnosis: ?1. Community acquired pneumonia ?2. Hypokalemia ?3. Hx yeast infection ?4. 23m nodule R lobe ? ?Discharge Medications: ?Allergies as of 07/20/2021   ? ?   Reactions  ? Banana Itching  ?   Itchy throat  ? Latex Itching, Rash  ? Red spots  ? Orange Fruit [citrus] Other (See Comments)  ? Mouth pain, acid reflux   ? Miconazole Rash, Other (See Comments)  ? Paradoxical effect: increases yeast (also)  ? ?  ? ?  ?Medication List  ?  ? ?STOP taking these medications   ? ?cyclobenzaprine 10 MG tablet ?Commonly known as: FLEXERIL ?  ? ?  ? ?TAKE these medications   ? ?acetaminophen 500 MG tablet ?Commonly known as: TYLENOL ?Take 1 tablet (500 mg total) by mouth every 6 (six) hours as needed. ?  ?amoxicillin-clavulanate 875-125 MG tablet ?Commonly known as: Augmentin ?Take 1 tablet by mouth 2 (two) times daily for 3 days. ?  ?azithromycin 500 MG tablet ?Commonly known as: Zithromax ?Take 1 tablet (500 mg total) by mouth daily for 1 day. ?  ?cetirizine 10 MG tablet ?Commonly known as: ZYRTEC ?Take 10 mg by mouth daily. ?  ?esomeprazole 40 MG capsule ?Commonly known as: NexIUM ?Take 1 capsule (40 mg total) by mouth daily at 12 noon. ?  ?Guaifenesin 1200 MG Tb12 ?Take 1 tablet (1,200 mg total) by mouth 2 (two) times daily. ?  ?ibuprofen 600 MG tablet ?Commonly known as: ADVIL ?Take 1 tablet (600 mg total) by mouth every 6 (six) hours. ?  ?nystatin powder ?Commonly known as: MYCOSTATIN/NYSTOP ?Apply 1 application topically 3 (three) times daily. ?  ?Poly-Iron 150 Forte 150-0.025-1 MG Caps ?Generic drug: Iron Polysacch Cmplx-B12-FA ?Take 1 capsule (150 mg) by mouth daily. ?  ? ?  ? ? ?Disposition and follow-up:   ?[facility]ka [facility]  At the hospital follow up visit please address: ? ?CZDG:LOVFIEPwill need follow-up PA and lateral chest x-ray in 3 to 4 weeks following trial of antibiotic therapy to ensure resolution and exclude underlying malignancy. ?Right middle lobe 4 mm pulmonary nodule: ?Incidental finding on CTA.  Given patient's history of tobacco use patient may benefit from noncontrast CT scan in 12 months for reassessment. ? ?Labs / imaging needed at time of follow-up: None ? ?Pending labs/ test needing follow-up: None ? ?Follow-up Appointments: ? Follow-up Information   ? ? medicaid transportation number. Call.   ?Why: Call a couple of days before your appointment to schedule transportation ?Contact information: ?[phone]? ?  ?  ? ? AVonna Drafts FNP. Schedule an appointment as soon as possible for a visit in 1 week(s).   ?Specialty: Nurse Practitioner ?Contact information: ?[address] ?[place]?[phone]? ? ?  ?  ? ?  ?  ? ?  ? ? ?Hospital Course by Problem List: ? ?Candace YShirin Echeverryis a 36y.o. female with PMHx of Tobacco use, chronic anemia, cervical cancer, GERD, HLD, chronic leukocytosis, chronic MSK concerns, [facility], [facility], myalgias, shortness of breath, cough and was admitted for CAP. ? ?#Community-acquired pneumonia ?Patient presented with shortness of breath and cough.  CTA ruled out PE. On admission, Tmax  102.9 F, tachycardic to 110s, leukocytosis to 16.5 lactate 1.4.  CXR showed right upper lobe peripheral masslike opacity suspicious for pneumonia versus neoplastic process. Patient admitted for CAP with concern for developing sepsis. Patient on room air on admission.  Patient treated with IV ceftriaxone and azithromycin which was converted to p.o. antibiotics on day 2.  Patient had improvement in symptoms, ambulating around the room, adequate p.o. intake, feels overall better.  Denies shortness of breath at rest and on  ambulation.  Amenable to discharge today.  She received 2 days of antibiotics inpatient. Patient will discharge on azithromycin for 1 additional day and Augmentin for 3 additional days.  Patient will need follow-up PA and lateral chest x-ray in 3 to 4 weeks following trial of antibiotic therapy to ensure resolution and exclude underlying malignancy.  Blood culture showed no growth in 24 hours.  Given rapid improvement, low concern for bacteremia. ? ?#Hx recurrent yeast infections ?Patient reports history of yeast infections following antibiotic use.  Patient is concerned she is developing a yeast infection.  Will order single dose of Diflucan for uncomplicated vaginitis at discharge. ? ?#Hypokalemia ?Low potassium in the setting of poor p.o. intake for the last 3 days.  Repleted. ? ?#Tobacco use ?Patient had to be redirected when she wanted to step outside of the hospital to smoke.  Nicotine patch applied. ? ?#Chronic microcytic anemia ?Chronic and stable. Studies consistent with iron deficiency anemia. Discharged on p.o. iron supplement. ? ?#Right middle lobe 4 mm pulmonary nodule ?Incidental finding on CTA.  Given patient's history of tobacco use patient may benefit from noncontrast CT scan in 12 months for reassessment. ? ?Subjective on day of discharge: Patient reports feeling overall better today.  Reports improvement in shortness of breath.  Denies pleuritic chest pain.  Some productive yellow-green sputum this morning.  Has been ambulating up and down the halls without difficulties.  Tolerating p.o. intake.  Eager to go home to her children today. ? ?Discharge Exam:   ?BP 109/78 (BP Location: Left Arm)   Pulse 99   Temp 99 ?F (37.2 ?C) (Oral)   Resp 20   Ht '5\' 3"'$  (1.6 m)   Wt 99.4 kg   SpO2 96%   BMI 38.82 kg/m?  ?Discharge exam: ?General: Well appearing obese Caucasian female, NAD ?HENT: External ears and nares appear normal ?EYES: conjunctiva non-erythematous, no scleral icterus ?CV: Tachycardia,  normal rhythm, no murmurs, rubs, gallops. ?Pulmonary: normal work of breathing on RA, lungs clear to auscultation, creased right-sided breath sounds, occasional rhonchi ?Abdominal: non-distended, soft, non-tender to palpation, normal BS ?Skin: Warm and dry, no rashes or lesions ?Neurological: ?MS: awake, alert and oriented x3, normal speech and fund of knowledge ?Motor: moves all extremities antigravity ?Psych: normal affect ? ? ?Pertinent Labs, Studies, and Procedures:  ? ?  Latest Ref Rng & Units 07/20/2021  ?  7:54 AM 07/19/2021  ?  8:24 AM 08/02/2020  ?  3:08 PM  ?CBC  ?WBC 4.0 - 10.5 K/uL 13.2   16.5   14.3    ?Hemoglobin 12.0 - 15.0 g/dL 10.4   11.7   12.1    ?Hematocrit 36.0 - 46.0 % 30.6   35.0   37.5    ?Platelets 150 - 400 K/uL 218   238   372    ? ? ?  Latest Ref Rng & Units 07/20/2021  ?  7:54 AM 07/19/2021  ?  8:24 AM 08/02/2020  ?  3:08 PM  ?CMP  ?Glucose 70 -  99 mg/dL 117   126   88    ?BUN 6 - 20 mg/dL '7   7   8    '$ ?Creatinine 0.44 - 1.00 mg/dL 1.03   1.09   1.00    ?Sodium 135 - 145 mmol/L 134   133   137    ?Potassium 3.5 - 5.1 mmol/L 3.4   2.9   3.7    ?Chloride 98 - 111 mmol/L 109   106   103    ?CO2 22 - 32 mmol/L '18   16   20    '$ ?Calcium 8.9 - 10.3 mg/dL 8.0   8.7   9.3    ?Total Protein 6.5 - 8.1 g/dL  7.5   7.6    ?Total Bilirubin 0.3 - 1.2 mg/dL  0.4   0.8    ?Alkaline Phos 38 - 126 U/L  65   76    ?AST 15 - 41 U/L  16   27    ?ALT 0 - 44 U/L  15   28    ? ? ?DG Chest 2 View ? ?Result Date: 07/19/2021 ?CLINICAL DATA:  Suspected sepsis. Abdominal pain and fever for 3 days. Patient also reports nausea vomiting and right arm pain. EXAM: CHEST - 2 VIEW COMPARISON:  None. FINDINGS: The heart size and mediastinal contours are within normal limits. Right upper lobe peripheral masslike opacity which may represent pneumonia or pleural/parenchymal mass. No large pleural effusion or pneumothorax. Anterior cervical discectomy and fusion. IMPRESSION: Right upper lobe peripheral masslike opacity which may  represent pneumonia or pleural/parenchymal neoplastic process. Further evaluation with cross-sectional imaging would be helpful. Electronically Signed   By: Keane Police D.O.   On: 07/19/2021 09:04  ? ?CT Chest W Contrast

## 2021-07-20 NOTE — Progress Notes (Signed)
Awaiting for pt's medications to be delivered by [facility] pharmacy then pt can be discharged.  ?

## 2021-07-20 NOTE — TOC Transition Note (Signed)
Transition of Care (TOC) - CM/SW Discharge Note ? ? ?Patient Details  ?Name: Katlin Bortner ?MRN: [id number] ?Date of Birth: 07/28/1985 ? ?Transition of Care (TOC) CM/SW Contact:  ?Cyndi Bender, RN ?Phone Number: ?07/20/2021, 1:32 PM ? ? ?Clinical Narrative:    ?Patient stable for discharge. Patient has transportation home. ?No TOC needs ? ? ?Final next level of care: Home/Self Care ?Barriers to Discharge: Barriers Resolved ? ? ?Patient Goals and CMS Choice ?Patient states their goals for this hospitalization and ongoing recovery are:: return home ?  ?  ? ?Discharge Placement ?  ?           ?  ?  ?  ?  ? ?Discharge Plan and Services ?  ?  ?           ?  ?  ?  ?  ?  ?  ?  ?  ?  ?  ? ?Social Determinants of Health (SDOH) Interventions ?  ? ? ?Readmission Risk Interventions ?   ? View : No data to display.  ?  ?  ?  ? ? ? ? ? ?

## 2021-07-20 NOTE — Progress Notes (Signed)
Pt alert and oriented x4 in no acute distress upon discharge. Pt has taken all belongings. Pt taken down to main entrance for discharge via wheelchair by volunteer services. Pt's significant other will transport home via private vehicle.  ?

## 2021-07-20 NOTE — TOC Initial Note (Signed)
Transition of Care (TOC) - Initial/Assessment Note  ? ? ?Patient Details  ?Name: [name] ?MRN: [id number] ?Date of Birth: 06-30-1985 ? ?Transition of Care (TOC) CM/SW Contact:    ?Cyndi Bender, RN ?Phone Number: ?07/20/2021, 11:10 AM ? ?Clinical Narrative:        ?Spoke to patient regarding transition needs. Patient lives with significant other and three children. Patient stated that sometimes she has trouble getting to appointments if she doesn't;t have gas. Medicaid transportation number placed on AVS. Patient aware. ?TOC will continue to follow for needs ? ?Expected Discharge Plan: Home/Self Care ?Barriers to Discharge: Continued Medical Work up ? ? ?Patient Goals and CMS Choice ?Patient states their goals for this hospitalization and ongoing recovery are:: return home ?  ?  ? ?Expected Discharge Plan and Services ?Expected Discharge Plan: Home/Self Care ?  ?  ?  ?Living arrangements for the past 2 months: [place] ?                ?  ?  ?  ?  ?  ?  ?  ?  ?  ?  ? ?Prior Living Arrangements/Services ?Living arrangements for the past 2 months: [place] ?Lives with:: Significant Other ?Patient language and need for interpreter reviewed:: Yes ?       ?Need for Family Participation in Patient Care: Yes (Comment) ?Care giver support system in place?: Yes (comment) ?  ?Criminal Activity/Legal Involvement Pertinent to Current Situation/Hospitalization: No - Comment as needed ? ?Activities of Daily Living ?Home Assistive Devices/Equipment: None ?ADL Screening (condition at time of admission) ?Patient's cognitive ability adequate to safely complete daily activities?: Yes ?Is the patient deaf or have difficulty hearing?: No ?Does the patient have difficulty seeing, even when wearing glasses/contacts?: No ?Does the patient have difficulty concentrating, remembering, or making decisions?: No ?Patient able to express need for assistance with ADLs?: No ?Does the patient have difficulty  dressing or bathing?: No ?Independently performs ADLs?: Yes (appropriate for developmental age) ?Does the patient have difficulty walking or climbing stairs?: No ?Weakness of Legs: None ?Weakness of Arms/Hands: Right (r/t recent right shoulder injury/strain) ? ?Permission Sought/Granted ?  ?  ?   ?   ?   ?   ? ?Emotional Assessment ?  ?Attitude/Demeanor/Rapport: Engaged ?Affect (typically observed): Accepting ?Orientation: : Oriented to Self, Oriented to Place, Oriented to  Time, Oriented to Situation ?Alcohol / Substance Use: Not Applicable ?Psych Involvement: No (comment) ? ?Admission diagnosis:  CAP (community acquired pneumonia) [J18.9] ?Community acquired pneumonia of right upper lobe of lung [J18.9] ?Sepsis without acute organ dysfunction, due to unspecified organism [facility]) [A41.9] ?Patient Active Problem List  ? Diagnosis Date Noted  ? CAP (community acquired pneumonia) 07/19/2021  ? Vaginal delivery 11/16/2017  ? Anemia in pregnancy 08/30/2017  ? Unwanted fertility 08/23/2017  ? Obesity affecting pregnancy, antepartum 06/19/2017  ? Supervision of high risk pregnancy, antepartum, second trimester 05/29/2017  ? History of preterm delivery, currently pregnant in second trimester 05/29/2017  ? Tobacco use in pregnancy, antepartum, second trimester 05/29/2017  ? Drug use affecting pregnancy in second trimester 05/29/2017  ? History of premature delivery 08/10/2016  ? Bipolar disorder ([place]) 08/07/2016  ? Herniated nucleus pulposus, L4-5 left 11/30/2014  ? Chronic pain syndrome 10/28/2012  ? Neck pain 10/28/2012  ? Back pain 10/28/2012  ? Osteoarthritis 10/28/2012  ? Migraine   ? Asthma   ? ?PCP:  Vonna Drafts, FNP ?Pharmacy:   ?[facility] -  [place], [place] - [address] ?[address] ?[place] [address] ?Phone: [phone] Fax: [phone] ? ?[id number]?Phone:  [phone]: [phone]? ?[id number] [place], [place] [facility] [address]Phone: [phone]: [phone] ? ? ? ?Social Determinants of Health (SDOH) Interventions ?  ? ?Readmission Risk Interventions ?   ? View : No data to display.  ?  ?  ?  ? ? ? ?

## 2021-07-20 NOTE — Progress Notes (Signed)
?  Transition of Care (TOC) Screening Note ? ? ?Patient Details  ?Name: [name] ?Date of Birth: 1985-06-03 ? ? ?Transition of Care (TOC) CM/SW Contact:    ?Cyndi Bender, RN ?Phone Number: ?07/20/2021, 8:49 AM ? ? ? ?Transition of Care Department [facility]) has reviewed patient and no TOC needs have been identified at this time. We will continue to monitor patient advancement through interdisciplinary progression rounds. If new patient transition needs arise, please place a TOC consult. ? ? ?

## 2021-07-20 NOTE — Progress Notes (Signed)
Reviewed discharge paperwork with pt while awaiting TOC meds from pharmacy. Pt verbalized understanding. Pt has requested for 2 doses of Diflucan because she reports the start of vagina irritation, pt stated she has h/o vaginal yeast infections when taking antibiotics. Page internal medicine and informed team of pt's request. Awaiting new orders.  ?

## 2021-07-20 NOTE — Care Management Obs Status (Signed)
MEDICARE OBSERVATION STATUS NOTIFICATION ? ? ?Patient Details  ?Name: [name] ?MRN: [id number] ?Date of Birth: [birth year] ? ? ?Medicare Observation Status Notification Given:    ? ? ? ?Cyndi Bender, RN ?07/20/2021, 11:10 AM ?

## 2021-07-20 NOTE — Progress Notes (Addendum)
S: ?On-call pager paged at 6pm due to blood pressure reading of 87/44 with automatic cuff. Patient assessed at bedside. She reports subjective fever and nausea. She had episode of emesis earlier. She does have pain with respiration and can hear wheezing when she lays down and breathes. She reports taking acetaminophen from home because of not having any to be given by nursing.  ?O: ?Repeat pressure at 110/ 70.  ?On exam no crackles or wheezing appreciated in lung fields throughout.  ?A: ?36 year old with history of tobacco use admitted for community acquired pneumonia.  ?P: ?Repeat blood pressure improved. She was given 1 L of fluid in the ED and pressures are normotensive. I asked patient to not take medications from home as we did not want to unintentionally double up on a medication. She did have elevated temperature with tachycardia and leukocytosis, but no signs of end organ damage with lactate at 1.4 nit meeting criteria for sepsis. Further fluid resuscitation is not indicated at this time.  ?-QT wnl from EKG this morning, 1 time dose of zofran ordered ? ? ?

## 2021-07-24 LAB — CULTURE, BLOOD (ROUTINE X 2)
Culture: NO GROWTH
Culture: NO GROWTH
Special Requests: ADEQUATE

## 2021-11-23 ENCOUNTER — Ambulatory Visit
Admission: RE | Admit: 2021-11-23 | Discharge: 2021-11-23 | Disposition: A | Discharge status: Home / Self Care | Payer: Medicaid Other | Source: Ambulatory Visit | Attending: Nurse Practitioner | Admitting: Nurse Practitioner

## 2021-11-23 ENCOUNTER — Other Ambulatory Visit: Payer: Self-pay | Admitting: Nurse Practitioner

## 2021-11-23 DIAGNOSIS — M545 Low back pain, unspecified: Secondary | ICD-10-CM

## 2021-11-23 DIAGNOSIS — M546 Pain in thoracic spine: Secondary | ICD-10-CM

## 2021-12-19 ENCOUNTER — Encounter (HOSPITAL_COMMUNITY): Payer: Self-pay

## 2021-12-19 ENCOUNTER — Emergency Department (HOSPITAL_COMMUNITY)
Admission: EM | Admit: 2021-12-19 | Discharge: 2021-12-20 | Discharge status: Against Medical Advice | Payer: Medicare Other | Attending: Emergency Medicine | Admitting: Emergency Medicine

## 2021-12-19 DIAGNOSIS — R519 Headache, unspecified: Secondary | ICD-10-CM | POA: Diagnosis not present

## 2021-12-19 DIAGNOSIS — Z5321 Procedure and treatment not carried out due to patient leaving prior to being seen by health care provider: Secondary | ICD-10-CM | POA: Insufficient documentation

## 2021-12-19 NOTE — ED Triage Notes (Signed)
Pt arrived via [facility], from home, c/o headache since 11am today. HX of migraines.

## 2021-12-19 NOTE — ED Provider Triage Note (Signed)
Emergency Medicine Provider Triage Evaluation Note  [name] , a 36 y.o. female  was evaluated in triage.  Pt complains of migraine, hx of the same she reports she is taking a painkiller, some ibuprofen, cold water, water, rested, states that "when I go to [name] to give me "that stupid medicine through my IV ".  Patient is verbally aggressive to staff and myself.  Review of Systems  Positive: headache Negative: Fever, neck pain  Physical Exam  BP (!) 153/139 (BP Location: Right Arm)   Pulse (!) 104   Temp 98.4 F (36.9 C)   Resp 20   SpO2 100%  Gen:   Awake, no distress   Resp:  Normal effort  MSK:   Moves extremities without difficulty  Other:  Swinging arms all around and bent over the sink. Vitals are stable.  Medical Decision Making  Medically screening exam initiated at 6:28 PM.  Appropriate orders placed.  [name] was informed that the remainder of the evaluation will be completed by another provider, this initial triage assessment does not replace that evaluation, and the importance of remaining in the ED until their evaluation is complete.     Candace Fitting, PA-C 12/19/21 1830

## 2022-01-15 ENCOUNTER — Emergency Department (HOSPITAL_COMMUNITY)
Admission: EM | Admit: 2022-01-15 | Discharge: 2022-01-15 | Discharge status: Against Medical Advice | Payer: Medicare Other | Attending: Emergency Medicine | Admitting: Emergency Medicine

## 2022-01-15 ENCOUNTER — Encounter (HOSPITAL_COMMUNITY): Payer: Self-pay | Admitting: Emergency Medicine

## 2022-01-15 ENCOUNTER — Other Ambulatory Visit: Payer: Self-pay

## 2022-01-15 DIAGNOSIS — M549 Dorsalgia, unspecified: Secondary | ICD-10-CM | POA: Diagnosis present

## 2022-01-15 DIAGNOSIS — Z5321 Procedure and treatment not carried out due to patient leaving prior to being seen by health care provider: Secondary | ICD-10-CM | POA: Insufficient documentation

## 2022-01-15 DIAGNOSIS — R350 Frequency of micturition: Secondary | ICD-10-CM | POA: Diagnosis not present

## 2022-01-15 DIAGNOSIS — R600 Localized edema: Secondary | ICD-10-CM | POA: Insufficient documentation

## 2022-01-15 LAB — CBC
HCT: 30.8 % — ABNORMAL LOW (ref 36.0–46.0)
Hemoglobin: 10.1 g/dL — ABNORMAL LOW (ref 12.0–15.0)
MCH: 25.7 pg — ABNORMAL LOW (ref 26.0–34.0)
MCHC: 32.8 g/dL (ref 30.0–36.0)
MCV: 78.4 fL — ABNORMAL LOW (ref 80.0–100.0)
Platelets: 314 10*3/uL (ref 150–400)
RBC: 3.93 MIL/uL (ref 3.87–5.11)
RDW: 16.9 % — ABNORMAL HIGH (ref 11.5–15.5)
WBC: 9.9 10*3/uL (ref 4.0–10.5)
nRBC: 0 % (ref 0.0–0.2)

## 2022-01-15 LAB — COMPREHENSIVE METABOLIC PANEL
ALT: 17 U/L (ref 0–44)
AST: 20 U/L (ref 15–41)
Albumin: 3.4 g/dL — ABNORMAL LOW (ref 3.5–5.0)
Alkaline Phosphatase: 65 U/L (ref 38–126)
Anion gap: 9 (ref 5–15)
BUN: 6 mg/dL (ref 6–20)
CO2: 24 mmol/L (ref 22–32)
Calcium: 8.8 mg/dL — ABNORMAL LOW (ref 8.9–10.3)
Chloride: 105 mmol/L (ref 98–111)
Creatinine, Ser: 0.93 mg/dL (ref 0.44–1.00)
GFR, Estimated: >60 mL/min (ref >60)
Glucose, Bld: 95 mg/dL (ref 70–99)
Potassium: 3.8 mmol/L (ref 3.5–5.1)
Sodium: 138 mmol/L (ref 135–145)
Total Bilirubin: 0.5 mg/dL (ref 0.3–1.2)
Total Protein: 6.8 g/dL (ref 6.5–8.1)

## 2022-01-15 LAB — PREGNANCY, URINE: Preg Test, Ur: NEGATIVE

## 2022-01-15 LAB — URINALYSIS, ROUTINE W REFLEX MICROSCOPIC
Bacteria, UA: NONE SEEN
Bilirubin Urine: NEGATIVE
Glucose, UA: NEGATIVE mg/dL
Ketones, ur: NEGATIVE mg/dL
Leukocytes,Ua: NEGATIVE
Nitrite: NEGATIVE
Protein, ur: NEGATIVE mg/dL
Specific Gravity, Urine: 1.009 (ref 1.005–1.030)
pH: 7 (ref 5.0–8.0)

## 2022-01-15 NOTE — ED Triage Notes (Signed)
Pt c/o bilateral foot pain and swelling. Bruising noted to top of left foot, states she dropped her cell phone on foot. Pt also c/o low back pain.

## 2022-01-15 NOTE — ED Notes (Signed)
Pt states that she is leaving due to wait time.  

## 2022-01-15 NOTE — ED Provider Triage Note (Signed)
Emergency Medicine Provider Triage Evaluation Note  [name] , a 36 y.o. female  was evaluated in triage.  Pt complains of back pain for 2 months states she's had back pain in the past. States she's had BL leg swelling for the past few days. No hx of kidney liver or heart disease. But states she has had an AKI.   Also now endorsing some urinary frequency.    Review of Systems  Positive: Leg swelling, back pain Negative: Fever   Physical Exam  BP 133/77   Pulse 84   Temp 99 F (37.2 C)   Resp 17   SpO2 98%  Gen:   Awake, no distress   Resp:  Normal effort  MSK:   Moves extremities without difficulty  Other:  Several areas of muscular TTP of L paraspinal musculature.   Medical Decision Making  Medically screening exam initiated at 7:27 PM.  Appropriate orders placed.  [name] was informed that the remainder of the evaluation will be completed by another provider, this initial triage assessment does not replace that evaluation, and the importance of remaining in the ED until their evaluation is complete.  Labs, urine.   [name] [place], [place] 01/15/22 1929

## 2022-01-16 ENCOUNTER — Emergency Department (HOSPITAL_COMMUNITY)
Admission: EM | Admit: 2022-01-16 | Discharge: 2022-01-17 | Discharge status: Against Medical Advice | Payer: Medicare Other | Attending: Emergency Medicine | Admitting: Emergency Medicine

## 2022-01-16 ENCOUNTER — Emergency Department (HOSPITAL_COMMUNITY): Payer: Medicare Other

## 2022-01-16 ENCOUNTER — Other Ambulatory Visit: Payer: Self-pay

## 2022-01-16 ENCOUNTER — Encounter (HOSPITAL_COMMUNITY): Payer: Self-pay

## 2022-01-16 DIAGNOSIS — S9031XA Contusion of right foot, initial encounter: Secondary | ICD-10-CM | POA: Insufficient documentation

## 2022-01-16 DIAGNOSIS — Z5321 Procedure and treatment not carried out due to patient leaving prior to being seen by health care provider: Secondary | ICD-10-CM | POA: Insufficient documentation

## 2022-01-16 DIAGNOSIS — R0602 Shortness of breath: Secondary | ICD-10-CM | POA: Diagnosis not present

## 2022-01-16 DIAGNOSIS — R42 Dizziness and giddiness: Secondary | ICD-10-CM | POA: Diagnosis not present

## 2022-01-16 DIAGNOSIS — S9032XA Contusion of left foot, initial encounter: Secondary | ICD-10-CM | POA: Insufficient documentation

## 2022-01-16 DIAGNOSIS — R600 Localized edema: Secondary | ICD-10-CM | POA: Diagnosis not present

## 2022-01-16 DIAGNOSIS — X58XXXA Exposure to other specified factors, initial encounter: Secondary | ICD-10-CM | POA: Insufficient documentation

## 2022-01-16 DIAGNOSIS — R079 Chest pain, unspecified: Secondary | ICD-10-CM | POA: Insufficient documentation

## 2022-01-16 LAB — CBC WITH DIFFERENTIAL/PLATELET
Abs Immature Granulocytes: 0.05 10*3/uL (ref 0.00–0.07)
Basophils Absolute: 0 10*3/uL (ref 0.0–0.1)
Basophils Relative: 0 %
Eosinophils Absolute: 0.1 10*3/uL (ref 0.0–0.5)
Eosinophils Relative: 1 %
HCT: 32.2 % — ABNORMAL LOW (ref 36.0–46.0)
Hemoglobin: 10.4 g/dL — ABNORMAL LOW (ref 12.0–15.0)
Immature Granulocytes: 0 %
Lymphocytes Relative: 34 %
Lymphs Abs: 4.4 10*3/uL — ABNORMAL HIGH (ref 0.7–4.0)
MCH: 25.2 pg — ABNORMAL LOW (ref 26.0–34.0)
MCHC: 32.3 g/dL (ref 30.0–36.0)
MCV: 78.2 fL — ABNORMAL LOW (ref 80.0–100.0)
Monocytes Absolute: 0.9 10*3/uL (ref 0.1–1.0)
Monocytes Relative: 7 %
Neutro Abs: 7.6 10*3/uL (ref 1.7–7.7)
Neutrophils Relative %: 58 %
Platelets: 344 10*3/uL (ref 150–400)
RBC: 4.12 MIL/uL (ref 3.87–5.11)
RDW: 16.8 % — ABNORMAL HIGH (ref 11.5–15.5)
WBC: 13 10*3/uL — ABNORMAL HIGH (ref 4.0–10.5)
nRBC: 0 % (ref 0.0–0.2)

## 2022-01-16 NOTE — ED Provider Triage Note (Signed)
Emergency Medicine Provider Triage Evaluation Note  [name] , a 36 y.o. female  was evaluated in triage.  Pt complains of LE edema, back pain, chest pain.  Hx of chronic back pain but now having chest pain today.  Also reports LE edema ongoing for a few days.  Does feel SOB.  Came yesterday but LWBS due to wait time.  No cardiac hx.  Review of Systems  Positive: Chest pain, back pain, LE edema Negative: fever  Physical Exam  There were no vitals taken for this visit.  Gen:   Awake, no distress   Resp:  Normal effort  MSK:   Moves extremities without difficulty  Other:  Trace edema BLE  Medical Decision Making  Medically screening exam initiated at 11:26 PM.  Appropriate orders placed.  [name] was informed that the remainder of the evaluation will be completed by another provider, this initial triage assessment does not replace that evaluation, and the importance of remaining in the ED until their evaluation is complete.  CP, back pain, LE edema.  Back pain is chronic, CP is new.  LE edema x a few days.  EKG, labs, CXR.

## 2022-01-16 NOTE — ED Triage Notes (Signed)
Pt reports chest pain that radiates to her right arm and breast associated with dizziness. She was here yesterday with c/o bilateral foot pain and swelling with bruising noted to her top foot but she left AMA.

## 2022-01-17 ENCOUNTER — Encounter (HOSPITAL_COMMUNITY): Payer: Self-pay | Admitting: Emergency Medicine

## 2022-01-17 ENCOUNTER — Ambulatory Visit (HOSPITAL_COMMUNITY)
Admission: EM | Admit: 2022-01-17 | Discharge: 2022-01-17 | Disposition: A | Discharge status: Home / Self Care | Payer: Medicare Other | Attending: Family Medicine | Admitting: Family Medicine

## 2022-01-17 ENCOUNTER — Ambulatory Visit (INDEPENDENT_AMBULATORY_CARE_PROVIDER_SITE_OTHER): Payer: Medicare Other

## 2022-01-17 DIAGNOSIS — M79672 Pain in left foot: Secondary | ICD-10-CM

## 2022-01-17 DIAGNOSIS — R600 Localized edema: Secondary | ICD-10-CM | POA: Diagnosis not present

## 2022-01-17 DIAGNOSIS — S9032XA Contusion of left foot, initial encounter: Secondary | ICD-10-CM | POA: Diagnosis not present

## 2022-01-17 LAB — COMPREHENSIVE METABOLIC PANEL
ALT: 21 U/L (ref 0–44)
AST: 24 U/L (ref 15–41)
Albumin: 3.6 g/dL (ref 3.5–5.0)
Alkaline Phosphatase: 67 U/L (ref 38–126)
Anion gap: 15 (ref 5–15)
BUN: 11 mg/dL (ref 6–20)
CO2: 21 mmol/L — ABNORMAL LOW (ref 22–32)
Calcium: 9.2 mg/dL (ref 8.9–10.3)
Chloride: 102 mmol/L (ref 98–111)
Creatinine, Ser: 0.99 mg/dL (ref 0.44–1.00)
GFR, Estimated: >60 mL/min (ref >60)
Glucose, Bld: 80 mg/dL (ref 70–99)
Potassium: 3.8 mmol/L (ref 3.5–5.1)
Sodium: 138 mmol/L (ref 135–145)
Total Bilirubin: 0.3 mg/dL (ref 0.3–1.2)
Total Protein: 6.9 g/dL (ref 6.5–8.1)

## 2022-01-17 LAB — BRAIN NATRIURETIC PEPTIDE: B Natriuretic Peptide: 40 pg/mL (ref 0.0–100.0)

## 2022-01-17 LAB — TROPONIN I (HIGH SENSITIVITY): Troponin I (High Sensitivity): 4 ng/L (ref <18)

## 2022-01-17 LAB — I-STAT BETA HCG BLOOD, ED (MC, WL, AP ONLY): I-stat hCG, quantitative: <5 m[IU]/mL (ref <5)

## 2022-01-17 MED ORDER — FUROSEMIDE 20 MG PO TABS: ORAL_TABLET | ORAL | 0 refills | Status: DC

## 2022-01-17 NOTE — ED Notes (Signed)
Pt went up to registration, gave them her stickers with her pt demographics on them and told them she was leaving.

## 2022-01-17 NOTE — ED Triage Notes (Signed)
Pt reports bilateral leg swelling x 5 days. and left foot pain x 1.5 weeks. States she dropped a phone on her left foot and has had pain and bruising since then.  States she has peripheral edema.

## 2022-01-17 NOTE — Discharge Instructions (Signed)
Swelling happens when fluid collects in small spaces around tissues and organs inside the body. Another word for swelling is "edema." Some common parts of the body where people can have swelling are the lower legs or hands. This typically is worse in the areas of the body that are closest to the ground (because of gravity)  Symptoms of swelling can include puffiness of the skin, which can cause the skin to look stretched and shiny. This often occurs with swelling in the lower legs and can be worse after you sit or stand for a long time.  Treatment of edema includes several components: treatment of the underlying cause (if possible), reducing the amount of salt (sodium) in your diet, and, in many cases, use of a medication called a diuretic to eliminate excess fluid. Using compression stockings and elevating the legs may also be recommended.   You have had labs (blood work) drawn today. We will call you with any significant abnormalities or if there is need to begin or change treatment or pursue further follow up.  You may also review your test results online through MyChart. If you do not have a MyChart account, instructions to sign up should be on your discharge paperwork.  

## 2022-01-18 NOTE — ED Provider Notes (Signed)
[facility]   [id number] 01/17/22 Arrival Time: 1050  ASSESSMENT & PLAN:  1. Bilateral lower extremity edema   2. Left foot pain    Unclear etiology of LE edema.  I have personally viewed the imaging studies ordered this visit. L foot: no fractures appreciated. CXR: no acute abnormalities.    Discharge Instructions      Swelling happens when fluid collects in small spaces around tissues and organs inside the body. Another word for swelling is "edema." Some common parts of the body where people can have swelling are the lower legs or hands. This typically is worse in the areas of the body that are closest to the ground (because of gravity)  Symptoms of swelling can include puffiness of the skin, which can cause the skin to look stretched and shiny. This often occurs with swelling in the lower legs and can be worse after you sit or stand for a long time.  Treatment of edema includes several components: treatment of the underlying cause (if possible), reducing the amount of salt (sodium) in your diet, and, in many cases, use of a medication called a diuretic to eliminate excess fluid. Using compression stockings and elevating the legs may also be recommended.   You have had labs (blood work) drawn today. We will call you with any significant abnormalities or if there is need to begin or change treatment or pursue further follow up.  You may also review your test results online through [facility]. If you do not have a MyChart account, instructions to sign up should be on your discharge paperwork.     No s/s of DVT. ED labs reviewed. No significant new abnormalities compared to her baseline labs that would explain current LE edema. Discussed. Trial of Lasix.  Discharge Medication List as of 01/17/2022  2:53 PM     START taking these medications   Details  furosemide (LASIX) 20 MG tablet Take 1-2 tablets every morning as needed to help with the fluid on your legs., Normal        Recommend:  Follow-up Information     Schedule an appointment as soon as possible for a visit  with Vonna Drafts, FNP.   Specialty: Nurse Practitioner Contact information: [place] [address] [phone]                 Reviewed expectations re: course of current medical issues. Questions answered. Outlined signs and symptoms indicating need for more acute intervention. Understanding verbalized. After Visit Summary given.   SUBJECTIVE: History from: Patient. [name] is a 36 y.o. female. Reports: Pt reports bilateral leg swelling x 5 days. and left foot pain x 1.5 weeks. States she dropped a phone on her left foot and has had pain and bruising since then.  Denies SOB/CP. Distant h/o LE edema. No abd pain. Normal PO intake.  Denies recreational drug use.  OBJECTIVE:  Vitals:   01/17/22 1321  BP: 126/78  Pulse: 80  Resp: 18  Temp: 98.2 F (36.8 C)  TempSrc: Oral  SpO2: 98%    General appearance: alert; no distress Eyes: PERRLA; EOMI; conjunctiva normal HENT: [place]; AT; without nasal congestion Neck: supple  Lungs: speaks full sentences without difficulty; unlabored Extremities: bilateral LE 1+ edema Skin: warm and dry Neurologic: normal gait Psychological: alert and cooperative; normal mood and affect  Labs Reviewed: Results for orders placed or performed during the hospital encounter of 01/16/22  CBC with Differential  Result Value Ref Range   WBC 13.0 (H) 4.0 - 10.5 K/uL   RBC 4.12 3.87 - 5.11 MIL/uL   Hemoglobin 10.4 (L) 12.0 - 15.0 g/dL   HCT 32.2 (L) 36.0 - 46.0 %   MCV 78.2 (L) 80.0 - 100.0 fL   MCH 25.2 (L) 26.0 - 34.0 pg   MCHC 32.3 30.0 - 36.0 g/dL   RDW 16.8 (H) 11.5 - 15.5 %   Platelets 344 150 - 400 K/uL   nRBC 0.0 0.0 - 0.2 %   Neutrophils Relative % 58 %   Neutro Abs 7.6 1.7 - 7.7 K/uL   Lymphocytes Relative 34 %   Lymphs Abs 4.4 (H) 0.7 - 4.0 K/uL   Monocytes Relative 7 %    Monocytes Absolute 0.9 0.1 - 1.0 K/uL   Eosinophils Relative 1 %   Eosinophils Absolute 0.1 0.0 - 0.5 K/uL   Basophils Relative 0 %   Basophils Absolute 0.0 0.0 - 0.1 K/uL   Immature Granulocytes 0 %   Abs Immature Granulocytes 0.05 0.00 - 0.07 K/uL  Comprehensive metabolic panel  Result Value Ref Range   Sodium 138 135 - 145 mmol/L   Potassium 3.8 3.5 - 5.1 mmol/L   Chloride 102 98 - 111 mmol/L   CO2 21 (L) 22 - 32 mmol/L   Glucose, Bld 80 70 - 99 mg/dL   BUN 11 6 - 20 mg/dL   Creatinine, Ser 0.99 0.44 - 1.00 mg/dL   Calcium 9.2 8.9 - 10.3 mg/dL   Total Protein 6.9 6.5 - 8.1 g/dL   Albumin 3.6 3.5 - 5.0 g/dL   AST 24 15 - 41 U/L   ALT 21 0 - 44 U/L   Alkaline Phosphatase 67 38 - 126 U/L   Total Bilirubin 0.3 0.3 - 1.2 mg/dL   GFR, Estimated >60 >60 mL/min   Anion gap 15 5 - 15  Brain natriuretic peptide  Result Value Ref Range   B Natriuretic Peptide 40.0 0.0 - 100.0 pg/mL  I-Stat Beta hCG blood, ED ([facility], [facility], [facility] only)  Result Value Ref Range   I-stat hCG, quantitative <5.0 <5 mIU/mL   Comment 3          Troponin I (High Sensitivity)  Result Value Ref Range   Troponin I (High Sensitivity) 4 <18 ng/L   Labs Reviewed - No data to display  Imaging: DG Foot Complete Left  Result Date: 01/17/2022 CLINICAL DATA:  Injury 10 days ago.  Pain and swelling. EXAM: LEFT FOOT - COMPLETE 3+ VIEW COMPARISON:  None Available. FINDINGS: There is no evidence of fracture or dislocation. There is no evidence of arthropathy or other focal bone abnormality. Soft tissues are unremarkable. IMPRESSION: Negative. Electronically Signed   By: Nelson Chimes M.D.   On: 01/17/2022 13:34   DG Chest 2 View  Result Date: 01/17/2022 CLINICAL DATA:  Chest pain and back pain, initial encounter EXAM: CHEST - 2 VIEW COMPARISON:  07/19/2021 FINDINGS: Cardiac shadow is stable. Lungs are clear bilaterally. No focal infiltrate is seen. Postsurgical changes in the cervical spine are again noted. Previously seen  right upper lobe airspace disease has resolved. IMPRESSION: No active cardiopulmonary disease. Electronically Signed   By: Inez Catalina M.D.   On: 01/17/2022 00:09    Allergies  Allergen Reactions   Banana Itching      Itchy throat   Latex Itching and Rash    Red spots   Bioflavonoids Other (See Comments)   Hydrocodone Hives  Orange Fruit [Citrus] Other (See Comments)    Mouth pain, acid reflux    Miconazole Other (See Comments) and Rash    Paradoxical effect: increases yeast (also)    Past Medical History:  Diagnosis Date   Abortion in first trimester 08/2016   Anemia    Anxiety    Panic attack   Arthritis    Asthma    Bipolar 1 disorder (HCC)    Cancer (HCC)    Cervical cancer ([facility])    Chronic kidney disease    history of kidney shut down  no current problems   Constipation    DDD (degenerative disc disease), cervical    Edema    Gallstones    GERD (gastroesophageal reflux disease)    High cholesterol    History of anemia    History of renal failure    HPV (human papilloma virus) anogenital infection    Leukocytosis    going to hematologist   Migraine    Obesity    Obesity    Pelvic inflammatory disease (PID) 05-08-11   previous hx. .Hx. childbirth x3-NVD   PID (pelvic inflammatory disease)    PTSD (post-traumatic stress disorder)    Sciatica    Scoliosis    Social History   Socioeconomic History   Marital status: Single    Spouse name: Not on file   Number of children: 3   Years of education: Not on file   Highest education level: Not on file  Occupational History   Occupation: UNEMPLOYED    Employer: UNEMPLOYED  Tobacco Use   Smoking status: Every Day    Packs/day: 1.00    Years: 12.00    Total pack years: 12.00    Types: Cigarettes   Smokeless tobacco: Never   Tobacco comments:    trying to quit  Vaping Use   Vaping Use: Never used  Substance and Sexual Activity   Alcohol use: Not Currently    Comment: occasionally   Drug use: Yes     Types: Marijuana, Cocaine   Sexual activity: Yes    Birth control/protection: None  Other Topics Concern   Not on file  Social History Narrative   Not on file   Social Determinants of Health   Financial Resource Strain: Medium Risk (11/15/2017)   Overall Financial Resource Strain (CARDIA)    Difficulty of Paying Living Expenses: Somewhat hard  Food Insecurity: Food Insecurity Present (11/15/2017)   Hunger Vital Sign    Worried About Running Out of Food in the Last Year: Sometimes true    Ran Out of Food in the Last Year: Not on file  Transportation Needs: No Transportation Needs (11/15/2017)   PRAPARE - Hydrologist (Medical): No    Lack of Transportation (Non-Medical): No  Physical Activity: Inactive (11/15/2017)   Exercise Vital Sign    Days of Exercise per Week: 0 days    Minutes of Exercise per Session: 0 min  Stress: No Stress Concern Present (11/15/2017)   [place]    Feeling of Stress : Not at all  Social Connections: Not on file  Intimate Partner Violence: Not At Risk (11/15/2017)   Humiliation, Afraid, Rape, and Kick questionnaire    Fear of Current or Ex-Partner: No    Emotionally Abused: No    Physically Abused: No    Sexually Abused: No   Family History  Problem Relation Age of Onset   Diabetes Mother  Endometriosis Mother    Cancer Mother        lung   Diabetes Father    Heart disease Father    Prostate cancer Maternal Grandfather    Cancer Maternal Grandfather        prostate   Colon polyps Maternal Grandmother    Cirrhosis Paternal Grandfather    Past Surgical History:  Procedure Laterality Date   ABDOMINAL EXPLORATION SURGERY  approx 36 years old   rlq(due to adhesions around ovaries)-appendix and ovaries remain   ANTERIOR CERVICAL DECOMP/DISCECTOMY FUSION N/A 12/09/2012   Procedure: ANTERIOR CERVICAL DECOMPRESSION/DISCECTOMY FUSION STRUCTURAL ALLOGRAFT  TRESTLE PLATE CERVICAL FIVE-SIX,SIX-SEVEN;  Surgeon: Otilio Connors, MD;  Location: [facility];  Service: Neurosurgery;  Laterality: N/A;   BACK SURGERY     CHOLECYSTECTOMY  05/10/2011   Procedure: LAPAROSCOPIC CHOLECYSTECTOMY WITH INTRAOPERATIVE CHOLANGIOGRAM;  Surgeon: Gayland Curry, MD,FACS;  Location: [facility];  Service: General;  Laterality: N/A;   LUMBAR LAMINECTOMY/DECOMPRESSION MICRODISCECTOMY Left 11/30/2014   Procedure: Left lumbar four-five Microdiskectomy;  Surgeon: Kristeen Miss, MD;  Location: [facility];  Service: Neurosurgery;  Laterality: Left;   LUMBAR LAMINECTOMY/DECOMPRESSION MICRODISCECTOMY Left 08/15/2015   Procedure: Left Lumbar four-five Microdiskectomy;  Surgeon: Kristeen Miss, MD;  Location: [facility];  Service: Neurosurgery;  Laterality: Left;   LUMBAR LAMINECTOMY/DECOMPRESSION MICRODISCECTOMY Left 09/28/2016   Procedure: LEFT LUMBAR FIVE -SACRAL ONE Microdiscectomy;  Surgeon: Kristeen Miss, MD;  Location: [facility];  Service: Neurosurgery;  Laterality: Left;   TUBAL LIGATION Bilateral 11/16/2017   Procedure: POST PARTUM TUBAL LIGATION;  Surgeon: Aletha Halim, MD;  Location: [facility];  Service: Gynecology;  Laterality: Bilateral;     Vanessa Kick, MD 01/18/22 [id number]

## 2022-04-28 ENCOUNTER — Emergency Department (HOSPITAL_COMMUNITY)
Admission: EM | Admit: 2022-04-28 | Discharge: 2022-04-28 | Disposition: A | Discharge status: Home / Self Care | Payer: 59 | Attending: Emergency Medicine | Admitting: Emergency Medicine

## 2022-04-28 ENCOUNTER — Emergency Department (HOSPITAL_COMMUNITY): Payer: 59

## 2022-04-28 ENCOUNTER — Other Ambulatory Visit: Payer: Self-pay

## 2022-04-28 ENCOUNTER — Encounter (HOSPITAL_COMMUNITY): Payer: Self-pay | Admitting: Emergency Medicine

## 2022-04-28 DIAGNOSIS — R0981 Nasal congestion: Secondary | ICD-10-CM | POA: Diagnosis not present

## 2022-04-28 DIAGNOSIS — R059 Cough, unspecified: Secondary | ICD-10-CM | POA: Insufficient documentation

## 2022-04-28 DIAGNOSIS — Z91040 Latex allergy status: Secondary | ICD-10-CM | POA: Insufficient documentation

## 2022-04-28 DIAGNOSIS — R072 Precordial pain: Secondary | ICD-10-CM | POA: Insufficient documentation

## 2022-04-28 DIAGNOSIS — Z20822 Contact with and (suspected) exposure to covid-19: Secondary | ICD-10-CM | POA: Insufficient documentation

## 2022-04-28 DIAGNOSIS — R6889 Other general symptoms and signs: Secondary | ICD-10-CM

## 2022-04-28 DIAGNOSIS — R5381 Other malaise: Secondary | ICD-10-CM | POA: Insufficient documentation

## 2022-04-28 DIAGNOSIS — R0602 Shortness of breath: Secondary | ICD-10-CM | POA: Insufficient documentation

## 2022-04-28 DIAGNOSIS — R197 Diarrhea, unspecified: Secondary | ICD-10-CM | POA: Insufficient documentation

## 2022-04-28 DIAGNOSIS — R112 Nausea with vomiting, unspecified: Secondary | ICD-10-CM | POA: Insufficient documentation

## 2022-04-28 LAB — CBC WITH DIFFERENTIAL/PLATELET
Abs Immature Granulocytes: 0.02 10*3/uL (ref 0.00–0.07)
Basophils Absolute: 0 10*3/uL (ref 0.0–0.1)
Basophils Relative: 0 %
Eosinophils Absolute: 0.1 10*3/uL (ref 0.0–0.5)
Eosinophils Relative: 1 %
HCT: 37 % (ref 36.0–46.0)
Hemoglobin: 12.4 g/dL (ref 12.0–15.0)
Immature Granulocytes: 0 %
Lymphocytes Relative: 56 %
Lymphs Abs: 4.1 10*3/uL — ABNORMAL HIGH (ref 0.7–4.0)
MCH: 25 pg — ABNORMAL LOW (ref 26.0–34.0)
MCHC: 33.5 g/dL (ref 30.0–36.0)
MCV: 74.6 fL — ABNORMAL LOW (ref 80.0–100.0)
Monocytes Absolute: 0.6 10*3/uL (ref 0.1–1.0)
Monocytes Relative: 8 %
Neutro Abs: 2.6 10*3/uL (ref 1.7–7.7)
Neutrophils Relative %: 35 %
Platelets: 242 10*3/uL (ref 150–400)
RBC: 4.96 MIL/uL (ref 3.87–5.11)
RDW: 17.7 % — ABNORMAL HIGH (ref 11.5–15.5)
WBC: 7.4 10*3/uL (ref 4.0–10.5)
nRBC: 0 % (ref 0.0–0.2)

## 2022-04-28 LAB — COMPREHENSIVE METABOLIC PANEL
ALT: 115 U/L — ABNORMAL HIGH (ref 0–44)
AST: 75 U/L — ABNORMAL HIGH (ref 15–41)
Albumin: 3.8 g/dL (ref 3.5–5.0)
Alkaline Phosphatase: 66 U/L (ref 38–126)
Anion gap: 11 (ref 5–15)
BUN: 10 mg/dL (ref 6–20)
CO2: 23 mmol/L (ref 22–32)
Calcium: 8.7 mg/dL — ABNORMAL LOW (ref 8.9–10.3)
Chloride: 105 mmol/L (ref 98–111)
Creatinine, Ser: 0.81 mg/dL (ref 0.44–1.00)
GFR, Estimated: >60 mL/min (ref >60)
Glucose, Bld: 87 mg/dL (ref 70–99)
Potassium: 3.3 mmol/L — ABNORMAL LOW (ref 3.5–5.1)
Sodium: 139 mmol/L (ref 135–145)
Total Bilirubin: 0.4 mg/dL (ref 0.3–1.2)
Total Protein: 7 g/dL (ref 6.5–8.1)

## 2022-04-28 LAB — URINALYSIS, ROUTINE W REFLEX MICROSCOPIC
Bilirubin Urine: NEGATIVE
Glucose, UA: NEGATIVE mg/dL
Hgb urine dipstick: NEGATIVE
Ketones, ur: NEGATIVE mg/dL
Leukocytes,Ua: NEGATIVE
Nitrite: NEGATIVE
Protein, ur: NEGATIVE mg/dL
Specific Gravity, Urine: 1.017 (ref 1.005–1.030)
pH: 6 (ref 5.0–8.0)

## 2022-04-28 LAB — I-STAT BETA HCG BLOOD, ED (MC, WL, AP ONLY): I-stat hCG, quantitative: <5 m[IU]/mL (ref <5)

## 2022-04-28 LAB — RESP PANEL BY RT-PCR (RSV, FLU A&B, COVID)  RVPGX2
Influenza A by PCR: NEGATIVE
Influenza B by PCR: NEGATIVE
Resp Syncytial Virus by PCR: NEGATIVE
SARS Coronavirus 2 by RT PCR: NEGATIVE

## 2022-04-28 LAB — LIPASE, BLOOD: Lipase: 33 U/L (ref 11–51)

## 2022-04-28 LAB — TROPONIN I (HIGH SENSITIVITY): Troponin I (High Sensitivity): 5 ng/L (ref <18)

## 2022-04-28 MED ORDER — LOPERAMIDE HCL 2 MG PO CAPS: 2.0000 mg | ORAL_CAPSULE | Freq: Four times a day (QID) | ORAL | 0 refills | Status: DC | PRN

## 2022-04-28 MED ORDER — ONDANSETRON 4 MG PO TBDP
8.0000 mg | ORAL_TABLET | Freq: Once | ORAL | Status: AC
  Administered 2022-04-28: 8 mg via ORAL
  Filled 2022-04-28: qty 2

## 2022-04-28 MED ORDER — ONDANSETRON HCL 4 MG PO TABS: 4.0000 mg | ORAL_TABLET | Freq: Four times a day (QID) | ORAL | 0 refills | Status: DC

## 2022-04-28 NOTE — ED Triage Notes (Addendum)
PT has had all family members with flu and been taking care of them. She reports chest pain x 3 days. She complains of SOB/smoker and general feeling of malaise. Few fevers last week but non recently. She reports nausea and vomited once today. Diarrhea for a week as well.

## 2022-04-28 NOTE — ED Provider Notes (Signed)
[facility] Provider Note   CSN: [id number] Arrival date & time: 04/28/22  1919     History  Chief Complaint  Patient presents with   Influenza   HPI [name] is a 37 y.o. female presenting for flulike symptoms.  States she has had cough congestion and general malaise for 2 weeks.  In the last 3 days she has had some midsternal, reproducible and nonradiating sharp chest pain with shortness of breath. This morning she started to have nausea, vomiting, and diarrhea.  Endorses some general abdominal discomfort.  Has been taking Alka-Seltzer, naproxen, TheraFlu and ginger tea.  States she is also had sick contacts with family members who have had the flu.  Denies calf tenderness, OCP use and recent long trips.   Influenza Presenting symptoms: cough, diarrhea, nausea, shortness of breath and vomiting   Associated symptoms: nasal congestion        Home Medications Prior to Admission medications   Medication Sig Start Date End Date Taking? Authorizing Provider  loperamide (IMODIUM) 2 MG capsule Take 1 capsule (2 mg total) by mouth 4 (four) times daily as needed for diarrhea or loose stools. 04/28/22  Yes Joanette Gula K, PA-C  ondansetron (ZOFRAN) 4 MG tablet Take 1 tablet (4 mg total) by mouth every 6 (six) hours. 04/28/22  Yes Harriet Pho, PA-C  acetaminophen (TYLENOL) 500 MG tablet Take 1 tablet (500 mg total) by mouth every 6 (six) hours as needed. 07/16/21   Avegno, Darrelyn Hillock, FNP  cetirizine (ZYRTEC) 10 MG tablet Take 10 mg by mouth daily. 02/22/21   [provider]  esomeprazole (NEXIUM) 40 MG capsule Take 1 capsule (40 mg total) by mouth daily at 12 noon. 08/02/20   Lorin Glass, PA-C  furosemide (LASIX) 20 MG tablet Take 1-2 tablets every morning as needed to help with the fluid on your legs. 01/17/22   Vanessa Kick, MD  Guaifenesin 1200 MG TB12 Take 1 tablet (1,200 mg total) by mouth 2 (two) times  daily. Patient not taking: Reported on 08/12/2019 01/26/18   Dalia Heading, PA-C  ibuprofen (ADVIL,MOTRIN) 600 MG tablet Take 1 tablet (600 mg total) by mouth every 6 (six) hours. 11/17/17   Keitha Butte, CNM  nystatin (MYCOSTATIN/NYSTOP) powder Apply 1 application topically 3 (three) times daily. 08/12/19   Etta Quill, NP      Allergies    Banana, Latex, Bioflavonoids, Hydrocodone, Orange fruit [citrus], and Miconazole    Review of Systems   Review of Systems  HENT:  Positive for congestion.   Respiratory:  Positive for cough and shortness of breath.   Cardiovascular:  Positive for chest pain.  Gastrointestinal:  Positive for diarrhea, nausea and vomiting.    Physical Exam   Vitals:   04/28/22 1925  BP: 127/89  Pulse: 92  Resp: 20  Temp: 98.6 F (37 C)  SpO2: 99%    CONSTITUTIONAL:  well-appearing, NAD NEURO:  Alert and oriented x 3, CN 3-12 grossly intact EYES:  eyes equal and reactive ENT/NECK:  Supple, no stridor  CARDIO:  regular rate and rhythm, appears well-perfused  PULM:  No respiratory distress, CTAB GI/GU:  non-distended, soft, non-tender MSK/SPINE:  No gross deformities, no edema, moves all extremities  SKIN:  no rash, atraumatic  *Additional and/or pertinent findings included in MDM below   ED Results / Procedures / Treatments   Labs (all labs ordered are listed, but only abnormal results are displayed) Labs Reviewed  COMPREHENSIVE METABOLIC PANEL - Abnormal; Notable for the following components:      Result Value   Potassium 3.3 (*)    Calcium 8.7 (*)    AST 75 (*)    ALT 115 (*)    All other components within normal limits  CBC WITH DIFFERENTIAL/PLATELET - Abnormal; Notable for the following components:   MCV 74.6 (*)    MCH 25.0 (*)    RDW 17.7 (*)    Lymphs Abs 4.1 (*)    All other components within normal limits  RESP PANEL BY RT-PCR (RSV, FLU A&B, COVID)  RVPGX2  URINALYSIS, ROUTINE W REFLEX MICROSCOPIC  LIPASE, BLOOD  I-STAT BETA  HCG BLOOD, ED ([facility], [facility], [facility] ONLY)  TROPONIN I (HIGH SENSITIVITY)  TROPONIN I (HIGH SENSITIVITY)    EKG EKG Interpretation  Date/Time:  Saturday April 28 2022 19:33:22 EST Ventricular Rate:  87 PR Interval:  140 QRS Duration: 84 QT Interval:  354 QTC Calculation: 425 R Axis:   79 Text Interpretation: Normal sinus rhythm Normal ECG When compared with ECG of 16-Jan-2022 23:15, PREVIOUS ECG IS PRESENT Confirmed by Lajean Saver [phone]) on 04/28/2022 8:49:31 PM  Radiology DG Chest 2 View  Result Date: 04/28/2022 CLINICAL DATA:  Chest pain EXAM: CHEST - 2 VIEW COMPARISON:  CXR 01/16/22 FINDINGS: No pleural effusion. No pneumothorax. Normal cardiac and mediastinal contours. No focal airspace opacity. No radiographically apparent displaced rib fractures. Surgical clips in the right upper quadrant. Partially visualized cervical spinal fusion hardware in place. Vertebral body heights are maintained. IMPRESSION: No active cardiopulmonary disease. Electronically Signed   By: Marin Roberts M.D.   On: 04/28/2022 20:10    Procedures Procedures    Medications Ordered in ED Medications  ondansetron (ZOFRAN-ODT) disintegrating tablet 8 mg (has no administration in time range)    ED Course/ Medical Decision Making/ A&P                             Medical Decision Making Amount and/or Complexity of Data Reviewed Labs: ordered. Radiology: ordered.  Risk Prescription drug management.   37 year old female who is well-appearing and HD stable presenting for flulike symptoms, chest pain shortness of breath and nausea vomiting diarrhea.  Exam was unremarkable.  DDx includes URI, ACS, PE, pneumonia, intra-abdominal infection, foodborne illness.  I personally reviewed and interpreted labs which revealed mild hypokalemia  I personally reviewed and interpreted her chest x-ray which revealed no acute cardiopulmonary process  I personally reviewed and interpreted her EKG which revealed normal sinus  rhythm  Regarding her chest pain shortness of breath and URI-like symptoms, could be related to an ongoing viral URI process.  Doubt ACS given that her chest pain is reproducible, EKG and initial troponin was negative.  Doubt PE given patient not tachycardic, nor hypoxic, no calf tenderness, OCP use or recent long trips.  Doubt pneumonia given unremarkable chest x-ray.  Intra-abdominal infection also unlikely given patient not tender, no white count and no fever.  Could be a viral gastroenteritis versus foodborne illness of some kind.  Treated nausea with Zofran.  Sent Zofran and Imodium to her pharmacy per patient request.  Discussed return precautions.  Advised her to follow-up with her PCP if symptoms persisted.         Final Clinical Impression(s) / ED Diagnoses Final diagnoses:  Flu-like symptoms  Nausea vomiting and diarrhea    Rx / DC Orders ED Discharge Orders  Ordered    ondansetron (ZOFRAN) 4 MG tablet  Every 6 hours        04/28/22 2125    loperamide (IMODIUM) 2 MG capsule  4 times daily PRN        04/28/22 2131              Harriet Pho, PA-C 04/28/22 2139    Lajean Saver, MD 04/30/22 1530

## 2022-04-28 NOTE — Discharge Instructions (Addendum)
Evaluation for your symptoms today was overall reassuring.  Respiratory panel was negative for flu, COVID and RSV.  Workup for your chest pain and shortness of breath was also reassuring.  EKG was within normal limits along with your cardiac labs.  Also chest x-ray was reassuring.  For your nausea vomiting and diarrhea have sent Zofran and Imodium to your pharmacy.  Also recommending hydration as tolerated.  If your symptoms persist please follow-up with your PCP.  If you have worsening chest pain, shortness of breath, abdominal pain, bloody stools, bloody urine or bloody vomit or any other concerning symptom please return to the emergency department for further evaluation.

## 2022-08-03 ENCOUNTER — Other Ambulatory Visit: Payer: Self-pay

## 2022-08-03 ENCOUNTER — Emergency Department (HOSPITAL_COMMUNITY): Payer: Medicare HMO

## 2022-08-03 ENCOUNTER — Emergency Department (HOSPITAL_COMMUNITY)
Admission: EM | Admit: 2022-08-03 | Discharge: 2022-08-03 | Disposition: A | Discharge status: Home / Self Care | Payer: Medicare HMO | Attending: Emergency Medicine | Admitting: Emergency Medicine

## 2022-08-03 DIAGNOSIS — Z20822 Contact with and (suspected) exposure to covid-19: Secondary | ICD-10-CM | POA: Insufficient documentation

## 2022-08-03 DIAGNOSIS — J069 Acute upper respiratory infection, unspecified: Secondary | ICD-10-CM | POA: Diagnosis not present

## 2022-08-03 DIAGNOSIS — Z91040 Latex allergy status: Secondary | ICD-10-CM | POA: Diagnosis not present

## 2022-08-03 DIAGNOSIS — R0602 Shortness of breath: Secondary | ICD-10-CM | POA: Diagnosis present

## 2022-08-03 LAB — SARS CORONAVIRUS 2 BY RT PCR: SARS Coronavirus 2 by RT PCR: NEGATIVE

## 2022-08-03 MED ORDER — DROPERIDOL 2.5 MG/ML IJ SOLN
1.2500 mg | Freq: Once | INTRAMUSCULAR | Status: AC
  Administered 2022-08-03: 1.25 mg via INTRAVENOUS
  Filled 2022-08-03: qty 2

## 2022-08-03 MED ORDER — MAGNESIUM SULFATE 2 GM/50ML IV SOLN
2.0000 g | Freq: Once | INTRAVENOUS | Status: AC
  Administered 2022-08-03: 2 g via INTRAVENOUS
  Filled 2022-08-03: qty 50

## 2022-08-03 MED ORDER — KETOROLAC TROMETHAMINE 30 MG/ML IJ SOLN
30.0000 mg | Freq: Once | INTRAMUSCULAR | Status: AC
  Administered 2022-08-03: 30 mg via INTRAVENOUS
  Filled 2022-08-03: qty 1

## 2022-08-03 MED ORDER — IPRATROPIUM BROMIDE 0.03 % NA SOLN: 2.0000 | Freq: Two times a day (BID) | NASAL | 12 refills | Status: DC

## 2022-08-03 MED ORDER — NAPROXEN 375 MG PO TABS: 375.0000 mg | ORAL_TABLET | Freq: Two times a day (BID) | ORAL | 0 refills | Status: DC

## 2022-08-03 NOTE — ED Notes (Signed)
Pt reports URI symptoms without a cough X 4 days. Pt states that she's tried multiple different OTC treatments with no relief. Pt states that she feels like she can't breathe and when she feels like this she usually has a panic attack. Pt reports that she came in to prevent that. Pt reports head pain as well as bilateral nare pain. States that she has had multiple back surgeries and her back pain is exacerbated due to blowing her nose

## 2022-08-03 NOTE — ED Notes (Signed)
Pt sleeping, in no apparent distress. Awakened for discharge process

## 2022-08-03 NOTE — ED Provider Notes (Signed)
[place] EMERGENCY DEPARTMENT AT [facility] Provider Note   CSN: [id number] Arrival date & time: 08/03/22  0231     History  Chief Complaint  Patient presents with   Shortness of Breath    [name] is a 37 y.o. female.  The history is provided by the patient.  URI Presenting symptoms: congestion, ear pain and rhinorrhea   Presenting symptoms: no cough and no fever   Severity:  Moderate Onset quality:  Gradual Duration:  4 days Timing:  Constant Progression:  Worsening Chronicity:  New Relieved by:  Nothing Worsened by:  Nothing Ineffective treatments:  None tried Associated symptoms: no arthralgias, no neck pain and no wheezing   Risk factors: not elderly and no chronic cardiac disease        Home Medications Prior to Admission medications   Medication Sig Start Date End Date Taking? Authorizing Provider  acetaminophen (TYLENOL) 500 MG tablet Take 1 tablet (500 mg total) by mouth every 6 (six) hours as needed. 07/16/21   Avegno, Zachery Dakins, FNP  cetirizine (ZYRTEC) 10 MG tablet Take 10 mg by mouth daily. 02/22/21   [provider]  esomeprazole (NEXIUM) 40 MG capsule Take 1 capsule (40 mg total) by mouth daily at 12 noon. 08/02/20   Cristina Gong, PA-C  furosemide (LASIX) 20 MG tablet Take 1-2 tablets every morning as needed to help with the fluid on your legs. 01/17/22   Mardella Layman, MD  Guaifenesin 1200 MG TB12 Take 1 tablet (1,200 mg total) by mouth 2 (two) times daily. Patient not taking: Reported on 08/12/2019 01/26/18   Charlestine Night, PA-C  ibuprofen (ADVIL,MOTRIN) 600 MG tablet Take 1 tablet (600 mg total) by mouth every 6 (six) hours. 11/17/17   Montez Morita, CNM  loperamide (IMODIUM) 2 MG capsule Take 1 capsule (2 mg total) by mouth 4 (four) times daily as needed for diarrhea or loose stools. 04/28/22   Gareth Eagle, PA-C  nystatin (MYCOSTATIN/NYSTOP) powder Apply 1 application topically 3 (three) times daily.  08/12/19   Felicie Morn, NP  ondansetron (ZOFRAN) 4 MG tablet Take 1 tablet (4 mg total) by mouth every 6 (six) hours. 04/28/22   Gareth Eagle, PA-C      Allergies    Banana, Latex, Bioflavonoids, Hydrocodone, Orange fruit [citrus], and Miconazole    Review of Systems   Review of Systems  Constitutional:  Negative for fever.  HENT:  Positive for congestion, ear pain and rhinorrhea. Negative for trouble swallowing and voice change.   Eyes:  Negative for photophobia.  Respiratory:  Negative for cough, wheezing and stridor.   Cardiovascular:  Negative for chest pain, palpitations and leg swelling.  Musculoskeletal:  Negative for arthralgias and neck pain.  All other systems reviewed and are negative.   Physical Exam Updated Vital Signs BP (!) 140/106 (BP Location: Left Arm)   Pulse 86   Temp 98.8 F (37.1 C)   Resp 14   Ht 5\' 3"  (1.6 m)   Wt 104.3 kg   SpO2 99%   BMI 40.74 kg/m  Physical Exam Vitals and nursing note reviewed.  Constitutional:      General: She is not in acute distress.    Appearance: Normal appearance. She is well-developed.  HENT:     Head: Normocephalic and atraumatic.     Nose: Nose normal.  Eyes:     Pupils: Pupils are equal, round, and reactive to light.  Cardiovascular:     Rate and  Rhythm: Normal rate and regular rhythm.     Pulses: Normal pulses.     Heart sounds: Normal heart sounds.  Pulmonary:     Effort: Pulmonary effort is normal. No respiratory distress.     Breath sounds: Normal breath sounds.  Abdominal:     General: Bowel sounds are normal. There is no distension.     Palpations: Abdomen is soft.     Tenderness: There is no abdominal tenderness. There is no guarding or rebound.  Genitourinary:    Vagina: No vaginal discharge.  Musculoskeletal:        General: Normal range of motion.     Cervical back: Normal range of motion and neck supple.  Skin:    General: Skin is warm and dry.     Capillary Refill: Capillary refill takes  less than 2 seconds.     Findings: No erythema or rash.  Neurological:     General: No focal deficit present.     Mental Status: She is alert and oriented to person, place, and time.     Deep Tendon Reflexes: Reflexes normal.  Psychiatric:        Mood and Affect: Mood normal.     ED Results / Procedures / Treatments   Labs (all labs ordered are listed, but only abnormal results are displayed) Results for orders placed or performed during the hospital encounter of 08/03/22  SARS Coronavirus 2 by RT PCR (hospital order, performed in [address] [facility] hospital lab) *cepheid single result test* Anterior Nasal Swab   Specimen: Anterior Nasal Swab  Result Value Ref Range   SARS Coronavirus 2 by RT PCR NEGATIVE NEGATIVE   DG Chest 2 View  Result Date: 08/03/2022 CLINICAL DATA:  Shortness of breath and body aches. EXAM: CHEST - 2 VIEW COMPARISON:  None Available. FINDINGS: The heart size and mediastinal contours are within normal limits. There is no evidence of an acute infiltrate, pleural effusion or pneumothorax. Radiopaque surgical clips are seen within the right upper quadrant. A radiopaque fusion plate and screws are seen overlying the lower cervical spine. Mild dextroscoliosis of the lower thoracic spine is noted. IMPRESSION: No active cardiopulmonary disease. Electronically Signed   By: Aram Candela M.D.   On: 08/03/2022 03:02     EKG None  Radiology DG Chest 2 View  Result Date: 08/03/2022 CLINICAL DATA:  Shortness of breath and body aches. EXAM: CHEST - 2 VIEW COMPARISON:  None Available. FINDINGS: The heart size and mediastinal contours are within normal limits. There is no evidence of an acute infiltrate, pleural effusion or pneumothorax. Radiopaque surgical clips are seen within the right upper quadrant. A radiopaque fusion plate and screws are seen overlying the lower cervical spine. Mild dextroscoliosis of the lower thoracic spine is noted. IMPRESSION: No active cardiopulmonary  disease. Electronically Signed   By: Aram Candela M.D.   On: 08/03/2022 03:02    Procedures Procedures    Medications Ordered in ED Medications  ketorolac (TORADOL) 30 MG/ML injection 30 mg (30 mg Intravenous Given 08/03/22 0313)  droperidol (INAPSINE) 2.5 MG/ML injection 1.25 mg (1.25 mg Intravenous Given 08/03/22 0353)  magnesium sulfate IVPB 2 g 50 mL (0 g Intravenous Stopped 08/03/22 1610)    ED Course/ Medical Decision Making/ A&P                             Medical Decision Making URI symptoms with headache and feels like she is panicking from  symptoms  Amount and/or Complexity of Data Reviewed Labs: ordered.    Details: Negative covid  Radiology: ordered and independent interpretation performed.    Details: Negative CXR  Risk Prescription drug management. Risk Details: URI. Very well appearing.  No CP, no OCP, no leg pain symptoms are likely due to panic.  Patient is sleeping post medication.  Stable for discharge.  Strict return.      Final Clinical Impression(s) / ED Diagnoses Final diagnoses:  Upper respiratory tract infection, unspecified type   Return for intractable cough, coughing up blood, fevers > 100.4 unrelieved by medication, shortness of breath, intractable vomiting, chest pain, shortness of breath, weakness, numbness, changes in speech, facial asymmetry, abdominal pain, passing out, Inability to tolerate liquids or food, cough, altered mental status or any concerns. No signs of systemic illness or infection. The patient is nontoxic-appearing on exam and vital signs are within normal limits.  I have reviewed the triage vital signs and the nursing notes. Pertinent labs & imaging results that were available during my care of the patient were reviewed by me and considered in my medical decision making (see chart for details). After history, exam, and medical workup I feel the patient has been appropriately medically screened and is safe for discharge home.  Pertinent diagnoses were discussed with the patient. Patient was given return precautions   Rx / DC Orders ED Discharge Orders     None         Pheonix Wisby, MD 08/03/22 [phone]

## 2022-08-03 NOTE — ED Triage Notes (Addendum)
Pt c/o increased SOB x 1 week. Has had to use rescue inhaler more frequently. Endorses nasal congestion and worsening chronic cough. Denies sick exposure. No chest pain. Current every day smoker hx asthma.

## 2022-09-04 ENCOUNTER — Emergency Department (HOSPITAL_COMMUNITY)
Admission: EM | Admit: 2022-09-04 | Discharge: 2022-09-04 | Discharge status: Against Medical Advice | Payer: Medicare HMO | Attending: Emergency Medicine | Admitting: Emergency Medicine

## 2022-09-04 ENCOUNTER — Other Ambulatory Visit: Payer: Self-pay

## 2022-09-04 DIAGNOSIS — K0889 Other specified disorders of teeth and supporting structures: Secondary | ICD-10-CM | POA: Diagnosis present

## 2022-09-04 DIAGNOSIS — Z5321 Procedure and treatment not carried out due to patient leaving prior to being seen by health care provider: Secondary | ICD-10-CM | POA: Diagnosis not present

## 2022-09-04 MED ORDER — OXYCODONE-ACETAMINOPHEN 5-325 MG PO TABS
1.0000 | ORAL_TABLET | Freq: Once | ORAL | Status: AC
  Administered 2022-09-04: 1 via ORAL
  Filled 2022-09-04: qty 1

## 2022-09-04 NOTE — ED Triage Notes (Signed)
Patient reports right low dental pain for several days unrelieved by OTC pain medications .

## 2022-09-04 NOTE — ED Notes (Signed)
Pt handed this tech her pt labels and stated she wanted to leave. Took pt OTF

## 2022-09-05 ENCOUNTER — Emergency Department (HOSPITAL_COMMUNITY)
Admission: EM | Admit: 2022-09-05 | Discharge: 2022-09-06 | Disposition: A | Discharge status: Home / Self Care | Payer: Medicare HMO | Attending: Emergency Medicine | Admitting: Emergency Medicine

## 2022-09-05 ENCOUNTER — Encounter (HOSPITAL_COMMUNITY): Payer: Self-pay

## 2022-09-05 DIAGNOSIS — F17210 Nicotine dependence, cigarettes, uncomplicated: Secondary | ICD-10-CM | POA: Insufficient documentation

## 2022-09-05 DIAGNOSIS — K0889 Other specified disorders of teeth and supporting structures: Secondary | ICD-10-CM | POA: Diagnosis present

## 2022-09-05 DIAGNOSIS — J45909 Unspecified asthma, uncomplicated: Secondary | ICD-10-CM | POA: Insufficient documentation

## 2022-09-05 DIAGNOSIS — Z91040 Latex allergy status: Secondary | ICD-10-CM | POA: Diagnosis not present

## 2022-09-05 DIAGNOSIS — F410 Panic disorder [episodic paroxysmal anxiety] without agoraphobia: Secondary | ICD-10-CM | POA: Insufficient documentation

## 2022-09-05 DIAGNOSIS — Z7951 Long term (current) use of inhaled steroids: Secondary | ICD-10-CM | POA: Diagnosis not present

## 2022-09-05 DIAGNOSIS — Z8541 Personal history of malignant neoplasm of cervix uteri: Secondary | ICD-10-CM | POA: Insufficient documentation

## 2022-09-05 MED ORDER — ACETAMINOPHEN ER 650 MG PO TBCR: 650.0000 mg | EXTENDED_RELEASE_TABLET | Freq: Three times a day (TID) | ORAL | 0 refills | Status: DC | PRN

## 2022-09-05 MED ORDER — OXYCODONE-ACETAMINOPHEN 5-325 MG PO TABS
1.0000 | ORAL_TABLET | Freq: Once | ORAL | Status: AC
  Administered 2022-09-05: 1 via ORAL
  Filled 2022-09-05: qty 1

## 2022-09-05 MED ORDER — BENZOCAINE 10 % MT GEL: 1.0000 | OROMUCOSAL | 0 refills | Status: DC | PRN

## 2022-09-05 MED ORDER — IBUPROFEN 600 MG PO TABS: 600.0000 mg | ORAL_TABLET | Freq: Four times a day (QID) | ORAL | 0 refills | Status: DC | PRN

## 2022-09-05 MED ORDER — AMOXICILLIN-POT CLAVULANATE 875-125 MG PO TABS: 1.0000 | ORAL_TABLET | Freq: Two times a day (BID) | ORAL | 0 refills | Status: DC

## 2022-09-05 MED ORDER — LIDOCAINE VISCOUS HCL 2 % MT SOLN
15.0000 mL | Freq: Once | OROMUCOSAL | Status: AC
  Administered 2022-09-05: 15 mL via OROMUCOSAL
  Filled 2022-09-05: qty 15

## 2022-09-05 NOTE — Discharge Instructions (Addendum)
If they were visit to the emergency department today was overall reassuring.  Will send in medications of Motrin/Tylenol for pain as well as Orajel.  Also sent an antibiotic for treatment of infection.  I have sent in a dental referral as well as attached dental resource guide to your discharge papers to call to set up an appointment as symptoms are likely to recur.  Please not hesitate to return to emergency department for worrisome signs and symptoms we discussed become apparent.

## 2022-09-05 NOTE — ED Triage Notes (Signed)
Patient states she is having dental pain. States she went to [facility] yesterday, waited for 6 hours and left. States she has been taking OTC medication for the pain with no relief. Also states she had a panic attack on the way here due to the pain. Patient took zoloft and klonopin today.

## 2022-09-06 NOTE — ED Provider Notes (Signed)
[place] EMERGENCY DEPARTMENT AT [facility] Provider Note   CSN: [id number] Arrival date & time: 09/05/22  2206     History  Chief Complaint  Patient presents with   Dental Pain   Panic Attack    [name] is a 37 y.o. female.   Dental Pain   37 year old female presents emergency department complaints of right lower dental pain.  Patient states that symptoms been intermittent for the past 1 to 2 weeks.  States she has a known "hole in my tooth" that has been present for the past few years but is not seen dentistry in the outpatient setting for dressing affected tooth.  States the pain acutely became worse over the past 2 to 3 days.  Was at [facility] emergency department yesterday but left without being seen.  States the pain persisted through the morning prompting visit to the emergency department today.  States the pain has been responding to Motrin/Tylenol/Orajel in the outpatient setting but patient recently ran out of Motrin as well as Orajel and is requesting refills.  Denies any fever, difficulty breathing/swallowing.  Past medical history significant for GERD, cervical cancer, bipolar 1 disorder, asthma, PID, hypercholesterolemia, PTSD, obesity, chronic pain syndrome,  Home Medications Prior to Admission medications   Medication Sig Start Date End Date Taking? Authorizing Provider  acetaminophen (TYLENOL 8 HOUR) 650 MG CR tablet Take 1 tablet (650 mg total) by mouth every 8 (eight) hours as needed for pain. 09/05/22  Yes Sherian Maroon A, PA  amoxicillin-clavulanate (AUGMENTIN) 875-125 MG tablet Take 1 tablet by mouth every 12 (twelve) hours. 09/05/22  Yes Sherian Maroon A, PA  benzocaine (ORAJEL) 10 % mucosal gel Use as directed 1 Application in the mouth or throat as needed for mouth pain. 09/05/22  Yes Sherian Maroon A, PA  ibuprofen (ADVIL) 600 MG tablet Take 1 tablet (600 mg total) by mouth every 6 (six) hours as needed. 09/05/22  Yes Sherian Maroon A, PA  cetirizine (ZYRTEC) 10 MG tablet Take 10 mg by mouth daily. 02/22/21   [provider]  esomeprazole (NEXIUM) 40 MG capsule Take 1 capsule (40 mg total) by mouth daily at 12 noon. 08/02/20   Cristina Gong, PA-C  furosemide (LASIX) 20 MG tablet Take 1-2 tablets every morning as needed to help with the fluid on your legs. 01/17/22   Mardella Layman, MD  Guaifenesin 1200 MG TB12 Take 1 tablet (1,200 mg total) by mouth 2 (two) times daily. Patient not taking: Reported on 08/12/2019 01/26/18   Charlestine Night, PA-C  ipratropium (ATROVENT) 0.03 % nasal spray Place 2 sprays into both nostrils every 12 (twelve) hours. 08/03/22   Palumbo, April, MD  loperamide (IMODIUM) 2 MG capsule Take 1 capsule (2 mg total) by mouth 4 (four) times daily as needed for diarrhea or loose stools. 04/28/22   Gareth Eagle, PA-C  naproxen (NAPROSYN) 375 MG tablet Take 1 tablet (375 mg total) by mouth 2 (two) times daily with a meal. 08/03/22   Palumbo, April, MD  nystatin (MYCOSTATIN/NYSTOP) powder Apply 1 application topically 3 (three) times daily. 08/12/19   Felicie Morn, NP  ondansetron (ZOFRAN) 4 MG tablet Take 1 tablet (4 mg total) by mouth every 6 (six) hours. 04/28/22   Gareth Eagle, PA-C      Allergies    Banana, Latex, Bioflavonoids, Hydrocodone, Orange fruit [citrus], and Miconazole    Review of Systems   Review of Systems  All other systems reviewed and are  negative.   Physical Exam Updated Vital Signs BP (!) 116/97 (BP Location: Left Arm)   Pulse 98   Temp 99.1 F (37.3 C) (Oral)   Resp (!) 24   SpO2 100%  Physical Exam Vitals and nursing note reviewed.  Constitutional:      General: She is not in acute distress.    Appearance: She is well-developed.  HENT:     Head: Normocephalic and atraumatic.     Mouth/Throat:      Comments: No posterior pharyngeal erythema.  Uvula midline rise metrical phonation.  Tonsils 01+ bilateral with no obvious exudate.  No sublingual or  submandibular swelling appreciated.  Affected tooth as indicated above with appreciable dental carry.  Gingival tenderness to palpation without obvious swelling, palpable fluctuance.  Patient with multiple missing teeth as well as diffuse caries.  No trismus. Eyes:     Conjunctiva/sclera: Conjunctivae normal.  Cardiovascular:     Rate and Rhythm: Normal rate and regular rhythm.     Heart sounds: No murmur heard. Pulmonary:     Effort: Pulmonary effort is normal. No respiratory distress.     Breath sounds: Normal breath sounds.  Abdominal:     Palpations: Abdomen is soft.     Tenderness: There is no abdominal tenderness.  Musculoskeletal:        General: No swelling.     Cervical back: Neck supple.  Skin:    General: Skin is warm and dry.     Capillary Refill: Capillary refill takes less than 2 seconds.  Neurological:     Mental Status: She is alert.  Psychiatric:        Mood and Affect: Mood normal.     ED Results / Procedures / Treatments   Labs (all labs ordered are listed, but only abnormal results are displayed) Labs Reviewed - No data to display  EKG None  Radiology No results found.  Procedures Procedures    Medications Ordered in ED Medications  oxyCODONE-acetaminophen (PERCOCET/ROXICET) 5-325 MG per tablet 1 tablet (1 tablet Oral Given 09/05/22 2349)  lidocaine (XYLOCAINE) 2 % viscous mouth solution 15 mL (15 mLs Mouth/Throat Given 09/05/22 2349)    ED Course/ Medical Decision Making/ A&P                             Medical Decision Making Risk OTC drugs. Prescription drug management.   This patient presents to the ED for concern of dental pain, this involves an extensive number of treatment options, and is a complaint that carries with it a high risk of complications and morbidity.  The differential diagnosis includes dental carry, fracture, periapical abscess, peritonsillar abscess, Ludwig angina, Lemierre's disease   Co morbidities that complicate  the patient evaluation  See HPI   Additional history obtained:  Additional history obtained from EMR External records from outside source obtained and reviewed including hospital records   Lab Tests:  N/a   Imaging Studies ordered:  N/a   Cardiac Monitoring: / EKG:  The patient was maintained on a cardiac monitor.  I personally viewed and interpreted the cardiac monitored which showed an underlying rhythm of: Sinus rhythm   Consultations Obtained:  N/a   Problem List / ED Course / Critical interventions / Medication management  Dentalgia I ordered medication including percocet, lidocaine  Reevaluation of the patient after these medicines showed that the patient improved I have reviewed the patients home medicines and have made adjustments as needed  Social Determinants of Health:  Chronic cigarette use.  Denies illicit drug use.   Test / Admission - Considered:  Dentalgia Vitals signs within normal range and stable throughout visit. 37 year old female presents emergency department complaints of dental pain.  Patient with evidence of dental caries diffusely with gingival tenderness without evidence of abscess.  No clinical indication for Ludwig angina.  Will treat with antibiotics, continue pain control with Tylenol/Motrin and recommend follow-up with dentistry in the outpatient setting.  Dental referral made while in the ED as well as given resources for dentistry in the outpatient setting.  Treatment plan discussed at length with patient and she acknowledged understanding was agreeable to said plan.  Patient overall well-appearing, afebrile in no acute distress, tolerating p.o. without difficulty. Worrisome signs and symptoms were discussed with the patient, and the patient acknowledged understanding to return to the ED if noticed. Patient was stable upon discharge.          Final Clinical Impression(s) / ED Diagnoses Final diagnoses:  Pain, dental     Rx / DC Orders ED Discharge Orders          Ordered    ibuprofen (ADVIL) 600 MG tablet  Every 6 hours PRN        09/05/22 2356    benzocaine (ORAJEL) 10 % mucosal gel  As needed        09/05/22 2356    acetaminophen (TYLENOL 8 HOUR) 650 MG CR tablet  Every 8 hours PRN        09/05/22 2356    amoxicillin-clavulanate (AUGMENTIN) 875-125 MG tablet  Every 12 hours        09/05/22 2356    Ambulatory referral to Dentistry        09/05/22 2357              Peter Garter, [place] 09/06/22 1610    Zadie Rhine, MD 09/06/22 [phone]

## 2022-09-19 ENCOUNTER — Other Ambulatory Visit: Payer: Self-pay

## 2022-09-19 ENCOUNTER — Encounter (HOSPITAL_COMMUNITY): Payer: Self-pay | Admitting: *Deleted

## 2022-09-19 ENCOUNTER — Emergency Department (HOSPITAL_COMMUNITY)
Admission: EM | Admit: 2022-09-19 | Discharge: 2022-09-20 | Disposition: A | Discharge status: Home / Self Care | Payer: Medicare HMO | Attending: Emergency Medicine | Admitting: Emergency Medicine

## 2022-09-19 DIAGNOSIS — F17210 Nicotine dependence, cigarettes, uncomplicated: Secondary | ICD-10-CM | POA: Diagnosis not present

## 2022-09-19 DIAGNOSIS — J45909 Unspecified asthma, uncomplicated: Secondary | ICD-10-CM | POA: Diagnosis not present

## 2022-09-19 DIAGNOSIS — Z8541 Personal history of malignant neoplasm of cervix uteri: Secondary | ICD-10-CM | POA: Diagnosis not present

## 2022-09-19 DIAGNOSIS — N189 Chronic kidney disease, unspecified: Secondary | ICD-10-CM | POA: Insufficient documentation

## 2022-09-19 DIAGNOSIS — R519 Headache, unspecified: Secondary | ICD-10-CM | POA: Insufficient documentation

## 2022-09-19 NOTE — ED Triage Notes (Addendum)
c 

## 2022-09-19 NOTE — ED Triage Notes (Signed)
Headache burning sensation in her neck and head since last Thursday face and head has been hurting since Monday  lmp  2 weeks ago

## 2022-09-20 ENCOUNTER — Emergency Department (HOSPITAL_COMMUNITY): Payer: Medicare HMO

## 2022-09-20 DIAGNOSIS — R519 Headache, unspecified: Secondary | ICD-10-CM | POA: Diagnosis not present

## 2022-09-20 MED ORDER — SODIUM CHLORIDE 0.9 % IV BOLUS
1000.0000 mL | Freq: Once | INTRAVENOUS | Status: AC
  Administered 2022-09-20: 1000 mL via INTRAVENOUS

## 2022-09-20 MED ORDER — KETOROLAC TROMETHAMINE 15 MG/ML IJ SOLN
15.0000 mg | Freq: Once | INTRAMUSCULAR | Status: AC
  Administered 2022-09-20: 15 mg via INTRAVENOUS
  Filled 2022-09-20: qty 1

## 2022-09-20 MED ORDER — PROCHLORPERAZINE EDISYLATE 10 MG/2ML IJ SOLN
10.0000 mg | Freq: Once | INTRAMUSCULAR | Status: AC
  Administered 2022-09-20: 10 mg via INTRAVENOUS
  Filled 2022-09-20: qty 2

## 2022-09-20 MED ORDER — DIPHENHYDRAMINE HCL 50 MG/ML IJ SOLN
25.0000 mg | Freq: Once | INTRAMUSCULAR | Status: AC
  Administered 2022-09-20: 25 mg via INTRAVENOUS
  Filled 2022-09-20: qty 1

## 2022-09-20 NOTE — ED Provider Notes (Signed)
[facility] Emergency Department Provider Note MRN:  [id number]  Arrival date & time: 09/20/22     Chief Complaint   Headache   History of Present Illness   [name] is a 37 y.o. year-old female with a history of migraines, bipolar disorder presenting to the ED with chief complaint of headache.  Pain in the sinuses and behind the eyes as well as pain to the right trapezius muscle.  Described as a burning pain.  This headache is similar to patient's typical migraine but is a bit different.  Denies numbness or weakness to the arms or legs, no fever, no midline neck or back pain.  No trauma.  Review of Systems  A thorough review of systems was obtained and all systems are negative except as noted in the HPI and PMH.   Patient's Health History    Past Medical History:  Diagnosis Date   Abortion in first trimester 08/2016   Anemia    Anxiety    Panic attack   Arthritis    Asthma    Bipolar 1 disorder (HCC)    Cancer (HCC)    Cervical cancer (HCC)    Chronic kidney disease    history of kidney shut down  no current problems   Constipation    DDD (degenerative disc disease), cervical    Edema    Gallstones    GERD (gastroesophageal reflux disease)    High cholesterol    History of anemia    History of renal failure    HPV (human papilloma virus) anogenital infection    Leukocytosis    going to hematologist   Migraine    Obesity    Obesity    Pelvic inflammatory disease (PID) 05-08-11   previous hx. .Hx. childbirth x3-NVD   PID (pelvic inflammatory disease)    PTSD (post-traumatic stress disorder)    Sciatica    Scoliosis     Past Surgical History:  Procedure Laterality Date   ABDOMINAL EXPLORATION SURGERY  approx 37 years old   rlq(due to adhesions around ovaries)-appendix and ovaries remain   ANTERIOR CERVICAL DECOMP/DISCECTOMY FUSION N/A 12/09/2012   Procedure: ANTERIOR CERVICAL DECOMPRESSION/DISCECTOMY FUSION STRUCTURAL  ALLOGRAFT TRESTLE PLATE CERVICAL FIVE-SIX,SIX-SEVEN;  Surgeon: Clydene Fake, MD;  [facility]: [facility];  Service: Neurosurgery;  Laterality: N/A;   BACK SURGERY     CHOLECYSTECTOMY  05/10/2011   Procedure: LAPAROSCOPIC CHOLECYSTECTOMY WITH INTRAOPERATIVE CHOLANGIOGRAM;  Surgeon: Atilano Ina, MD,FACS;  Location: [facility];  Service: General;  Laterality: N/A;   LUMBAR LAMINECTOMY/DECOMPRESSION MICRODISCECTOMY Left 11/30/2014   Procedure: Left lumbar four-five Microdiskectomy;  Surgeon: Barnett Abu, MD;  [facility];  Service: Neurosurgery;  Laterality: Left;   LUMBAR LAMINECTOMY/DECOMPRESSION MICRODISCECTOMY Left 08/15/2015   Procedure: Left Lumbar four-five Microdiskectomy;  Surgeon: Barnett Abu, MD;  [facility];  Service: Neurosurgery;  Laterality: Left;   LUMBAR LAMINECTOMY/DECOMPRESSION MICRODISCECTOMY Left 09/28/2016   Procedure: LEFT LUMBAR FIVE -SACRAL ONE Microdiscectomy;  Surgeon: Barnett Abu, MD;  Location: [facility];  Service: Neurosurgery;  Laterality: Left;   TUBAL LIGATION Bilateral 11/16/2017   Procedure: POST PARTUM TUBAL LIGATION;  Surgeon: [place] Bing, MD;  Location: [facility];  Service: Gynecology;  Laterality: Bilateral;    Family History  Problem Relation Age of Onset   Diabetes Mother    Endometriosis Mother    Cancer Mother        lung   Diabetes Father    Heart disease Father  Prostate cancer Maternal Grandfather    Cancer Maternal Grandfather        prostate   Colon polyps Maternal Grandmother    Cirrhosis Paternal Grandfather     Social History   Socioeconomic History   Marital status: Single    Spouse name: Not on file   Number of children: 3   Years of education: Not on file   Highest education level: Not on file  Occupational History   Occupation: UNEMPLOYED    Employer: UNEMPLOYED  Tobacco Use   Smoking status: Every Day    Packs/day: 1.00    Years: 12.00    Additional pack years: 0.00    Total pack years:  12.00    Types: Cigarettes   Smokeless tobacco: Never   Tobacco comments:    trying to quit  Vaping Use   Vaping Use: Never used  Substance and Sexual Activity   Alcohol use: Not Currently    Comment: occasionally   Drug use: Yes    Types: Marijuana, Cocaine   Sexual activity: Yes    Birth control/protection: None  Other Topics Concern   Not on file  Social History Narrative   Not on file   Social Determinants of Health   Financial Resource Strain: Medium Risk (11/15/2017)   Overall Financial Resource Strain (CARDIA)    Difficulty of Paying Living Expenses: Somewhat hard  Food Insecurity: Food Insecurity Present (11/15/2017)   Hunger Vital Sign    Worried About Running Out of Food in the Last Year: Sometimes true    Ran Out of Food in the Last Year: Not on file  Transportation Needs: No Transportation Needs (11/15/2017)   PRAPARE - Administrator, Civil Service (Medical): No    Lack of Transportation (Non-Medical): No  Physical Activity: Inactive (11/15/2017)   Exercise Vital Sign    Days of Exercise per Week: 0 days    Minutes of Exercise per Session: 0 min  Stress: No Stress Concern Present (11/15/2017)   [employer] of Occupational Health - Occupational Stress Questionnaire    Feeling of Stress : Not at all  Social Connections: Not on file  Intimate Partner Violence: Not At Risk (11/15/2017)   Humiliation, Afraid, Rape, and Kick questionnaire    Fear of Current or Ex-Partner: No    Emotionally Abused: No    Physically Abused: No    Sexually Abused: No     Physical Exam   Vitals:   09/19/22 2114 09/19/22 2326  BP: (!) 132/103 (!) 141/84  Pulse: 99 90  Resp: 20 18  Temp: 99.6 F (37.6 C) 97.6 F (36.4 C)  SpO2: 95% 100%    CONSTITUTIONAL: Well-appearing, NAD NEURO/PSYCH:  Alert and oriented x 3, normal and symmetric strength and sensation, normal coordination, normal speech EYES:  eyes equal and reactive ENT/NECK:  no LAD, no JVD CARDIO:  Regular rate, well-perfused, normal S1 and S2 PULM:  CTAB no wheezing or rhonchi GI/GU:  non-distended, non-tender MSK/SPINE:  No gross deformities, no edema SKIN:  no rash, atraumatic   *Additional and/or pertinent findings included in MDM below  Diagnostic and Interventional Summary    EKG Interpretation  Date/Time:    Ventricular Rate:    PR Interval:    QRS Duration:   QT Interval:    QTC Calculation:   R Axis:     Text Interpretation:         Labs Reviewed - No data to display  CT HEAD WO CONTRAST ( )  Final Result      Medications  ketorolac (TORADOL) 15 MG/ML injection 15 mg (15 mg Intravenous Given 09/20/22 0051)  diphenhydrAMINE (BENADRYL) injection 25 mg (25 mg Intravenous Given 09/20/22 0047)  prochlorperazine (COMPAZINE) injection 10 mg (10 mg Intravenous Given 09/20/22 0048)  sodium chloride 0.9 % bolus 1,000 mL (1,000 mLs Intravenous New Bag/Given 09/20/22 0055)     Procedures  /  Critical Care Procedures  ED Course and Medical Decision Making  Initial Impression and Ddx Suspect migraine versus cervicogenic headache versus sinus infection.  CT head to evaluate for intracranial mass, bleeding given the atypical nature of the headache compared to her normal pancreas.  Past medical/surgical history that increases complexity of ED encounter: Migraines  Interpretation of Diagnostics CT head is without acute or emergent process.  Patient Reassessment and Ultimate Disposition/Management     Patient feeling a lot better, continued reassuring vital signs and neurological exam, appropriate for discharge.  Patient management required discussion with the following services or consulting groups:  None  Complexity of Problems Addressed Acute illness or injury that poses threat of life of bodily function  Additional Data Reviewed and Analyzed Further history obtained from: Prior labs/imaging results  Additional Factors Impacting ED Encounter  Risk None  Elmer Sow. Pilar Plate, MD [facility] Emergency [facility] [email or web address]  Final Clinical Impressions(s) / ED Diagnoses     ICD-10-CM   1. Acute nonintractable headache, unspecified headache type  R51.9       ED Discharge Orders     None        Discharge Instructions Discussed with and Provided to Patient:     Discharge Instructions      You were evaluated in the Emergency Department and after careful evaluation, we did not find any emergent condition requiring admission or further testing in the hospital.  Your exam/testing today is overall reassuring.  Symptoms likely due to a muscle strain or spasm triggering a migraine.  Continue your home medications as needed.  Follow-up with your regular doctor.  Please return to the Emergency Department if you experience any worsening of your condition.   Thank you for allowing [place] to be a part of your care.       Sabas Sous, MD 09/20/22 [phone]

## 2022-09-20 NOTE — Discharge Instructions (Signed)
You were evaluated in the Emergency Department and after careful evaluation, we did not find any emergent condition requiring admission or further testing in the hospital.  Your exam/testing today is overall reassuring.  Symptoms likely due to a muscle strain or spasm triggering a migraine.  Continue your home medications as needed.  Follow-up with your regular doctor.  Please return to the Emergency Department if you experience any worsening of your condition.   Thank you for allowing [place] to be a part of your care.

## 2022-09-20 NOTE — ED Notes (Signed)
Patient transported to CT 

## 2022-12-29 ENCOUNTER — Ambulatory Visit (HOSPITAL_COMMUNITY)
Admission: EM | Admit: 2022-12-29 | Discharge: 2022-12-29 | Disposition: A | Discharge status: Home / Self Care | Payer: Medicare HMO | Attending: Family Medicine | Admitting: Family Medicine

## 2022-12-29 ENCOUNTER — Encounter (HOSPITAL_COMMUNITY): Payer: Self-pay

## 2022-12-29 DIAGNOSIS — J0100 Acute maxillary sinusitis, unspecified: Secondary | ICD-10-CM

## 2022-12-29 DIAGNOSIS — H66003 Acute suppurative otitis media without spontaneous rupture of ear drum, bilateral: Secondary | ICD-10-CM

## 2022-12-29 DIAGNOSIS — R112 Nausea with vomiting, unspecified: Secondary | ICD-10-CM

## 2022-12-29 LAB — POCT URINALYSIS DIP (MANUAL ENTRY)
Blood, UA: NEGATIVE
Glucose, UA: NEGATIVE mg/dL
Ketones, POC UA: NEGATIVE mg/dL
Leukocytes, UA: NEGATIVE
Nitrite, UA: NEGATIVE
Protein Ur, POC: 30 mg/dL — AB
Spec Grav, UA: 1.025 (ref 1.010–1.025)
Urobilinogen, UA: 0.2 U/dL
pH, UA: 5.5 (ref 5.0–8.0)

## 2022-12-29 LAB — POCT URINE PREGNANCY: Preg Test, Ur: NEGATIVE

## 2022-12-29 MED ORDER — KETOROLAC TROMETHAMINE 60 MG/2ML IM SOLN
60.0000 mg | Freq: Once | INTRAMUSCULAR | Status: AC
  Administered 2022-12-29: 60 mg via INTRAMUSCULAR

## 2022-12-29 MED ORDER — KETOROLAC TROMETHAMINE 60 MG/2ML IM SOLN
INTRAMUSCULAR | Status: AC
  Filled 2022-12-29: qty 2

## 2022-12-29 MED ORDER — CEFDINIR 300 MG PO CAPS: 300.0000 mg | ORAL_CAPSULE | Freq: Two times a day (BID) | ORAL | 0 refills | Status: DC

## 2022-12-29 MED ORDER — ONDANSETRON 4 MG PO TBDP
ORAL_TABLET | ORAL | Status: AC
  Filled 2022-12-29: qty 1

## 2022-12-29 MED ORDER — ONDANSETRON 4 MG PO TBDP
4.0000 mg | ORAL_TABLET | Freq: Once | ORAL | Status: AC
  Administered 2022-12-29: 4 mg via ORAL

## 2022-12-29 MED ORDER — ONDANSETRON 4 MG PO TBDP: 4.0000 mg | ORAL_TABLET | Freq: Three times a day (TID) | ORAL | 0 refills | Status: DC | PRN

## 2022-12-29 NOTE — Discharge Instructions (Addendum)
Please do your best to ensure adequate fluid intake in order to avoid dehydration. If you find that you are unable to tolerate drinking fluids regularly please proceed to the Emergency Department for evaluation.  Meds ordered this encounter  Medications   ondansetron (ZOFRAN-ODT) disintegrating tablet 4 mg   ketorolac (TORADOL) injection 60 mg   ondansetron (ZOFRAN-ODT) 4 MG disintegrating tablet    Sig: Take 1 tablet (4 mg total) by mouth every 8 (eight) hours as needed for nausea or vomiting.    Dispense:  15 tablet    Refill:  0   cefdinir (OMNICEF) 300 MG capsule    Sig: Take 1 capsule (300 mg total) by mouth 2 (two) times daily.    Dispense:  20 capsule    Refill:  0

## 2022-12-29 NOTE — ED Triage Notes (Signed)
Pt states that she has bilateral ear pain, facial pain, headache and sore throat. X1 week

## 2022-12-31 NOTE — ED Provider Notes (Signed)
[facility]   [id number] 12/29/22 Arrival Time: 1035  ASSESSMENT & PLAN:  1. Acute non-recurrent maxillary sinusitis   2. Non-recurrent acute suppurative otitis media of both ears without spontaneous rupture of tympanic membranes   3. Nausea and vomiting, unspecified vomiting type    Tolerating PO intake. Meds ordered this encounter  Medications   ondansetron (ZOFRAN-ODT) disintegrating tablet 4 mg   ketorolac (TORADOL) injection 60 mg   ondansetron (ZOFRAN-ODT) 4 MG disintegrating tablet    Sig: Take 1 tablet (4 mg total) by mouth every 8 (eight) hours as needed for nausea or vomiting.    Dispense:  15 tablet    Refill:  0   cefdinir (OMNICEF) 300 MG capsule    Sig: Take 1 capsule (300 mg total) by mouth 2 (two) times daily.    Dispense:  20 capsule    Refill:  0   OTC symptom care as needed. Ensure adequate fluid intake and rest.   Follow-up Information     Schedule an appointment as soon as possible for a visit  with Diamantina Providence, FNP.   Specialty: Nurse Practitioner Why: For follow up. Contact information: [address] [place] [address] [phone]         [facility] [place].   Specialty: Urgent Care Why: If worsening or failing to improve as anticipated. Contact information: [address] [place] [address] [phone]                Reviewed expectations re: course of current medical issues. Questions answered. Outlined signs and symptoms indicating need for more acute intervention. Patient verbalized understanding. After Visit Summary given.   SUBJECTIVE: History from: patient.  [name] is a 37 y.o. female who presents with complaint of nasal congestion, post-nasal drainage, and sinus pain; x 2 weeks. Now ears hurt. Started with nausea this morning; one emesis here. Denies abd pain. Ambulatory here. Unsure if any fever during past two weeks.  Social  History   Tobacco Use  Smoking Status Every Day   Current packs/day: 1.00   Average packs/day: 1 pack/day for 12.0 years (12.0 ttl pk-yrs)   Types: Cigarettes  Smokeless Tobacco Never  Tobacco Comments   trying to quit    OBJECTIVE:  Vitals:   12/29/22 1118 12/29/22 1123  BP:  124/87  Pulse:  87  Resp:  17  Temp:  98 F (36.7 C)  TempSrc:  Oral  SpO2:  99%  Height: 5\' 2"  (1.575 m)     General appearance: alert; no distress HEENT: nasal congestion; clear runny nose; throat irritation secondary to post-nasal drainage; bilateral maxillary tenderness to palpation; turbinates boggy; throat with cobblestoning; bilat TMs with erythema/bulding Neck: supple without LAD; trachea midline Lungs: unlabored respirations, symmetrical air entry; cough: absent; no respiratory distress Skin: warm and dry Psychological: alert and cooperative; normal mood and affect  Allergies  Allergen Reactions   Banana Itching      Itchy throat   Latex Itching and Rash    Red spots   Bioflavonoids Other (See Comments)   Hydrocodone Hives   Orange Fruit [Citrus] Other (See Comments)    Mouth pain, acid reflux    Miconazole Other (See Comments) and Rash    Paradoxical effect: increases yeast (also)    Past Medical History:  Diagnosis Date   Abortion in first trimester 08/2016   Anemia    Anxiety    Panic attack   Arthritis  Asthma    Bipolar 1 disorder (HCC)    Cancer (HCC)    Cervical cancer (HCC)    Chronic kidney disease    history of kidney shut down  no current problems   Constipation    DDD (degenerative disc disease), cervical    Edema    Gallstones    GERD (gastroesophageal reflux disease)    High cholesterol    History of anemia    History of renal failure    HPV (human papilloma virus) anogenital infection    Leukocytosis    going to hematologist   Migraine    Obesity    Obesity    Pelvic inflammatory disease (PID) 05-08-11   previous hx. .Hx. childbirth x3-NVD   PID  (pelvic inflammatory disease)    PTSD (post-traumatic stress disorder)    Sciatica    Scoliosis    Family History  Problem Relation Age of Onset   Diabetes Mother    Endometriosis Mother    Cancer Mother        lung   Diabetes Father    Heart disease Father    Prostate cancer Maternal Grandfather    Cancer Maternal Grandfather        prostate   Colon polyps Maternal Grandmother    Cirrhosis Paternal Grandfather    Social History   Socioeconomic History   Marital status: Single    Spouse name: Not on file   Number of children: 3   Years of education: Not on file   Highest education level: Not on file  Occupational History   Occupation: UNEMPLOYED    Employer: UNEMPLOYED  Tobacco Use   Smoking status: Every Day    Current packs/day: 1.00    Average packs/day: 1 pack/day for 12.0 years (12.0 ttl pk-yrs)    Types: Cigarettes   Smokeless tobacco: Never   Tobacco comments:    trying to quit  Vaping Use   Vaping status: Never Used  Substance and Sexual Activity   Alcohol use: Not Currently    Comment: occasionally   Drug use: Yes    Types: Marijuana, Cocaine   Sexual activity: Yes    Birth control/protection: None  Other Topics Concern   Not on file  Social History Narrative   Not on file   Social Determinants of Health   Financial Resource Strain: Medium Risk (11/15/2017)   Overall Financial Resource Strain (CARDIA)    Difficulty of Paying Living Expenses: Somewhat hard  Food Insecurity: Food Insecurity Present (11/15/2017)   Hunger Vital Sign    Worried About Running Out of Food in the Last Year: Sometimes true    Ran Out of Food in the Last Year: Not on file  Transportation Needs: No Transportation Needs (11/15/2017)   PRAPARE - Administrator, Civil Service (Medical): No    Lack of Transportation (Non-Medical): No  Physical Activity: Inactive (11/15/2017)   Exercise Vital Sign    Days of Exercise per Week: 0 days    Minutes of Exercise per  Session: 0 min  Stress: No Stress Concern Present (11/15/2017)   [employer] of Occupational Health - Occupational Stress Questionnaire    Feeling of Stress : Not at all  Social Connections: Not on file  Intimate Partner Violence: Not At Risk (11/15/2017)   Humiliation, Afraid, Rape, and Kick questionnaire    Fear of Current or Ex-Partner: No    Emotionally Abused: No    Physically Abused: No    Sexually Abused:  No             Mardella Layman, MD 12/31/22 [phone]

## 2023-02-19 ENCOUNTER — Ambulatory Visit (HOSPITAL_COMMUNITY)
Admission: EM | Admit: 2023-02-19 | Discharge: 2023-02-19 | Disposition: A | Discharge status: Home / Self Care | Payer: Medicare HMO

## 2023-02-19 ENCOUNTER — Encounter (HOSPITAL_COMMUNITY): Payer: Self-pay | Admitting: *Deleted

## 2023-02-19 DIAGNOSIS — M546 Pain in thoracic spine: Secondary | ICD-10-CM

## 2023-02-19 MED ORDER — TIZANIDINE HCL 4 MG PO TABS: 4.0000 mg | ORAL_TABLET | Freq: Three times a day (TID) | ORAL | 0 refills | Status: DC | PRN

## 2023-02-19 MED ORDER — DEXAMETHASONE SODIUM PHOSPHATE 10 MG/ML IJ SOLN
10.0000 mg | Freq: Once | INTRAMUSCULAR | Status: AC
  Administered 2023-02-19: 10 mg via INTRAMUSCULAR

## 2023-02-19 MED ORDER — DEXAMETHASONE SODIUM PHOSPHATE 10 MG/ML IJ SOLN
INTRAMUSCULAR | Status: AC
  Filled 2023-02-19: qty 1

## 2023-02-19 NOTE — ED Triage Notes (Signed)
Pt states she is having back pain across the middle of her back X 3 days. She states that it hurts when she takes a deep breath or moves. She has cough, congestion. She is out of her inhaler and states she usually has some antibiotics on standby but she is out right now. She said she doesn't ever take them all so she has extra. She took a at home COVID test and it was neg.    She states she has anxiety attacks and she has been out of the meds for about a month.   She states her pain is so bad she can not stand and walk, pt is in wheelchair.

## 2023-02-19 NOTE — Discharge Instructions (Signed)
We gave you an injection of steroid medicine today to help with the pain in your back.  In addition, you can take Tylenol 500 to 1000 mg every 6 hours for pain.  Also recommend using the muscle relaxant every 8 hours as needed for muscle spasms.  Do not take this with other sedating medication or prior to operating or driving heavy machinery as it may cause drowsiness.  Seek care in emergency room if pain does not improve with treatment.

## 2023-02-19 NOTE — ED Provider Notes (Signed)
[facility]    CSN: [id number] Arrival date & time: 02/19/23  1947      History   Chief Complaint Chief Complaint  Patient presents with   Back Pain   Shortness of Breath   Cough   Nasal Congestion    HPI [name] is a 37 y.o. female.   Patient presents for back pain that started gradually a couple of days ago.  Denies recent fall, trauma, accident, or injury involving the back.  Reports the pain is moderate to severe and is a sharp pain, worse whenever she moves.  Denies recent new cough or congestion.  No fevers, sore throat, runny or stuffy nose.  Denies radiation of pain.  No swelling or bruising that she knows of.  Denies numbness or tingling going down the arms or legs, saddle anesthesia, bowel or bladder incontinence, nausea/vomiting, decree sensation or weakness of lower extremities.  She reports is very painful to move and therefore she is having a tough time walking, however she was able to drive to urgent care today.     Past Medical History:  Diagnosis Date   Abortion in first trimester 08/2016   Anemia    Anxiety    Panic attack   Arthritis    Asthma    Bipolar 1 disorder (HCC)    Cancer (HCC)    Cervical cancer (HCC)    Chronic kidney disease    history of kidney shut down  no current problems   Constipation    DDD (degenerative disc disease), cervical    Edema    Gallstones    GERD (gastroesophageal reflux disease)    High cholesterol    History of anemia    History of renal failure    HPV (human papilloma virus) anogenital infection    Leukocytosis    going to hematologist   Migraine    Obesity    Obesity    Pelvic inflammatory disease (PID) 05-08-11   previous hx. .Hx. childbirth x3-NVD   PID (pelvic inflammatory disease)    PTSD (post-traumatic stress disorder)    Sciatica    Scoliosis     Patient Active Problem List   Diagnosis Date Noted   CAP (community acquired pneumonia) 07/19/2021   Vaginal delivery  11/16/2017   Anemia in pregnancy 08/30/2017   Unwanted fertility 08/23/2017   Obesity affecting pregnancy, antepartum 06/19/2017   Supervision of high risk pregnancy, antepartum, second trimester 05/29/2017   History of preterm delivery, currently pregnant in second trimester 05/29/2017   Tobacco use in pregnancy, antepartum, second trimester 05/29/2017   Drug use affecting pregnancy in second trimester 05/29/2017   History of premature delivery 08/10/2016   Bipolar disorder (HCC) 08/07/2016   Herniated nucleus pulposus, L4-5 left 11/30/2014   Chronic pain syndrome 10/28/2012   Neck pain 10/28/2012   Back pain 10/28/2012   Osteoarthritis 10/28/2012   Migraine    Asthma     Past Surgical History:  Procedure Laterality Date   ABDOMINAL EXPLORATION SURGERY  approx 37 years old   rlq(due to adhesions around ovaries)-appendix and ovaries remain   ANTERIOR CERVICAL DECOMP/DISCECTOMY FUSION N/A 12/09/2012   Procedure: ANTERIOR CERVICAL DECOMPRESSION/DISCECTOMY FUSION STRUCTURAL ALLOGRAFT TRESTLE PLATE CERVICAL FIVE-SIX,SIX-SEVEN;  Surgeon: Clydene Fake, MD;  Location: [facility];  Service: Neurosurgery;  Laterality: N/A;   BACK SURGERY     CHOLECYSTECTOMY  05/10/2011   Procedure: LAPAROSCOPIC CHOLECYSTECTOMY WITH INTRAOPERATIVE CHOLANGIOGRAM;  Surgeon: Atilano Ina, MD,FACS;  Location: [facility];  Service: General;  Laterality: N/A;   LUMBAR LAMINECTOMY/DECOMPRESSION MICRODISCECTOMY Left 11/30/2014   Procedure: Left lumbar four-five Microdiskectomy;  Surgeon: Barnett Abu, MD;  Location: [facility];  Service: Neurosurgery;  Laterality: Left;   LUMBAR LAMINECTOMY/DECOMPRESSION MICRODISCECTOMY Left 08/15/2015   Procedure: Left Lumbar four-five Microdiskectomy;  Surgeon: Barnett Abu, MD;  Location: [facility];  Service: Neurosurgery;  Laterality: Left;   LUMBAR LAMINECTOMY/DECOMPRESSION MICRODISCECTOMY Left 09/28/2016   Procedure: LEFT LUMBAR FIVE -SACRAL ONE Microdiscectomy;  Surgeon: Barnett Abu, MD;  Location: [facility];  Service: Neurosurgery;  Laterality: Left;   TUBAL LIGATION Bilateral 11/16/2017   Procedure: POST PARTUM TUBAL LIGATION;  Surgeon: Blue Bing, MD;  Location: [facility];  Service: Gynecology;  Laterality: Bilateral;    OB History     Gravida  6   Para  4   Term  3   Preterm  1   AB  2   Living  4      SAB  2   IAB      Ectopic      Multiple  0   Live Births  4            Home Medications    Prior to Admission medications   Medication Sig Start Date End Date Taking? Authorizing Provider  cetirizine (ZYRTEC) 10 MG tablet Take 10 mg by mouth daily. 02/22/21  Yes [provider]  rosuvastatin (CRESTOR) 5 MG tablet Take 5 mg by mouth daily.   Yes [provider]  sertraline (ZOLOFT) 100 MG tablet Take 1 tablet by mouth daily.   Yes [provider]  tiZANidine (ZANAFLEX) 4 MG tablet Take 1 tablet (4 mg total) by mouth every 8 (eight) hours as needed for muscle spasms. Do not take with alcohol or while driving or operating heavy machinery.  May cause drowsiness.  Do not take with other muscle relaxants or sedating medications. 02/19/23  Yes Valentino Nose, NP  albuterol (VENTOLIN HFA) 108 (90 Base) MCG/ACT inhaler Inhale into the lungs every 6 (six) hours as needed for wheezing or shortness of breath.    [provider]  cefdinir (OMNICEF) 300 MG capsule Take 1 capsule (300 mg total) by mouth 2 (two) times daily. 12/29/22   Mardella Layman, MD  clonazePAM (KLONOPIN) 0.5 MG tablet Take 1 tablet by mouth 3 (three) times daily as needed.    [provider]  cyclobenzaprine (FLEXERIL) 10 MG tablet Take 10 mg by mouth 3 (three) times daily as needed.    [provider]  ondansetron (ZOFRAN-ODT) 4 MG disintegrating tablet Take 1 tablet (4 mg total) by mouth every 8 (eight) hours as needed for nausea or vomiting. 12/29/22   Mardella Layman, MD    Family History Family History   Problem Relation Age of Onset   Diabetes Mother    Endometriosis Mother    Cancer Mother        lung   Diabetes Father    Heart disease Father    Prostate cancer Maternal Grandfather    Cancer Maternal Grandfather        prostate   Colon polyps Maternal Grandmother    Cirrhosis Paternal Grandfather     Social History Social History   Tobacco Use   Smoking status: Every Day    Current packs/day: 1.00    Average packs/day: 1 pack/day for 12.0 years (12.0 ttl pk-yrs)    Types: Cigarettes   Smokeless tobacco: Never   Tobacco comments:  trying to quit  Vaping Use   Vaping status: Never Used  Substance Use Topics   Alcohol use: Not Currently    Comment: occasionally   Drug use: Yes    Types: Marijuana, Cocaine    Comment: cocaine she states is rec, she states she stopped marijuana 4 months ago     Allergies   Banana, Latex, Bioflavonoids, Hydrocodone, Orange fruit [citrus], and Miconazole   Review of Systems Review of Systems Per HPI  Physical Exam Triage Vital Signs ED Triage Vitals [02/19/23 1958]  Encounter Vitals Group     BP      Systolic BP Percentile      Diastolic BP Percentile      Pulse      Resp      Temp      Temp src      SpO2      Weight      Height      Head Circumference      Peak Flow      Pain Score 7     Pain Loc      Pain Education      Exclude from Growth Chart    No data found.  Updated Vital Signs BP 124/85   Pulse 91   Temp 99 F (37.2 C)   Resp (!) 26   SpO2 98%   Visual Acuity Right Eye Distance:   Left Eye Distance:   Bilateral Distance:    Right Eye Near:   Left Eye Near:    Bilateral Near:     Physical Exam Vitals and nursing note reviewed.  Constitutional:      General: She is not in acute distress.    Appearance: Normal appearance. She is not toxic-appearing.  HENT:     Mouth/Throat:     Mouth: Mucous membranes are moist.     Pharynx: Oropharynx is clear.  Pulmonary:     Effort: Pulmonary effort  is normal. No respiratory distress.     Breath sounds: No decreased breath sounds, wheezing, rhonchi or rales.  Musculoskeletal:       Arms:     Comments: Exquisite tenderness with palpation of posterior chest wall in approximately area as marked.  No swelling, bruising, obvious deformity noted.  Patient moving all 4 extremities equally and without difficulty.  She is neurovascularly intact in all 4 distal extremities.  Skin:    General: Skin is warm and dry.     Capillary Refill: Capillary refill takes less than 2 seconds.     Coloration: Skin is not jaundiced or pale.     Findings: No erythema.  Neurological:     Mental Status: She is alert and oriented to person, place, and time.  Psychiatric:        Behavior: Behavior is cooperative.      UC Treatments / Results  Labs (all labs ordered are listed, but only abnormal results are displayed) Labs Reviewed - No data to display  EKG   Radiology No results found.  Procedures Procedures (including critical care time)  Medications Ordered in UC Medications  dexamethasone (DECADRON) injection 10 mg (10 mg Intramuscular Given 02/19/23 2016)    Initial Impression / Assessment and Plan / UC Course  I have reviewed the triage vital signs and the nursing notes.  Pertinent labs & imaging results that were available during my care of the patient were reviewed by me and considered in my medical decision making (see chart for details).  Patient is well-appearing, normotensive, afebrile, not tachycardic, oxygenating well on room air.  Patient is mildly tachypneic in triage.  1. Acute bilateral thoracic back pain Suspect musculoskeletal cause, likely muscle strain Low suspicion for pneumonia-lungs are clear to auscultation and she is oxygenating well on room air Treat with Decadron 10 mg IM, muscle relaxant medication Recommended light range of motion/stretching exercises Strict ER and return precautions discussed with  patient  The patient was given the opportunity to ask questions.  All questions answered to their satisfaction.  The patient is in agreement to this plan.    Final Clinical Impressions(s) / UC Diagnoses   Final diagnoses:  Acute bilateral thoracic back pain     Discharge Instructions      We gave you an injection of steroid medicine today to help with the pain in your back.  In addition, you can take Tylenol 500 to 1000 mg every 6 hours for pain.  Also recommend using the muscle relaxant every 8 hours as needed for muscle spasms.  Do not take this with other sedating medication or prior to operating or driving heavy machinery as it may cause drowsiness.  Seek care in emergency room if pain does not improve with treatment.     ED Prescriptions     Medication Sig Dispense Auth. Provider   tiZANidine (ZANAFLEX) 4 MG tablet Take 1 tablet (4 mg total) by mouth every 8 (eight) hours as needed for muscle spasms. Do not take with alcohol or while driving or operating heavy machinery.  May cause drowsiness.  Do not take with other muscle relaxants or sedating medications. 30 tablet Valentino Nose, NP      I have reviewed the PDMP during this encounter.   Valentino Nose, NP 02/20/23 [phone]

## 2023-03-12 ENCOUNTER — Encounter (HOSPITAL_COMMUNITY): Payer: Self-pay | Admitting: *Deleted

## 2023-03-12 ENCOUNTER — Ambulatory Visit (HOSPITAL_COMMUNITY)
Admission: EM | Admit: 2023-03-12 | Discharge: 2023-03-12 | Disposition: A | Discharge status: Home / Self Care | Payer: Medicare HMO | Attending: Emergency Medicine | Admitting: Emergency Medicine

## 2023-03-12 DIAGNOSIS — R051 Acute cough: Secondary | ICD-10-CM | POA: Diagnosis not present

## 2023-03-12 DIAGNOSIS — H9202 Otalgia, left ear: Secondary | ICD-10-CM | POA: Diagnosis not present

## 2023-03-12 DIAGNOSIS — J0140 Acute pansinusitis, unspecified: Secondary | ICD-10-CM

## 2023-03-12 DIAGNOSIS — L2989 Other pruritus: Secondary | ICD-10-CM

## 2023-03-12 DIAGNOSIS — H6992 Unspecified Eustachian tube disorder, left ear: Secondary | ICD-10-CM

## 2023-03-12 MED ORDER — TRIAMCINOLONE ACETONIDE 0.1 % EX CREA: 1.0000 | TOPICAL_CREAM | Freq: Two times a day (BID) | CUTANEOUS | 0 refills | Status: AC

## 2023-03-12 MED ORDER — FLUTICASONE PROPIONATE 50 MCG/ACT NA SUSP: 2.0000 | Freq: Every day | NASAL | 0 refills | Status: AC

## 2023-03-12 MED ORDER — AEROCHAMBER MV MISC: 1 refills | Status: AC

## 2023-03-12 MED ORDER — PREDNISONE 20 MG PO TABS: 40.0000 mg | ORAL_TABLET | Freq: Every day | ORAL | 0 refills | Status: AC

## 2023-03-12 MED ORDER — AMOXICILLIN-POT CLAVULANATE 875-125 MG PO TABS: 1.0000 | ORAL_TABLET | Freq: Two times a day (BID) | ORAL | 0 refills | Status: DC

## 2023-03-12 MED ORDER — ALBUTEROL SULFATE HFA 108 (90 BASE) MCG/ACT IN AERS: 1.0000 | INHALATION_SPRAY | RESPIRATORY_TRACT | 0 refills | Status: AC | PRN

## 2023-03-12 NOTE — ED Provider Notes (Signed)
HPI  SUBJECTIVE:  [name] is a 37 y.o. female who presents with 1 week of left-sided headaches located behind her eye, nasal congestion, sinus pain and pressure, green rhinorrhea, upper dental pain, left pulsing ear pain, tinnitus, intermittent dizziness/vertigo.  She reports postnasal drip,burning sore throat and a cough that is occasionally productive of greenish mucus.  She reports chest soreness primarily along her bilateral lower ribs from the coughing.  No shortness of breath, wheezing.  No facial swelling, double sickening.  She was on Omnicef in early October for maxillary sinusitis and bilateral suppurative otitis media.  No antipyretic in the past 6 hours.  She has been taking Alka-Seltzer day and night with improvement in her symptoms.  She has also tried Flonase, Goody's, Hall's.  Symptoms are worse with having the heat on in the department.  She does not have any albuterol at home.  She also reports a rash on the left forearm has been present for 2 years.  States that it becomes pruritic and raised only if she takes Diflucan.  States that she took some Diflucan recently and it is now bothering her.  It has otherwise not changed or spread.  She has not tried anything for this.  Patient has a past medical history of smoking, COPD, chronic back pain, cervical cancer, GERD on Prilosec, asthma, PID, otitis media, acute kidney failure, but kidneys work normally now.  She is status post bilateral tubal ligation PCP: [facility].  Past Medical History:  Diagnosis Date   Abortion in first trimester 08/2016   Anemia    Anxiety    Panic attack   Arthritis    Asthma    Bipolar 1 disorder (HCC)    Cancer (HCC)    Cervical cancer (HCC)    Chronic kidney disease    history of kidney shut down  no current problems   Constipation    DDD (degenerative disc disease), cervical    Edema    Gallstones    GERD (gastroesophageal reflux disease)    High cholesterol    History of  anemia    History of renal failure    HPV (human papilloma virus) anogenital infection    Leukocytosis    going to hematologist   Migraine    Obesity    Obesity    Pelvic inflammatory disease (PID) 05-08-11   previous hx. .Hx. childbirth x3-NVD   PID (pelvic inflammatory disease)    PTSD (post-traumatic stress disorder)    Sciatica    Scoliosis     Past Surgical History:  Procedure Laterality Date   ABDOMINAL EXPLORATION SURGERY  approx 37 years old   rlq(due to adhesions around ovaries)-appendix and ovaries remain   ANTERIOR CERVICAL DECOMP/DISCECTOMY FUSION N/A 12/09/2012   Procedure: ANTERIOR CERVICAL DECOMPRESSION/DISCECTOMY FUSION STRUCTURAL ALLOGRAFT TRESTLE PLATE CERVICAL FIVE-SIX,SIX-SEVEN;  Surgeon: Clydene Fake, MD;  Location: [facility];  Service: Neurosurgery;  Laterality: N/A;   BACK SURGERY     CHOLECYSTECTOMY  05/10/2011   Procedure: LAPAROSCOPIC CHOLECYSTECTOMY WITH INTRAOPERATIVE CHOLANGIOGRAM;  Surgeon: Atilano Ina, MD,FACS;  Location: [facility];  Service: General;  Laterality: N/A;   LUMBAR LAMINECTOMY/DECOMPRESSION MICRODISCECTOMY Left 11/30/2014   Procedure: Left lumbar four-five Microdiskectomy;  Surgeon: Barnett Abu, MD;  Location: [facility];  Service: Neurosurgery;  Laterality: Left;   LUMBAR LAMINECTOMY/DECOMPRESSION MICRODISCECTOMY Left 08/15/2015   Procedure: Left Lumbar four-five Microdiskectomy;  Surgeon: Barnett Abu, MD;  Location: [facility];  Service: Neurosurgery;  Laterality: Left;   LUMBAR LAMINECTOMY/DECOMPRESSION  MICRODISCECTOMY Left 09/28/2016   Procedure: LEFT LUMBAR FIVE -SACRAL ONE Microdiscectomy;  Surgeon: Barnett Abu, MD;  Location: [facility];  Service: Neurosurgery;  Laterality: Left;   TUBAL LIGATION Bilateral 11/16/2017   Procedure: POST PARTUM TUBAL LIGATION;  Surgeon: [place] Bing, MD;  Location: [facility];  Service: Gynecology;  Laterality: Bilateral;    Family History  Problem Relation Age of Onset   Diabetes Mother     Endometriosis Mother    Cancer Mother        lung   Diabetes Father    Heart disease Father    Prostate cancer Maternal Grandfather    Cancer Maternal Grandfather        prostate   Colon polyps Maternal Grandmother    Cirrhosis Paternal Grandfather     Social History   Tobacco Use   Smoking status: Every Day    Current packs/day: 1.00    Average packs/day: 1 pack/day for 12.0 years (12.0 ttl pk-yrs)    Types: Cigarettes   Smokeless tobacco: Never   Tobacco comments:    trying to quit  Vaping Use   Vaping status: Never Used  Substance Use Topics   Alcohol use: Not Currently    Comment: occasionally   Drug use: Yes    Types: Marijuana, Cocaine    Comment: cocaine she states is rec, she states she stopped marijuana 4 months ago    No current facility-administered medications for this encounter.  Current Outpatient Medications:    albuterol (VENTOLIN HFA) 108 (90 Base) MCG/ACT inhaler, Inhale 1-2 puffs into the lungs every 4 (four) hours as needed for wheezing or shortness of breath., Disp: 1 each, Rfl: 0   amoxicillin-clavulanate (AUGMENTIN) 875-125 MG tablet, Take 1 tablet by mouth every 12 (twelve) hours., Disp: 14 tablet, Rfl: 0   clonazePAM (KLONOPIN) 0.5 MG tablet, Take 1 tablet by mouth 3 (three) times daily as needed., Disp: , Rfl:    fluconazole (DIFLUCAN) 150 MG tablet, TAKE 1 TABLET TODAY REPEAT IN 3 DAYS, Disp: , Rfl:    fluticasone (FLONASE) 50 MCG/ACT nasal spray, Place 2 sprays into both nostrils daily., Disp: 16 g, Rfl: 0   predniSONE (DELTASONE) 20 MG tablet, Take 2 tablets (40 mg total) by mouth daily with breakfast for 5 days., Disp: 10 tablet, Rfl: 0   rosuvastatin (CRESTOR) 5 MG tablet, Take 5 mg by mouth daily., Disp: , Rfl:    sertraline (ZOLOFT) 100 MG tablet, Take 1 tablet by mouth daily., Disp: , Rfl:    Spacer/Aero-Holding Chambers (AEROCHAMBER MV) inhaler, Use as instructed, Disp: 1 each, Rfl: 1   triamcinolone cream (KENALOG) 0.1 %, Apply 1  Application topically 2 (two) times daily. Apply for 2 weeks. May use on face, Disp: 30 g, Rfl: 0   ondansetron (ZOFRAN-ODT) 4 MG disintegrating tablet, Take 1 tablet (4 mg total) by mouth every 8 (eight) hours as needed for nausea or vomiting., Disp: 15 tablet, Rfl: 0   tiZANidine (ZANAFLEX) 4 MG tablet, Take 1 tablet (4 mg total) by mouth every 8 (eight) hours as needed for muscle spasms. Do not take with alcohol or while driving or operating heavy machinery.  May cause drowsiness.  Do not take with other muscle relaxants or sedating medications., Disp: 30 tablet, Rfl: 0  Allergies  Allergen Reactions   Banana Itching      Itchy throat   Latex Itching and Rash    Red spots   Bioflavonoids Other (See Comments)   Hydrocodone Hives  Orange Fruit [Citrus] Other (See Comments)    Mouth pain, acid reflux    Miconazole Other (See Comments) and Rash    Paradoxical effect: increases yeast (also)     ROS  As noted in HPI.   Physical Exam  BP 128/80 (BP Location: Left Arm)   Pulse 86   Temp 98.5 F (36.9 C) (Oral)   Resp 20   LMP 03/05/2023 (Approximate)   SpO2 95%   Constitutional: Well developed, well nourished, no acute distress Eyes:  EOMI, conjunctiva normal bilaterally HENT: Normocephalic, atraumatic,mucus membranes moist.  Left external ear, EAC, TM normal.  No pain with traction on pinna, palpation tragus or mastoid.  Positive purulent nasal congestion bilaterally.  Normal turbinates.  Positive frontal, maxillary sinus tenderness left side.  Normal tonsils without exudates.  Positive postnasal drip. Neck: Positive left-sided cervical lymphadenopathy Respiratory: Normal inspiratory effort, lungs clear bilaterally, good air movement.  Positive bilateral lateral chest wall tenderness Cardiovascular: Normal rate, regular rhythm, no murmurs rubs or gallops. GI: nondistended skin: Circular erythematous area on left forearm Musculoskeletal: no deformities Neurologic: Alert &  oriented x 3, no focal neuro deficits Psychiatric: Speech and behavior appropriate   ED Course   Medications - No data to display  No orders of the defined types were placed in this encounter.   No results found for this or any previous visit (from the past 24 hours). No results found.  ED Clinical Impression  1. Acute non-recurrent pansinusitis   2. Acute otalgia, left   3. Eustachian tube dysfunction, left   4. Acute cough   5. Chronic pruritic rash in adult      ED Assessment/Plan     Previous records reviewed.  As noted in HPI.  She presents with multiple issues:   1.  Acute left-sided maxillary and frontal sinusitis.  She is reporting upper dental pain.  Given severity of symptoms, will send home with Augmentin for 7 days, Mucinex D, saline nasal irrigation, continue Flonase.  2.  Left otalgia.  No evidence of otitis externa, media.  Suspect eustachian tube dysfunction.  Flonase, prednisone.  2.  Cough.  Suspect combination postnasal drip and GERD, although she could be having a COPD exacerbation.  Regular scheduled albuterol inhaler with a spacer for 4 days, then as needed.  We can try some prednisone 40 mg for 5 days as well.  No focal lung findings, fevers, hypoxia, deferring chest x-ray today.  3.  Chronic rash.  Triamcinolone cream.  Follow-up with dermatology.  Will give her dermatology's contact information.  Discussed MDM, treatment plan, and plan for follow-up with patient. Discussed sn/sx that should prompt return to the ED. patient agrees with plan.   Meds ordered this encounter  Medications   fluticasone (FLONASE) 50 MCG/ACT nasal spray    Sig: Place 2 sprays into both nostrils daily.    Dispense:  16 g    Refill:  0   amoxicillin-clavulanate (AUGMENTIN) 875-125 MG tablet    Sig: Take 1 tablet by mouth every 12 (twelve) hours.    Dispense:  14 tablet    Refill:  0   predniSONE (DELTASONE) 20 MG tablet    Sig: Take 2 tablets (40 mg total) by mouth  daily with breakfast for 5 days.    Dispense:  10 tablet    Refill:  0   albuterol (VENTOLIN HFA) 108 (90 Base) MCG/ACT inhaler    Sig: Inhale 1-2 puffs into the lungs every 4 (four) hours as needed for  wheezing or shortness of breath.    Dispense:  1 each    Refill:  0   Spacer/Aero-Holding Chambers (AEROCHAMBER MV) inhaler    Sig: Use as instructed    Dispense:  1 each    Refill:  1   triamcinolone cream (KENALOG) 0.1 %    Sig: Apply 1 Application topically 2 (two) times daily. Apply for 2 weeks. May use on face    Dispense:  30 g    Refill:  0      *This clinic note was created using Scientist, clinical (histocompatibility and immunogenetics). Therefore, there may be occasional mistakes despite careful proofreading.  ?    Domenick Gong, MD 03/14/23 1302

## 2023-03-12 NOTE — ED Triage Notes (Signed)
Pt states she has left ear pain, chest congestion, sore throat, cough X 1 week. She states she is taking Catering manager.    She has a spot her left arm she has had X 2 years. She states its itchy and flares up when she takes diflucan.

## 2023-03-12 NOTE — Discharge Instructions (Addendum)
Start Mucinex-D to keep the mucous thin and to decongest you.   You may take 600 mg of motrin with 1000 mg of tylenol up to 3-4 times a day as needed for pain. This is an effective combination for pain.  Finish the Augmentin, even if you feel better. Use a NeilMed sinus rinse with distilled water as often as you want to to reduce nasal congestion. Follow the directions on the box.   For your cough: Take two puffs from your albuterol inhaler every 4 hours for 2 days, then every 6 hours for 2 days, then as needed. You can back off if you start to fimprove  sooner. Finish the steroids unless your doctor tells you to stop. Finish the antibiotics, even if you feel better.Make sure you drink extra fluids. Return if you get worse, have a fever >100.4, or any other concerns.   Your ear pain should clear up with Tylenol/ibuprofen and the prednisone  If the spacer is too expensive at the pharmacy, you can get an AeroChamber Z-Stat off of [employer] for about $10-$15.  Triamcinolone for the rash.  Follow-up with Dr. Earlene Plater as needed  Go to www.goodrx.com  or www.costplusdrugs.com to look up your medications. This will give you a list of where you can find your prescriptions at the most affordable prices. Or ask the pharmacist what the cash price is, or if they have any other discount programs available to help make your medication more affordable. This can be less expensive than what you would pay with insurance.

## 2023-04-02 ENCOUNTER — Ambulatory Visit (HOSPITAL_COMMUNITY)
Admission: EM | Admit: 2023-04-02 | Discharge: 2023-04-02 | Disposition: A | Discharge status: Home / Self Care | Payer: Medicare HMO | Attending: Emergency Medicine | Admitting: Emergency Medicine

## 2023-04-02 ENCOUNTER — Encounter (HOSPITAL_COMMUNITY): Payer: Self-pay

## 2023-04-02 DIAGNOSIS — J0191 Acute recurrent sinusitis, unspecified: Secondary | ICD-10-CM | POA: Diagnosis not present

## 2023-04-02 DIAGNOSIS — R52 Pain, unspecified: Secondary | ICD-10-CM

## 2023-04-02 MED ORDER — BACLOFEN 5 MG PO TABS: 10.0000 mg | ORAL_TABLET | Freq: Two times a day (BID) | ORAL | 0 refills | Status: DC | PRN

## 2023-04-02 MED ORDER — DOXYCYCLINE HYCLATE 100 MG PO CAPS: 100.0000 mg | ORAL_CAPSULE | Freq: Two times a day (BID) | ORAL | 0 refills | Status: AC

## 2023-04-02 NOTE — Discharge Instructions (Addendum)
 Please take medication as prescribed. Take with food to avoid upset stomach. Finish the full course   You can take the muscle relaxer Baclofen  twice daily. If the medication makes you drowsy, take only at bed time.  Call the ENT clinic to make an appointment

## 2023-04-02 NOTE — ED Triage Notes (Signed)
 Patient c/o headache, nausea, nasal congestion, and bilateral ear pain since she was seen on 03/12/23.  Patient states she was not able to get her medications that were prescribed until 2-3 days ago.(Mucinex and sudafed)

## 2023-04-02 NOTE — ED Provider Notes (Signed)
 [facility]    CSN: [id number] Arrival date & time: 04/02/23  1557      History   Chief Complaint Chief Complaint  Patient presents with   Nasal Congestion   Headache   Otalgia    HPI Lynniah Anavictoria Wilk is a 38 y.o. female.  Presents with 3-week history of nasal congestion, sinus pressure, bilateral ear pain She was seen 3 weeks ago on 12/17 for same.  Was given prednisone  and Augmentin .  Reports she took the medicine but sporadically.  Symptoms may have slightly improved but never went away. No fevers No cough  Past Medical History:  Diagnosis Date   Abortion in first trimester 08/2016   Anemia    Anxiety    Panic attack   Arthritis    Asthma    Bipolar 1 disorder (HCC)    Cancer (HCC)    Cervical cancer (HCC)    Chronic kidney disease    history of kidney shut down  no current problems   Constipation    DDD (degenerative disc disease), cervical    Edema    Gallstones    GERD (gastroesophageal reflux disease)    High cholesterol    History of anemia    History of renal failure    HPV (human papilloma virus) anogenital infection    Leukocytosis    going to hematologist   Migraine    Obesity    Obesity    Pelvic inflammatory disease (PID) 05-08-11   previous hx. .Hx. childbirth x3-NVD   PID (pelvic inflammatory disease)    PTSD (post-traumatic stress disorder)    Sciatica    Scoliosis     Patient Active Problem List   Diagnosis Date Noted   CAP (community acquired pneumonia) 07/19/2021   Vaginal delivery 11/16/2017   Anemia in pregnancy 08/30/2017   Unwanted fertility 08/23/2017   Obesity affecting pregnancy, antepartum 06/19/2017   Supervision of high risk pregnancy, antepartum, second trimester 05/29/2017   History of preterm delivery, currently pregnant in second trimester 05/29/2017   Tobacco use in pregnancy, antepartum, second trimester 05/29/2017   Drug use affecting pregnancy in second trimester 05/29/2017   History of  premature delivery 08/10/2016   Bipolar disorder (HCC) 08/07/2016   Herniated nucleus pulposus, L4-5 left 11/30/2014   Chronic pain syndrome 10/28/2012   Neck pain 10/28/2012   Back pain 10/28/2012   Osteoarthritis 10/28/2012   Migraine    Asthma     Past Surgical History:  Procedure Laterality Date   ABDOMINAL EXPLORATION SURGERY  approx 38 years old   rlq(due to adhesions around ovaries)-appendix and ovaries remain   ANTERIOR CERVICAL DECOMP/DISCECTOMY FUSION N/A 12/09/2012   Procedure: ANTERIOR CERVICAL DECOMPRESSION/DISCECTOMY FUSION STRUCTURAL ALLOGRAFT TRESTLE PLATE CERVICAL FIVE-SIX,SIX-SEVEN;  Surgeon: Lynwood JONELLE Mill, MD;  Location: [facility];  Service: Neurosurgery;  Laterality: N/A;   BACK SURGERY     CHOLECYSTECTOMY  05/10/2011   Procedure: LAPAROSCOPIC CHOLECYSTECTOMY WITH INTRAOPERATIVE CHOLANGIOGRAM;  Surgeon: Camellia CHRISTELLA Blush, MD,FACS;  Location: [facility];  Service: General;  Laterality: N/A;   LUMBAR LAMINECTOMY/DECOMPRESSION MICRODISCECTOMY Left 11/30/2014   Procedure: Left lumbar four-five Microdiskectomy;  Surgeon: Victory Gens, MD;  Location: [facility];  Service: Neurosurgery;  Laterality: Left;   LUMBAR LAMINECTOMY/DECOMPRESSION MICRODISCECTOMY Left 08/15/2015   Procedure: Left Lumbar four-five Microdiskectomy;  Surgeon: Victory Gens, MD;  Location: [facility];  Service: Neurosurgery;  Laterality: Left;   LUMBAR LAMINECTOMY/DECOMPRESSION MICRODISCECTOMY Left 09/28/2016   Procedure: LEFT LUMBAR FIVE -SACRAL ONE Microdiscectomy;  Surgeon:  Colon Shove, MD;  Location: [facility] OR;  Service: Neurosurgery;  Laterality: Left;   TUBAL LIGATION Bilateral 11/16/2017   Procedure: POST PARTUM TUBAL LIGATION;  Surgeon: Izell Harari, MD;  Location: [facility];  Service: Gynecology;  Laterality: Bilateral;    OB History     Gravida  6   Para  4   Term  3   Preterm  1   AB  2   Living  4      SAB  2   IAB      Ectopic      Multiple  0   Live Births  4             Home Medications    Prior to Admission medications   Medication Sig Start Date End Date Taking? Authorizing Provider  Baclofen  5 MG TABS Take 2 tablets (10 mg total) by mouth 2 (two) times daily as needed. 04/02/23  Yes Devanshi Califf, Asberry, PA-C  doxycycline  (VIBRAMYCIN ) 100 MG capsule Take 1 capsule (100 mg total) by mouth 2 (two) times daily for 7 days. 04/02/23 04/09/23 Yes Kambryn Dapolito, Asberry, PA-C  albuterol  (VENTOLIN  HFA) 108 (90 Base) MCG/ACT inhaler Inhale 1-2 puffs into the lungs every 4 (four) hours as needed for wheezing or shortness of breath. 03/12/23   Van Knee, MD  fluticasone  (FLONASE ) 50 MCG/ACT nasal spray Place 2 sprays into both nostrils daily. 03/12/23   Van Knee, MD  rosuvastatin (CRESTOR) 5 MG tablet Take 5 mg by mouth daily.    [provider]  sertraline (ZOLOFT) 100 MG tablet Take 1 tablet by mouth daily.    [provider]  Spacer/Aero-Holding Chambers (AEROCHAMBER MV) inhaler Use as instructed 03/12/23   Van Knee, MD  triamcinolone  cream (KENALOG ) 0.1 % Apply 1 Application topically 2 (two) times daily. Apply for 2 weeks. May use on face 03/12/23   Van Knee, MD    Family History Family History  Problem Relation Age of Onset   Diabetes Mother    Endometriosis Mother    Cancer Mother        lung   Diabetes Father    Heart disease Father    Prostate cancer Maternal Grandfather    Cancer Maternal Grandfather        prostate   Colon polyps Maternal Grandmother    Cirrhosis Paternal Grandfather     Social History Social History   Tobacco Use   Smoking status: Every Day    Current packs/day: 1.00    Average packs/day: 1 pack/day for 12.0 years (12.0 ttl pk-yrs)    Types: Cigarettes   Smokeless tobacco: Never   Tobacco comments:    trying to quit  Vaping Use   Vaping status: Never Used  Substance Use Topics   Alcohol use: Not Currently    Comment: occasionally   Drug use: Yes    Types:  Marijuana, Cocaine    Comment: cocaine she states is rec, she states she stopped marijuana 4 months ago     Allergies   Banana, Latex, Bioflavonoids, Hydrocodone , Orange fruit [citrus], and Miconazole   Review of Systems Review of Systems Per HPI  Physical Exam Triage Vital Signs ED Triage Vitals  Encounter Vitals Group     BP 04/02/23 1840 (!) 127/94     Systolic BP Percentile --      Diastolic BP Percentile --      Pulse Rate 04/02/23 1840 87     Resp 04/02/23 1840 16  Temp 04/02/23 1840 98.2 F (36.8 C)     Temp Source 04/02/23 1840 Oral     SpO2 04/02/23 1840 94 %     Weight --      Height --      Head Circumference --      Peak Flow --      Pain Score 04/02/23 1843 5     Pain Loc --      Pain Education --      Exclude from Growth Chart --    No data found.  Updated Vital Signs BP (!) 127/94 (BP Location: Right Arm)   Pulse 87   Temp 98.2 F (36.8 C) (Oral)   Resp 16   LMP 03/05/2023 (Approximate)   SpO2 94%    Physical Exam Vitals and nursing note reviewed.  Constitutional:      Appearance: She is not ill-appearing.  HENT:     Right Ear: Tympanic membrane and ear canal normal.     Left Ear: Tympanic membrane and ear canal normal.     Nose: Congestion present. No rhinorrhea.     Mouth/Throat:     Mouth: Mucous membranes are moist.     Pharynx: Oropharynx is clear. No posterior oropharyngeal erythema.  Eyes:     Conjunctiva/sclera: Conjunctivae normal.  Cardiovascular:     Rate and Rhythm: Normal rate and regular rhythm.     Pulses: Normal pulses.     Heart sounds: Normal heart sounds.  Pulmonary:     Effort: Pulmonary effort is normal.     Breath sounds: Normal breath sounds. No wheezing or rales.  Musculoskeletal:     Cervical back: Normal range of motion.  Lymphadenopathy:     Cervical: No cervical adenopathy.  Skin:    General: Skin is warm and dry.  Neurological:     Mental Status: She is alert and oriented to person, place, and  time.     UC Treatments / Results  Labs (all labs ordered are listed, but only abnormal results are displayed) Labs Reviewed - No data to display  EKG   Radiology No results found.  Procedures Procedures (including critical care time)  Medications Ordered in UC Medications - No data to display  Initial Impression / Assessment and Plan / UC Course  I have reviewed the triage vital signs and the nursing notes.  Pertinent labs & imaging results that were available during my care of the patient were reviewed by me and considered in my medical decision making (see chart for details).  3 weeks of nasal congestion.  She had an Augmentin  prescription however does not seem she took it for the full course.  Will send doxycycline .  Advised taking twice a day for 7 days in a row.  Patient reports she will be compliant with the medicine this time.  Also provided with ENT information.  She has had several sinus infections over the last few months.  Recommend follow-up.  Patient is agreeable to plan, all questions answered   Also sent baclofen  for back pain which she has chronic history of  Final Clinical Impressions(s) / UC Diagnoses   Final diagnoses:  Acute recurrent sinusitis, unspecified location  Body aches     Discharge Instructions      Please take medication as prescribed. Take with food to avoid upset stomach. Finish the full course   You can take the muscle relaxer Baclofen  twice daily. If the medication makes you drowsy, take only at bed time.  Call the ENT clinic to make an appointment      ED Prescriptions     Medication Sig Dispense Auth. Provider   doxycycline  (VIBRAMYCIN ) 100 MG capsule Take 1 capsule (100 mg total) by mouth 2 (two) times daily for 7 days. 14 capsule Asbury Hair, PA-C   Baclofen  5 MG TABS Take 2 tablets (10 mg total) by mouth 2 (two) times daily as needed. 40 tablet Else Habermann, Asberry, PA-C      PDMP not reviewed this encounter.    Neda Willenbring, PA-C 04/02/23 2103

## 2023-05-03 ENCOUNTER — Ambulatory Visit
Admission: EM | Admit: 2023-05-03 | Discharge: 2023-05-03 | Disposition: A | Discharge status: Home / Self Care | Payer: Medicare HMO

## 2023-05-03 ENCOUNTER — Encounter: Payer: Self-pay | Admitting: *Deleted

## 2023-05-03 ENCOUNTER — Other Ambulatory Visit: Payer: Self-pay

## 2023-05-03 DIAGNOSIS — J069 Acute upper respiratory infection, unspecified: Secondary | ICD-10-CM

## 2023-05-03 DIAGNOSIS — A084 Viral intestinal infection, unspecified: Secondary | ICD-10-CM | POA: Diagnosis not present

## 2023-05-03 LAB — POCT INFLUENZA A/B
Influenza A, POC: NEGATIVE
Influenza B, POC: NEGATIVE

## 2023-05-03 MED ORDER — ONDANSETRON 4 MG PO TBDP: 4.0000 mg | ORAL_TABLET | Freq: Three times a day (TID) | ORAL | 0 refills | Status: DC | PRN

## 2023-05-03 MED ORDER — OSELTAMIVIR PHOSPHATE 75 MG PO CAPS: 75.0000 mg | ORAL_CAPSULE | Freq: Every day | ORAL | 0 refills | Status: DC

## 2023-05-03 MED ORDER — BENZONATATE 200 MG PO CAPS: 200.0000 mg | ORAL_CAPSULE | Freq: Three times a day (TID) | ORAL | 0 refills | Status: DC | PRN

## 2023-05-03 NOTE — Discharge Instructions (Signed)
 You may take Pepto for the diarrhea as directed per the box if you needed

## 2023-05-03 NOTE — ED Triage Notes (Signed)
 Cough x 3 days,Sob after coughing, vomited phlegm yesterday. States family members have had the flu

## 2023-05-03 NOTE — ED Provider Notes (Signed)
 [facility]    CSN: [id number] Arrival date & time: 05/03/23  1539      History   Chief Complaint Chief Complaint  Patient presents with   Cough    HPI [name] is a 38 y.o. female who presents due to having rhinitis, cough, x 3 days. Cough attacks makes her feels SOB. Has not had a fever. Her husband  and brother in law have been diagnosed with influenza.  Vomited x 5 yesterday, diarrhea onset x 2 days on average 4 per day.  Wants a flu test.   Past Medical History:  Diagnosis Date   Abortion in first trimester 08/2016   Anemia    Anxiety    Panic attack   Arthritis    Asthma    Bipolar 1 disorder (HCC)    Cancer (HCC)    Cervical cancer (HCC)    Chronic kidney disease    history of kidney shut down  no current problems   Constipation    DDD (degenerative disc disease), cervical    Edema    Gallstones    GERD (gastroesophageal reflux disease)    High cholesterol    History of anemia    History of renal failure    HPV (human papilloma virus) anogenital infection    Leukocytosis    going to hematologist   Migraine    Obesity    Obesity    Pelvic inflammatory disease (PID) 05-08-11   previous hx. .Hx. childbirth x3-NVD   PID (pelvic inflammatory disease)    PTSD (post-traumatic stress disorder)    Sciatica    Scoliosis     Patient Active Problem List   Diagnosis Date Noted   CAP (community acquired pneumonia) 07/19/2021   Vaginal delivery 11/16/2017   Anemia in pregnancy 08/30/2017   Unwanted fertility 08/23/2017   Obesity affecting pregnancy, antepartum 06/19/2017   Supervision of high risk pregnancy, antepartum, second trimester 05/29/2017   History of preterm delivery, currently pregnant in second trimester 05/29/2017   Tobacco use in pregnancy, antepartum, second trimester 05/29/2017   Drug use affecting pregnancy in second trimester 05/29/2017   History of premature delivery 08/10/2016   Bipolar disorder (HCC) 08/07/2016    Herniated nucleus pulposus, L4-5 left 11/30/2014   Chronic pain syndrome 10/28/2012   Neck pain 10/28/2012   Back pain 10/28/2012   Osteoarthritis 10/28/2012   Migraine    Asthma     Past Surgical History:  Procedure Laterality Date   ABDOMINAL EXPLORATION SURGERY  approx 38 years old   rlq(due to adhesions around ovaries)-appendix and ovaries remain   ANTERIOR CERVICAL DECOMP/DISCECTOMY FUSION N/A 12/09/2012   Procedure: ANTERIOR CERVICAL DECOMPRESSION/DISCECTOMY FUSION STRUCTURAL ALLOGRAFT TRESTLE PLATE CERVICAL FIVE-SIX,SIX-SEVEN;  Surgeon: Lynwood JONELLE Mill, MD;  Location: [facility];  Service: Neurosurgery;  Laterality: N/A;   BACK SURGERY     CHOLECYSTECTOMY  05/10/2011   Procedure: LAPAROSCOPIC CHOLECYSTECTOMY WITH INTRAOPERATIVE CHOLANGIOGRAM;  Surgeon: Camellia CHRISTELLA Blush, MD,FACS;  Location: [facility];  Service: General;  Laterality: N/A;   LUMBAR LAMINECTOMY/DECOMPRESSION MICRODISCECTOMY Left 11/30/2014   Procedure: Left lumbar four-five Microdiskectomy;  Surgeon: Victory Gens, MD;  Location: [facility];  Service: Neurosurgery;  Laterality: Left;   LUMBAR LAMINECTOMY/DECOMPRESSION MICRODISCECTOMY Left 08/15/2015   Procedure: Left Lumbar four-five Microdiskectomy;  Surgeon: Victory Gens, MD;  Location: [facility];  Service: Neurosurgery;  Laterality: Left;   LUMBAR LAMINECTOMY/DECOMPRESSION MICRODISCECTOMY Left 09/28/2016   Procedure: LEFT LUMBAR FIVE -SACRAL ONE Microdiscectomy;  Surgeon: Gens Victory, MD;  Location: [facility];  Service: Neurosurgery;  Laterality: Left;   TUBAL LIGATION Bilateral 11/16/2017   Procedure: POST PARTUM TUBAL LIGATION;  Surgeon: Izell Harari, MD;  Location: [facility];  Service: Gynecology;  Laterality: Bilateral;    OB History     Gravida  6   Para  4   Term  3   Preterm  1   AB  2   Living  4      SAB  2   IAB      Ectopic      Multiple  0   Live Births  4            Home Medications    Prior to Admission  medications   Medication Sig Start Date End Date Taking? Authorizing Provider  albuterol  (VENTOLIN  HFA) 108 (90 Base) MCG/ACT inhaler Inhale 1-2 puffs into the lungs every 4 (four) hours as needed for wheezing or shortness of breath. 03/12/23  Yes Van Knee, MD  Baclofen  5 MG TABS Take 2 tablets (10 mg total) by mouth 2 (two) times daily as needed. 04/02/23  Yes Rising, Asberry, PA-C  benzonatate  (TESSALON ) 200 MG capsule Take 1 capsule (200 mg total) by mouth 3 (three) times daily as needed for cough. 05/03/23  Yes Rodriguez-Southworth, Drago Hammonds, PA-C  fluticasone  (FLONASE ) 50 MCG/ACT nasal spray Place 2 sprays into both nostrils daily. 03/12/23  Yes Van Knee, MD  ondansetron  (ZOFRAN -ODT) 4 MG disintegrating tablet Take 1 tablet (4 mg total) by mouth every 8 (eight) hours as needed for nausea or vomiting. 05/03/23  Yes Rodriguez-Southworth, Majestic Brister, PA-C  oseltamivir  (TAMIFLU ) 75 MG capsule Take 1 capsule (75 mg total) by mouth daily. 05/03/23  Yes Rodriguez-Southworth, Sirron Francesconi, PA-C  rosuvastatin (CRESTOR) 5 MG tablet Take 5 mg by mouth daily.   Yes [provider]  sertraline (ZOLOFT) 100 MG tablet Take 1 tablet by mouth daily.   Yes [provider]  Spacer/Aero-Holding Chambers (AEROCHAMBER MV) inhaler Use as instructed 03/12/23  Yes Van Knee, MD  triamcinolone  cream (KENALOG ) 0.1 % Apply 1 Application topically 2 (two) times daily. Apply for 2 weeks. May use on face 03/12/23  Yes Mortenson, Ashley, MD  clonazePAM (KLONOPIN) 1 MG tablet Take 1 mg by mouth 3 (three) times daily as needed. RAN OUT    [provider]    Family History Family History  Problem Relation Age of Onset   Diabetes Mother    Endometriosis Mother    Cancer Mother        lung   Diabetes Father    Heart disease Father    Prostate cancer Maternal Grandfather    Cancer Maternal Grandfather        prostate   Colon polyps Maternal Grandmother    Cirrhosis Paternal Grandfather      Social History Social History   Tobacco Use   Smoking status: Every Day    Current packs/day: 1.00    Average packs/day: 1 pack/day for 12.0 years (12.0 ttl pk-yrs)    Types: Cigarettes   Smokeless tobacco: Never   Tobacco comments:    trying to quit  Vaping Use   Vaping status: Never Used  Substance Use Topics   Alcohol use: Not Currently    Comment: occasionally   Drug use: Not Currently    Types: Marijuana, Cocaine    Comment: cocaine she states is rec, she states she stopped marijuana 4 months ago     Allergies   Banana, Latex, Bioflavonoids, Hydrocodone ,  Orange fruit [citrus], and Miconazole   Review of Systems Review of Systems  As noted in HPI Physical Exam Triage Vital Signs ED Triage Vitals  Encounter Vitals Group     BP 05/03/23 1645 (!) 149/97     Systolic BP Percentile --      Diastolic BP Percentile --      Pulse Rate 05/03/23 1645 81     Resp 05/03/23 1645 20     Temp 05/03/23 1645 98.8 F (37.1 C)     Temp Source 05/03/23 1645 Oral     SpO2 05/03/23 1645 97 %     Weight --      Height --      Head Circumference --      Peak Flow --      Pain Score 05/03/23 1639 8     Pain Loc --      Pain Education --      Exclude from Growth Chart --    No data found.  Updated Vital Signs BP (!) 149/97 (BP Location: Right Wrist)   Pulse 81   Temp 98.8 F (37.1 C) (Oral)   Resp 20   LMP 04/08/2023 (Approximate)   SpO2 97%   Visual Acuity Right Eye Distance:   Left Eye Distance:   Bilateral Distance:    Right Eye Near:   Left Eye Near:    Bilateral Near:     Physical Exam Physical Exam Vitals signs and nursing note reviewed.  Constitutional:      General: She is not in acute distress.    Appearance: Normal appearance. She is not ill-appearing, toxic-appearing or diaphoretic.  HENT:     Head: Normocephalic.     Right Ear: Tympanic membrane, ear canal and external ear normal.     Left Ear: Tympanic membrane, ear canal and external ear  normal.     Nose: Nose normal.     Mouth/Throat:     Mouth: Mucous membranes are moist.  Eyes:     General: No scleral icterus.       Right eye: No discharge.        Left eye: No discharge.     Conjunctiva/sclera: Conjunctivae normal.  Neck:     Musculoskeletal: Neck supple. No neck rigidity.  Cardiovascular:     Rate and Rhythm: Normal rate and regular rhythm.     Heart sounds: No murmur.  Pulmonary:     Effort: Pulmonary effort is normal.     Breath sounds: Normal breath sounds.  Abdominal:     General: Bowel sounds are normal. There is no distension.     Palpations: Abdomen is soft. There is no mass.     Tenderness: There is generalized abdominal tenderness. There is no guarding or rebound.     Hernia: No hernia is present.  Musculoskeletal: Normal range of motion.  Lymphadenopathy:     Cervical: No cervical adenopathy.  Skin:    General: Skin is warm and dry.     Coloration: Skin is not jaundiced.     Findings: No rash.  Neurological:     Mental Status: She is alert and oriented to person, place, and time.     Gait: Gait normal.  Psychiatric:        Mood and Affect: Mood normal.        Behavior: Behavior normal.        Thought Content: Thought content normal.        Judgment: Judgment normal.  UC Treatments / Results  Labs (all labs ordered are listed, but only abnormal results are displayed) Labs Reviewed  POCT INFLUENZA A/B - Normal  Flu tests negative  EKG   Radiology No results found.  Procedures Procedures (including critical care time)  Medications Ordered in UC Medications - No data to display  Initial Impression / Assessment and Plan / UC Course  I have reviewed the triage vital signs and the nursing notes.  Pertinent labs  results that were available during my care of the patient were reviewed by me and considered in my medical decision making (see chart for details).  URI Gastroenteritis Exposure to influenza  I placed her on  Tessalon , Zofran  and Preventive dose of Tamiflu  as noted.  Advised BRAT diet, no dairy. See instructions   Final Clinical Impressions(s) / UC Diagnoses   Final diagnoses:  Acute upper respiratory infection  Viral gastroenteritis     Discharge Instructions      You may take Pepto for the diarrhea as directed per the box if you needed     ED Prescriptions     Medication Sig Dispense Auth. Provider   ondansetron  (ZOFRAN -ODT) 4 MG disintegrating tablet Take 1 tablet (4 mg total) by mouth every 8 (eight) hours as needed for nausea or vomiting. 20 tablet Rodriguez-Southworth, Symir Mah, PA-C   oseltamivir  (TAMIFLU ) 75 MG capsule Take 1 capsule (75 mg total) by mouth daily. 10 capsule Rodriguez-Southworth, Ailin Rochford, PA-C   benzonatate  (TESSALON ) 200 MG capsule Take 1 capsule (200 mg total) by mouth 3 (three) times daily as needed for cough. 30 capsule Rodriguez-Southworth, Ysabel Stankovich, PA-C      PDMP not reviewed this encounter.   Lindi Carter, PA-C 05/03/23 1846

## 2023-05-15 ENCOUNTER — Encounter (HOSPITAL_COMMUNITY): Payer: Self-pay | Admitting: Emergency Medicine

## 2023-05-15 ENCOUNTER — Ambulatory Visit (HOSPITAL_COMMUNITY)
Admission: EM | Admit: 2023-05-15 | Discharge: 2023-05-15 | Disposition: A | Discharge status: Home / Self Care | Payer: Medicare HMO | Attending: Emergency Medicine | Admitting: Emergency Medicine

## 2023-05-15 DIAGNOSIS — J0100 Acute maxillary sinusitis, unspecified: Secondary | ICD-10-CM | POA: Diagnosis present

## 2023-05-15 DIAGNOSIS — R600 Localized edema: Secondary | ICD-10-CM | POA: Diagnosis present

## 2023-05-15 DIAGNOSIS — M7989 Other specified soft tissue disorders: Secondary | ICD-10-CM

## 2023-05-15 LAB — BASIC METABOLIC PANEL
Anion gap: 13 (ref 5–15)
BUN: 7 mg/dL (ref 6–20)
CO2: 23 mmol/L (ref 22–32)
Calcium: 9.4 mg/dL (ref 8.9–10.3)
Chloride: 103 mmol/L (ref 98–111)
Creatinine, Ser: 0.72 mg/dL (ref 0.44–1.00)
GFR, Estimated: >60 mL/min (ref >60)
Glucose, Bld: 90 mg/dL (ref 70–99)
Potassium: 3.5 mmol/L (ref 3.5–5.1)
Sodium: 139 mmol/L (ref 135–145)

## 2023-05-15 LAB — BRAIN NATRIURETIC PEPTIDE: B Natriuretic Peptide: 90.9 pg/mL (ref 0.0–100.0)

## 2023-05-15 LAB — CBC
HCT: 34.2 % — ABNORMAL LOW (ref 36.0–46.0)
Hemoglobin: 11.1 g/dL — ABNORMAL LOW (ref 12.0–15.0)
MCH: 25.1 pg — ABNORMAL LOW (ref 26.0–34.0)
MCHC: 32.5 g/dL (ref 30.0–36.0)
MCV: 77.4 fL — ABNORMAL LOW (ref 80.0–100.0)
Platelets: 403 10*3/uL — ABNORMAL HIGH (ref 150–400)
RBC: 4.42 MIL/uL (ref 3.87–5.11)
RDW: 17 % — ABNORMAL HIGH (ref 11.5–15.5)
WBC: 10 10*3/uL (ref 4.0–10.5)
nRBC: 0 % (ref 0.0–0.2)

## 2023-05-15 MED ORDER — AMOXICILLIN-POT CLAVULANATE 875-125 MG PO TABS: 1.0000 | ORAL_TABLET | Freq: Two times a day (BID) | ORAL | 0 refills | Status: DC

## 2023-05-15 NOTE — ED Provider Notes (Signed)
[facility]    CSN: [id number] Arrival date & time: 05/15/23  1653      History   Chief Complaint Chief Complaint  Patient presents with   Leg Swelling   Facial Pain   Cough    HPI [name] is a 38 y.o. female.   Patient presents with productive cough with green sputum, congestion, sinus pressure/pain that began 4 days ago.  Denies fever, abdominal pain, nausea, vomiting, and diarrhea.  Patient also endorses bilateral leg swelling x 2 weeks.  Denies chest pain, shortness of breath, and palpitations.  Reports history of chronic kidney disease.    Cough   Past Medical History:  Diagnosis Date   Abortion in first trimester 08/2016   Anemia    Anxiety    Panic attack   Arthritis    Asthma    Bipolar 1 disorder (HCC)    Cancer (HCC)    Cervical cancer (HCC)    Chronic kidney disease    history of kidney shut down  no current problems   Constipation    DDD (degenerative disc disease), cervical    Edema    Gallstones    GERD (gastroesophageal reflux disease)    High cholesterol    History of anemia    History of renal failure    HPV (human papilloma virus) anogenital infection    Leukocytosis    going to hematologist   Migraine    Obesity    Obesity    Pelvic inflammatory disease (PID) 05-08-11   previous hx. .Hx. childbirth x3-NVD   PID (pelvic inflammatory disease)    PTSD (post-traumatic stress disorder)    Sciatica    Scoliosis     Patient Active Problem List   Diagnosis Date Noted   CAP (community acquired pneumonia) 07/19/2021   Vaginal delivery 11/16/2017   Anemia in pregnancy 08/30/2017   Unwanted fertility 08/23/2017   Obesity affecting pregnancy, antepartum 06/19/2017   Supervision of high risk pregnancy, antepartum, second trimester 05/29/2017   History of preterm delivery, currently pregnant in second trimester 05/29/2017   Tobacco use in pregnancy, antepartum, second trimester 05/29/2017   Drug use affecting  pregnancy in second trimester 05/29/2017   History of premature delivery 08/10/2016   Bipolar disorder (HCC) 08/07/2016   Herniated nucleus pulposus, L4-5 left 11/30/2014   Chronic pain syndrome 10/28/2012   Neck pain 10/28/2012   Back pain 10/28/2012   Osteoarthritis 10/28/2012   Migraine    Asthma     Past Surgical History:  Procedure Laterality Date   ABDOMINAL EXPLORATION SURGERY  approx 38 years old   rlq(due to adhesions around ovaries)-appendix and ovaries remain   ANTERIOR CERVICAL DECOMP/DISCECTOMY FUSION N/A 12/09/2012   Procedure: ANTERIOR CERVICAL DECOMPRESSION/DISCECTOMY FUSION STRUCTURAL ALLOGRAFT TRESTLE PLATE CERVICAL FIVE-SIX,SIX-SEVEN;  Surgeon: Clydene Fake, MD;  Location: [facility];  Service: Neurosurgery;  Laterality: N/A;   BACK SURGERY     CHOLECYSTECTOMY  05/10/2011   Procedure: LAPAROSCOPIC CHOLECYSTECTOMY WITH INTRAOPERATIVE CHOLANGIOGRAM;  Surgeon: Atilano Ina, MD,FACS;  Location: [facility];  Service: General;  Laterality: N/A;   LUMBAR LAMINECTOMY/DECOMPRESSION MICRODISCECTOMY Left 11/30/2014   Procedure: Left lumbar four-five Microdiskectomy;  Surgeon: Barnett Abu, MD;  Location: [facility];  Service: Neurosurgery;  Laterality: Left;   LUMBAR LAMINECTOMY/DECOMPRESSION MICRODISCECTOMY Left 08/15/2015   Procedure: Left Lumbar four-five Microdiskectomy;  Surgeon: Barnett Abu, MD;  Location: [facility];  Service: Neurosurgery;  Laterality: Left;   LUMBAR LAMINECTOMY/DECOMPRESSION MICRODISCECTOMY Left 09/28/2016   Procedure:  LEFT LUMBAR FIVE -SACRAL ONE Microdiscectomy;  Surgeon: Barnett Abu, MD;  Location: [facility];  Service: Neurosurgery;  Laterality: Left;   TUBAL LIGATION Bilateral 11/16/2017   Procedure: POST PARTUM TUBAL LIGATION;  Surgeon: Vienna Bing, MD;  Location: [facility];  Service: Gynecology;  Laterality: Bilateral;    OB History     Gravida  6   Para  4   Term  3   Preterm  1   AB  2   Living  4      SAB  2   IAB       Ectopic      Multiple  0   Live Births  4            Home Medications    Prior to Admission medications   Medication Sig Start Date End Date Taking? Authorizing Provider  amoxicillin-clavulanate (AUGMENTIN) 875-125 MG tablet Take 1 tablet by mouth every 12 (twelve) hours. 05/15/23  Yes Susann Givens, Abriel Hattery A, NP  albuterol (VENTOLIN HFA) 108 (90 Base) MCG/ACT inhaler Inhale 1-2 puffs into the lungs every 4 (four) hours as needed for wheezing or shortness of breath. 03/12/23   Domenick Gong, MD  Baclofen 5 MG TABS Take 2 tablets (10 mg total) by mouth 2 (two) times daily as needed. 04/02/23   Rising, Lurena Joiner, PA-C  benzonatate (TESSALON) 200 MG capsule Take 1 capsule (200 mg total) by mouth 3 (three) times daily as needed for cough. 05/03/23   Rodriguez-Southworth, Nettie Elm, PA-C  clonazePAM (KLONOPIN) 1 MG tablet Take 1 mg by mouth 3 (three) times daily as needed. RAN OUT    [provider]  fluticasone (FLONASE) 50 MCG/ACT nasal spray Place 2 sprays into both nostrils daily. 03/12/23   Domenick Gong, MD  ondansetron (ZOFRAN-ODT) 4 MG disintegrating tablet Take 1 tablet (4 mg total) by mouth every 8 (eight) hours as needed for nausea or vomiting. 05/03/23   Rodriguez-Southworth, Nettie Elm, PA-C  oseltamivir (TAMIFLU) 75 MG capsule Take 1 capsule (75 mg total) by mouth daily. 05/03/23   Rodriguez-Southworth, Nettie Elm, PA-C  rosuvastatin (CRESTOR) 5 MG tablet Take 5 mg by mouth daily.    [provider]  sertraline (ZOLOFT) 100 MG tablet Take 1 tablet by mouth daily.    [provider]  Spacer/Aero-Holding Chambers (AEROCHAMBER MV) inhaler Use as instructed 03/12/23   Domenick Gong, MD  triamcinolone cream (KENALOG) 0.1 % Apply 1 Application topically 2 (two) times daily. Apply for 2 weeks. May use on face 03/12/23   Domenick Gong, MD    Family History Family History  Problem Relation Age of Onset   Diabetes Mother    Endometriosis Mother    Cancer  Mother        lung   Diabetes Father    Heart disease Father    Prostate cancer Maternal Grandfather    Cancer Maternal Grandfather        prostate   Colon polyps Maternal Grandmother    Cirrhosis Paternal Grandfather     Social History Social History   Tobacco Use   Smoking status: Every Day    Current packs/day: 1.00    Average packs/day: 1 pack/day for 12.0 years (12.0 ttl pk-yrs)    Types: Cigarettes   Smokeless tobacco: Never   Tobacco comments:    trying to quit  Vaping Use   Vaping status: Never Used  Substance Use Topics   Alcohol use: Not Currently    Comment: occasionally   Drug use: Not Currently  Types: Marijuana, Cocaine    Comment: cocaine she states is rec, she states she stopped marijuana 4 months ago     Allergies   Banana, Latex, Bioflavonoids, Hydrocodone, Orange fruit [citrus], and Miconazole   Review of Systems Review of Systems  Respiratory:  Positive for cough.    Per HPI  Physical Exam Triage Vital Signs ED Triage Vitals  Encounter Vitals Group     BP 05/15/23 1708 (!) 141/77     Systolic BP Percentile --      Diastolic BP Percentile --      Pulse Rate 05/15/23 1708 90     Resp 05/15/23 1708 18     Temp 05/15/23 1708 98.5 F (36.9 C)     Temp Source 05/15/23 1708 Oral     SpO2 05/15/23 1708 97 %     Weight --      Height --      Head Circumference --      Peak Flow --      Pain Score 05/15/23 1711 0     Pain Loc --      Pain Education --      Exclude from Growth Chart --    No data found.  Updated Vital Signs BP (!) 141/77 (BP Location: Left Arm)   Pulse 90   Temp 98.5 F (36.9 C) (Oral)   Resp 18   LMP 04/08/2023 (Approximate)   SpO2 97%   Visual Acuity Right Eye Distance:   Left Eye Distance:   Bilateral Distance:    Right Eye Near:   Left Eye Near:    Bilateral Near:     Physical Exam Vitals and nursing note reviewed.  Constitutional:      General: She is awake. She is not in acute distress.     Appearance: Normal appearance. She is well-developed and well-groomed. She is not ill-appearing.  HENT:     Right Ear: Tympanic membrane, ear canal and external ear normal.     Left Ear: Tympanic membrane, ear canal and external ear normal.     Nose: Congestion and rhinorrhea present.     Mouth/Throat:     Mouth: Mucous membranes are moist.     Pharynx: Posterior oropharyngeal erythema present. No oropharyngeal exudate.  Cardiovascular:     Rate and Rhythm: Normal rate and regular rhythm.  Pulmonary:     Effort: Pulmonary effort is normal.     Breath sounds: Normal breath sounds.  Musculoskeletal:        General: Normal range of motion.     Right lower leg: 1+ Edema present.     Left lower leg: 1+ Edema present.  Skin:    General: Skin is warm and dry.  Neurological:     Mental Status: She is alert.  Psychiatric:        Behavior: Behavior is cooperative.      UC Treatments / Results  Labs (all labs ordered are listed, but only abnormal results are displayed) Labs Reviewed  BASIC METABOLIC PANEL  BRAIN NATRIURETIC PEPTIDE  CBC    EKG   Radiology No results found.  Procedures Procedures (including critical care time)  Medications Ordered in UC Medications - No data to display  Initial Impression / Assessment and Plan / UC Course  I have reviewed the triage vital signs and the nursing notes.  Pertinent labs & imaging results that were available during my care of the patient were reviewed by me and considered in my medical decision  making (see chart for details).     Patient presented with 4-day history of productive cough with green sputum, congestion, sinus pressure/pain.  Denies any other symptoms.  Upon assessment congestion and rhinorrhea are present, mild erythema noted to pharynx. Lungs clear bilaterally on auscultation.  Patient also endorses bilateral lig swelling x2 weeks. Denies shortness of breath, chest pain, and palpations. History of CKD.   Upon  assessment +1 edema noted to bilateral lower extremities without pitting. Ordered CBC, BMP, & BNP to rule out underlying causes of leg edema.   Prescribed Augmentin for maxillary sinusitis. Discussed OTC medication for symptoms. Discussed follow-up, return, and strict ER precautions.  Final Clinical Impressions(s) / UC Diagnoses   Final diagnoses:  Acute non-recurrent maxillary sinusitis  Leg swelling     Discharge Instructions      Start taking Augmentin twice daily for 7 days for sinus infection.  Otherwise alternate between Tylenol and ibuprofen as needed for pain and fever.  I recommend Mucinex as needed for cough and congestion.  Stay hydrated and get some rest.  We have drawn some labs today.  If these results are concerning and require immediate treatment someone will call and let you know.  Otherwise I recommend following up with primary care provider regarding further evaluation of leg swelling.  If you develop severe chest pain, shortness of breath, or worsening leg swelling please seek immediate medical treatment in the ER.   ED Prescriptions     Medication Sig Dispense Auth. Provider   amoxicillin-clavulanate (AUGMENTIN) 875-125 MG tablet Take 1 tablet by mouth every 12 (twelve) hours. 14 tablet Wynonia Lawman A, NP      PDMP not reviewed this encounter.   Wynonia Lawman A, NP 05/16/23 1040

## 2023-05-15 NOTE — ED Triage Notes (Signed)
Patient presents with c/o BLE x 2 weeks. Patient also c/o facial pain x 4 days. Patient also states she has had a non productive cough for three weeks.

## 2023-05-15 NOTE — Discharge Instructions (Signed)
Start taking Augmentin twice daily for 7 days for sinus infection.  Otherwise alternate between Tylenol and ibuprofen as needed for pain and fever.  I recommend Mucinex as needed for cough and congestion.  Stay hydrated and get some rest.  We have drawn some labs today.  If these results are concerning and require immediate treatment someone will call and let you know.  Otherwise I recommend following up with primary care provider regarding further evaluation of leg swelling.  If you develop severe chest pain, shortness of breath, or worsening leg swelling please seek immediate medical treatment in the ER.

## 2023-07-24 ENCOUNTER — Emergency Department (HOSPITAL_COMMUNITY)
Admission: EM | Admit: 2023-07-24 | Discharge: 2023-07-24 | Disposition: A | Discharge status: Home / Self Care | Attending: Emergency Medicine | Admitting: Emergency Medicine

## 2023-07-24 ENCOUNTER — Encounter (HOSPITAL_COMMUNITY): Payer: Self-pay

## 2023-07-24 ENCOUNTER — Other Ambulatory Visit: Payer: Self-pay

## 2023-07-24 DIAGNOSIS — Z8541 Personal history of malignant neoplasm of cervix uteri: Secondary | ICD-10-CM | POA: Insufficient documentation

## 2023-07-24 DIAGNOSIS — J45909 Unspecified asthma, uncomplicated: Secondary | ICD-10-CM | POA: Diagnosis not present

## 2023-07-24 DIAGNOSIS — G43909 Migraine, unspecified, not intractable, without status migrainosus: Secondary | ICD-10-CM | POA: Diagnosis not present

## 2023-07-24 DIAGNOSIS — N189 Chronic kidney disease, unspecified: Secondary | ICD-10-CM | POA: Diagnosis not present

## 2023-07-24 DIAGNOSIS — Z91040 Latex allergy status: Secondary | ICD-10-CM | POA: Diagnosis not present

## 2023-07-24 LAB — BASIC METABOLIC PANEL WITH GFR
Anion gap: 10 (ref 5–15)
BUN: 10 mg/dL (ref 6–20)
CO2: 25 mmol/L (ref 22–32)
Calcium: 8.9 mg/dL (ref 8.9–10.3)
Chloride: 101 mmol/L (ref 98–111)
Creatinine, Ser: 0.73 mg/dL (ref 0.44–1.00)
GFR, Estimated: >60 mL/min (ref >60)
Glucose, Bld: 95 mg/dL (ref 70–99)
Potassium: 4.1 mmol/L (ref 3.5–5.1)
Sodium: 136 mmol/L (ref 135–145)

## 2023-07-24 LAB — CBC
HCT: 30.7 % — ABNORMAL LOW (ref 36.0–46.0)
Hemoglobin: 10 g/dL — ABNORMAL LOW (ref 12.0–15.0)
MCH: 24.3 pg — ABNORMAL LOW (ref 26.0–34.0)
MCHC: 32.6 g/dL (ref 30.0–36.0)
MCV: 74.7 fL — ABNORMAL LOW (ref 80.0–100.0)
Platelets: 494 10*3/uL — ABNORMAL HIGH (ref 150–400)
RBC: 4.11 MIL/uL (ref 3.87–5.11)
RDW: 17.8 % — ABNORMAL HIGH (ref 11.5–15.5)
WBC: 18.1 10*3/uL — ABNORMAL HIGH (ref 4.0–10.5)
nRBC: 0 % (ref 0.0–0.2)

## 2023-07-24 MED ORDER — KETOROLAC TROMETHAMINE 15 MG/ML IJ SOLN
15.0000 mg | Freq: Once | INTRAMUSCULAR | Status: AC
  Administered 2023-07-24: 15 mg via INTRAVENOUS
  Filled 2023-07-24: qty 1

## 2023-07-24 MED ORDER — PROCHLORPERAZINE EDISYLATE 10 MG/2ML IJ SOLN
10.0000 mg | Freq: Once | INTRAMUSCULAR | Status: AC
  Administered 2023-07-24: 10 mg via INTRAVENOUS
  Filled 2023-07-24: qty 2

## 2023-07-24 MED ORDER — SODIUM CHLORIDE 0.9 % IV BOLUS
1000.0000 mL | Freq: Once | INTRAVENOUS | Status: AC
  Administered 2023-07-24: 1000 mL via INTRAVENOUS

## 2023-07-24 MED ORDER — FENTANYL CITRATE PF 50 MCG/ML IJ SOSY
50.0000 ug | PREFILLED_SYRINGE | Freq: Once | INTRAMUSCULAR | Status: AC
  Administered 2023-07-24: 50 ug via INTRAVENOUS
  Filled 2023-07-24: qty 1

## 2023-07-24 NOTE — ED Provider Notes (Signed)
 [place] EMERGENCY DEPARTMENT AT [facility] Provider Note   CSN: [id number] Arrival date & time: 07/24/23  0757     History  Chief Complaint  Patient presents with   Migraine    Pt BIB EMS from home for migraine lasting 6 hours. Took tylenol  for pain with no relief. PMH: migraines, Bipolar    [name] is a 38 y.o. female.   Migraine  Patient with headache.  States she has chronic headaches every day but does get rare headaches like this that are severe.  Began around 8:00 last night.  Has had nausea vomiting.  No relief with Tylenol .  No fever.  No trauma.  Patient is moaning and crying.  States this is typical for her severe headaches.    Past Medical History:  Diagnosis Date   Abortion in first trimester 08/2016   Anemia    Anxiety    Panic attack   Arthritis    Asthma    Bipolar 1 disorder (HCC)    Cancer (HCC)    Cervical cancer (HCC)    Chronic kidney disease    history of kidney shut down  no current problems   Constipation    DDD (degenerative disc disease), cervical    Edema    Gallstones    GERD (gastroesophageal reflux disease)    High cholesterol    History of anemia    History of renal failure    HPV (human papilloma virus) anogenital infection    Leukocytosis    going to hematologist   Migraine    Obesity    Obesity    Pelvic inflammatory disease (PID) 05-08-11   previous hx. .Hx. childbirth x3-NVD   PID (pelvic inflammatory disease)    PTSD (post-traumatic stress disorder)    Sciatica    Scoliosis     Home Medications Prior to Admission medications   Medication Sig Start Date End Date Taking? Authorizing Provider  albuterol  (VENTOLIN  HFA) 108 (90 Base) MCG/ACT inhaler Inhale 1-2 puffs into the lungs every 4 (four) hours as needed for wheezing or shortness of breath. 03/12/23   Ethlyn Herd, MD  amoxicillin -clavulanate (AUGMENTIN ) 875-125 MG tablet Take 1 tablet by mouth every 12 (twelve) hours. 05/15/23    Levora Reas A, NP  Baclofen  5 MG TABS Take 2 tablets (10 mg total) by mouth 2 (two) times daily as needed. 04/02/23   Rising, Ivette Marks, PA-C  benzonatate  (TESSALON ) 200 MG capsule Take 1 capsule (200 mg total) by mouth 3 (three) times daily as needed for cough. 05/03/23   Rodriguez-Southworth, Sylvia, PA-C  clonazePAM (KLONOPIN) 1 MG tablet Take 1 mg by mouth 3 (three) times daily as needed. RAN OUT    [provider]  fluticasone  (FLONASE ) 50 MCG/ACT nasal spray Place 2 sprays into both nostrils daily. 03/12/23   Ethlyn Herd, MD  ondansetron  (ZOFRAN -ODT) 4 MG disintegrating tablet Take 1 tablet (4 mg total) by mouth every 8 (eight) hours as needed for nausea or vomiting. 05/03/23   Rodriguez-Southworth, Sylvia, PA-C  oseltamivir  (TAMIFLU ) 75 MG capsule Take 1 capsule (75 mg total) by mouth daily. 05/03/23   Rodriguez-Southworth, Sylvia, PA-C  rosuvastatin (CRESTOR) 5 MG tablet Take 5 mg by mouth daily.    [provider]  sertraline (ZOLOFT) 100 MG tablet Take 1 tablet by mouth daily.    [provider]  Spacer/Aero-Holding Chambers (AEROCHAMBER MV) inhaler Use as instructed 03/12/23   Ethlyn Herd, MD  triamcinolone  cream (KENALOG ) 0.1 % Apply 1 Application  topically 2 (two) times daily. Apply for 2 weeks. May use on face 03/12/23   Ethlyn Herd, MD      Allergies    Banana, Latex, Bioflavonoids, Hydrocodone , Orange fruit [citrus], and Miconazole    Review of Systems   Review of Systems  Physical Exam Updated Vital Signs BP 105/62   Pulse 85   Temp 97.8 F (36.6 C) (Oral)   Resp 18   Ht 5\' 2"  (1.575 m)   Wt 107 kg   LMP 07/08/2023 (Approximate)   SpO2 96%   BMI 43.16 kg/m  Physical Exam Vitals and nursing note reviewed.  HENT:     Head:     Comments: Patient has earplugs in place. Eyes:     Pupils: Pupils are equal, round, and reactive to light.  Cardiovascular:     Rate and Rhythm: Regular rhythm.  Chest:     Chest wall: No  tenderness.  Abdominal:     Tenderness: There is no abdominal tenderness.  Neurological:     Mental Status: She is alert. Mental status is at baseline.     ED Results / Procedures / Treatments   Labs (all labs ordered are listed, but only abnormal results are displayed) Labs Reviewed  CBC - Abnormal; Notable for the following components:      Result Value   WBC 18.1 (*)    Hemoglobin 10.0 (*)    HCT 30.7 (*)    MCV 74.7 (*)    MCH 24.3 (*)    RDW 17.8 (*)    Platelets 494 (*)    All other components within normal limits  BASIC METABOLIC PANEL WITH GFR    EKG None  Radiology No results found.  Procedures Procedures    Medications Ordered in ED Medications  fentaNYL  (SUBLIMAZE ) injection 50 mcg (50 mcg Intravenous Given 07/24/23 0816)  prochlorperazine  (COMPAZINE ) injection 10 mg (10 mg Intravenous Given 07/24/23 0816)  ketorolac  (TORADOL ) 15 MG/ML injection 15 mg (15 mg Intravenous Given 07/24/23 0816)  sodium chloride  0.9 % bolus 1,000 mL (0 mLs Intravenous Stopped 07/24/23 0930)    ED Course/ Medical Decision Making/ A&P                                 Medical Decision Making Amount and/or Complexity of Data Reviewed Labs: ordered.  Risk Prescription drug management.   Patient with headache.  Reported history of migraines.  Does have chronic headaches.  Reviewed previous CT scan from a year ago.  No abnormalities at time.  Will get some basic blood work but also treat for potential migraine.  Feeling better after treatment.  Headache resolved.  Will discharge home to outpatient follow-up.       Final Clinical Impression(s) / ED Diagnoses Final diagnoses:  Migraine without status migrainosus, not intractable, unspecified migraine type    Rx / DC Orders ED Discharge Orders     None         Mozell Arias, MD 07/24/23 1537

## 2023-08-19 ENCOUNTER — Emergency Department (HOSPITAL_COMMUNITY): Admission: EM | Admit: 2023-08-19 | Discharge: 2023-08-20 | Discharge status: Against Medical Advice

## 2023-08-19 DIAGNOSIS — Z5321 Procedure and treatment not carried out due to patient leaving prior to being seen by health care provider: Secondary | ICD-10-CM | POA: Diagnosis not present

## 2023-08-19 DIAGNOSIS — R519 Headache, unspecified: Secondary | ICD-10-CM | POA: Insufficient documentation

## 2023-08-19 DIAGNOSIS — H9203 Otalgia, bilateral: Secondary | ICD-10-CM | POA: Insufficient documentation

## 2023-08-19 DIAGNOSIS — J3489 Other specified disorders of nose and nasal sinuses: Secondary | ICD-10-CM | POA: Insufficient documentation

## 2023-08-19 MED ORDER — OXYCODONE-ACETAMINOPHEN 5-325 MG PO TABS
1.0000 | ORAL_TABLET | ORAL | Status: DC | PRN
  Administered 2023-08-19: 1 via ORAL
  Filled 2023-08-19: qty 1

## 2023-08-19 NOTE — ED Triage Notes (Signed)
 Pt complains of allergies and sinus pressure that started 2 days ago. Nose and face started to swell. After blowing nose tonight heard a pop and felt pressure in head and ears. Head is throbbing and face is hurting. Pt has taken zyrtec  and Flonase  with no relief.

## 2023-08-20 NOTE — ED Notes (Signed)
 Pt. Stated, "I'm leaving because this is ridiculous." Moved OTF

## 2023-08-22 ENCOUNTER — Encounter (HOSPITAL_COMMUNITY): Payer: Self-pay

## 2023-08-22 ENCOUNTER — Ambulatory Visit (HOSPITAL_COMMUNITY)
Admission: EM | Admit: 2023-08-22 | Discharge: 2023-08-22 | Disposition: A | Discharge status: Home / Self Care | Attending: Family Medicine | Admitting: Family Medicine

## 2023-08-22 DIAGNOSIS — G43911 Migraine, unspecified, intractable, with status migrainosus: Secondary | ICD-10-CM

## 2023-08-22 DIAGNOSIS — J019 Acute sinusitis, unspecified: Secondary | ICD-10-CM | POA: Diagnosis not present

## 2023-08-22 MED ORDER — DEXAMETHASONE SODIUM PHOSPHATE 10 MG/ML IJ SOLN
INTRAMUSCULAR | Status: AC
  Filled 2023-08-22: qty 1

## 2023-08-22 MED ORDER — CEFDINIR 300 MG PO CAPS: 600.0000 mg | ORAL_CAPSULE | Freq: Every day | ORAL | 0 refills | Status: AC

## 2023-08-22 MED ORDER — KETOROLAC TROMETHAMINE 30 MG/ML IJ SOLN
30.0000 mg | Freq: Once | INTRAMUSCULAR | Status: AC
  Administered 2023-08-22: 30 mg via INTRAMUSCULAR

## 2023-08-22 MED ORDER — SUMATRIPTAN SUCCINATE 6 MG/0.5ML ~~LOC~~ SOLN
SUBCUTANEOUS | Status: AC
Start: 2023-08-22 — End: ?
  Filled 2023-08-22: qty 0.5

## 2023-08-22 MED ORDER — KETOROLAC TROMETHAMINE 30 MG/ML IJ SOLN
INTRAMUSCULAR | Status: AC
Start: 2023-08-22 — End: ?
  Filled 2023-08-22: qty 1

## 2023-08-22 MED ORDER — SUMATRIPTAN SUCCINATE 6 MG/0.5ML ~~LOC~~ SOLN
6.0000 mg | Freq: Once | SUBCUTANEOUS | Status: AC
  Administered 2023-08-22: 6 mg via SUBCUTANEOUS

## 2023-08-22 MED ORDER — DEXAMETHASONE SODIUM PHOSPHATE 10 MG/ML IJ SOLN
10.0000 mg | Freq: Once | INTRAMUSCULAR | Status: AC
  Administered 2023-08-22: 10 mg via INTRAMUSCULAR

## 2023-08-22 NOTE — ED Provider Notes (Signed)
 [facility]    CSN: [id number] Arrival date & time: 08/22/23  1827      History   Chief Complaint Chief Complaint  Patient presents with   Sinus Problem   Migraine    HPI [name] is a 38 y.o. female.    Sinus Problem  Migraine  Here for 1 week history of sinus pressure and migraine headache and postnasal drainage and nasal congestion.  No fever.  She does have a history of migraines and Imitrex from someone else's supply has helped briefly.  The sinus pressure continues though.  No vomiting or diarrhea and no nausea.  Last menstrual cycle was May 14.  Past Medical History:  Diagnosis Date   Abortion in first trimester 08/2016   Anemia    Anxiety    Panic attack   Arthritis    Asthma    Bipolar 1 disorder (HCC)    Cancer (HCC)    Cervical cancer (HCC)    Chronic kidney disease    history of kidney shut down  no current problems   Constipation    DDD (degenerative disc disease), cervical    Edema    Gallstones    GERD (gastroesophageal reflux disease)    High cholesterol    History of anemia    History of renal failure    HPV (human papilloma virus) anogenital infection    Leukocytosis    going to hematologist   Migraine    Obesity    Obesity    Pelvic inflammatory disease (PID) 05-08-11   previous hx. .Hx. childbirth x3-NVD   PID (pelvic inflammatory disease)    PTSD (post-traumatic stress disorder)    Sciatica    Scoliosis     Patient Active Problem List   Diagnosis Date Noted   CAP (community acquired pneumonia) 07/19/2021   Vaginal delivery 11/16/2017   Anemia in pregnancy 08/30/2017   Unwanted fertility 08/23/2017   Obesity affecting pregnancy, antepartum 06/19/2017   Supervision of high risk pregnancy, antepartum, second trimester 05/29/2017   History of preterm delivery, currently pregnant in second trimester 05/29/2017   Tobacco use in pregnancy, antepartum, second trimester 05/29/2017   Drug use affecting  pregnancy in second trimester 05/29/2017   History of premature delivery 08/10/2016   Bipolar disorder (HCC) 08/07/2016   Herniated nucleus pulposus, L4-5 left 11/30/2014   Chronic pain syndrome 10/28/2012   Neck pain 10/28/2012   Back pain 10/28/2012   Osteoarthritis 10/28/2012   Migraine    Asthma     Past Surgical History:  Procedure Laterality Date   ABDOMINAL EXPLORATION SURGERY  approx 38 years old   rlq(due to adhesions around ovaries)-appendix and ovaries remain   ANTERIOR CERVICAL DECOMP/DISCECTOMY FUSION N/A 12/09/2012   Procedure: ANTERIOR CERVICAL DECOMPRESSION/DISCECTOMY FUSION STRUCTURAL ALLOGRAFT TRESTLE PLATE CERVICAL FIVE-SIX,SIX-SEVEN;  Surgeon: Shary Deems, MD;  Location: [facility];  Service: Neurosurgery;  Laterality: N/A;   BACK SURGERY     CHOLECYSTECTOMY  05/10/2011   Procedure: LAPAROSCOPIC CHOLECYSTECTOMY WITH INTRAOPERATIVE CHOLANGIOGRAM;  Surgeon: Fran Imus, MD,FACS;  Location: [facility];  Service: General;  Laterality: N/A;   LUMBAR LAMINECTOMY/DECOMPRESSION MICRODISCECTOMY Left 11/30/2014   Procedure: Left lumbar four-five Microdiskectomy;  Surgeon: Elna Haggis, MD;  Location: [facility];  Service: Neurosurgery;  Laterality: Left;   LUMBAR LAMINECTOMY/DECOMPRESSION MICRODISCECTOMY Left 08/15/2015   Procedure: Left Lumbar four-five Microdiskectomy;  Surgeon: Elna Haggis, MD;  Location: [facility];  Service: Neurosurgery;  Laterality: Left;   LUMBAR LAMINECTOMY/DECOMPRESSION MICRODISCECTOMY Left  09/28/2016   Procedure: LEFT LUMBAR FIVE -SACRAL ONE Microdiscectomy;  Surgeon: Elna Haggis, MD;  Location: [facility];  Service: Neurosurgery;  Laterality: Left;   TUBAL LIGATION Bilateral 11/16/2017   Procedure: POST PARTUM TUBAL LIGATION;  Surgeon: Raynell Caller, MD;  Location: [facility];  Service: Gynecology;  Laterality: Bilateral;    OB History     Gravida  6   Para  4   Term  3   Preterm  1   AB  2   Living  4      SAB  2   IAB       Ectopic      Multiple  0   Live Births  4            Home Medications    Prior to Admission medications   Medication Sig Start Date End Date Taking? Authorizing Provider  albuterol  (VENTOLIN  HFA) 108 (90 Base) MCG/ACT inhaler Inhale 1-2 puffs into the lungs every 4 (four) hours as needed for wheezing or shortness of breath. 03/12/23  Yes Ethlyn Herd, MD  Baclofen  5 MG TABS Take 2 tablets (10 mg total) by mouth 2 (two) times daily as needed. 04/02/23  Yes Rising, Ivette Marks, PA-C  cefdinir  (OMNICEF ) 300 MG capsule Take 2 capsules (600 mg total) by mouth daily for 7 days. 08/22/23 08/29/23 Yes Justine Dines K, MD  clonazePAM (KLONOPIN) 1 MG tablet Take 1 mg by mouth 3 (three) times daily as needed. RAN OUT   Yes [provider]  fluticasone  (FLONASE ) 50 MCG/ACT nasal spray Place 2 sprays into both nostrils daily. 03/12/23  Yes Ethlyn Herd, MD  ondansetron  (ZOFRAN -ODT) 4 MG disintegrating tablet Take 1 tablet (4 mg total) by mouth every 8 (eight) hours as needed for nausea or vomiting. 05/03/23  Yes Rodriguez-Southworth, Sylvia, PA-C  sertraline (ZOLOFT) 100 MG tablet Take 0.5 tablets by mouth daily.   Yes [provider]  Spacer/Aero-Holding Chambers (AEROCHAMBER MV) inhaler Use as instructed 03/12/23  Yes Ethlyn Herd, MD  triamcinolone  cream (KENALOG ) 0.1 % Apply 1 Application topically 2 (two) times daily. Apply for 2 weeks. May use on face 03/12/23  Yes Mortenson, Ashley, MD  rosuvastatin (CRESTOR) 5 MG tablet Take 5 mg by mouth daily.    [provider]    Family History Family History  Problem Relation Age of Onset   Diabetes Mother    Endometriosis Mother    Cancer Mother        lung   Diabetes Father    Heart disease Father    Prostate cancer Maternal Grandfather    Cancer Maternal Grandfather        prostate   Colon polyps Maternal Grandmother    Cirrhosis Paternal Grandfather     Social History Social History   Tobacco  Use   Smoking status: Every Day    Current packs/day: 1.00    Average packs/day: 1 pack/day for 12.0 years (12.0 ttl pk-yrs)    Types: Cigarettes   Smokeless tobacco: Never   Tobacco comments:    trying to quit  Vaping Use   Vaping status: Never Used  Substance Use Topics   Alcohol use: Not Currently    Comment: occasionally   Drug use: Not Currently    Types: Marijuana, Cocaine    Comment: cocaine she states is rec, she states she stopped marijuana 4 months ago     Allergies   Banana, Latex, Bioflavonoids, Hydrocodone , Orange fruit [citrus], and Miconazole   Review  of Systems Review of Systems   Physical Exam Triage Vital Signs ED Triage Vitals  Encounter Vitals Group     BP 08/22/23 1941 125/85     Systolic BP Percentile --      Diastolic BP Percentile --      Pulse Rate 08/22/23 1941 75     Resp 08/22/23 1941 18     Temp 08/22/23 1941 98.2 F (36.8 C)     Temp Source 08/22/23 1941 Oral     SpO2 08/22/23 1941 96 %     Weight --      Height --      Head Circumference --      Peak Flow --      Pain Score 08/22/23 1936 6     Pain Loc --      Pain Education --      Exclude from Growth Chart --    No data found.  Updated Vital Signs BP 125/85 (BP Location: Left Arm)   Pulse 75   Temp 98.2 F (36.8 C) (Oral)   Resp 18   LMP 08/07/2023 (Approximate)   SpO2 96%   Visual Acuity Right Eye Distance:   Left Eye Distance:   Bilateral Distance:    Right Eye Near:   Left Eye Near:    Bilateral Near:     Physical Exam Vitals reviewed.  Constitutional:      General: She is not in acute distress.    Appearance: She is not toxic-appearing.  HENT:     Right Ear: Tympanic membrane and ear canal normal.     Left Ear: Tympanic membrane and ear canal normal.     Nose: Congestion present.     Mouth/Throat:     Mouth: Mucous membranes are moist.     Pharynx: No posterior oropharyngeal erythema.     Comments: There is clear mucus draining Eyes:      Extraocular Movements: Extraocular movements intact.     Conjunctiva/sclera: Conjunctivae normal.     Pupils: Pupils are equal, round, and reactive to light.  Cardiovascular:     Rate and Rhythm: Normal rate and regular rhythm.     Heart sounds: No murmur heard. Pulmonary:     Effort: Pulmonary effort is normal. No respiratory distress.     Breath sounds: No stridor. No wheezing, rhonchi or rales.  Musculoskeletal:     Cervical back: Neck supple.  Lymphadenopathy:     Cervical: No cervical adenopathy.  Skin:    Capillary Refill: Capillary refill takes less than 2 seconds.     Coloration: Skin is not jaundiced or pale.  Neurological:     General: No focal deficit present.     Mental Status: She is alert and oriented to person, place, and time.  Psychiatric:        Behavior: Behavior normal.      UC Treatments / Results  Labs (all labs ordered are listed, but only abnormal results are displayed) Labs Reviewed - No data to display  EKG   Radiology No results found.  Procedures Procedures (including critical care time)  Medications Ordered in UC Medications  ketorolac  (TORADOL ) 30 MG/ML injection 30 mg (30 mg Intramuscular Given 08/22/23 2024)  dexamethasone  (DECADRON ) injection 10 mg (10 mg Intramuscular Given 08/22/23 2024)  SUMAtriptan (IMITREX) injection 6 mg (6 mg Subcutaneous Given 08/22/23 2025)    Initial Impression / Assessment and Plan / UC Course  I have reviewed the triage vital signs and the nursing notes.  Pertinent labs & imaging results that were available during my care of the patient were reviewed by me and considered in my medical decision making (see chart for details).      Headache cocktail consisting of Toradol , Decadron , and Imitrex is given here as an injection.  Omnicef  is sent in for the sinusitis.  I have asked her to follow-up with her primary care about her headaches. Final Clinical Impressions(s) / UC Diagnoses   Final diagnoses:   Intractable migraine with status migrainosus, unspecified migraine type  Acute non-recurrent sinusitis, unspecified location     Discharge Instructions      You have been given a shot of Toradol  30 mg, dexamethasone  10 mg, and sumatriptan/Imitrex 6 mg today.  Take cefdinir  300 mg--2 capsules together daily for 7 days  Please follow-up with your primary care about these persistent and recurrent headaches.   ED Prescriptions     Medication Sig Dispense Auth. Provider   cefdinir  (OMNICEF ) 300 MG capsule Take 2 capsules (600 mg total) by mouth daily for 7 days. 14 capsule Ellsworth Haas, Yadir Zentner K, MD      PDMP not reviewed this encounter.   Ann Keto, MD 08/22/23 2038

## 2023-08-22 NOTE — Discharge Instructions (Signed)
 You have been given a shot of Toradol  30 mg, dexamethasone  10 mg, and sumatriptan/Imitrex 6 mg today.  Take cefdinir  300 mg--2 capsules together daily for 7 days  Please follow-up with your primary care about these persistent and recurrent headaches.

## 2023-08-22 NOTE — ED Triage Notes (Signed)
 Patient presenting with facial/sinus pain and headache onset 1 week ago. Patient has history of migraine. Was getting injection but insurance no longer covers this. Patient also has history of seasonal allergies.  Also having a yeast like rash on the lower abdomin.  Prescriptions or OTC medications tried: Yes- Percocet today   with mild relief of migraine but not facial pressure.

## 2023-08-27 ENCOUNTER — Encounter (HOSPITAL_COMMUNITY): Payer: Self-pay

## 2023-08-27 ENCOUNTER — Other Ambulatory Visit: Payer: Self-pay

## 2023-08-27 ENCOUNTER — Emergency Department (HOSPITAL_COMMUNITY)
Admission: EM | Admit: 2023-08-27 | Discharge: 2023-08-27 | Disposition: A | Discharge status: Home / Self Care | Attending: Emergency Medicine | Admitting: Emergency Medicine

## 2023-08-27 ENCOUNTER — Emergency Department (HOSPITAL_COMMUNITY)

## 2023-08-27 DIAGNOSIS — J3489 Other specified disorders of nose and nasal sinuses: Secondary | ICD-10-CM | POA: Insufficient documentation

## 2023-08-27 DIAGNOSIS — G43909 Migraine, unspecified, not intractable, without status migrainosus: Secondary | ICD-10-CM | POA: Diagnosis not present

## 2023-08-27 DIAGNOSIS — R519 Headache, unspecified: Secondary | ICD-10-CM | POA: Diagnosis present

## 2023-08-27 DIAGNOSIS — D72829 Elevated white blood cell count, unspecified: Secondary | ICD-10-CM | POA: Insufficient documentation

## 2023-08-27 DIAGNOSIS — Z91040 Latex allergy status: Secondary | ICD-10-CM | POA: Diagnosis not present

## 2023-08-27 DIAGNOSIS — J45909 Unspecified asthma, uncomplicated: Secondary | ICD-10-CM | POA: Diagnosis not present

## 2023-08-27 LAB — BASIC METABOLIC PANEL WITH GFR
Anion gap: 11 (ref 5–15)
BUN: 6 mg/dL (ref 6–20)
CO2: 23 mmol/L (ref 22–32)
Calcium: 9 mg/dL (ref 8.9–10.3)
Chloride: 102 mmol/L (ref 98–111)
Creatinine, Ser: 0.79 mg/dL (ref 0.44–1.00)
GFR, Estimated: >60 mL/min (ref >60)
Glucose, Bld: 99 mg/dL (ref 70–99)
Potassium: 3.8 mmol/L (ref 3.5–5.1)
Sodium: 136 mmol/L (ref 135–145)

## 2023-08-27 LAB — CBC WITH DIFFERENTIAL/PLATELET
Abs Immature Granulocytes: 0.17 10*3/uL — ABNORMAL HIGH (ref 0.00–0.07)
Basophils Absolute: 0.1 10*3/uL (ref 0.0–0.1)
Basophils Relative: 0 %
Eosinophils Absolute: 0.7 10*3/uL — ABNORMAL HIGH (ref 0.0–0.5)
Eosinophils Relative: 3 %
HCT: 35.3 % — ABNORMAL LOW (ref 36.0–46.0)
Hemoglobin: 11.3 g/dL — ABNORMAL LOW (ref 12.0–15.0)
Immature Granulocytes: 1 %
Lymphocytes Relative: 19 %
Lymphs Abs: 3.7 10*3/uL (ref 0.7–4.0)
MCH: 24.7 pg — ABNORMAL LOW (ref 26.0–34.0)
MCHC: 32 g/dL (ref 30.0–36.0)
MCV: 77.1 fL — ABNORMAL LOW (ref 80.0–100.0)
Monocytes Absolute: 1.1 10*3/uL — ABNORMAL HIGH (ref 0.1–1.0)
Monocytes Relative: 6 %
Neutro Abs: 13.6 10*3/uL — ABNORMAL HIGH (ref 1.7–7.7)
Neutrophils Relative %: 71 %
Platelets: 428 10*3/uL — ABNORMAL HIGH (ref 150–400)
RBC: 4.58 MIL/uL (ref 3.87–5.11)
RDW: 19.7 % — ABNORMAL HIGH (ref 11.5–15.5)
WBC: 19.2 10*3/uL — ABNORMAL HIGH (ref 4.0–10.5)
nRBC: 0 % (ref 0.0–0.2)

## 2023-08-27 MED ORDER — PREDNISONE 10 MG (21) PO TBPK
ORAL_TABLET | Freq: Every day | ORAL | 0 refills | Status: DC
Start: 2023-08-27 — End: 2023-10-31

## 2023-08-27 MED ORDER — ACETAMINOPHEN 500 MG PO TABS
1000.0000 mg | ORAL_TABLET | Freq: Once | ORAL | Status: AC
  Administered 2023-08-27: 1000 mg via ORAL
  Filled 2023-08-27: qty 2

## 2023-08-27 MED ORDER — KETOROLAC TROMETHAMINE 15 MG/ML IJ SOLN
15.0000 mg | Freq: Once | INTRAMUSCULAR | Status: AC
  Administered 2023-08-27: 15 mg via INTRAMUSCULAR
  Filled 2023-08-27: qty 1

## 2023-08-27 MED ORDER — PROCHLORPERAZINE MALEATE 5 MG PO TABS: 10.0000 mg | ORAL_TABLET | Freq: Once | ORAL | Status: DC

## 2023-08-27 MED ORDER — DIPHENHYDRAMINE HCL 25 MG PO CAPS
25.0000 mg | ORAL_CAPSULE | Freq: Once | ORAL | Status: AC
  Administered 2023-08-27: 25 mg via ORAL
  Filled 2023-08-27: qty 1

## 2023-08-27 MED ORDER — ONDANSETRON 4 MG PO TBDP
4.0000 mg | ORAL_TABLET | Freq: Once | ORAL | Status: AC
  Administered 2023-08-27: 4 mg via ORAL
  Filled 2023-08-27: qty 1

## 2023-08-27 MED ORDER — PROCHLORPERAZINE EDISYLATE 10 MG/2ML IJ SOLN: 10.0000 mg | Freq: Once | INTRAMUSCULAR | Status: DC

## 2023-08-27 MED ORDER — PROCHLORPERAZINE EDISYLATE 10 MG/2ML IJ SOLN
10.0000 mg | Freq: Once | INTRAMUSCULAR | Status: AC
  Administered 2023-08-27: 10 mg via INTRAMUSCULAR
  Filled 2023-08-27: qty 2

## 2023-08-27 MED ORDER — DIPHENHYDRAMINE HCL 50 MG/ML IJ SOLN: 12.5000 mg | Freq: Once | INTRAMUSCULAR | Status: DC

## 2023-08-27 NOTE — ED Notes (Signed)
 Awaiting patient from lobby.

## 2023-08-27 NOTE — ED Provider Notes (Signed)
 [place] [facility] AT [facility] Provider Note   CSN: [id number] Arrival date & time: 08/27/23  4098     History  Chief Complaint  Patient presents with   Headache    [name] is a 38 y.o. female presents to the emergency department via EMS with a chief complaint of sinusitis and headache.  Headache in triage stated as 10 out of 10 pain.  She has past medical history significant for migraine, asthma, chronic pain syndrome, neck pain.  Per chart review patient has had multiple visits concerning migraines and chronic headaches.  Was recently seen last week on 08/22/2023 in urgent care for migraine as well as sinus pain/pressure.  Patient given antibiotic for suspected sinus infection and migraine treated.  Today patient denies chest pain, shortness of breath, abdominal pain, or any other symptom other than sinus pain/pressure as well as migraine.   Headache Associated symptoms: congestion, drainage and sinus pressure   Associated symptoms: no abdominal pain, no diarrhea, no dizziness, no ear pain, no fever, no hearing loss, no nausea, no neck pain, no numbness, no seizures, no sore throat, no vomiting and no weakness        Home Medications Prior to Admission medications   Medication Sig Start Date End Date Taking? Authorizing Provider  albuterol  (VENTOLIN  HFA) 108 (90 Base) MCG/ACT inhaler Inhale 1-2 puffs into the lungs every 4 (four) hours as needed for wheezing or shortness of breath. 03/12/23   Ethlyn Herd, MD  Baclofen  5 MG TABS Take 2 tablets (10 mg total) by mouth 2 (two) times daily as needed. 04/02/23   Rising, Ivette Marks, PA-C  cefdinir  (OMNICEF ) 300 MG capsule Take 2 capsules (600 mg total) by mouth daily for 7 days. 08/22/23 08/29/23  Banister, Pamela K, MD  clonazePAM (KLONOPIN) 1 MG tablet Take 1 mg by mouth 3 (three) times daily as needed. RAN OUT    [provider]  fluticasone  (FLONASE ) 50 MCG/ACT nasal spray Place 2 sprays into  both nostrils daily. 03/12/23   Ethlyn Herd, MD  ondansetron  (ZOFRAN -ODT) 4 MG disintegrating tablet Take 1 tablet (4 mg total) by mouth every 8 (eight) hours as needed for nausea or vomiting. 05/03/23   Rodriguez-Southworth, Lamond Pilot, PA-C  predniSONE  (STERAPRED UNI-PAK 21 TAB) 10 MG (21) TBPK tablet Take by mouth daily. Take 6 tabs by mouth daily  for 2 days, then 5 tabs for 2 days, then 4 tabs for 2 days, then 3 tabs for 2 days, 2 tabs for 2 days, then 1 tab by mouth daily for 2 days 08/27/23  Yes Aneth Schlagel F, PA-C  rosuvastatin (CRESTOR) 5 MG tablet Take 5 mg by mouth daily.    [provider]  sertraline (ZOLOFT) 100 MG tablet Take 0.5 tablets by mouth daily.    [provider]  Spacer/Aero-Holding Chambers (AEROCHAMBER MV) inhaler Use as instructed 03/12/23   Ethlyn Herd, MD  triamcinolone  cream (KENALOG ) 0.1 % Apply 1 Application topically 2 (two) times daily. Apply for 2 weeks. May use on face 03/12/23   Ethlyn Herd, MD      Allergies    Banana, Latex, Bioflavonoids, Hydrocodone , Orange fruit [citrus], and Miconazole    Review of Systems   Review of Systems  Constitutional:  Negative for chills and fever.  HENT:  Positive for congestion, postnasal drip, rhinorrhea, sinus pressure and sinus pain. Negative for ear pain, hearing loss, sneezing, sore throat, tinnitus and trouble swallowing.   Eyes:  Negative for discharge and visual  disturbance.  Respiratory:  Negative for chest tightness, shortness of breath and wheezing.   Cardiovascular:  Negative for chest pain and palpitations.  Gastrointestinal:  Negative for abdominal pain, constipation, diarrhea, nausea and vomiting.  Musculoskeletal:  Negative for neck pain.  Skin:  Negative for rash and wound.  Neurological:  Positive for headaches. Negative for dizziness, tremors, seizures, syncope, weakness and numbness.    Physical Exam Updated Vital Signs BP (!) 142/101 (BP Location: Left Arm)   Pulse  85   Temp 98.3 F (36.8 C)   Resp 17   Ht 5\' 2"  (1.575 m)   Wt 107 kg   LMP 08/07/2023 (Approximate)   SpO2 99%   BMI 43.15 kg/m  Physical Exam Vitals and nursing note reviewed.  Constitutional:      General: She is awake. She is not in acute distress.    Appearance: Normal appearance. She is well-developed. She is not ill-appearing, toxic-appearing or diaphoretic.  HENT:     Head: Normocephalic and atraumatic.  Eyes:     General: No scleral icterus.    Pupils: Pupils are equal, round, and reactive to light.  Cardiovascular:     Rate and Rhythm: Normal rate and regular rhythm.     Heart sounds: Normal heart sounds.  Pulmonary:     Effort: Pulmonary effort is normal. No respiratory distress.     Breath sounds: Normal breath sounds. No wheezing, rhonchi or rales.  Musculoskeletal:        General: Normal range of motion.     Cervical back: Normal range of motion.  Skin:    General: Skin is warm and dry.     Capillary Refill: Capillary refill takes less than 2 seconds.  Neurological:     General: No focal deficit present.     Mental Status: She is alert and oriented to person, place, and time.     GCS: GCS eye subscore is 4. GCS verbal subscore is 5. GCS motor subscore is 6.     Cranial Nerves: No cranial nerve deficit.     Motor: Motor function is intact.     Coordination: Coordination is intact.  Psychiatric:        Mood and Affect: Mood normal.        Behavior: Behavior normal. Behavior is cooperative.     ED Results / Procedures / Treatments   Labs (all labs ordered are listed, but only abnormal results are displayed) Labs Reviewed  CBC WITH DIFFERENTIAL/PLATELET - Abnormal; Notable for the following components:      Result Value   WBC 19.2 (*)    Hemoglobin 11.3 (*)    HCT 35.3 (*)    MCV 77.1 (*)    MCH 24.7 (*)    RDW 19.7 (*)    Platelets 428 (*)    Neutro Abs 13.6 (*)    Monocytes Absolute 1.1 (*)    Eosinophils Absolute 0.7 (*)    Abs Immature  Granulocytes 0.17 (*)    All other components within normal limits  BASIC METABOLIC PANEL WITH GFR    EKG None  Radiology CT Head Wo Contrast Result Date: 08/27/2023 CLINICAL DATA:  Headaches with increasing frequency. EXAM: CT HEAD WITHOUT CONTRAST TECHNIQUE: Contiguous axial images were obtained from the base of the skull through the vertex without intravenous contrast. RADIATION DOSE REDUCTION: This exam was performed according to the departmental dose-optimization program which includes automated exposure control, adjustment of the mA and/or kV according to patient size and/or use of  iterative reconstruction technique. COMPARISON:  09/20/2022 FINDINGS: Brain: No evidence of acute infarction, hemorrhage, hydrocephalus, extra-axial collection or mass lesion/mass effect. Vascular: No hyperdense vessel or unexpected calcification. Skull: No signs of skull fracture or focal lesion. Sinuses/Orbits: Mucosal thickening is noted involving both maxillary sinuses. No air-fluid levels. Partial opacification of the ethmoid air cells with mucosal thickening along the nasal septum. Mastoid air cells are clear Other: None. IMPRESSION: 1. No acute intracranial abnormalities. 2. Chronic sinusitis. Electronically Signed   By: Kimberley Penman M.D.   On: 08/27/2023 08:25    Procedures Procedures    Medications Ordered in ED Medications  ketorolac  (TORADOL ) 15 MG/ML injection 15 mg (15 mg Intramuscular Given 08/27/23 0704)  ondansetron  (ZOFRAN -ODT) disintegrating tablet 4 mg (4 mg Oral Given 08/27/23 0705)  acetaminophen  (TYLENOL ) tablet 1,000 mg (1,000 mg Oral Given 08/27/23 0705)  diphenhydrAMINE  (BENADRYL ) capsule 25 mg (25 mg Oral Given 08/27/23 1245)  prochlorperazine  (COMPAZINE ) injection 10 mg (10 mg Intramuscular Given 08/27/23 1250)    ED Course/ Medical Decision Making/ A&P     Patient presents to the ED for concern of migraine and sinus pressure, this involves an extensive number of treatment options, and  is a complaint that carries with it a high risk of complications and morbidity.  The differential diagnosis includes chronic history of migraines, stroke, brain hemorrhage, meningitis, viral syndrome, bacterial sinus infection, etc.   Co morbidities that complicate the patient evaluation  Neck pain syndrome, history of migraines and headache   Additional history obtained:  Additional history obtained from previous urgent care visit as well as past medical admissions for similar complaint   External records from outside source obtained and reviewed including recent UC visit as well as multiple visits to ED for similar complaints multiple   Lab Tests:  I Ordered, and personally interpreted labs.  The pertinent results include:  CBC-white blood cell 19.2, hemoglobin 11.3, BMP unremarkable   Imaging Studies ordered:  I ordered imaging studies including CT head without contrast I independently visualized and interpreted imaging which showed:  1. No acute intracranial abnormalities.  2. Chronic sinusitis.   I agree with the radiologist interpretation   Medicines ordered and prescription drug management:  I ordered medication including compazine , benadryl  for migraine, steroids for outpatient therapy Reevaluation of the patient after these medicines showed that the patient improved I have reviewed the patients home medicines and have made adjustments as needed    Problem List / ED Course:  Patient presents via EMS due to severe headache and sinusitis Patient has has been history significant for headache, migraine Recent urgent care visit on 08/22/2023 for migraine urgent care as well as sinus pressure, prescribed cefdinir  antibiotic Patient presents again today with migraine as well as sinus pain/pressure, analgesics given for migraine with improvement after Compazine  and Benadryl  Patient instructed to continue course of antibiotics for suspected sinus infection Steroid Dosepak  prescribed today to help with inflammation of sinuses Patient given return precautions Patient discharged and educated to make sure PCP gets results of todays visit, educated on scan results and lab work today.    Reevaluation:  After the interventions noted above, I reevaluated the patient and found that they have :improved   Social Determinants of Health:  none   Dispostion:  After consideration of the diagnostic results and the patients response to treatment, I feel that the patent would benefit from discharge and continued antibiotic therapy. Take steroids as prescribed outpatient. F/u with PCP for ongoing diagnosis and treatment in  reference to chronic sinusitis.   Click here for ABCD2, HEART and other calculatorsREFRESH Note before signing :1}                              Medical Decision Making         Final Clinical Impression(s) / ED Diagnoses Final diagnoses:  Migraine without status migrainosus, not intractable, unspecified migraine type  Sinus pain    Rx / DC Orders ED Discharge Orders          Ordered    predniSONE  (STERAPRED UNI-PAK 21 TAB) 10 MG (21) TBPK tablet  Daily        08/27/23 1335           Complaint including migraine/headache   Fonda Hymen, PA-C 08/27/23 1347    Lowery Rue, DO 08/27/23 1509

## 2023-08-27 NOTE — ED Provider Triage Note (Signed)
 Emergency Medicine Provider Triage Evaluation Note  [name] , a 38 y.o. female  was evaluated in triage.  Pt complains of facial pain since yesterday. States that she went to UC and received ABX. Last dose of tylenol  around 11PM last night.   Review of Systems  Positive:  Negative:   Physical Exam  BP (!) 130/111   Temp 98.6 F (37 C)   Resp (!) 24   LMP 08/07/2023 (Approximate)   SpO2 90%  Gen:   Awake, no distress   Resp:  Normal effort  MSK:   Moves extremities without difficulty  Other:    Medical Decision Making  Medically screening exam initiated at 6:39 AM.  Appropriate orders placed.  [name] was informed that the remainder of the evaluation will be completed by another provider, this initial triage assessment does not replace that evaluation, and the importance of remaining in the ED until their evaluation is complete.   Dorisann Garre F, [place] 08/27/23 [id number]

## 2023-08-27 NOTE — Discharge Instructions (Addendum)
 It was a pleasure taking care of you today.  Today I evaluated you for your suspected migraine as well as sinus pain/pressure.  Because you are recently seen in the urgent care and prescribed antibiotics, I recommend continuing to take these antibiotics as prescribed.  Today I have also prescribed you a steroid taper which can be taken at home.  This steroid should help reduce sinus inflammation and pressure.  Please return to the emergency department if you experience any following symptoms included but not limited to severe headache, dizziness, weakness, chest pain, shortness of breath, issues with gait, visual disturbances. Please take your medication as prescribed.

## 2023-08-27 NOTE — ED Notes (Signed)
 Extra red top drawn

## 2023-08-27 NOTE — ED Triage Notes (Signed)
 Pt arrived from home via [facility] c/o sinusitis and headache x 2300 08/26/2023 head ache is a 10/10

## 2023-10-07 ENCOUNTER — Ambulatory Visit (HOSPITAL_COMMUNITY)
Admission: EM | Admit: 2023-10-07 | Discharge: 2023-10-07 | Disposition: A | Discharge status: Home / Self Care | Attending: Emergency Medicine | Admitting: Emergency Medicine

## 2023-10-07 ENCOUNTER — Encounter (HOSPITAL_COMMUNITY): Payer: Self-pay | Admitting: Emergency Medicine

## 2023-10-07 DIAGNOSIS — F1410 Cocaine abuse, uncomplicated: Secondary | ICD-10-CM

## 2023-10-07 DIAGNOSIS — J0101 Acute recurrent maxillary sinusitis: Secondary | ICD-10-CM | POA: Diagnosis not present

## 2023-10-07 MED ORDER — DOXYCYCLINE HYCLATE 100 MG PO CAPS: 100.0000 mg | ORAL_CAPSULE | Freq: Two times a day (BID) | ORAL | 0 refills | Status: DC

## 2023-10-07 MED ORDER — KETOROLAC TROMETHAMINE 30 MG/ML IJ SOLN
INTRAMUSCULAR | Status: AC
  Filled 2023-10-07: qty 1

## 2023-10-07 MED ORDER — KETOROLAC TROMETHAMINE 30 MG/ML IJ SOLN
30.0000 mg | Freq: Once | INTRAMUSCULAR | Status: AC
  Administered 2023-10-07: 30 mg via INTRAMUSCULAR

## 2023-10-07 NOTE — ED Triage Notes (Signed)
 Pt reports had facial and sinus pain for 4-5 days. Today took Dayquil and Benadryl  and when was going in and out of heat to [facility] blew her nose and had lots of scabs and green mucous, then water out of my eye. Has tried Tylenol  sinus without any relief.  Reports around week ago when was in the shower with her daughter, her daughter told her that she had a spot on her back. Pt denies any known injury to cause the bruise on her back.

## 2023-10-07 NOTE — ED Provider Notes (Signed)
 [facility]    CSN: [id number] Arrival date & time: 10/07/23  1828      History   Chief Complaint Chief Complaint  Patient presents with   Nasal Congestion   Facial Pain    HPI [name] is a 38 y.o. female.   Patient presents to clinic over concern for severe facial and sinus pain that has been present for the past 5 days or so.  Has tried DayQuil and Benadryl  without much improvement.  Feels like she has some scabs in her nose and when she blows her nose it opens back up and the pain starts again.  Has tried Tylenol  sinuses well.  Has not had any fevers.  Last used cocaine 2 weeks ago.  Has had bilateral leg swelling that her primary care cannot figure out.  Also has a bruise on her back that she is unsure how that got there.  The history is provided by the patient and medical records.    Past Medical History:  Diagnosis Date   Abortion in first trimester 08/2016   Anemia    Anxiety    Panic attack   Arthritis    Asthma    Bipolar 1 disorder (HCC)    Cancer (HCC)    Cervical cancer (HCC)    Chronic kidney disease    history of kidney shut down  no current problems   Constipation    DDD (degenerative disc disease), cervical    Edema    Gallstones    GERD (gastroesophageal reflux disease)    High cholesterol    History of anemia    History of renal failure    HPV (human papilloma virus) anogenital infection    Leukocytosis    going to hematologist   Migraine    Obesity    Obesity    Pelvic inflammatory disease (PID) 05-08-11   previous hx. .Hx. childbirth x3-NVD   PID (pelvic inflammatory disease)    PTSD (post-traumatic stress disorder)    Sciatica    Scoliosis     Patient Active Problem List   Diagnosis Date Noted   CAP (community acquired pneumonia) 07/19/2021   Vaginal delivery 11/16/2017   Anemia in pregnancy 08/30/2017   Unwanted fertility 08/23/2017   Obesity affecting pregnancy, antepartum 06/19/2017   Supervision of  high risk pregnancy, antepartum, second trimester 05/29/2017   History of preterm delivery, currently pregnant in second trimester 05/29/2017   Tobacco use in pregnancy, antepartum, second trimester 05/29/2017   Drug use affecting pregnancy in second trimester 05/29/2017   History of premature delivery 08/10/2016   Bipolar disorder (HCC) 08/07/2016   Herniated nucleus pulposus, L4-5 left 11/30/2014   Chronic pain syndrome 10/28/2012   Neck pain 10/28/2012   Back pain 10/28/2012   Osteoarthritis 10/28/2012   Migraine    Asthma     Past Surgical History:  Procedure Laterality Date   ABDOMINAL EXPLORATION SURGERY  approx 38 years old   rlq(due to adhesions around ovaries)-appendix and ovaries remain   ANTERIOR CERVICAL DECOMP/DISCECTOMY FUSION N/A 12/09/2012   Procedure: ANTERIOR CERVICAL DECOMPRESSION/DISCECTOMY FUSION STRUCTURAL ALLOGRAFT TRESTLE PLATE CERVICAL FIVE-SIX,SIX-SEVEN;  Surgeon: Lynwood JONELLE Mill, MD;  Location: [facility];  Service: Neurosurgery;  Laterality: N/A;   BACK SURGERY     CHOLECYSTECTOMY  05/10/2011   Procedure: LAPAROSCOPIC CHOLECYSTECTOMY WITH INTRAOPERATIVE CHOLANGIOGRAM;  Surgeon: Camellia CHRISTELLA Blush, MD,FACS;  Location: [facility];  Service: General;  Laterality: N/A;   LUMBAR LAMINECTOMY/DECOMPRESSION MICRODISCECTOMY Left 11/30/2014   Procedure: Left  lumbar four-five Microdiskectomy;  Surgeon: Victory Gens, MD;  Location: [facility];  Service: Neurosurgery;  Laterality: Left;   LUMBAR LAMINECTOMY/DECOMPRESSION MICRODISCECTOMY Left 08/15/2015   Procedure: Left Lumbar four-five Microdiskectomy;  Surgeon: Victory Gens, MD;  Location: [facility];  Service: Neurosurgery;  Laterality: Left;   LUMBAR LAMINECTOMY/DECOMPRESSION MICRODISCECTOMY Left 09/28/2016   Procedure: LEFT LUMBAR FIVE -SACRAL ONE Microdiscectomy;  Surgeon: Gens Victory, MD;  Location: [facility];  Service: Neurosurgery;  Laterality: Left;   TUBAL LIGATION Bilateral 11/16/2017   Procedure: POST PARTUM TUBAL  LIGATION;  Surgeon: Izell Harari, MD;  Location: [facility];  Service: Gynecology;  Laterality: Bilateral;    OB History     Gravida  6   Para  4   Term  3   Preterm  1   AB  2   Living  4      SAB  2   IAB      Ectopic      Multiple  0   Live Births  4            Home Medications    Prior to Admission medications   Medication Sig Start Date End Date Taking? Authorizing Provider  doxycycline  (VIBRAMYCIN ) 100 MG capsule Take 1 capsule (100 mg total) by mouth 2 (two) times daily. 10/07/23  Yes Malike Foglio  N, FNP  albuterol  (VENTOLIN  HFA) 108 (90 Base) MCG/ACT inhaler Inhale 1-2 puffs into the lungs every 4 (four) hours as needed for wheezing or shortness of breath. 03/12/23   Van Knee, MD  Baclofen  5 MG TABS Take 2 tablets (10 mg total) by mouth 2 (two) times daily as needed. 04/02/23   Rising, Asberry, PA-C  clonazePAM (KLONOPIN) 1 MG tablet Take 1 mg by mouth 3 (three) times daily as needed. RAN OUT    [provider]  fluticasone  (FLONASE ) 50 MCG/ACT nasal spray Place 2 sprays into both nostrils daily. 03/12/23   Van Knee, MD  ondansetron  (ZOFRAN -ODT) 4 MG disintegrating tablet Take 1 tablet (4 mg total) by mouth every 8 (eight) hours as needed for nausea or vomiting. 05/03/23   Rodriguez-Southworth, Kyra, PA-C  predniSONE  (STERAPRED UNI-PAK 21 TAB) 10 MG (21) TBPK tablet Take by mouth daily. Take 6 tabs by mouth daily  for 2 days, then 5 tabs for 2 days, then 4 tabs for 2 days, then 3 tabs for 2 days, 2 tabs for 2 days, then 1 tab by mouth daily for 2 days 08/27/23   Hinnant, Collin F, PA-C  rosuvastatin (CRESTOR) 5 MG tablet Take 5 mg by mouth daily.    [provider]  sertraline (ZOLOFT) 100 MG tablet Take 0.5 tablets by mouth daily.    [provider]  Spacer/Aero-Holding Chambers (AEROCHAMBER MV) inhaler Use as instructed 03/12/23   Van Knee, MD  triamcinolone  cream (KENALOG ) 0.1 % Apply 1  Application topically 2 (two) times daily. Apply for 2 weeks. May use on face 03/12/23   Van Knee, MD    Family History Family History  Problem Relation Age of Onset   Diabetes Mother    Endometriosis Mother    Cancer Mother        lung   Diabetes Father    Heart disease Father    Prostate cancer Maternal Grandfather    Cancer Maternal Grandfather        prostate   Colon polyps Maternal Grandmother    Cirrhosis Paternal Grandfather     Social History Social History  Tobacco Use   Smoking status: Every Day    Current packs/day: 1.00    Average packs/day: 1 pack/day for 12.0 years (12.0 ttl pk-yrs)    Types: Cigarettes   Smokeless tobacco: Never   Tobacco comments:    trying to quit  Vaping Use   Vaping status: Never Used  Substance Use Topics   Alcohol use: Not Currently    Comment: occasionally   Drug use: Not Currently    Types: Marijuana, Cocaine    Comment: cocaine she states is rec, she states she stopped marijuana 4 months ago     Allergies   Banana, Latex, Bioflavonoids, Hydrocodone , Orange fruit [citrus], and Miconazole   Review of Systems Review of Systems  Per HPI  Physical Exam Triage Vital Signs ED Triage Vitals [10/07/23 1859]  Encounter Vitals Group     BP (!) 158/110     Girls Systolic BP Percentile      Girls Diastolic BP Percentile      Boys Systolic BP Percentile      Boys Diastolic BP Percentile      Pulse Rate 87     Resp 17     Temp 98.3 F (36.8 C)     Temp Source Oral     SpO2 95 %     Weight      Height      Head Circumference      Peak Flow      Pain Score 9     Pain Loc      Pain Education      Exclude from Growth Chart    No data found.  Updated Vital Signs BP (!) 158/110 (BP Location: Right Arm)   Pulse 87   Temp 98.3 F (36.8 C) (Oral)   Resp 17   LMP 10/03/2023 (Exact Date)   SpO2 95%   Visual Acuity Right Eye Distance:   Left Eye Distance:   Bilateral Distance:    Right Eye Near:   Left  Eye Near:    Bilateral Near:     Physical Exam Vitals and nursing note reviewed.  Constitutional:      Appearance: Normal appearance.  HENT:     Head: Normocephalic and atraumatic.     Right Ear: External ear normal.     Left Ear: External ear normal.     Nose: Congestion present.     Mouth/Throat:     Mouth: Mucous membranes are moist.  Eyes:     Conjunctiva/sclera: Conjunctivae normal.  Cardiovascular:     Rate and Rhythm: Normal rate.  Pulmonary:     Effort: Pulmonary effort is normal. No respiratory distress.  Neurological:     General: No focal deficit present.     Mental Status: She is alert and oriented to person, place, and time.  Psychiatric:        Mood and Affect: Mood normal.        Behavior: Behavior normal. Behavior is cooperative.      UC Treatments / Results  Labs (all labs ordered are listed, but only abnormal results are displayed) Labs Reviewed - No data to display  EKG   Radiology No results found.  Procedures Procedures (including critical care time)  Medications Ordered in UC Medications  ketorolac  (TORADOL ) 30 MG/ML injection 30 mg (has no administration in time range)    Initial Impression / Assessment and Plan / UC Course  I have reviewed the triage vital signs and the nursing notes.  Pertinent  labs & imaging results that were available during my care of the patient were reviewed by me and considered in my medical decision making (see chart for details).  Vitals and triage reviewed, patient is hemodynamically stable.  Severe sinus pain and diffuse tenderness to palpation.  Septum with irritation and dried blood, some erosion, most likely from cocaine use.  Will treat for bacterial sinusitis with doxycycline , as patient did have Augmentin  a few months ago.  Strict emergency room precautions given if pain persists or symptoms evolve for further evaluation.  IM Toradol  given in clinic for pain.  Plan of care, follow-up care return  precautions given, no questions at this time.     Final Clinical Impressions(s) / UC Diagnoses   Final diagnoses:  Acute recurrent maxillary sinusitis     Discharge Instructions      You are being treated for a sinus infection with doxycycline .  Take this twice daily with food for the next 7 days.  This may cause you to be more prone to sunburns wear additional SPF.  We have given you Toradol  in clinic to help with pain, which should kick in in the next 30 minutes.  For any breakthrough pain you can take 500 mg of Tylenol .  If your pain does not improve over the next few days please seek immediate care at the nearest emergency department for further advanced evaluation.    ED Prescriptions     Medication Sig Dispense Auth. Provider   doxycycline  (VIBRAMYCIN ) 100 MG capsule Take 1 capsule (100 mg total) by mouth 2 (two) times daily. 20 capsule Dreama, Kadra Kohan  N, FNP      PDMP not reviewed this encounter.   Dreama Donnette SAILOR, FNP 10/07/23 1927

## 2023-10-07 NOTE — Discharge Instructions (Signed)
 You are being treated for a sinus infection with doxycycline .  Take this twice daily with food for the next 7 days.  This may cause you to be more prone to sunburns wear additional SPF.  We have given you Toradol  in clinic to help with pain, which should kick in in the next 30 minutes.  For any breakthrough pain you can take 500 mg of Tylenol .  If your pain does not improve over the next few days please seek immediate care at the nearest emergency department for further advanced evaluation.

## 2023-10-27 ENCOUNTER — Ambulatory Visit (HOSPITAL_COMMUNITY): Payer: Self-pay

## 2023-10-31 ENCOUNTER — Ambulatory Visit (HOSPITAL_COMMUNITY): Admission: EM | Admit: 2023-10-31 | Discharge: 2023-10-31 | Disposition: A | Discharge status: Home / Self Care

## 2023-10-31 ENCOUNTER — Encounter (HOSPITAL_COMMUNITY): Payer: Self-pay

## 2023-10-31 DIAGNOSIS — G43911 Migraine, unspecified, intractable, with status migrainosus: Secondary | ICD-10-CM | POA: Diagnosis not present

## 2023-10-31 MED ORDER — ONDANSETRON 4 MG PO TBDP
4.0000 mg | ORAL_TABLET | Freq: Once | ORAL | Status: AC
  Administered 2023-10-31: 4 mg via ORAL

## 2023-10-31 MED ORDER — FAMOTIDINE 20 MG PO TABS
20.0000 mg | ORAL_TABLET | Freq: Once | ORAL | Status: AC
  Administered 2023-10-31: 20 mg via ORAL

## 2023-10-31 MED ORDER — KETOROLAC TROMETHAMINE 30 MG/ML IJ SOLN
INTRAMUSCULAR | Status: AC
  Filled 2023-10-31: qty 1

## 2023-10-31 MED ORDER — PREDNISONE 20 MG PO TABS
60.0000 mg | ORAL_TABLET | Freq: Once | ORAL | Status: AC
  Administered 2023-10-31: 60 mg via ORAL

## 2023-10-31 MED ORDER — KETOROLAC TROMETHAMINE 30 MG/ML IJ SOLN
30.0000 mg | Freq: Once | INTRAMUSCULAR | Status: AC
  Administered 2023-10-31: 30 mg via INTRAMUSCULAR

## 2023-10-31 MED ORDER — FAMOTIDINE 20 MG PO TABS
ORAL_TABLET | ORAL | Status: AC
  Filled 2023-10-31: qty 1

## 2023-10-31 MED ORDER — ONDANSETRON 4 MG PO TBDP
ORAL_TABLET | ORAL | Status: AC
  Filled 2023-10-31: qty 1

## 2023-10-31 MED ORDER — PREDNISONE 20 MG PO TABS
ORAL_TABLET | ORAL | Status: AC
  Filled 2023-10-31: qty 3

## 2023-10-31 MED ORDER — ONDANSETRON 4 MG PO TBDP: 4.0000 mg | ORAL_TABLET | Freq: Three times a day (TID) | ORAL | 0 refills | Status: DC | PRN

## 2023-10-31 MED ORDER — DICLOFENAC SODIUM 50 MG PO TBEC: 50.0000 mg | DELAYED_RELEASE_TABLET | Freq: Two times a day (BID) | ORAL | 1 refills | Status: DC

## 2023-10-31 NOTE — Discharge Instructions (Addendum)
  1. Intractable migraine with status migrainosus, unspecified migraine type (Primary) - ketorolac  (TORADOL ) 30 MG/ML injection 30 mg - predniSONE  (DELTASONE ) tablet 60 mg - famotidine  (PEPCID ) tablet 20 mg - ondansetron  (ZOFRAN -ODT) disintegrating tablet 4 mg - ondansetron  (ZOFRAN -ODT) 4 MG disintegrating tablet; Take 1 tablet (4 mg total) by mouth every 8 (eight) hours as needed for nausea or vomiting.  Dispense: 20 tablet; Refill: 0 - diclofenac  (VOLTAREN ) 50 MG EC tablet; Take 1 tablet (50 mg total) by mouth 2 (two) times daily.  Dispense: 30 tablet; Refill: 1 - Ambulatory referral to Neurology for follow-up evaluation and management of ongoing intractable migraine headaches. -Continue to monitor symptoms for any change in severity if there is any escalation of current symptoms or development of new symptoms follow-up in ER for further evaluation and management.

## 2023-10-31 NOTE — ED Triage Notes (Signed)
 Patient presenting with left side migraine onset 3 days ago and persistent. States the migraine has moved to the right side of the head.  Having slight nausea, light sensitivity. Pain going into the ear as well.  Prescriptions or OTC medications tried: Yes- benadryl , RX migraine medications   with no relief

## 2023-10-31 NOTE — ED Provider Notes (Signed)
 [facility]  Note:  This document was prepared using Dragon voice recognition software and may include unintentional dictation errors.  MRN: [id number] DOB: 06-11-1985  Subjective:   [name] is a 38 y.o. female presenting for left-sided migraine headache x 3 days.  Patient reports that migraines are persistent and have now moved to the right side of her head.  Patient has mild nausea, light sensitivity.  Patient reports taking Benadryl  and over-the-counter migraine medication with minimal improvement.  Patient reports chronic history of migraine headaches but no evaluation by neurology for controlled medication.  Denies any vision changes, weakness, dizziness, chest pain, shortness of breath.  No current facility-administered medications for this encounter.  Current Outpatient Medications:    albuterol  (VENTOLIN  HFA) 108 (90 Base) MCG/ACT inhaler, Inhale 1-2 puffs into the lungs every 4 (four) hours as needed for wheezing or shortness of breath., Disp: 1 each, Rfl: 0   Baclofen  5 MG TABS, Take 2 tablets (10 mg total) by mouth 2 (two) times daily as needed., Disp: 40 tablet, Rfl: 0   clonazePAM (KLONOPIN) 1 MG tablet, Take 1 mg by mouth 3 (three) times daily as needed. RAN OUT, Disp: , Rfl:    diclofenac  (VOLTAREN ) 50 MG EC tablet, Take 1 tablet (50 mg total) by mouth 2 (two) times daily., Disp: 30 tablet, Rfl: 1   ondansetron  (ZOFRAN -ODT) 4 MG disintegrating tablet, Take 1 tablet (4 mg total) by mouth every 8 (eight) hours as needed for nausea or vomiting., Disp: 20 tablet, Rfl: 0   rosuvastatin (CRESTOR) 5 MG tablet, Take 5 mg by mouth daily., Disp: , Rfl:    sertraline (ZOLOFT) 100 MG tablet, Take 0.5 tablets by mouth daily., Disp: , Rfl:    Spacer/Aero-Holding Chambers (AEROCHAMBER MV) inhaler, Use as instructed, Disp: 1 each, Rfl: 1   triamcinolone  cream (KENALOG ) 0.1 %, Apply 1 Application topically 2 (two) times daily. Apply for 2 weeks. May use on face, Disp:  30 g, Rfl: 0   fluticasone  (FLONASE ) 50 MCG/ACT nasal spray, Place 2 sprays into both nostrils daily., Disp: 16 g, Rfl: 0   Allergies  Allergen Reactions   Banana Itching      Itchy throat   Latex Itching and Rash    Red spots   Bioflavonoids Other (See Comments)   Citrus Other (See Comments)    Mouth pain, acid reflux   Hydrocodone  Hives   Miconazole Other (See Comments) and Rash    Paradoxical effect: increases yeast (also)    Past Medical History:  Diagnosis Date   Abortion in first trimester 08/2016   Anemia    Anxiety    Panic attack   Arthritis    Asthma    Bipolar 1 disorder (HCC)    Cancer (HCC)    Cervical cancer (HCC)    Chronic kidney disease    history of kidney shut down  no current problems   Constipation    DDD (degenerative disc disease), cervical    Edema    Gallstones    GERD (gastroesophageal reflux disease)    High cholesterol    History of anemia    History of renal failure    HPV (human papilloma virus) anogenital infection    Leukocytosis    going to hematologist   Migraine    Obesity    Obesity    Pelvic inflammatory disease (PID) 05-08-11   previous hx. .Hx. childbirth x3-NVD   PID (pelvic inflammatory disease)    PTSD (post-traumatic stress disorder)  Sciatica    Scoliosis      Past Surgical History:  Procedure Laterality Date   ABDOMINAL EXPLORATION SURGERY  approx 38 years old   rlq(due to adhesions around ovaries)-appendix and ovaries remain   ANTERIOR CERVICAL DECOMP/DISCECTOMY FUSION N/A 12/09/2012   Procedure: ANTERIOR CERVICAL DECOMPRESSION/DISCECTOMY FUSION STRUCTURAL ALLOGRAFT TRESTLE PLATE CERVICAL FIVE-SIX,SIX-SEVEN;  Surgeon: Lynwood JONELLE Mill, MD;  Location: [facility];  Service: Neurosurgery;  Laterality: N/A;   BACK SURGERY     CHOLECYSTECTOMY  05/10/2011   Procedure: LAPAROSCOPIC CHOLECYSTECTOMY WITH INTRAOPERATIVE CHOLANGIOGRAM;  Surgeon: Camellia CHRISTELLA Blush, MD,FACS;  Location: [facility];  Service: General;  Laterality: N/A;    LUMBAR LAMINECTOMY/DECOMPRESSION MICRODISCECTOMY Left 11/30/2014   Procedure: Left lumbar four-five Microdiskectomy;  Surgeon: Victory Gens, MD;  Location: [facility];  Service: Neurosurgery;  Laterality: Left;   LUMBAR LAMINECTOMY/DECOMPRESSION MICRODISCECTOMY Left 08/15/2015   Procedure: Left Lumbar four-five Microdiskectomy;  Surgeon: Victory Gens, MD;  [facility];  Service: Neurosurgery;  Laterality: Left;   LUMBAR LAMINECTOMY/DECOMPRESSION MICRODISCECTOMY Left 09/28/2016   Procedure: LEFT LUMBAR FIVE -SACRAL ONE Microdiscectomy;  Surgeon: Gens Victory, MD;  Location: [facility];  Service: Neurosurgery;  Laterality: Left;   TUBAL LIGATION Bilateral 11/16/2017   Procedure: POST PARTUM TUBAL LIGATION;  Surgeon: Izell Harari, MD;  Location: [facility];  Service: Gynecology;  Laterality: Bilateral;    Family History  Problem Relation Age of Onset   Diabetes Mother    Endometriosis Mother    Cancer Mother        lung   Diabetes Father    Heart disease Father    Prostate cancer Maternal Grandfather    Cancer Maternal Grandfather        prostate   Colon polyps Maternal Grandmother    Cirrhosis Paternal Grandfather     Social History   Tobacco Use   Smoking status: Every Day    Current packs/day: 1.00    Average packs/day: 1 pack/day for 12.0 years (12.0 ttl pk-yrs)    Types: Cigarettes   Smokeless tobacco: Never   Tobacco comments:    trying to quit  Vaping Use   Vaping status: Never Used  Substance Use Topics   Alcohol use: Not Currently    Comment: occasionally   Drug use: Not Currently    Types: Marijuana, Cocaine    Comment: cocaine she states is rec, she states she stopped marijuana 4 months ago    ROS Refer to HPI for ROS details.  Objective:   Vitals: BP 115/81 (BP Location: Right Arm)   Pulse 97   Temp 98.2 F (36.8 C) (Oral)   Resp 18   Ht 5' 2 (1.575 m)   LMP 10/03/2023 (Exact Date)   SpO2 97%   BMI 43.15 kg/m   Physical  Exam Vitals and nursing note reviewed.  Constitutional:      General: She is not in acute distress.    Appearance: Normal appearance. She is well-developed. She is not ill-appearing or toxic-appearing.  HENT:     Head: Normocephalic and atraumatic.     Nose: Congestion present. No rhinorrhea.     Mouth/Throat:     Mouth: Mucous membranes are moist.  Eyes:     General:        Right eye: No discharge.        Left eye: No discharge.     Extraocular Movements: Extraocular movements intact.     Conjunctiva/sclera: Conjunctivae normal.     Pupils: Pupils are equal, round,  and reactive to light.  Cardiovascular:     Rate and Rhythm: Normal rate.  Pulmonary:     Effort: Pulmonary effort is normal. No respiratory distress.  Skin:    General: Skin is warm and dry.  Neurological:     General: No focal deficit present.     Mental Status: She is alert and oriented to person, place, and time.  Psychiatric:        Mood and Affect: Mood normal.        Behavior: Behavior normal.     Procedures  No results found for this or any previous visit (from the past 24 hours).  No results found.   Assessment and Plan :     Discharge Instructions       1. Intractable migraine with status migrainosus, unspecified migraine type (Primary) - ketorolac  (TORADOL ) 30 MG/ML injection 30 mg - predniSONE  (DELTASONE ) tablet 60 mg - famotidine  (PEPCID ) tablet 20 mg - ondansetron  (ZOFRAN -ODT) disintegrating tablet 4 mg - ondansetron  (ZOFRAN -ODT) 4 MG disintegrating tablet; Take 1 tablet (4 mg total) by mouth every 8 (eight) hours as needed for nausea or vomiting.  Dispense: 20 tablet; Refill: 0 - diclofenac  (VOLTAREN ) 50 MG EC tablet; Take 1 tablet (50 mg total) by mouth 2 (two) times daily.  Dispense: 30 tablet; Refill: 1 - Ambulatory referral to Neurology for follow-up evaluation and management of ongoing intractable migraine headaches. -Continue to monitor symptoms for any change in severity if there  is any escalation of current symptoms or development of new symptoms follow-up in ER for further evaluation and management.      Dexter Signor B Luian Schumpert   Yancey Pedley, [place], [place] 10/31/23 2015

## 2023-11-01 ENCOUNTER — Ambulatory Visit (HOSPITAL_COMMUNITY): Admission: EM | Admit: 2023-11-01 | Discharge: 2023-11-01 | Disposition: A | Discharge status: Home / Self Care

## 2023-11-01 ENCOUNTER — Encounter (HOSPITAL_COMMUNITY): Payer: Self-pay | Admitting: *Deleted

## 2023-11-01 DIAGNOSIS — G43919 Migraine, unspecified, intractable, without status migrainosus: Secondary | ICD-10-CM | POA: Diagnosis not present

## 2023-11-01 NOTE — ED Triage Notes (Addendum)
 Pt was seen in [facility] yesterday for HA. States felt better after visit yesterday, and pt states she filled her Rxs this AM and took them, but c/o severe HA. States her child was crying for an hour with a HA today also. Pt very tearful. Has tried the Rxs, Benadryl , and hot compresses without relief. Pt states her father died on 2023/11/14.

## 2023-11-01 NOTE — ED Notes (Signed)
 Patient is being discharged from the Urgent Care and sent to the Emergency Department via private vehicle . Per E. White, PA, patient is in need of higher level of care due to severe HA. Patient is aware and verbalizes understanding of plan of care.  Vitals:   11/01/23 1731 11/01/23 1732  BP:  (!) 167/76  Pulse: 85   Resp: 18   Temp: 98.1 F (36.7 C)   SpO2: 96%

## 2023-11-01 NOTE — ED Provider Notes (Signed)
 [facility]    CSN: [id number] Arrival date & time: 11/01/23  1654      History   Chief Complaint No chief complaint on file.   HPI [name] is a 38 y.o. female.   38 year old female presents urgent care with complaints of a severe headache.  The patient has a history of migraines.  She was here yesterday secondary to migraine and received Toradol , Benadryl , Zofran , prednisone  with only short-term relief of symptoms.  Her symptoms recurred today and are much more severe.  She has taken the medication prescribed including Voltaren  and Zofran .  Her symptoms have not improved.  She complains that her headache is the worst she has had.  She reports that she felt a pop in her head and that it has been downhill from there.  She also relates that her 94-year-old has been sick for several days and she has been trying to take care of her which is making things worse.  She reports that her symptoms include significant eye symptoms including visual disturbances with photosensitivity, redness and eye drainage.     Past Medical History:  Diagnosis Date   Abortion in first trimester 08/2016   Anemia    Anxiety    Panic attack   Arthritis    Asthma    Bipolar 1 disorder (HCC)    Cancer (HCC)    Cervical cancer (HCC)    Chronic kidney disease    history of kidney shut down  no current problems   Constipation    DDD (degenerative disc disease), cervical    Edema    Gallstones    GERD (gastroesophageal reflux disease)    High cholesterol    History of anemia    History of renal failure    HPV (human papilloma virus) anogenital infection    Leukocytosis    going to hematologist   Migraine    Obesity    Obesity    Pelvic inflammatory disease (PID) 05-08-11   previous hx. .Hx. childbirth x3-NVD   PID (pelvic inflammatory disease)    PTSD (post-traumatic stress disorder)    Sciatica    Scoliosis     Patient Active Problem List   Diagnosis Date Noted   CAP  (community acquired pneumonia) 07/19/2021   Vaginal delivery 11/16/2017   Anemia in pregnancy 08/30/2017   Unwanted fertility 08/23/2017   Obesity affecting pregnancy, antepartum 06/19/2017   Supervision of high risk pregnancy, antepartum, second trimester 05/29/2017   History of preterm delivery, currently pregnant in second trimester 05/29/2017   Tobacco use in pregnancy, antepartum, second trimester 05/29/2017   Drug use affecting pregnancy in second trimester 05/29/2017   History of premature delivery 08/10/2016   Bipolar disorder (HCC) 08/07/2016   Herniated nucleus pulposus, L4-5 left 11/30/2014   Chronic pain syndrome 10/28/2012   Neck pain 10/28/2012   Back pain 10/28/2012   Osteoarthritis 10/28/2012   Migraine    Asthma     Past Surgical History:  Procedure Laterality Date   ABDOMINAL EXPLORATION SURGERY  approx 38 years old   rlq(due to adhesions around ovaries)-appendix and ovaries remain   ANTERIOR CERVICAL DECOMP/DISCECTOMY FUSION N/A 12/09/2012   Procedure: ANTERIOR CERVICAL DECOMPRESSION/DISCECTOMY FUSION STRUCTURAL ALLOGRAFT TRESTLE PLATE CERVICAL FIVE-SIX,SIX-SEVEN;  Surgeon: Lynwood JONELLE Mill, MD;  Location: [facility];  Service: Neurosurgery;  Laterality: N/A;   BACK SURGERY     CHOLECYSTECTOMY  05/10/2011   Procedure: LAPAROSCOPIC CHOLECYSTECTOMY WITH INTRAOPERATIVE CHOLANGIOGRAM;  Surgeon: Camellia CHRISTELLA Blush, MD,FACS;  Location:  [facility];  Service: General;  Laterality: N/A;   LUMBAR LAMINECTOMY/DECOMPRESSION MICRODISCECTOMY Left 11/30/2014   Procedure: Left lumbar four-five Microdiskectomy;  Surgeon: Victory Gens, MD;  Location: [facility];  Service: Neurosurgery;  Laterality: Left;   LUMBAR LAMINECTOMY/DECOMPRESSION MICRODISCECTOMY Left 08/15/2015   Procedure: Left Lumbar four-five Microdiskectomy;  Surgeon: Victory Gens, MD;  Location: [facility];  Service: Neurosurgery;  Laterality: Left;   LUMBAR LAMINECTOMY/DECOMPRESSION MICRODISCECTOMY Left 09/28/2016   Procedure:  LEFT LUMBAR FIVE -SACRAL ONE Microdiscectomy;  Surgeon: Gens Victory, MD;  Location: [facility];  Service: Neurosurgery;  Laterality: Left;   TUBAL LIGATION Bilateral 11/16/2017   Procedure: POST PARTUM TUBAL LIGATION;  Surgeon: Izell Harari, MD;  Location: [facility];  Service: Gynecology;  Laterality: Bilateral;    OB History     Gravida  6   Para  4   Term  3   Preterm  1   AB  2   Living  4      SAB  2   IAB      Ectopic      Multiple  0   Live Births  4            Home Medications    Prior to Admission medications   Medication Sig Start Date End Date Taking? Authorizing Provider  diclofenac  (VOLTAREN ) 50 MG EC tablet Take 1 tablet (50 mg total) by mouth 2 (two) times daily. 10/31/23  Yes Reddick, Johnathan B, NP  fluticasone  (FLONASE ) 50 MCG/ACT nasal spray Place 2 sprays into both nostrils daily. 03/12/23  Yes Van Knee, MD  ondansetron  (ZOFRAN -ODT) 4 MG disintegrating tablet Take 1 tablet (4 mg total) by mouth every 8 (eight) hours as needed for nausea or vomiting. 10/31/23  Yes Reddick, Johnathan B, NP  rosuvastatin (CRESTOR) 5 MG tablet Take 5 mg by mouth daily.   Yes [provider]  sertraline (ZOLOFT) 100 MG tablet Take 0.5 tablets by mouth daily.   Yes [provider]  albuterol  (VENTOLIN  HFA) 108 (90 Base) MCG/ACT inhaler Inhale 1-2 puffs into the lungs every 4 (four) hours as needed for wheezing or shortness of breath. 03/12/23   Van Knee, MD  Baclofen  5 MG TABS Take 2 tablets (10 mg total) by mouth 2 (two) times daily as needed. 04/02/23   Rising, Asberry, PA-C  clonazePAM (KLONOPIN) 1 MG tablet Take 1 mg by mouth 3 (three) times daily as needed. RAN OUT    [provider]  Spacer/Aero-Holding Chambers (AEROCHAMBER MV) inhaler Use as instructed 03/12/23   Van Knee, MD  triamcinolone  cream (KENALOG ) 0.1 % Apply 1 Application topically 2 (two) times daily. Apply for 2 weeks. May use on face 03/12/23    Van Knee, MD    Family History Family History  Problem Relation Age of Onset   Diabetes Mother    Endometriosis Mother    Cancer Mother        lung   Diabetes Father    Heart disease Father    Prostate cancer Maternal Grandfather    Cancer Maternal Grandfather        prostate   Colon polyps Maternal Grandmother    Cirrhosis Paternal Grandfather     Social History Social History   Tobacco Use   Smoking status: Every Day    Current packs/day: 1.00    Average packs/day: 1 pack/day for 12.0 years (12.0 ttl pk-yrs)    Types: Cigarettes   Smokeless tobacco: Never   Tobacco comments:  trying to quit  Vaping Use   Vaping status: Never Used  Substance Use Topics   Alcohol use: Not Currently    Comment: occasionally   Drug use: Not Currently    Types: Marijuana, Cocaine    Comment: cocaine she states is rec, she states she stopped marijuana 4 months ago     Allergies   Banana, Latex, Bioflavonoids, Citrus, Hydrocodone , and Miconazole   Review of Systems Review of Systems  Constitutional:  Negative for chills and fever.  HENT:  Negative for ear pain and sore throat.   Eyes:  Positive for photophobia, pain, discharge and visual disturbance.  Respiratory:  Negative for cough and shortness of breath.   Cardiovascular:  Negative for chest pain and palpitations.  Gastrointestinal:  Negative for abdominal pain and vomiting.  Genitourinary:  Negative for dysuria and hematuria.  Musculoskeletal:  Negative for arthralgias and back pain.  Skin:  Negative for color change and rash.  Neurological:  Positive for headaches. Negative for seizures and syncope.  All other systems reviewed and are negative.    Physical Exam Triage Vital Signs ED Triage Vitals  Encounter Vitals Group     BP 11/01/23 1732 (!) 167/76     Girls Systolic BP Percentile --      Girls Diastolic BP Percentile --      Boys Systolic BP Percentile --      Boys Diastolic BP Percentile --       Pulse Rate 11/01/23 1731 85     Resp 11/01/23 1731 18     Temp 11/01/23 1731 98.1 F (36.7 C)     Temp Source 11/01/23 1731 Oral     SpO2 11/01/23 1731 96 %     Weight --      Height --      Head Circumference --      Peak Flow --      Pain Score 11/01/23 1731 9     Pain Loc --      Pain Education --      Exclude from Growth Chart --    No data found.  Updated Vital Signs BP (!) 167/76 Comment: pt crying  Pulse 85   Temp 98.1 F (36.7 C) (Oral)   Resp 18   LMP 10/03/2023 (Exact Date)   SpO2 96%   Visual Acuity Right Eye Distance:   Left Eye Distance:   Bilateral Distance:    Right Eye Near:   Left Eye Near:    Bilateral Near:     Physical Exam Vitals and nursing note reviewed.  Constitutional:      General: She is in acute distress.     Appearance: She is well-developed.  HENT:     Head: Normocephalic and atraumatic.     Nose: Congestion present.     Mouth/Throat:     Mouth: Mucous membranes are moist.  Eyes:     Conjunctiva/sclera: Conjunctivae normal.  Cardiovascular:     Rate and Rhythm: Normal rate and regular rhythm.     Heart sounds: No murmur heard. Pulmonary:     Effort: Pulmonary effort is normal. No respiratory distress.     Breath sounds: Normal breath sounds.  Abdominal:     Palpations: Abdomen is soft.     Tenderness: There is no abdominal tenderness.  Musculoskeletal:        General: No swelling.     Cervical back: Neck supple.  Skin:    General: Skin is warm and dry.  Capillary Refill: Capillary refill takes less than 2 seconds.  Neurological:     General: No focal deficit present.     Mental Status: She is alert.     Comments: Crying, rocking back and forth, keeping the eyes covered, unable to get comfortable  Psychiatric:        Mood and Affect: Mood normal.      UC Treatments / Results  Labs (all labs ordered are listed, but only abnormal results are displayed) Labs Reviewed - No data to display  EKG   Radiology No  results found.  Procedures Procedures (including critical care time)  Medications Ordered in UC Medications - No data to display  Initial Impression / Assessment and Plan / UC Course  I have reviewed the triage vital signs and the nursing notes.  Pertinent labs & imaging results that were available during my care of the patient were reviewed by me and considered in my medical decision making (see chart for details).     Intractable migraine without status migrainosus, unspecified migraine type   Severe and worsening migraine despite treatment yesterday at urgent care as well as home medication.  Only had a very short term relief of symptoms before they recurred.  Seem to be more severe today.  Due to the level of severity and the stress that the patient is in, we recommend further evaluation in the emergency room where more advanced imaging as well as treatment can be obtained.  Final Clinical Impressions(s) / UC Diagnoses   Final diagnoses:  Intractable migraine without status migrainosus, unspecified migraine type     Discharge Instructions      Severe migraine that has failed to respond to treatment in urgent care as well as medication prescribed for home use.  Neurological exam is intact but at this point the pain is severe and nonresponsive and recommend further evaluation in the emergency room or more advanced imaging as well as treatment can be performed.     ED Prescriptions   None    PDMP not reviewed this encounter.   Teresa Almarie LABOR, [place] 11/01/23 1815

## 2023-11-01 NOTE — Discharge Instructions (Signed)
 Severe migraine that has failed to respond to treatment in urgent care as well as medication prescribed for home use.  Neurological exam is intact but at this point the pain is severe and nonresponsive and recommend further evaluation in the emergency room or more advanced imaging as well as treatment can be performed.

## 2023-11-26 ENCOUNTER — Ambulatory Visit (HOSPITAL_COMMUNITY): Admission: EM | Admit: 2023-11-26 | Discharge: 2023-11-26 | Disposition: A | Discharge status: Home / Self Care

## 2023-11-26 ENCOUNTER — Encounter (HOSPITAL_COMMUNITY): Payer: Self-pay

## 2023-11-26 DIAGNOSIS — K0889 Other specified disorders of teeth and supporting structures: Secondary | ICD-10-CM

## 2023-11-26 DIAGNOSIS — J029 Acute pharyngitis, unspecified: Secondary | ICD-10-CM

## 2023-11-26 DIAGNOSIS — H66002 Acute suppurative otitis media without spontaneous rupture of ear drum, left ear: Secondary | ICD-10-CM

## 2023-11-26 DIAGNOSIS — G43911 Migraine, unspecified, intractable, with status migrainosus: Secondary | ICD-10-CM

## 2023-11-26 LAB — POCT RAPID STREP A (OFFICE): Rapid Strep A Screen: NEGATIVE

## 2023-11-26 MED ORDER — AMOXICILLIN-POT CLAVULANATE 875-125 MG PO TABS: 1.0000 | ORAL_TABLET | Freq: Two times a day (BID) | ORAL | 0 refills | Status: AC

## 2023-11-26 MED ORDER — DICLOFENAC SODIUM 50 MG PO TBEC: 50.0000 mg | DELAYED_RELEASE_TABLET | Freq: Two times a day (BID) | ORAL | 2 refills | Status: AC

## 2023-11-26 NOTE — ED Provider Notes (Signed)
 [facility]  Note:  This document was prepared using Dragon voice recognition software and may include unintentional dictation errors.  MRN: [id number] DOB: 31-Mar-1985  Subjective:   [name] is a 38 y.o. female presenting for left ear pain, left-sided sore throat, left-sided dental pain, x 3 days.  Patient has been taking over-the-counter medication with minimal improvement.  Patient states that she did take previous prescription of diclofenac  that seemed to help symptoms but she ran out of medication and now pain has gotten worse.  Patient denies any known sick contacts.  Patient is unsure if sore throat, left ear pain, and dental pain are all related.  Patient also reports bilateral ankle swelling x 1 week that seems to be better after elevating her legs. no shortness of breath, chest pain, weakness, dizziness.  No current facility-administered medications for this encounter.  Current Outpatient Medications:    amoxicillin -clavulanate (AUGMENTIN ) 875-125 MG tablet, Take 1 tablet by mouth every 12 (twelve) hours for 10 days., Disp: 20 tablet, Rfl: 0   albuterol  (VENTOLIN  HFA) 108 (90 Base) MCG/ACT inhaler, Inhale 1-2 puffs into the lungs every 4 (four) hours as needed for wheezing or shortness of breath., Disp: 1 each, Rfl: 0   Baclofen  5 MG TABS, Take 2 tablets (10 mg total) by mouth 2 (two) times daily as needed., Disp: 40 tablet, Rfl: 0   clonazePAM (KLONOPIN) 1 MG tablet, Take 1 mg by mouth 3 (three) times daily as needed. RAN OUT, Disp: , Rfl:    diclofenac  (VOLTAREN ) 50 MG EC tablet, Take 1 tablet (50 mg total) by mouth 2 (two) times daily., Disp: 30 tablet, Rfl: 2   fluticasone  (FLONASE ) 50 MCG/ACT nasal spray, Place 2 sprays into both nostrils daily., Disp: 16 g, Rfl: 0   ondansetron  (ZOFRAN -ODT) 4 MG disintegrating tablet, Take 1 tablet (4 mg total) by mouth every 8 (eight) hours as needed for nausea or vomiting., Disp: 20 tablet, Rfl: 0   rosuvastatin  (CRESTOR) 5 MG tablet, Take 5 mg by mouth daily., Disp: , Rfl:    sertraline (ZOLOFT) 100 MG tablet, Take 0.5 tablets by mouth daily., Disp: , Rfl:    Spacer/Aero-Holding Chambers (AEROCHAMBER MV) inhaler, Use as instructed, Disp: 1 each, Rfl: 1   triamcinolone  cream (KENALOG ) 0.1 %, Apply 1 Application topically 2 (two) times daily. Apply for 2 weeks. May use on face, Disp: 30 g, Rfl: 0   Allergies  Allergen Reactions   Banana Itching      Itchy throat   Latex Itching and Rash    Red spots   Bioflavonoids Other (See Comments)   Citrus Other (See Comments)    Mouth pain, acid reflux   Hydrocodone  Hives   Miconazole Other (See Comments) and Rash    Paradoxical effect: increases yeast (also)    Past Medical History:  Diagnosis Date   Abortion in first trimester 08/2016   Anemia    Anxiety    Panic attack   Arthritis    Asthma    Bipolar 1 disorder (HCC)    Cancer (HCC)    Cervical cancer (HCC)    Chronic kidney disease    history of kidney shut down  no current problems   Constipation    DDD (degenerative disc disease), cervical    Edema    Gallstones    GERD (gastroesophageal reflux disease)    High cholesterol    History of anemia    History of renal failure    HPV (human  papilloma virus) anogenital infection    Leukocytosis    going to hematologist   Migraine    Obesity    Obesity    Pelvic inflammatory disease (PID) 05-08-11   previous hx. .Hx. childbirth x3-NVD   PID (pelvic inflammatory disease)    PTSD (post-traumatic stress disorder)    Sciatica    Scoliosis      Past Surgical History:  Procedure Laterality Date   ABDOMINAL EXPLORATION SURGERY  approx 38 years old   rlq(due to adhesions around ovaries)-appendix and ovaries remain   ANTERIOR CERVICAL DECOMP/DISCECTOMY FUSION N/A 12/09/2012   Procedure: ANTERIOR CERVICAL DECOMPRESSION/DISCECTOMY FUSION STRUCTURAL ALLOGRAFT TRESTLE PLATE CERVICAL FIVE-SIX,SIX-SEVEN;  Surgeon: Lynwood JONELLE Mill, MD;  Location:  [facility];  Service: Neurosurgery;  Laterality: N/A;   BACK SURGERY     CHOLECYSTECTOMY  05/10/2011   Procedure: LAPAROSCOPIC CHOLECYSTECTOMY WITH INTRAOPERATIVE CHOLANGIOGRAM;  Surgeon: Camellia CHRISTELLA Blush, MD,FACS;  Location: [facility];  Service: General;  Laterality: N/A;   LUMBAR LAMINECTOMY/DECOMPRESSION MICRODISCECTOMY Left 11/30/2014   Procedure: Left lumbar four-five Microdiskectomy;  Surgeon: Victory Gens, MD;  Location: [facility];  Service: Neurosurgery;  Laterality: Left;   LUMBAR LAMINECTOMY/DECOMPRESSION MICRODISCECTOMY Left 08/15/2015   Procedure: Left Lumbar four-five Microdiskectomy;  Surgeon: Victory Gens, MD;  [facility]: [facility];  Service: Neurosurgery;  Laterality: Left;   LUMBAR LAMINECTOMY/DECOMPRESSION MICRODISCECTOMY Left 09/28/2016   Procedure: LEFT LUMBAR FIVE -SACRAL ONE Microdiscectomy;  Surgeon: Gens Victory, MD;  Location: [facility];  Service: Neurosurgery;  Laterality: Left;   TUBAL LIGATION Bilateral 11/16/2017   Procedure: POST PARTUM TUBAL LIGATION;  Surgeon: Izell Harari, MD;  Location: [facility];  Service: Gynecology;  Laterality: Bilateral;    Family History  Problem Relation Age of Onset   Diabetes Mother    Endometriosis Mother    Cancer Mother        lung   Diabetes Father    Heart disease Father    Prostate cancer Maternal Grandfather    Cancer Maternal Grandfather        prostate   Colon polyps Maternal Grandmother    Cirrhosis Paternal Grandfather     Social History   Tobacco Use   Smoking status: Every Day    Current packs/day: 1.00    Average packs/day: 1 pack/day for 12.0 years (12.0 ttl pk-yrs)    Types: Cigarettes   Smokeless tobacco: Never   Tobacco comments:    trying to quit  Vaping Use   Vaping status: Never Used  Substance Use Topics   Alcohol use: Not Currently    Comment: occasionally   Drug use: Not Currently    Types: Marijuana, Cocaine    Comment: cocaine she states is rec, she states she stopped marijuana 4  months ago    ROS Refer to HPI for ROS details.  Objective:   Vitals: BP 135/89 (BP Location: Right Arm)   Pulse 71   Temp 98.2 F (36.8 C) (Oral)   Resp 18   LMP 10/31/2023 (Approximate)   SpO2 98%   Physical Exam Vitals and nursing note reviewed.  Constitutional:      General: She is not in acute distress.    Appearance: Normal appearance. She is well-developed. She is not ill-appearing or toxic-appearing.  HENT:     Head: Normocephalic and atraumatic.     Right Ear: Tympanic membrane, ear canal and external ear normal.     Left Ear: Ear canal and external ear normal. Swelling present. Tympanic membrane is injected and bulging.  Tympanic membrane is not erythematous.     Mouth/Throat:     Lips: Pink.     Mouth: Mucous membranes are moist.     Dentition: Abnormal dentition. Dental tenderness, gingival swelling and dental caries present. No dental abscesses.     Pharynx: Oropharyngeal exudate, posterior oropharyngeal erythema and postnasal drip present. No pharyngeal swelling.  Cardiovascular:     Rate and Rhythm: Normal rate.  Pulmonary:     Effort: Pulmonary effort is normal. No respiratory distress.  Musculoskeletal:        General: Normal range of motion.  Skin:    General: Skin is warm and dry.  Neurological:     General: No focal deficit present.     Mental Status: She is alert and oriented to person, place, and time.  Psychiatric:        Mood and Affect: Mood normal.        Behavior: Behavior normal.     Procedures  Results for orders placed or performed during the hospital encounter of 11/26/23 (from the past 24 hours)  POC rapid strep A     Status: None   Collection Time: 11/26/23  7:31 PM  Result Value Ref Range   Rapid Strep A Screen Negative Negative    No results found.   Assessment and Plan :     Discharge Instructions       1. Non-recurrent acute suppurative otitis media of left ear without spontaneous rupture of tympanic membrane  (Primary) 2. Acute pharyngitis, unspecified etiology - POC rapid strep A complete and UC is negative for strep pharyngitis  3. Pain, dental - diclofenac  (VOLTAREN ) 50 MG EC tablet; Take 1 tablet (50 mg total) by mouth 2 (two) times daily.  Dispense: 30 tablet; Refill: 2 - amoxicillin -clavulanate (AUGMENTIN ) 875-125 MG tablet; Take 1 tablet by mouth every 12 (twelve) hours for 10 days.  Dispense: 20 tablet; Refill: 0  -Continue to monitor symptoms for any change in severity if there is any escalation of current symptoms or development of new symptoms follow-up in ER for further evaluation and management.      Militza Devery B Saryn Cherry   [place] Schumm, [place] [facility], [place] 11/26/23 2035

## 2023-11-26 NOTE — ED Triage Notes (Signed)
 Pt c/o lt ear pain and lt side of throat burning with difficulty to swallow x3 days. States taken OTC meds with no relief.   Pt c/o bilateral ankle swelling x1wk but better now after elevating.

## 2023-11-26 NOTE — Discharge Instructions (Signed)
  1. Non-recurrent acute suppurative otitis media of left ear without spontaneous rupture of tympanic membrane (Primary) 2. Acute pharyngitis, unspecified etiology - POC rapid strep A complete and UC is negative for strep pharyngitis  3. Pain, dental - diclofenac  (VOLTAREN ) 50 MG EC tablet; Take 1 tablet (50 mg total) by mouth 2 (two) times daily.  Dispense: 30 tablet; Refill: 2 - amoxicillin -clavulanate (AUGMENTIN ) 875-125 MG tablet; Take 1 tablet by mouth every 12 (twelve) hours for 10 days.  Dispense: 20 tablet; Refill: 0  -Continue to monitor symptoms for any change in severity if there is any escalation of current symptoms or development of new symptoms follow-up in ER for further evaluation and management.

## 2023-11-28 ENCOUNTER — Emergency Department (HOSPITAL_COMMUNITY)
Admission: EM | Admit: 2023-11-28 | Discharge: 2023-11-28 | Disposition: A | Discharge status: Home / Self Care | Attending: Emergency Medicine | Admitting: Emergency Medicine

## 2023-11-28 ENCOUNTER — Other Ambulatory Visit: Payer: Self-pay

## 2023-11-28 ENCOUNTER — Encounter (HOSPITAL_COMMUNITY): Payer: Self-pay

## 2023-11-28 ENCOUNTER — Emergency Department (HOSPITAL_COMMUNITY): Admission: EM | Admit: 2023-11-28 | Source: Home / Self Care

## 2023-11-28 DIAGNOSIS — Z91040 Latex allergy status: Secondary | ICD-10-CM | POA: Insufficient documentation

## 2023-11-28 DIAGNOSIS — R519 Headache, unspecified: Secondary | ICD-10-CM | POA: Insufficient documentation

## 2023-11-28 DIAGNOSIS — H9201 Otalgia, right ear: Secondary | ICD-10-CM | POA: Diagnosis present

## 2023-11-28 DIAGNOSIS — H6691 Otitis media, unspecified, right ear: Secondary | ICD-10-CM | POA: Insufficient documentation

## 2023-11-28 DIAGNOSIS — H6690 Otitis media, unspecified, unspecified ear: Secondary | ICD-10-CM

## 2023-11-28 LAB — RESP PANEL BY RT-PCR (RSV, FLU A&B, COVID)  RVPGX2
Influenza A by PCR: NEGATIVE
Influenza B by PCR: NEGATIVE
Resp Syncytial Virus by PCR: NEGATIVE
SARS Coronavirus 2 by RT PCR: NEGATIVE

## 2023-11-28 MED ORDER — DEXAMETHASONE 1 MG/ML PO CONC: 10.0000 mg | Freq: Once | ORAL | Status: DC

## 2023-11-28 MED ORDER — OXYCODONE-ACETAMINOPHEN 5-325 MG PO TABS
1.0000 | ORAL_TABLET | Freq: Once | ORAL | Status: AC
  Administered 2023-11-28: 1 via ORAL
  Filled 2023-11-28: qty 1

## 2023-11-28 MED ORDER — LIDOCAINE VISCOUS HCL 2 % MT SOLN
15.0000 mL | Freq: Once | OROMUCOSAL | Status: AC
  Administered 2023-11-28: 15 mL via ORAL
  Filled 2023-11-28: qty 15

## 2023-11-28 MED ORDER — ALUM & MAG HYDROXIDE-SIMETH 200-200-20 MG/5ML PO SUSP
30.0000 mL | Freq: Once | ORAL | Status: AC
  Administered 2023-11-28: 30 mL via ORAL
  Filled 2023-11-28: qty 30

## 2023-11-28 MED ORDER — DEXAMETHASONE 10 MG/ML FOR PEDIATRIC ORAL USE
10.0000 mg | Freq: Once | INTRAMUSCULAR | Status: AC
  Administered 2023-11-28: 10 mg via ORAL
  Filled 2023-11-28 (×2): qty 1

## 2023-11-28 MED ORDER — ONDANSETRON 4 MG PO TBDP
4.0000 mg | ORAL_TABLET | Freq: Once | ORAL | Status: AC
  Administered 2023-11-28: 4 mg via ORAL
  Filled 2023-11-28: qty 1

## 2023-11-28 NOTE — ED Provider Notes (Signed)
 [place] EMERGENCY DEPARTMENT AT [facility] Provider Note   CSN: [id number] Arrival date & time: 11/28/23  0507     Patient presents with: Headache   [name] is a 38 y.o. female presents today for congestion x 4 days.  Patient was seen in urgent care 2 days ago and diagnosed with acute otitis media and started on Augmentin .  Patient reports she now has right ear pain, sore throat, and acid reflux that began 1 day ago.  Patient requesting COVID test.  Patient had negative strep on 9/2.    Headache Associated symptoms: congestion, ear pain and sore throat        Prior to Admission medications   Medication Sig Start Date End Date Taking? Authorizing Provider  albuterol  (VENTOLIN  HFA) 108 (90 Base) MCG/ACT inhaler Inhale 1-2 puffs into the lungs every 4 (four) hours as needed for wheezing or shortness of breath. 03/12/23   Van Knee, MD  amoxicillin -clavulanate (AUGMENTIN ) 875-125 MG tablet Take 1 tablet by mouth every 12 (twelve) hours for 10 days. 11/26/23 12/06/23  Reddick, Johnathan B, NP  Baclofen  5 MG TABS Take 2 tablets (10 mg total) by mouth 2 (two) times daily as needed. 04/02/23   Rising, Asberry, PA-C  clonazePAM (KLONOPIN) 1 MG tablet Take 1 mg by mouth 3 (three) times daily as needed. RAN OUT    [provider]  diclofenac  (VOLTAREN ) 50 MG EC tablet Take 1 tablet (50 mg total) by mouth 2 (two) times daily. 11/26/23   Reddick, Johnathan B, NP  fluticasone  (FLONASE ) 50 MCG/ACT nasal spray Place 2 sprays into both nostrils daily. 03/12/23   Van Knee, MD  ondansetron  (ZOFRAN -ODT) 4 MG disintegrating tablet Take 1 tablet (4 mg total) by mouth every 8 (eight) hours as needed for nausea or vomiting. 10/31/23   Reddick, Johnathan B, NP  rosuvastatin (CRESTOR) 5 MG tablet Take 5 mg by mouth daily.    [provider]  sertraline (ZOLOFT) 100 MG tablet Take 0.5 tablets by mouth daily.    [provider]  Spacer/Aero-Holding  Chambers (AEROCHAMBER MV) inhaler Use as instructed 03/12/23   Van Knee, MD  triamcinolone  cream (KENALOG ) 0.1 % Apply 1 Application topically 2 (two) times daily. Apply for 2 weeks. May use on face 03/12/23   Mortenson, Ashley, MD    Allergies: Banana, Latex, Bioflavonoids, Citrus, Hydrocodone , and Miconazole    Review of Systems  HENT:  Positive for congestion, ear pain and sore throat.     Updated Vital Signs BP (!) 126/94 (BP Location: Right Arm)   Pulse 94   Temp 98 F (36.7 C)   Resp 20   LMP 10/31/2023 (Approximate)   SpO2 100%   Physical Exam Vitals and nursing note reviewed.  Constitutional:      General: She is not in acute distress.    Appearance: She is well-developed. She is not ill-appearing.  HENT:     Head: Normocephalic and atraumatic.     Right Ear: Tympanic membrane is erythematous.     Left Ear: Tympanic membrane is erythematous.     Mouth/Throat:     Mouth: Mucous membranes are moist.     Pharynx: Oropharynx is clear.  Eyes:     Extraocular Movements: Extraocular movements intact.     Conjunctiva/sclera: Conjunctivae normal.     Pupils: Pupils are equal, round, and reactive to light.  Cardiovascular:     Rate and Rhythm: Normal rate and regular rhythm.     Heart sounds:  No murmur heard. Pulmonary:     Effort: Pulmonary effort is normal. No respiratory distress.     Breath sounds: Normal breath sounds.  Abdominal:     Palpations: Abdomen is soft.     Tenderness: There is no abdominal tenderness.  Musculoskeletal:        General: No swelling.     Cervical back: Neck supple.  Skin:    General: Skin is warm and dry.     Capillary Refill: Capillary refill takes less than 2 seconds.  Neurological:     Mental Status: She is alert.     GCS: GCS eye subscore is 4. GCS verbal subscore is 5. GCS motor subscore is 6.  Psychiatric:        Mood and Affect: Mood normal.     (all labs ordered are listed, but only abnormal results are  displayed) Labs Reviewed  RESP PANEL BY RT-PCR (RSV, FLU A&B, COVID)  RVPGX2    EKG: None  Radiology: No results found.   Procedures   Medications Ordered in the ED  alum & mag hydroxide-simeth (MAALOX/MYLANTA) 200-200-20 MG/5ML suspension 30 mL (has no administration in time range)    And  lidocaine  (XYLOCAINE ) 2 % viscous mouth solution 15 mL (has no administration in time range)  dexamethasone  (DECADRON ) 1 MG/ML solution 10 mg (has no administration in time range)  ondansetron  (ZOFRAN -ODT) disintegrating tablet 4 mg (4 mg Oral Given 11/28/23 0539)  oxyCODONE -acetaminophen  (PERCOCET/ROXICET) 5-325 MG per tablet 1 tablet (1 tablet Oral Given 11/28/23 0539)                                    Medical Decision Making  This patient presents to the ED for concern of URI symptoms differential diagnosis includes COVID, flu, RSV, viral URI, acute otitis media, strep pharyngitis  Additional history obtained Additional history obtained from Electronic Medical Record External records from outside source obtained and reviewed including Care Everywhere   Lab Tests:  I Ordered, and personally interpreted labs.  The pertinent results include: Respiratory panel negative  Problem List / ED Course:  Patient given Decadron  for sore throat and GI cocktail for acid reflux. Considered for admission or further workup however patient's vital signs, physical exam, and labs are reassuring.  Patient's symptoms likely due to acute otitis media.  Patient on appropriate treatment at this time and encouraged to complete outpatient antibiotic course.  Patient given return precautions.  I feel patient is safe for discharge at this time.      Final diagnoses:  Acute otitis media, unspecified otitis media type    ED Discharge Orders     None          Francis Ileana SAILOR, PA-C 11/28/23 9294    Bernard Drivers, MD 11/28/23 1008

## 2023-11-28 NOTE — ED Notes (Signed)
 Pt requesting to speak with PA about swelling in her feet. PA notified via secure chat.

## 2023-11-28 NOTE — ED Triage Notes (Signed)
 Pt states that she said that she has congestion x 4 days. Pt was seen at [facility] and diagnosed with sinus infection and left ear infection and prescribed her an antibiotic. Pt states that she now has pain in right ear and sore throat that began x 1 day ago. Pt requests another Covid test be performed

## 2023-11-28 NOTE — Discharge Instructions (Signed)
 Today you were seen for an ear infection.  Please continue taking your antibiotic prescribed to you by the urgent care.  Please return to the ED if you have fever that does not go down with Tylenol  or Motrin , uncontrollable vomiting, or worsening symptoms.  You may alternate Tylenol /Motrin  as needed for pain.  Thank you for letting us  treat you today. After reviewing your labs, I feel you are safe to go home. Please follow up with your PCP in the next several days and provide them with your records from this visit. Return to the Emergency Room if pain becomes severe or symptoms worsen.

## 2023-11-28 NOTE — ED Triage Notes (Signed)
 Patient here with child who also checks in as a patient. Patient wanting child to be seen over in peds first and then will check back in again later.

## 2023-12-01 ENCOUNTER — Emergency Department (HOSPITAL_COMMUNITY)
Admission: EM | Admit: 2023-12-01 | Discharge: 2023-12-01 | Disposition: A | Discharge status: Home / Self Care | Attending: Emergency Medicine | Admitting: Emergency Medicine

## 2023-12-01 ENCOUNTER — Emergency Department (HOSPITAL_COMMUNITY)

## 2023-12-01 ENCOUNTER — Encounter (HOSPITAL_COMMUNITY): Payer: Self-pay

## 2023-12-01 ENCOUNTER — Other Ambulatory Visit: Payer: Self-pay

## 2023-12-01 DIAGNOSIS — F319 Bipolar disorder, unspecified: Secondary | ICD-10-CM | POA: Diagnosis not present

## 2023-12-01 DIAGNOSIS — Y901 Blood alcohol level of 20-39 mg/100 ml: Secondary | ICD-10-CM | POA: Insufficient documentation

## 2023-12-01 DIAGNOSIS — E669 Obesity, unspecified: Secondary | ICD-10-CM | POA: Diagnosis not present

## 2023-12-01 DIAGNOSIS — J329 Chronic sinusitis, unspecified: Secondary | ICD-10-CM | POA: Diagnosis not present

## 2023-12-01 DIAGNOSIS — F4310 Post-traumatic stress disorder, unspecified: Secondary | ICD-10-CM | POA: Diagnosis not present

## 2023-12-01 DIAGNOSIS — Z91040 Latex allergy status: Secondary | ICD-10-CM | POA: Insufficient documentation

## 2023-12-01 DIAGNOSIS — H6690 Otitis media, unspecified, unspecified ear: Secondary | ICD-10-CM | POA: Diagnosis present

## 2023-12-01 DIAGNOSIS — F10920 Alcohol use, unspecified with intoxication, uncomplicated: Secondary | ICD-10-CM | POA: Diagnosis not present

## 2023-12-01 DIAGNOSIS — Z79899 Other long term (current) drug therapy: Secondary | ICD-10-CM | POA: Diagnosis not present

## 2023-12-01 DIAGNOSIS — J019 Acute sinusitis, unspecified: Secondary | ICD-10-CM | POA: Insufficient documentation

## 2023-12-01 DIAGNOSIS — Z6841 Body Mass Index (BMI) 40.0 and over, adult: Secondary | ICD-10-CM | POA: Diagnosis not present

## 2023-12-01 DIAGNOSIS — G43909 Migraine, unspecified, not intractable, without status migrainosus: Secondary | ICD-10-CM | POA: Diagnosis present

## 2023-12-01 DIAGNOSIS — D72829 Elevated white blood cell count, unspecified: Secondary | ICD-10-CM | POA: Insufficient documentation

## 2023-12-01 LAB — I-STAT CHEM 8, ED
BUN: 14 mg/dL (ref 6–20)
Calcium, Ion: 1.09 mmol/L — ABNORMAL LOW (ref 1.15–1.40)
Chloride: 103 mmol/L (ref 98–111)
Creatinine, Ser: 1.2 mg/dL — ABNORMAL HIGH (ref 0.44–1.00)
Glucose, Bld: 92 mg/dL (ref 70–99)
HCT: 35 % — ABNORMAL LOW (ref 36.0–46.0)
Hemoglobin: 11.9 g/dL — ABNORMAL LOW (ref 12.0–15.0)
Potassium: 4.1 mmol/L (ref 3.5–5.1)
Sodium: 137 mmol/L (ref 135–145)
TCO2: 19 mmol/L — ABNORMAL LOW (ref 22–32)

## 2023-12-01 LAB — CBC
HCT: 34.3 % — ABNORMAL LOW (ref 36.0–46.0)
Hemoglobin: 11.2 g/dL — ABNORMAL LOW (ref 12.0–15.0)
MCH: 24.2 pg — ABNORMAL LOW (ref 26.0–34.0)
MCHC: 32.7 g/dL (ref 30.0–36.0)
MCV: 74.1 fL — ABNORMAL LOW (ref 80.0–100.0)
Platelets: 429 K/uL — ABNORMAL HIGH (ref 150–400)
RBC: 4.63 MIL/uL (ref 3.87–5.11)
RDW: 18.5 % — ABNORMAL HIGH (ref 11.5–15.5)
WBC: 15.3 K/uL — ABNORMAL HIGH (ref 4.0–10.5)
nRBC: 0 % (ref 0.0–0.2)

## 2023-12-01 LAB — RESP PANEL BY RT-PCR (RSV, FLU A&B, COVID)  RVPGX2
Influenza A by PCR: NEGATIVE
Influenza B by PCR: NEGATIVE
Resp Syncytial Virus by PCR: NEGATIVE
SARS Coronavirus 2 by RT PCR: NEGATIVE

## 2023-12-01 LAB — BASIC METABOLIC PANEL WITH GFR
Anion gap: 16 — ABNORMAL HIGH (ref 5–15)
BUN: 15 mg/dL (ref 6–20)
CO2: 19 mmol/L — ABNORMAL LOW (ref 22–32)
Calcium: 9.2 mg/dL (ref 8.9–10.3)
Chloride: 100 mmol/L (ref 98–111)
Creatinine, Ser: 1.02 mg/dL — ABNORMAL HIGH (ref 0.44–1.00)
GFR, Estimated: >60 mL/min (ref >60)
Glucose, Bld: 88 mg/dL (ref 70–99)
Potassium: 4 mmol/L (ref 3.5–5.1)
Sodium: 135 mmol/L (ref 135–145)

## 2023-12-01 LAB — HCG, SERUM, QUALITATIVE: Preg, Serum: NEGATIVE

## 2023-12-01 LAB — ETHANOL: Alcohol, Ethyl (B): 30 mg/dL — ABNORMAL HIGH (ref <15)

## 2023-12-01 MED ORDER — IOHEXOL 350 MG/ML SOLN
75.0000 mL | Freq: Once | INTRAVENOUS | Status: AC | PRN
  Administered 2023-12-01: 75 mL via INTRAVENOUS

## 2023-12-01 MED ORDER — VALPROATE SODIUM 100 MG/ML IV SOLN
500.0000 mg | Freq: Once | INTRAVENOUS | Status: AC
  Administered 2023-12-01: 500 mg via INTRAVENOUS
  Filled 2023-12-01: qty 500

## 2023-12-01 MED ORDER — SODIUM CHLORIDE 0.9 % IV SOLN: INTRAVENOUS | Status: DC

## 2023-12-01 MED ORDER — DIPHENHYDRAMINE HCL 50 MG/ML IJ SOLN
12.5000 mg | Freq: Once | INTRAMUSCULAR | Status: AC
  Administered 2023-12-01: 12.5 mg via INTRAVENOUS
  Filled 2023-12-01: qty 1

## 2023-12-01 MED ORDER — SODIUM CHLORIDE 0.9 % IV BOLUS
1000.0000 mL | Freq: Once | INTRAVENOUS | Status: AC
  Administered 2023-12-01: 1000 mL via INTRAVENOUS

## 2023-12-01 MED ORDER — DEXAMETHASONE SODIUM PHOSPHATE 10 MG/ML IJ SOLN
10.0000 mg | Freq: Once | INTRAMUSCULAR | Status: AC
  Administered 2023-12-01: 10 mg via INTRAVENOUS
  Filled 2023-12-01: qty 1

## 2023-12-01 MED ORDER — METOCLOPRAMIDE HCL 5 MG/ML IJ SOLN
10.0000 mg | Freq: Once | INTRAMUSCULAR | Status: AC
  Administered 2023-12-01: 10 mg via INTRAVENOUS
  Filled 2023-12-01: qty 2

## 2023-12-01 MED ORDER — KETOROLAC TROMETHAMINE 15 MG/ML IJ SOLN
15.0000 mg | Freq: Once | INTRAMUSCULAR | Status: AC
  Administered 2023-12-01: 15 mg via INTRAVENOUS
  Filled 2023-12-01: qty 1

## 2023-12-01 NOTE — ED Notes (Signed)
 Paramedic Omar reviewed discharge instructions with patient. She verbalized understanding and denied any further questions. . Pt ambulated with stable gait to exit. Pt endorses ride home.

## 2023-12-01 NOTE — ED Provider Notes (Signed)
 [place] EMERGENCY DEPARTMENT AT [place] Provider Note   CSN: [id number] Arrival date & time: 12/01/23  9258     Patient presents with: Migraine   [name] is a 38 y.o. female.   HPI   38 year old female with medical history significant for obesity, bipolar disorder, anxiety, PTSD, chronic pain,  recent diagnosis of otitis media currently on Augmentin  who presents to the emergency department with a headache.  The patient states that she developed a gradual onset headache yesterday morning that has slowly progressed to associated light sensitivity, sound sensitivity, nausea.  Patient denies any thunderclap headache.  She denies any fevers.  On arrival, the patient appeared intoxicated, endorses taking 5 shots of liquor to treat her headache.  Of note, the patient was recently seen in the emergency department on 9/4 and is currently on Augmentin  for treatment of otitis media.  She arrives GCS 14, ABC intact.  Prior to Admission medications   Medication Sig Start Date End Date Taking? Authorizing Provider  albuterol  (VENTOLIN  HFA) 108 (90 Base) MCG/ACT inhaler Inhale 1-2 puffs into the lungs every 4 (four) hours as needed for wheezing or shortness of breath. 03/12/23   Van Knee, MD  amoxicillin -clavulanate (AUGMENTIN ) 875-125 MG tablet Take 1 tablet by mouth every 12 (twelve) hours for 10 days. 11/26/23 12/06/23  Reddick, Johnathan B, NP  Baclofen  5 MG TABS Take 2 tablets (10 mg total) by mouth 2 (two) times daily as needed. 04/02/23   Rising, Asberry, PA-C  clonazePAM (KLONOPIN) 1 MG tablet Take 1 mg by mouth 3 (three) times daily as needed. RAN OUT    [provider]  diclofenac  (VOLTAREN ) 50 MG EC tablet Take 1 tablet (50 mg total) by mouth 2 (two) times daily. 11/26/23   Reddick, Johnathan B, NP  fluticasone  (FLONASE ) 50 MCG/ACT nasal spray Place 2 sprays into both nostrils daily. 03/12/23   Van Knee, MD  ondansetron  (ZOFRAN -ODT) 4 MG  disintegrating tablet Take 1 tablet (4 mg total) by mouth every 8 (eight) hours as needed for nausea or vomiting. 10/31/23   Reddick, Johnathan B, NP  rosuvastatin (CRESTOR) 5 MG tablet Take 5 mg by mouth daily.    [provider]  sertraline (ZOLOFT) 100 MG tablet Take 0.5 tablets by mouth daily.    [provider]  Spacer/Aero-Holding Chambers (AEROCHAMBER MV) inhaler Use as instructed 03/12/23   Van Knee, MD  triamcinolone  cream (KENALOG ) 0.1 % Apply 1 Application topically 2 (two) times daily. Apply for 2 weeks. May use on face 03/12/23   Mortenson, Ashley, MD    Allergies: Banana, Latex, Bioflavonoids, Citrus, Hydrocodone , and Miconazole    Review of Systems  Unable to perform ROS: Acuity of condition    Updated Vital Signs BP 116/71   Pulse 84   Temp 98.9 F (37.2 C) (Oral)   Resp 18   Ht 5' 2 (1.575 m)   Wt 108.9 kg   LMP 10/31/2023 (Approximate)   SpO2 97%   BMI 43.90 kg/m   Physical Exam Vitals and nursing note reviewed.  Constitutional:      General: She is in acute distress.     Comments: Pt tearful, crying, rolling in bed hands on head  HENT:     Head: Normocephalic and atraumatic.     Right Ear: Tympanic membrane normal.     Left Ear: Tympanic membrane normal.     Ears:     Comments: No mastoid tenderness bilaterally    Mouth/Throat:  Comments: Very poor dentition Eyes:     Conjunctiva/sclera: Conjunctivae normal.     Pupils: Pupils are equal, round, and reactive to light.  Neck:     Comments: No nuchal rigidity, range of motion passively intact without pain Cardiovascular:     Rate and Rhythm: Normal rate and regular rhythm.  Pulmonary:     Effort: Pulmonary effort is normal. No respiratory distress.  Abdominal:     General: There is no distension.     Tenderness: There is no guarding.  Musculoskeletal:        General: No deformity or signs of injury.     Cervical back: Normal range of motion and neck supple. No rigidity.   Skin:    Findings: No lesion or rash.  Neurological:     General: No focal deficit present.     Mental Status: She is alert. Mental status is at baseline.     Comments: MENTAL STATUS EXAM:    Orientation: Alert and oriented to person, place and time.  Memory: Cooperative, follows commands well.  Language: Speech is clear and language is normal.   CRANIAL NERVES:    CN 2 (Optic): Visual fields intact to confrontation.  CN 3,4,6 (EOM): Pupils equal and reactive to light. Full extraocular eye movement without nystagmus.  CN 5 (Trigeminal): Facial sensation is normal, no weakness of masticatory muscles.  CN 7 (Facial): No facial weakness or asymmetry.  CN 8 (Auditory): Auditory acuity grossly normal.  CN 9,10 (Glossophar): The uvula is midline, the palate elevates symmetrically.  CN 11 (spinal access): Normal sternocleidomastoid and trapezius strength.  CN 12 (Hypoglossal): The tongue is midline. No atrophy or fasciculations.SABRA   MOTOR:  Muscle Strength: 5/5RUE, 5/5LUE, 5/5RLE, 5/5LLE.   COORDINATION:   No tremor.   SENSATION:   Intact to light touch all four extremities.  GAIT: Gait not assessed      (all labs ordered are listed, but only abnormal results are displayed) Labs Reviewed  CBC - Abnormal; Notable for the following components:      Result Value   WBC 15.3 (*)    Hemoglobin 11.2 (*)    HCT 34.3 (*)    MCV 74.1 (*)    MCH 24.2 (*)    RDW 18.5 (*)    Platelets 429 (*)    All other components within normal limits  BASIC METABOLIC PANEL WITH GFR - Abnormal; Notable for the following components:   CO2 19 (*)    Creatinine, Ser 1.02 (*)    Anion gap 16 (*)    All other components within normal limits  ETHANOL - Abnormal; Notable for the following components:   Alcohol, Ethyl (B) 30 (*)    All other components within normal limits  I-STAT CHEM 8, ED - Abnormal; Notable for the following components:   Creatinine, Ser 1.20 (*)    Calcium , Ion 1.09 (*)    TCO2 19  (*)    Hemoglobin 11.9 (*)    HCT 35.0 (*)    All other components within normal limits  RESP PANEL BY RT-PCR (RSV, FLU A&B, COVID)  RVPGX2  HCG, SERUM, QUALITATIVE    EKG: None  Radiology: CT Head W or Wo Contrast Result Date: 12/01/2023 EXAM: CT HEAD WITHOUT AND WITH CONTRAST 12/01/2023 10:28:31 AM TECHNIQUE: CT of the head was performed without and with the administration of intravenous contrast. Automated exposure control, iterative reconstruction, and/or weight based adjustment of the mA/kV was utilized to reduce the radiation dose to as  low as reasonably achievable. COMPARISON: CT of the head dated 08/27/2023. CLINICAL HISTORY: Headache, increasing frequency or severity. FINDINGS: BRAIN AND VENTRICLES: No acute intracranial hemorrhage. No mass effect or midline shift. No extra-axial fluid collection. No evidence of acute infarct. No hydrocephalus. No abnormal enhancement. ORBITS: No acute abnormality. SINUSES AND MASTOIDS: Mucosal disease present within the frontal, ethmoid, maxillary and sphenoid sinuses. Opacification of the right olfactory recess. SOFT TISSUES AND SKULL: No focal bone lesion. No acute soft tissue abnormality. Defect within the cartilaginous portion of the nasal septum. Leftward deviation of the bony segment of the nasal septum. IMPRESSION: 1. No acute intracranial abnormality. 2. Mucosal disease in the frontal, ethmoid, maxillary, and sphenoid sinuses with opacification of the right olfactory recess. 3. Defect within the cartilaginous portion of the nasal septum and leftward deviation of the bony segment. Electronically signed by: Evalene Coho MD 12/01/2023 11:04 AM EDT RP Workstation: [id number]     Procedures   Medications Ordered in the ED  sodium chloride  0.9 % bolus 1,000 mL (0 mLs Intravenous Stopped 12/01/23 0831)    And  0.9 %  sodium chloride  infusion (0 mLs Intravenous Stopped 12/01/23 1133)  metoCLOPramide  (REGLAN ) injection 10 mg (10 mg Intravenous Given  12/01/23 0808)  diphenhydrAMINE  (BENADRYL ) injection 12.5 mg (12.5 mg Intravenous Given 12/01/23 0808)  dexamethasone  (DECADRON ) injection 10 mg (10 mg Intravenous Given 12/01/23 0808)  ketorolac  (TORADOL ) 15 MG/ML injection 15 mg (15 mg Intravenous Given 12/01/23 0808)  valproate (DEPACON ) 500 mg in dextrose  5 % 50 mL IVPB (0 mg Intravenous Stopped 12/01/23 0937)  iohexol  (OMNIPAQUE ) 350 MG/ML injection 75 mL (75 mLs Intravenous Contrast Given 12/01/23 1018)                                    Medical Decision Making Amount and/or Complexity of Data Reviewed Labs: ordered. Radiology: ordered.  Risk Prescription drug management.    38 year old female with medical history significant for obesity, bipolar disorder, anxiety, PTSD, chronic pain,  recent diagnosis of otitis media currently on Augmentin  who presents to the emergency department with a headache.  The patient states that she developed a gradual onset headache yesterday morning that has slowly progressed to associated light sensitivity, sound sensitivity, nausea.  Patient denies any thunderclap headache.  She denies any fevers.  On arrival, the patient appeared intoxicated, endorses taking 5 shots of liquor to treat her headache.  Of note, the patient was recently seen in the emergency department on 9/4 and is currently on Augmentin  for treatment of otitis media.  She arrives GCS 14, ABC intact.  [name] is a 38 y.o. female who presents with headache as per above. I have reviewed the nursing documentation for past medical history, family history, and social history. I have reviewed the EMR and have learned that she is on ABX for OM, has a hx of poor dentition, has a hx of migraine headaches and states sx feel similar to prior migraines.  On arrival, the patient was vitally stable. Currently, she is awake, alert, GCS 15, HDS, and afebrile. Her exam is most notable for fully intact extraocular motions with bilaterally reactive pupils,  no focal neurologic deficits, no meningismus, and no temporal tenderness. There is no rash. The headache was not sudden onset or the worst headache of the patient's life. There is no visual deficit.  I am most concerned for migraine headache.  To further evaluate and risk  stratify her, labs were obtained, which were significant for:  Labs: Ethanol level 30, BMP without significant electrolyte abnormality, mild anion gap acidosis noted with a bicarbonate of 19, anion gap of 16, creatinine 1.02, CBC with a leukocytosis to 15.3, hemoglobin anemic but stable at 11.2, mild thrombocytosis to 429, hCG negative  Given headache, chronic sinusitis seen on previous CT imaging, and leukocytosis, will obtain CT of the head WWO.   Imaging:  CT Head WWO Contrast:  IMPRESSION:  1. No acute intracranial abnormality.  2. Mucosal disease in the frontal, ethmoid, maxillary, and sphenoid sinuses  with opacification of the right olfactory recess.  3. Defect within the cartilaginous portion of the nasal septum and leftward  deviation of the bony segment.     I do not think the patient has an aneurysm, intracranial bleed, mass lesion, meningitis, temporal arteritis, stroke, cluster headache, idiopathic intracranial hypertension, cavernous sinus thrombosis, carbon monoxide toxicity, herpes zoster, carotid or vertebral artery dissection, or acute angle close glaucoma.  Pt is already on Augmentin  for sinusitis. On recheck, she was feeling symptomatically improved. She has no meningismus, is well appearing.   On reassessment, the patient remained well appearing and was again able to ambulate without difficulty and did not have any focal neurologic deficits.  I believe the patient is stable for DC.  We participated in shared decision making regarding continued observation in the ED versus discharge home for continued recovery after receiving  medications. She preferred to recover at home. I believe that this is safe  and reasonable.  We have discussed the diagnosis and risks, and we agree with discharging home to follow-up with their primary doctor. We also discussed returning to the Emergency Department immediately if new or worsening symptoms occur. We have discussed the symptoms which are most concerning (e.g., changing or worsening pain, weakness, vomiting, fever, or abnormal sensation) that necessitate immediate return. I provided ED return precautions. The patient felt safe with this plan.  ED Medication Summary: Medications  sodium chloride  0.9 % bolus 1,000 mL (0 mLs Intravenous Stopped 12/01/23 0831)    And  0.9 %  sodium chloride  infusion (0 mLs Intravenous Stopped 12/01/23 1133)  metoCLOPramide  (REGLAN ) injection 10 mg (10 mg Intravenous Given 12/01/23 0808)  diphenhydrAMINE  (BENADRYL ) injection 12.5 mg (12.5 mg Intravenous Given 12/01/23 0808)  dexamethasone  (DECADRON ) injection 10 mg (10 mg Intravenous Given 12/01/23 0808)  ketorolac  (TORADOL ) 15 MG/ML injection 15 mg (15 mg Intravenous Given 12/01/23 0808)  valproate (DEPACON ) 500 mg in dextrose  5 % 50 mL IVPB (0 mg Intravenous Stopped 12/01/23 0937)  iohexol  (OMNIPAQUE ) 350 MG/ML injection 75 mL (75 mLs Intravenous Contrast Given 12/01/23 1018)   DC Instructions: Your headache symptoms resolved with a migraine cocktail in the emergency department.  Your CT imaging showed evidence of sinusitis for which you are currently on an antibiotic, Augmentin .  Continue this antibiotic as prescribed and follow-up with your primary care provider.      Final diagnoses:  Migraine without status migrainosus, not intractable, unspecified migraine type  Sinusitis, unspecified chronicity, unspecified location    ED Discharge Orders     None          Jerrol Agent, MD 12/01/23 1307

## 2023-12-01 NOTE — ED Triage Notes (Signed)
 Patient bib EMS from home with complaints of a headache. EMS reports it started at midnight and patient reports taking 5 shots of liquor to make the pain go away. On arrival patient is screaming. Alert and oriented x4.   PMHP: anxiety and bipolar

## 2023-12-01 NOTE — Discharge Instructions (Addendum)
 Your headache symptoms resolved with a migraine cocktail in the emergency department.  Your CT imaging showed evidence of sinusitis for which you are currently on an antibiotic, Augmentin .  Continue this antibiotic as prescribed and follow-up with your primary care provider.

## 2023-12-06 ENCOUNTER — Encounter: Payer: Self-pay | Admitting: Neurology

## 2023-12-06 ENCOUNTER — Ambulatory Visit (INDEPENDENT_AMBULATORY_CARE_PROVIDER_SITE_OTHER): Payer: Self-pay | Admitting: Neurology

## 2023-12-06 DIAGNOSIS — Z0289 Encounter for other administrative examinations: Secondary | ICD-10-CM

## 2023-12-06 NOTE — Progress Notes (Unsigned)
 [id number]    Provider:  Dr Ines Requesting Provider: Aurea Ethel NOVAK, NP Primary Care Provider:  Lenon Nell SAILOR, FNP  CC:  migraines  HPI:  [name] is a 38 y.o. female here as requested by Aurea Ethel NOVAK, NP for migraines. has Migraine; Asthma; Chronic pain syndrome; Neck pain; Back pain; Osteoarthritis; Herniated nucleus pulposus, L4-5 left; Supervision of high risk pregnancy, antepartum, second trimester; History of preterm delivery, currently pregnant in second trimester; Tobacco use in pregnancy, antepartum, second trimester; Drug use affecting pregnancy in second trimester; Obesity affecting pregnancy, antepartum; Unwanted fertility; Anemia in pregnancy; Bipolar disorder (HCC); History of premature delivery; Vaginal delivery; and CAP (community acquired pneumonia) on their problem list.  She used to have migraines a while back, emgality in the past helped a lot. Her insurance stopped paying for it. They went away with the emgality, but then had another baby and her stress level significantly increased, for a while she had mild headaches but this year worsening. Starts with scalp tenderness, pain on the left face, and in the forehead, Pulsating/pounding/throbbing, light and sound sensitivity, nausea, vomiting, hurts to move, migraines are moderate to severe and can last 8-24 hours or more, unilateral but can spread to be holocephalic. They are significantly affecting quality of life with work, family.  She stopped marijuana due to the migraines, a dark room helps. No aura, daily headaches and > 15 moderate to severe migraine days a month, she has been to the emergency room multiple times. These are very similar to past migraines and headaches, no new symptoms, not ecertional, not positional, just becoming more frequent with lack of treatment.   Reviewed notes, labs and imaging from outside physicians, which showed:  Tried and failed: From a  thorough review of records and patient report, Medications tried that can be used in migraine/headache management greater than 3 months include: Lifestyle modification, headache diaries, better sleep hygiene, exercise, management of migraine triggers, OTC and prescribed analgesics/nsaids such as ibuprofen , excedrin, alleve and others, baclofen , flexeril , stadol , diclofenac , benadryl , robaxin , reglan , zofran , zoloft, compazine , imitrex , phenergan , propranolol and other beta blockers contraindicated due to asthma, emgality helped tremendously, aimovig contraindicated due to constipation, topamax, amitriptyline and nortriptyline   12/01/2023: BRAIN AND VENTRICLES: No acute intracranial hemorrhage. No mass effect or midline shift. No extra-axial fluid collection. No evidence of acute infarct. No hydrocephalus. No abnormal enhancement.   ORBITS: No acute abnormality.   SINUSES AND MASTOIDS: Mucosal disease present within the frontal, ethmoid, maxillary and sphenoid sinuses. Opacification of the right olfactory recess.   SOFT TISSUES AND SKULL: No focal bone lesion. No acute soft tissue abnormality. Defect within the cartilaginous portion of the nasal septum. Leftward deviation of the bony segment of the nasal septum.   IMPRESSION: 1. No acute intracranial abnormality. 2. Mucosal disease in the frontal, ethmoid, maxillary, and sphenoid sinuses with opacification of the right olfactory recess. 3. Defect within the cartilaginous portion of the nasal septum and leftward deviation of the bony segment.  Review of Systems: Patient complains of symptoms per HPI as well as the following symptoms ***. Pertinent negatives and positives per HPI. All others negative.   Social History   Socioeconomic History   Marital status: Single    Spouse name: Not on file   Number of children: 3   Years of education: Not on file   Highest education level: Not on file  Occupational History   Occupation:  UNEMPLOYED    Employer: UNEMPLOYED  Tobacco Use  Smoking status: Every Day    Current packs/day: 1.00    Average packs/day: 1 pack/day for 12.0 years (12.0 ttl pk-yrs)    Types: Cigarettes   Smokeless tobacco: Never   Tobacco comments:    trying to quit  Vaping Use   Vaping status: Never Used  Substance and Sexual Activity   Alcohol use: Not Currently    Comment: occasionally   Drug use: Not Currently    Types: Marijuana, Cocaine    Comment: cocaine she states is rec, she states she stopped marijuana 4 months ago   Sexual activity: Yes    Birth control/protection: None  Other Topics Concern   Not on file  Social History Narrative   Not on file   Social Drivers of Health   Financial Resource Strain: Medium Risk (11/15/2017)   Overall Financial Resource Strain (CARDIA)    Difficulty of Paying Living Expenses: Somewhat hard  Food Insecurity: Food Insecurity Present (11/15/2017)   Hunger Vital Sign    Worried About Running Out of Food in the Last Year: Sometimes true    Ran Out of Food in the Last Year: Not on file  Transportation Needs: No Transportation Needs (11/15/2017)   PRAPARE - Administrator, Civil Service (Medical): No    Lack of Transportation (Non-Medical): No  Physical Activity: Inactive (11/15/2017)   Exercise Vital Sign    Days of Exercise per Week: 0 days    Minutes of Exercise per Session: 0 min  Stress: No Stress Concern Present (11/15/2017)   [employer] of Occupational Health - Occupational Stress Questionnaire    Feeling of Stress : Not at all  Social Connections: Not on file  Intimate Partner Violence: Not At Risk (11/15/2017)   Humiliation, Afraid, Rape, and Kick questionnaire    Fear of Current or Ex-Partner: No    Emotionally Abused: No    Physically Abused: No    Sexually Abused: No    Family History  Problem Relation Age of Onset   Diabetes Mother    Endometriosis Mother    Cancer Mother        lung   Diabetes Father     Heart disease Father    Prostate cancer Maternal Grandfather    Cancer Maternal Grandfather        prostate   Colon polyps Maternal Grandmother    Cirrhosis Paternal Grandfather     Past Medical History:  Diagnosis Date   Abortion in first trimester 08/2016   Anemia    Anxiety    Panic attack   Arthritis    Asthma    Bipolar 1 disorder (HCC)    Cancer (HCC)    Cervical cancer (HCC)    Chronic kidney disease    history of kidney shut down  no current problems   Constipation    DDD (degenerative disc disease), cervical    Edema    Gallstones    GERD (gastroesophageal reflux disease)    High cholesterol    History of anemia    History of renal failure    HPV (human papilloma virus) anogenital infection    Leukocytosis    going to hematologist   Migraine    Obesity    Obesity    Pelvic inflammatory disease (PID) 05-08-11   previous hx. .Hx. childbirth x3-NVD   PID (pelvic inflammatory disease)    PTSD (post-traumatic stress disorder)    Sciatica    Scoliosis     Patient Active Problem List  Diagnosis Date Noted   CAP (community acquired pneumonia) 07/19/2021   Vaginal delivery 11/16/2017   Anemia in pregnancy 08/30/2017   Unwanted fertility 08/23/2017   Obesity affecting pregnancy, antepartum 06/19/2017   Supervision of high risk pregnancy, antepartum, second trimester 05/29/2017   History of preterm delivery, currently pregnant in second trimester 05/29/2017   Tobacco use in pregnancy, antepartum, second trimester 05/29/2017   Drug use affecting pregnancy in second trimester 05/29/2017   History of premature delivery 08/10/2016   Bipolar disorder (HCC) 08/07/2016   Herniated nucleus pulposus, L4-5 left 11/30/2014   Chronic pain syndrome 10/28/2012   Neck pain 10/28/2012   Back pain 10/28/2012   Osteoarthritis 10/28/2012   Migraine    Asthma     Past Surgical History:  Procedure Laterality Date   ABDOMINAL EXPLORATION SURGERY  approx 38 years old    rlq(due to adhesions around ovaries)-appendix and ovaries remain   ANTERIOR CERVICAL DECOMP/DISCECTOMY FUSION N/A 12/09/2012   Procedure: ANTERIOR CERVICAL DECOMPRESSION/DISCECTOMY FUSION STRUCTURAL ALLOGRAFT TRESTLE PLATE CERVICAL FIVE-SIX,SIX-SEVEN;  Surgeon: Lynwood JONELLE Mill, MD;  Location: [facility];  Service: Neurosurgery;  Laterality: N/A;   BACK SURGERY     CHOLECYSTECTOMY  05/10/2011   Procedure: LAPAROSCOPIC CHOLECYSTECTOMY WITH INTRAOPERATIVE CHOLANGIOGRAM;  Surgeon: Camellia CHRISTELLA Blush, MD,FACS;  Location: [facility];  Service: General;  Laterality: N/A;   LUMBAR LAMINECTOMY/DECOMPRESSION MICRODISCECTOMY Left 11/30/2014   Procedure: Left lumbar four-five Microdiskectomy;  Surgeon: Victory Gens, MD;  Location: [facility];  Service: Neurosurgery;  Laterality: Left;   LUMBAR LAMINECTOMY/DECOMPRESSION MICRODISCECTOMY Left 08/15/2015   Procedure: Left Lumbar four-five Microdiskectomy;  Surgeon: Victory Gens, MD;  Location: [facility];  Service: Neurosurgery;  Laterality: Left;   LUMBAR LAMINECTOMY/DECOMPRESSION MICRODISCECTOMY Left 09/28/2016   Procedure: LEFT LUMBAR FIVE -SACRAL ONE Microdiscectomy;  Surgeon: Gens Victory, MD;  Location: [facility];  Service: Neurosurgery;  Laterality: Left;   TUBAL LIGATION Bilateral 11/16/2017   Procedure: POST PARTUM TUBAL LIGATION;  Surgeon: Izell Harari, MD;  Location: [facility];  Service: Gynecology;  Laterality: Bilateral;    Current Outpatient Medications  Medication Sig Dispense Refill   albuterol  (VENTOLIN  HFA) 108 (90 Base) MCG/ACT inhaler Inhale 1-2 puffs into the lungs every 4 (four) hours as needed for wheezing or shortness of breath. 1 each 0   amoxicillin -clavulanate (AUGMENTIN ) 875-125 MG tablet Take 1 tablet by mouth every 12 (twelve) hours for 10 days. 20 tablet 0   Baclofen  5 MG TABS Take 2 tablets (10 mg total) by mouth 2 (two) times daily as needed. 40 tablet 0   clonazePAM (KLONOPIN) 1 MG tablet Take 1 mg by mouth 3 (three) times daily  as needed. RAN OUT     diclofenac  (VOLTAREN ) 50 MG EC tablet Take 1 tablet (50 mg total) by mouth 2 (two) times daily. 30 tablet 2   fluticasone  (FLONASE ) 50 MCG/ACT nasal spray Place 2 sprays into both nostrils daily. 16 g 0   ondansetron  (ZOFRAN -ODT) 4 MG disintegrating tablet Take 1 tablet (4 mg total) by mouth every 8 (eight) hours as needed for nausea or vomiting. 20 tablet 0   rosuvastatin (CRESTOR) 5 MG tablet Take 5 mg by mouth daily.     sertraline (ZOLOFT) 100 MG tablet Take 0.5 tablets by mouth daily.     Spacer/Aero-Holding Chambers (AEROCHAMBER MV) inhaler Use as instructed 1 each 1   traZODone  (DESYREL ) 50 MG tablet Take 50 mg by mouth at bedtime.     triamcinolone  cream (KENALOG ) 0.1 % Apply 1 Application topically 2 (  two) times daily. Apply for 2 weeks. May use on face 30 g 0   No current facility-administered medications for this visit.    Allergies as of 12/06/2023 - Review Complete 12/06/2023  Allergen Reaction Noted   Banana Itching 12/09/2012   Latex Itching and Rash 05/08/2011   Bioflavonoids Other (See Comments) 01/15/2022   Citrus Other (See Comments) 08/22/2015   Hydrocodone  Hives 01/15/2022   Miconazole Other (See Comments) and Rash 01/21/2015    Vitals: BP 117/84 (BP Location: Right Arm, Patient Position: Sitting, Cuff Size: Normal)   Pulse 84   Ht 5' 2 (1.575 m)   Wt 242 lb (109.8 kg)   LMP 10/31/2023 (Approximate)   SpO2 98%   BMI 44.26 kg/m  Last Weight:  Wt Readings from Last 1 Encounters:  12/06/23 242 lb (109.8 kg)   Last Height:   Ht Readings from Last 1 Encounters:  12/06/23 5' 2 (1.575 m)     Physical exam: Exam: Gen: NAD, conversant, well nourised, obese, well groomed                     CV: RRR, no MRG. No Carotid Bruits. No peripheral edema, warm, nontender Eyes: Conjunctivae clear without exudates or hemorrhage  Neuro: Detailed Neurologic Exam  Speech:    Speech is normal; fluent and spontaneous with normal comprehension.   Cognition:    The patient is oriented to person, place, and time;     recent and remote memory intact;     language fluent;     normal attention, concentration,     fund of knowledge Cranial Nerves:    The pupils are equal, round, and reactive to light. Pupils too small to visualize fundi.  Visual fields are full to finger confrontation. Extraocular movements are intact. Trigeminal sensation is intact and the muscles of mastication are normal. The face is symmetric. The palate elevates in the midline. Hearing intact. Voice is normal. Shoulder shrug is normal. The tongue has normal motion without fasciculations.   Coordination:    Normal finger to nose and heel to shin. Normal rapid alternating movements.   Gait:    Heel-toe and tandem gait are normal.   Motor Observation:    No asymmetry, no atrophy, and no involuntary movements noted. Tone:    Normal muscle tone.    Posture:    Posture is normal. normal erect    Strength:    Strength is V/V in the upper and lower limbs.      Sensation: intact to LT     Reflex Exam:  DTR's:    Deep tendon reflexes in the upper and lower extremities are symmetrically bilaterally.   Toes:    The toes are downgoing bilaterally.   Clonus:    Clonus is absent.    Assessment/Plan:  Patient with chronic migraines, did exceptionally well with emgality in the past > 80% improvement with migraine and headache freq and severity with decreased utilization of act management medications.  PCP placed referral for sleep study Pcp prescribed nurtec Will prescribe emgality, was extremely effective in the past Extensive sinus disease on CT head, follow up with pcp, sshe has a nasal septum defect she states she uses cocaine and is aware of the association and the risks, encouraged to get help and she is pursuing this Start Emgality Discussed goody powders, significant risks, nurtec given by pcp helps to reduce   No orders of the defined types were  placed in this encounter.  No orders of the defined types were placed in this encounter.   Cc: Aurea Ethel NOVAK, NP,  Lenon Nell SAILOR, FNP  Onetha Epp, MD  [facility] [address] [place], [place] [address]  Phone [phone] Fax [phone]

## 2023-12-06 NOTE — Patient Instructions (Signed)
 Start emgality Do not get prgenant, must be off of emgality for 6 months prior to pregnancy  Galcanezumab Injection What is this medication? GALCANEZUMAB (gal ka NEZ ue mab) prevents migraines. It works by blocking a substance in the body that causes migraines. It may also be used to treat cluster headaches. It is a monoclonal antibody. This medicine may be used for other purposes; ask your health care provider or pharmacist if you have questions. COMMON BRAND NAME(S): Emgality What should I tell my care team before I take this medication? They need to know if you have any of these conditions: Circulation problems in fingers or toes (Raynaud syndrome) High blood pressure An unusual or allergic reaction to galcanezumab, other medications, foods, dyes, or preservatives Pregnant or trying to get pregnant Breastfeeding How should I use this medication? This medication is injected under the skin. You will be taught how to prepare and give it. Take it as directed on the prescription label. Keep taking it unless your care team tells you to stop. It is important that you put your used needles and syringes in a special sharps container. Do not put them in a trash can. If you do not have a sharps container, call your pharmacist or care team to get one. Talk to your care team about the use of this medication in children. Special care may be needed. Overdosage: If you think you have taken too much of this medicine contact a poison control center or emergency room at once. NOTE: This medicine is only for you. Do not share this medicine with others. What if I miss a dose? If you miss a dose, take it as soon as you can. If it is almost time for your next dose, take only that dose. Do not take double or extra doses. What may interact with this medication? Interactions are not expected. This list may not describe all possible interactions. Give your health care provider a list of all the medicines, herbs,  non-prescription drugs, or dietary supplements you use. Also tell them if you smoke, drink alcohol, or use illegal drugs. Some items may interact with your medicine. What should I watch for while using this medication? Visit your care team for regular checks on your progress. Tell your care team if your symptoms do not start to get better or if they get worse. What side effects may I notice from receiving this medication? Side effects that you should report to your care team as soon as possible: Allergic reactions or angioedema--skin rash, itching or hives, swelling of the face, eyes, lips, tongue, arms, or legs, trouble swallowing or breathing Increase in blood pressure Raynaud syndrome--cool, numb, or painful fingers or toes that may change color from pale, to blue, to red Side effects that usually do not require medical attention (report these to your care team if they continue or are bothersome): Pain, redness, or irritation at injection site This list may not describe all possible side effects. Call your doctor for medical advice about side effects. You may report side effects to FDA at 1-800-FDA-1088. Where should I keep my medication? Keep out of the reach of children and pets. Store in a refrigerator or at room temperature between 20 and 25 degrees C (68 and 77 degrees F). Refrigeration (preferred): Store in the refrigerator. Do not freeze. Keep in the original container until you are ready to take it. Remove the dose from the carton about 30 minutes before it is time for you to use it.  If the dose is not used, it may be stored in original container at room temperature for 7 days. Get rid of any unused medication after the expiration date. Room Temperature: This medication may be stored at room temperature for up to 7 days. Keep it in the original container. Protect from light until time of use. If it is stored at room temperature, get rid of any unused medication after 7 days or after it  expires, whichever is first. To get rid of medications that are no longer needed or have expired: Take the medication to a medication take-back program. Check with your pharmacy or law enforcement to find a location. If you cannot return the medication, ask your pharmacist or care team how to get rid of this medication safely. NOTE: This sheet is a summary. It may not cover all possible information. If you have questions about this medicine, talk to your doctor, pharmacist, or health care provider.  2025 Elsevier/Gold Standard (2023-06-20 00:00:00)

## 2024-01-03 ENCOUNTER — Ambulatory Visit

## 2024-01-13 ENCOUNTER — Ambulatory Visit (HOSPITAL_COMMUNITY): Admission: EM | Admit: 2024-01-13 | Discharge: 2024-01-13 | Disposition: A | Discharge status: Home / Self Care

## 2024-01-13 ENCOUNTER — Encounter (HOSPITAL_COMMUNITY): Payer: Self-pay

## 2024-01-13 ENCOUNTER — Other Ambulatory Visit (HOSPITAL_COMMUNITY): Payer: Self-pay

## 2024-01-13 DIAGNOSIS — B349 Viral infection, unspecified: Secondary | ICD-10-CM

## 2024-01-13 DIAGNOSIS — R197 Diarrhea, unspecified: Secondary | ICD-10-CM

## 2024-01-13 NOTE — ED Provider Notes (Signed)
 [facility]    CSN: [id number] Arrival date & time: 01/13/24  1326      History   Chief Complaint Chief Complaint  Patient presents with   Sore Throat   Otalgia   Diarrhea    HPI [name] is a 38 y.o. female.   Patient presents to clinic over concern of sore throat, abdominal pain, diarrhea, hot and cold chills and feeling unwell for the past 4 days or so.  Diarrhea and abdominal pain is since improved.  Has not had any fevers.  Reports she will clean it at [facility] and will come out from [place] warmth of [place] to the [place], thinks this may have caused her symptoms.  Has not had any fevers.  Has had Tylenol , Imodium  and Gatorade light.  Denies recent cocaine use.  Reports her nose is healing.  The history is provided by the patient and medical records.  Sore Throat  Otalgia Diarrhea   Past Medical History:  Diagnosis Date   Abortion in first trimester 08/2016   Anemia    Anxiety    Panic attack   Arthritis    Asthma    Bipolar 1 disorder (HCC)    Cancer (HCC)    Cervical cancer (HCC)    Chronic kidney disease    history of kidney shut down  no current problems   Constipation    DDD (degenerative disc disease), cervical    Edema    Gallstones    GERD (gastroesophageal reflux disease)    High cholesterol    History of anemia    History of renal failure    HPV (human papilloma virus) anogenital infection    Leukocytosis    going to hematologist   Migraine    Obesity    Obesity    Pelvic inflammatory disease (PID) 05-08-11   previous hx. .Hx. childbirth x3-NVD   PID (pelvic inflammatory disease)    PTSD (post-traumatic stress disorder)    Sciatica    Scoliosis     Patient Active Problem List   Diagnosis Date Noted   CAP (community acquired pneumonia) 07/19/2021   Vaginal delivery 11/16/2017   Anemia in pregnancy 08/30/2017   Unwanted fertility 08/23/2017   Obesity affecting pregnancy, antepartum 06/19/2017   Supervision of  high risk pregnancy, antepartum, second trimester 05/29/2017   History of preterm delivery, currently pregnant in second trimester 05/29/2017   Tobacco use in pregnancy, antepartum, second trimester 05/29/2017   Drug use affecting pregnancy in second trimester 05/29/2017   History of premature delivery 08/10/2016   Bipolar disorder (HCC) 08/07/2016   Herniated nucleus pulposus, L4-5 left 11/30/2014   Chronic pain syndrome 10/28/2012   Neck pain 10/28/2012   Back pain 10/28/2012   Osteoarthritis 10/28/2012   Migraine    Asthma     Past Surgical History:  Procedure Laterality Date   ABDOMINAL EXPLORATION SURGERY  approx 38 years old   rlq(due to adhesions around ovaries)-appendix and ovaries remain   ANTERIOR CERVICAL DECOMP/DISCECTOMY FUSION N/A 12/09/2012   Procedure: ANTERIOR CERVICAL DECOMPRESSION/DISCECTOMY FUSION STRUCTURAL ALLOGRAFT TRESTLE PLATE CERVICAL FIVE-SIX,SIX-SEVEN;  Surgeon: Lynwood JONELLE Mill, MD;  Location: [facility];  Service: Neurosurgery;  Laterality: N/A;   BACK SURGERY     CHOLECYSTECTOMY  05/10/2011   Procedure: LAPAROSCOPIC CHOLECYSTECTOMY WITH INTRAOPERATIVE CHOLANGIOGRAM;  Surgeon: Camellia CHRISTELLA Blush, MD,FACS;  Location: [facility];  Service: General;  Laterality: N/A;   LUMBAR LAMINECTOMY/DECOMPRESSION MICRODISCECTOMY Left 11/30/2014   Procedure: Left lumbar four-five Microdiskectomy;  Surgeon:  Victory Gens, MD;  Location: [facility];  Service: Neurosurgery;  Laterality: Left;   LUMBAR LAMINECTOMY/DECOMPRESSION MICRODISCECTOMY Left 08/15/2015   Procedure: Left Lumbar four-five Microdiskectomy;  Surgeon: Victory Gens, MD;  Location: [facility];  Service: Neurosurgery;  Laterality: Left;   LUMBAR LAMINECTOMY/DECOMPRESSION MICRODISCECTOMY Left 09/28/2016   Procedure: LEFT LUMBAR FIVE -SACRAL ONE Microdiscectomy;  Surgeon: Gens Victory, MD;  Location: [facility];  Service: Neurosurgery;  Laterality: Left;   TUBAL LIGATION Bilateral 11/16/2017   Procedure: POST PARTUM TUBAL  LIGATION;  Surgeon: Izell Harari, MD;  Location: [facility];  Service: Gynecology;  Laterality: Bilateral;    OB History     Gravida  6   Para  4   Term  3   Preterm  1   AB  2   Living  4      SAB  2   IAB      Ectopic      Multiple  0   Live Births  4            Home Medications    Prior to Admission medications   Medication Sig Start Date End Date Taking? Authorizing Provider  albuterol  (VENTOLIN  HFA) 108 (90 Base) MCG/ACT inhaler Inhale 1-2 puffs into the lungs every 4 (four) hours as needed for wheezing or shortness of breath. 03/12/23   Van Knee, MD  Baclofen  5 MG TABS Take 2 tablets (10 mg total) by mouth 2 (two) times daily as needed. Patient not taking: Reported on 01/13/2024 04/02/23   Rising, Asberry, PA-C  clonazePAM (KLONOPIN) 1 MG tablet Take 1 mg by mouth 3 (three) times daily as needed. RAN OUT    [provider]  diclofenac  (VOLTAREN ) 50 MG EC tablet Take 1 tablet (50 mg total) by mouth 2 (two) times daily. 11/26/23   Reddick, Johnathan B, NP  fluticasone  (FLONASE ) 50 MCG/ACT nasal spray Place 2 sprays into both nostrils daily. 03/12/23   Van Knee, MD  ondansetron  (ZOFRAN -ODT) 4 MG disintegrating tablet Take 1 tablet (4 mg total) by mouth every 8 (eight) hours as needed for nausea or vomiting. Patient not taking: Reported on 01/13/2024 10/31/23   Reddick, Johnathan B, NP  rosuvastatin (CRESTOR) 5 MG tablet Take 5 mg by mouth daily.    [provider]  sertraline (ZOLOFT) 100 MG tablet Take 0.5 tablets by mouth daily.    [provider]  Spacer/Aero-Holding Chambers (AEROCHAMBER MV) inhaler Use as instructed 03/12/23   Van Knee, MD  traZODone  (DESYREL ) 50 MG tablet Take 50 mg by mouth at bedtime. 12/05/23   [provider]  triamcinolone  cream (KENALOG ) 0.1 % Apply 1 Application topically 2 (two) times daily. Apply for 2 weeks. May use on face 03/12/23   Van Knee, MD     Family History Family History  Problem Relation Age of Onset   Diabetes Mother    Endometriosis Mother    Cancer Mother        lung   Diabetes Father    Heart disease Father    Prostate cancer Maternal Grandfather    Cancer Maternal Grandfather        prostate   Colon polyps Maternal Grandmother    Cirrhosis Paternal Grandfather     Social History Social History   Tobacco Use   Smoking status: Every Day    Current packs/day: 1.00    Average packs/day: 1 pack/day for 12.0 years (12.0 ttl pk-yrs)    Types: Cigarettes   Smokeless tobacco:  Never   Tobacco comments:    trying to quit  Vaping Use   Vaping status: Never Used  Substance Use Topics   Alcohol use: Not Currently    Comment: occasionally   Drug use: Not Currently    Types: Marijuana, Cocaine    Comment: cocaine she states is rec, she states she stopped marijuana 4 months ago     Allergies   Banana, Latex, Bioflavonoids, Citrus, Hydrocodone , and Miconazole   Review of Systems Review of Systems  Per HPI  Physical Exam Triage Vital Signs ED Triage Vitals [01/13/24 1527]  Encounter Vitals Group     BP 123/88     Girls Systolic BP Percentile      Girls Diastolic BP Percentile      Boys Systolic BP Percentile      Boys Diastolic BP Percentile      Pulse Rate 78     Resp 16     Temp 97.7 F (36.5 C)     Temp Source Oral     SpO2 95 %     Weight      Height      Head Circumference      Peak Flow      Pain Score 5     Pain Loc      Pain Education      Exclude from Growth Chart    No data found.  Updated Vital Signs BP 123/88 (BP Location: Left Arm)   Pulse 78   Temp 97.7 F (36.5 C) (Oral)   Resp 16   LMP 12/19/2023 (Approximate)   SpO2 95%   Visual Acuity Right Eye Distance:   Left Eye Distance:   Bilateral Distance:    Right Eye Near:   Left Eye Near:    Bilateral Near:     Physical Exam Vitals and nursing note reviewed.  Constitutional:      Appearance: Normal  appearance. She is well-developed.  HENT:     Head: Normocephalic and atraumatic.     Right Ear: External ear normal.     Left Ear: External ear normal.     Nose: No congestion or rhinorrhea.     Mouth/Throat:     Mouth: Mucous membranes are moist.     Pharynx: Uvula midline.     Tonsils: No tonsillar exudate or tonsillar abscesses. 0 on the right. 0 on the left.  Eyes:     Conjunctiva/sclera: Conjunctivae normal.  Cardiovascular:     Rate and Rhythm: Normal rate and regular rhythm.     Heart sounds: Normal heart sounds. No murmur heard. Pulmonary:     Effort: Pulmonary effort is normal. No respiratory distress.     Breath sounds: No wheezing.  Skin:    General: Skin is warm and dry.  Neurological:     General: No focal deficit present.     Mental Status: She is alert.  Psychiatric:        Mood and Affect: Mood normal.      UC Treatments / Results  Labs (all labs ordered are listed, but only abnormal results are displayed) Labs Reviewed - No data to display  EKG   Radiology No results found.  Procedures Procedures (including critical care time)  Medications Ordered in UC Medications - No data to display  Initial Impression / Assessment and Plan / UC Course  I have reviewed the triage vital signs and the nursing notes.  Pertinent labs & imaging results that were available during  my care of the patient were reviewed by me and considered in my medical decision making (see chart for details).  Vitals and triage reviewed, patient is hemodynamically stable.  Lungs vesicular, heart with regular rhythm.  Tonsils without exudate or swelling.  Uvula midline, low concern for PTA.  Without abdominal pain.  Tolerating p.o. fluids.  Symptoms appear viral in nature, symptomatic management discussed.  Plan of care, follow-up care return precautions given, no questions at this time.     Final Clinical Impressions(s) / UC Diagnoses   Final diagnoses:  Viral syndrome   Diarrhea, unspecified type     Discharge Instructions      Overall, your physical exam was reassuring.  Continue oral rehydration with at least 64 ounces of water daily.  I have attached instructions for a bland diet to help with your diarrhea.  This includes bland foods such as bananas, rice, toast and applesauce.  Avoid fried or spicy foods as well as heavily processed foods as it can irritate the GI tract.  For pain you can take ibuprofen  600 mg every 6-8 hours.  Symptoms should continue to improve.  If no improvement or any changes follow-up with your primary care provider or return to clinic for reevaluation.      ED Prescriptions   None    PDMP not reviewed this encounter.   Dreama, Wynston Romey  N, FNP 01/13/24 1615

## 2024-01-13 NOTE — ED Triage Notes (Signed)
 Patient reports that she has had a sore throat, bilateral ear pain and diarrhea x 4 days.  Patient states she has taken Tylenol  sinus, Imodium , and drank electrolytes.

## 2024-01-13 NOTE — Discharge Instructions (Addendum)
 Overall, your physical exam was reassuring.  Continue oral rehydration with at least 64 ounces of water daily.  I have attached instructions for a bland diet to help with your diarrhea.  This includes bland foods such as bananas, rice, toast and applesauce.  Avoid fried or spicy foods as well as heavily processed foods as it can irritate the GI tract.  For pain you can take ibuprofen  600 mg every 6-8 hours.  Symptoms should continue to improve.  If no improvement or any changes follow-up with your primary care provider or return to clinic for reevaluation.

## 2024-01-16 ENCOUNTER — Encounter (HOSPITAL_COMMUNITY): Payer: Self-pay | Admitting: Emergency Medicine

## 2024-01-16 ENCOUNTER — Ambulatory Visit (HOSPITAL_COMMUNITY): Admission: EM | Admit: 2024-01-16 | Discharge: 2024-01-16 | Disposition: A | Discharge status: Home / Self Care

## 2024-01-16 DIAGNOSIS — J019 Acute sinusitis, unspecified: Secondary | ICD-10-CM

## 2024-01-16 DIAGNOSIS — J45901 Unspecified asthma with (acute) exacerbation: Secondary | ICD-10-CM | POA: Diagnosis not present

## 2024-01-16 MED ORDER — AMOXICILLIN-POT CLAVULANATE 875-125 MG PO TABS: 1.0000 | ORAL_TABLET | Freq: Two times a day (BID) | ORAL | 0 refills | Status: DC

## 2024-01-16 MED ORDER — PREDNISONE 20 MG PO TABS: 40.0000 mg | ORAL_TABLET | Freq: Every day | ORAL | 0 refills | Status: AC

## 2024-01-16 NOTE — ED Provider Notes (Signed)
 [facility]    CSN: [id number] Arrival date & time: 01/16/24  1451      History   Chief Complaint Chief Complaint  Patient presents with   Headache   Nasal Congestion    HPI [name] is a 38 y.o. female.   HPI Pt is here today with concerns for productive coughing, nasal congestion, rhinorrhea, fatigue, ear fullness She states that her child's father was recently admitted to hospital for pneumonia. She reports that she has a previous hx of asthma  She states her symptoms have been ongoing for about a week - she was seen about 3 days ago at this UC and told that she likely has a viral URI She has been taking Vitamin D supplement and Diclofenac  per recommendations She has an albuterol  inhaler to assist with her asthma and she has used a breathing treatment of her daughter's to help with her breathing-did this about 2 weeks ago  She states that she has been taking Benadryl  and Sudafed to help with her symptoms    Past Medical History:  Diagnosis Date   Abortion in first trimester 08/2016   Anemia    Anxiety    Panic attack   Arthritis    Asthma    Bipolar 1 disorder (HCC)    Cancer (HCC)    Cervical cancer (HCC)    Chronic kidney disease    history of kidney shut down  no current problems   Constipation    DDD (degenerative disc disease), cervical    Edema    Gallstones    GERD (gastroesophageal reflux disease)    High cholesterol    History of anemia    History of renal failure    HPV (human papilloma virus) anogenital infection    Leukocytosis    going to hematologist   Migraine    Obesity    Obesity    Pelvic inflammatory disease (PID) 05-08-11   previous hx. .Hx. childbirth x3-NVD   PID (pelvic inflammatory disease)    PTSD (post-traumatic stress disorder)    Sciatica    Scoliosis     Patient Active Problem List   Diagnosis Date Noted   CAP (community acquired pneumonia) 07/19/2021   Vaginal delivery 11/16/2017   Anemia in  pregnancy 08/30/2017   Unwanted fertility 08/23/2017   Obesity affecting pregnancy, antepartum 06/19/2017   Supervision of high risk pregnancy, antepartum, second trimester 05/29/2017   History of preterm delivery, currently pregnant in second trimester 05/29/2017   Tobacco use in pregnancy, antepartum, second trimester 05/29/2017   Drug use affecting pregnancy in second trimester 05/29/2017   History of premature delivery 08/10/2016   Bipolar disorder (HCC) 08/07/2016   Herniated nucleus pulposus, L4-5 left 11/30/2014   Chronic pain syndrome 10/28/2012   Neck pain 10/28/2012   Back pain 10/28/2012   Osteoarthritis 10/28/2012   Migraine    Asthma     Past Surgical History:  Procedure Laterality Date   ABDOMINAL EXPLORATION SURGERY  approx 38 years old   rlq(due to adhesions around ovaries)-appendix and ovaries remain   ANTERIOR CERVICAL DECOMP/DISCECTOMY FUSION N/A 12/09/2012   Procedure: ANTERIOR CERVICAL DECOMPRESSION/DISCECTOMY FUSION STRUCTURAL ALLOGRAFT TRESTLE PLATE CERVICAL FIVE-SIX,SIX-SEVEN;  Surgeon: Lynwood JONELLE Mill, MD;  Location: [facility];  Service: Neurosurgery;  Laterality: N/A;   BACK SURGERY     CHOLECYSTECTOMY  05/10/2011   Procedure: LAPAROSCOPIC CHOLECYSTECTOMY WITH INTRAOPERATIVE CHOLANGIOGRAM;  Surgeon: Camellia CHRISTELLA Blush, MD,FACS;  Location: [facility];  Service: General;  Laterality: N/A;  LUMBAR LAMINECTOMY/DECOMPRESSION MICRODISCECTOMY Left 11/30/2014   Procedure: Left lumbar four-five Microdiskectomy;  Surgeon: Victory Gens, MD;  Location: [facility];  Service: Neurosurgery;  Laterality: Left;   LUMBAR LAMINECTOMY/DECOMPRESSION MICRODISCECTOMY Left 08/15/2015   Procedure: Left Lumbar four-five Microdiskectomy;  Surgeon: Victory Gens, MD;  Location: [facility];  Service: Neurosurgery;  Laterality: Left;   LUMBAR LAMINECTOMY/DECOMPRESSION MICRODISCECTOMY Left 09/28/2016   Procedure: LEFT LUMBAR FIVE -SACRAL ONE Microdiscectomy;  Surgeon: Gens Victory, MD;  Location:  [facility];  Service: Neurosurgery;  Laterality: Left;   TUBAL LIGATION Bilateral 11/16/2017   Procedure: POST PARTUM TUBAL LIGATION;  Surgeon: Izell Harari, MD;  Location: [facility];  Service: Gynecology;  Laterality: Bilateral;    OB History     Gravida  6   Para  4   Term  3   Preterm  1   AB  2   Living  4      SAB  2   IAB      Ectopic      Multiple  0   Live Births  4            Home Medications    Prior to Admission medications   Medication Sig Start Date End Date Taking? Authorizing Provider  amoxicillin -clavulanate (AUGMENTIN ) 875-125 MG tablet Take 1 tablet by mouth every 12 (twelve) hours. 01/16/24  Yes Leeyah Heather E, PA-C  predniSONE  (DELTASONE ) 20 MG tablet Take 2 tablets (40 mg total) by mouth daily for 5 days. 01/16/24 01/21/24 Yes Nicholes Hibler E, PA-C  albuterol  (VENTOLIN  HFA) 108 (90 Base) MCG/ACT inhaler Inhale 1-2 puffs into the lungs every 4 (four) hours as needed for wheezing or shortness of breath. 03/12/23   Van Knee, MD  Baclofen  5 MG TABS Take 2 tablets (10 mg total) by mouth 2 (two) times daily as needed. Patient not taking: Reported on 01/13/2024 04/02/23   Rising, Asberry, PA-C  clonazePAM (KLONOPIN) 1 MG tablet Take 1 mg by mouth 3 (three) times daily as needed. RAN OUT    [provider]  cyclobenzaprine  (FLEXERIL ) 5 MG tablet Take 5 mg by mouth 3 (three) times daily.    [provider]  diclofenac  (VOLTAREN ) 50 MG EC tablet Take 1 tablet (50 mg total) by mouth 2 (two) times daily. 11/26/23   Reddick, Johnathan B, NP  fluticasone  (FLONASE ) 50 MCG/ACT nasal spray Place 2 sprays into both nostrils daily. 03/12/23   Van Knee, MD  ondansetron  (ZOFRAN -ODT) 4 MG disintegrating tablet Take 1 tablet (4 mg total) by mouth every 8 (eight) hours as needed for nausea or vomiting. Patient not taking: Reported on 01/13/2024 10/31/23   Reddick, Johnathan B, NP  rosuvastatin (CRESTOR) 5 MG tablet Take 5 mg by mouth  daily.    [provider]  sertraline (ZOLOFT) 100 MG tablet Take 0.5 tablets by mouth daily.    [provider]  Spacer/Aero-Holding Chambers (AEROCHAMBER MV) inhaler Use as instructed 03/12/23   Van Knee, MD  traZODone  (DESYREL ) 50 MG tablet Take 50 mg by mouth at bedtime. 12/05/23   [provider]  triamcinolone  cream (KENALOG ) 0.1 % Apply 1 Application topically 2 (two) times daily. Apply for 2 weeks. May use on face 03/12/23   Van Knee, MD    Family History Family History  Problem Relation Age of Onset   Diabetes Mother    Endometriosis Mother    Cancer Mother        lung   Diabetes Father  Heart disease Father    Prostate cancer Maternal Grandfather    Cancer Maternal Grandfather        prostate   Colon polyps Maternal Grandmother    Cirrhosis Paternal Grandfather     Social History Social History   Tobacco Use   Smoking status: Every Day    Current packs/day: 1.00    Average packs/day: 1 pack/day for 12.0 years (12.0 ttl pk-yrs)    Types: Cigarettes   Smokeless tobacco: Never   Tobacco comments:    trying to quit  Vaping Use   Vaping status: Never Used  Substance Use Topics   Alcohol use: Not Currently    Comment: occasionally   Drug use: Not Currently    Types: Marijuana, Cocaine    Comment: cocaine she states is rec, she states she stopped marijuana 4 months ago     Allergies   Banana, Latex, Bioflavonoids, Citrus, Hydrocodone , and Miconazole   Review of Systems Review of Systems  Constitutional:  Positive for fatigue.  HENT:  Positive for congestion, rhinorrhea and sore throat.   Respiratory:  Positive for cough and shortness of breath.      Physical Exam Triage Vital Signs ED Triage Vitals [01/16/24 1537]  Encounter Vitals Group     BP 123/86     Girls Systolic BP Percentile      Girls Diastolic BP Percentile      Boys Systolic BP Percentile      Boys Diastolic BP Percentile      Pulse Rate 90      Resp 18     Temp 98.1 F (36.7 C)     Temp Source Oral     SpO2 96 %     Weight      Height      Head Circumference      Peak Flow      Pain Score 6     Pain Loc      Pain Education      Exclude from Growth Chart    No data found.  Updated Vital Signs BP 123/86 (BP Location: Left Arm)   Pulse 90   Temp 98.1 F (36.7 C) (Oral)   Resp 18   LMP 12/19/2023 (Approximate)   SpO2 96%   Visual Acuity Right Eye Distance:   Left Eye Distance:   Bilateral Distance:    Right Eye Near:   Left Eye Near:    Bilateral Near:     Physical Exam Vitals reviewed.  Constitutional:      General: She is awake.     Appearance: Normal appearance. She is well-developed and well-groomed.  HENT:     Head: Normocephalic and atraumatic.     Right Ear: Hearing, tympanic membrane and ear canal normal.     Left Ear: Hearing, tympanic membrane and ear canal normal.     Nose: Congestion and rhinorrhea present.     Mouth/Throat:     Lips: Pink.     Mouth: Mucous membranes are moist.     Pharynx: Oropharynx is clear. Uvula midline. No pharyngeal swelling, oropharyngeal exudate, posterior oropharyngeal erythema, uvula swelling or postnasal drip.  Cardiovascular:     Rate and Rhythm: Normal rate and regular rhythm.     Pulses: Normal pulses.          Radial pulses are 2+ on the right side and 2+ on the left side.     Heart sounds: Normal heart sounds. No murmur heard.    No  friction rub. No gallop.  Pulmonary:     Effort: Pulmonary effort is normal.     Breath sounds: Normal breath sounds. Decreased air movement present. No decreased breath sounds, wheezing, rhonchi or rales.  Musculoskeletal:     Cervical back: Normal range of motion and neck supple.  Lymphadenopathy:     Head:     Right side of head: No submental, submandibular or preauricular adenopathy.     Left side of head: No submental, submandibular or preauricular adenopathy.     Cervical:     Right cervical: No superficial  cervical adenopathy.    Left cervical: No superficial cervical adenopathy.     Upper Body:     Right upper body: No supraclavicular adenopathy.     Left upper body: No supraclavicular adenopathy.  Neurological:     Mental Status: She is alert.  Psychiatric:        Behavior: Behavior is cooperative.      UC Treatments / Results  Labs (all labs ordered are listed, but only abnormal results are displayed) Labs Reviewed - No data to display  EKG   Radiology No results found.  Procedures Procedures (including critical care time)  Medications Ordered in UC Medications - No data to display  Initial Impression / Assessment and Plan / UC Course  I have reviewed the triage vital signs and the nursing notes.  Pertinent labs & imaging results that were available during my care of the patient were reviewed by me and considered in my medical decision making (see chart for details).      Final Clinical Impressions(s) / UC Diagnoses   Final diagnoses:  Acute sinusitis, recurrence not specified, unspecified location  Asthma with acute exacerbation, unspecified asthma severity, unspecified whether persistent   Patient presents today with concerns for nasal congestion, coughing, shortness of breath, rhinorrhea has been ongoing for over a week.  She reports minimal improvement with OTC medications.  She was seen here at urgent care on 01/13/2024 and was diagnosed with viral upper respiratory infection.  She reports minimal improvement since that visit and states that her shortness of breath seems to be getting worse.  She does have previous history of asthma and is being worked up for potential COPD.  Physical exam is notable for congestion, decreased air movement bilaterally.  Will start patient on prednisone  40 mg p.o. daily x 5-day burst as well as Augmentin  p.o. twice daily x 7 days.  Recommend continued use of OTC medications and rescue inhaler to assist with symptoms.  ED and return  precautions reviewed and provided in AVS.  Follow-up as needed.      Discharge Instructions      Based on your symptoms and duration of illness, I believe you may have a bacterial sinus infection  These typically resolve with antibiotic therapy along with at-home comfort measures  Today I have sent in a prescription for  Augmentin  875-125 mg to be taken by mouth twice per day for 7 days FINISH THE ENTIRE COURSE unless you develop an allergic reaction or are instructed to discontinue.  It can take a few days for the antibiotic to kick in so I recommend symptomatic relief with over the counter medication such as the following: Dayquil/ Nyquil Theraflu Alkaseltzer   If you have high blood pressure I recommend that you take Mucinex , Robitussin, Tylenol  instead of the combination medications.  Combination medications typically have a decongestant in them that can cause your blood pressure to be high.  These medications typically have Tylenol  in them already so you do not need to supplement with more outside of those medications  Stay well hydrated with at least 75 oz of water per day to help with recovery  If at any point you start to develop swelling around the eyes and nose, trouble seeing, fevers that are not responding to medications, trouble breathing, passing out or headaches that are very severe please go to the emergency room for further evaluation management      ED Prescriptions     Medication Sig Dispense Auth. Provider   predniSONE  (DELTASONE ) 20 MG tablet Take 2 tablets (40 mg total) by mouth daily for 5 days. 10 tablet Roxanna Mcever E, PA-C   amoxicillin -clavulanate (AUGMENTIN ) 875-125 MG tablet Take 1 tablet by mouth every 12 (twelve) hours. 14 tablet Nazire Fruth E, PA-C      PDMP not reviewed this encounter.   Marylene Rocky BRAVO, PA-C 01/16/24 [id number]

## 2024-01-16 NOTE — Discharge Instructions (Signed)
 Based on your symptoms and duration of illness, I believe you may have a bacterial sinus infection  These typically resolve with antibiotic therapy along with at-home comfort measures  Today I have sent in a prescription for  Augmentin 875-125 mg to be taken by mouth twice per day for 7 days FINISH THE ENTIRE COURSE unless you develop an allergic reaction or are instructed to discontinue.  It can take a few days for the antibiotic to kick in so I recommend symptomatic relief with over the counter medication such as the following: Dayquil/ Nyquil Theraflu Alkaseltzer   If you have high blood pressure I recommend that you take Mucinex, Robitussin, Tylenol instead of the combination medications.  Combination medications typically have a decongestant in them that can cause your blood pressure to be high.  These medications typically have Tylenol in them already so you do not need to supplement with more outside of those medications  Stay well hydrated with at least 75 oz of water per day to help with recovery  If at any point you start to develop swelling around the eyes and nose, trouble seeing, fevers that are not responding to medications, trouble breathing, passing out or headaches that are very severe please go to the emergency room for further evaluation management

## 2024-01-16 NOTE — ED Triage Notes (Signed)
 Pt seen here 10/20 for same symptoms. Was told to take Ibuprofen  and her regular medications for her symptoms. Reports has taken medications and used inhaler. Still having headache, congestion with green phlegm.

## 2024-01-30 ENCOUNTER — Ambulatory Visit (HOSPITAL_COMMUNITY)
Admission: EM | Admit: 2024-01-30 | Discharge: 2024-01-30 | Disposition: A | Discharge status: Home / Self Care | Attending: Family Medicine | Admitting: Family Medicine

## 2024-01-30 ENCOUNTER — Encounter (HOSPITAL_COMMUNITY): Payer: Self-pay

## 2024-01-30 DIAGNOSIS — J069 Acute upper respiratory infection, unspecified: Secondary | ICD-10-CM | POA: Diagnosis not present

## 2024-01-30 DIAGNOSIS — R062 Wheezing: Secondary | ICD-10-CM

## 2024-01-30 MED ORDER — DEXAMETHASONE SOD PHOSPHATE PF 10 MG/ML IJ SOLN
10.0000 mg | Freq: Once | INTRAMUSCULAR | Status: AC
  Administered 2024-01-30: 10 mg via INTRAMUSCULAR

## 2024-01-30 MED ORDER — KETOROLAC TROMETHAMINE 60 MG/2ML IM SOLN
60.0000 mg | Freq: Once | INTRAMUSCULAR | Status: AC
  Administered 2024-01-30: 60 mg via INTRAMUSCULAR

## 2024-01-30 MED ORDER — PROMETHAZINE-DM 6.25-15 MG/5ML PO SYRP: 5.0000 mL | ORAL_SOLUTION | Freq: Four times a day (QID) | ORAL | 0 refills | Status: DC | PRN

## 2024-01-30 MED ORDER — KETOROLAC TROMETHAMINE 60 MG/2ML IM SOLN
INTRAMUSCULAR | Status: AC
  Filled 2024-01-30: qty 2

## 2024-01-30 NOTE — Discharge Instructions (Signed)
 Meds ordered this encounter  Medications   ketorolac  (TORADOL ) injection 60 mg   dexamethasone  (DECADRON ) injection 10 mg   promethazine -dextromethorphan (PROMETHAZINE -DM) 6.25-15 MG/5ML syrup    Sig: Take 5 mLs by mouth 4 (four) times daily as needed for cough.    Dispense:  118 mL    Refill:  0   You have a cold with a cough. This should improve over the next 3-4 days.

## 2024-01-30 NOTE — ED Provider Notes (Signed)
 [facility]   [id number] 01/30/24 Arrival Time: 1812  ASSESSMENT & PLAN:  1. Viral URI with cough   2. Wheezing    Discussed typical duration of likely viral illness with mild wheezing; is a smoker.    Discharge Instructions       Meds ordered this encounter  Medications   ketorolac  (TORADOL ) injection 60 mg   dexamethasone  (DECADRON ) injection 10 mg   promethazine -dextromethorphan (PROMETHAZINE -DM) 6.25-15 MG/5ML syrup    Sig: Take 5 mLs by mouth 4 (four) times daily as needed for cough.    Dispense:  118 mL    Refill:  0   You have a cold with a cough. This should improve over the next 3-4 days.     Follow-up Information     Lenon Nell SAILOR, FNP.   Specialty: Nurse Practitioner Why: If worsening or failing to improve as anticipated. Contact information: [address] [place] [address] [phone]                 Reviewed expectations re: course of current medical issues. Questions answered. Outlined signs and symptoms indicating need for more acute intervention. Understanding verbalized. After Visit Summary given.   SUBJECTIVE: History from: Patient. [name] is a 38 y.o. female. Pt c/o cough and SOB; abrupt onset x3 days. With body aches. Denies fever. States pain to chest when taking a deep breathe. NAD, speaking in complete sentences.   OBJECTIVE:  Vitals:   01/30/24 1841 01/30/24 1923  BP:  120/83  Pulse: (!) 104 93  Resp: 20 20  Temp:  98 F (36.7 C)  TempSrc:  Oral  SpO2: 97% 96%    General appearance: alert; no distress Eyes: PERRLA; EOMI; conjunctiva normal HENT: [place]; AT; with nasal congestion Neck: supple  Lungs: speaks full sentences without difficulty; unlabored; wheezing a little bilaterally Extremities: no edema Skin: warm and dry Neurologic: normal gait Psychological: alert and cooperative; normal mood and affect  Labs:  Labs Reviewed - No data to display  Imaging: No  results found.  Allergies  Allergen Reactions   Banana Itching      Itchy throat   Latex Itching and Rash    Red spots   Bioflavonoids Other (See Comments)   Citrus Other (See Comments)    Mouth pain, acid reflux   Hydrocodone  Hives   Miconazole Other (See Comments) and Rash    Paradoxical effect: increases yeast (also)    Past Medical History:  Diagnosis Date   Abortion in first trimester 08/2016   Anemia    Anxiety    Panic attack   Arthritis    Asthma    Bipolar 1 disorder (HCC)    Cancer (HCC)    Cervical cancer (HCC)    Chronic kidney disease    history of kidney shut down  no current problems   Constipation    DDD (degenerative disc disease), cervical    Edema    Gallstones    GERD (gastroesophageal reflux disease)    High cholesterol    History of anemia    History of renal failure    HPV (human papilloma virus) anogenital infection    Leukocytosis    going to hematologist   Migraine    Obesity    Obesity    Pelvic inflammatory disease (PID) 05-08-11   previous hx. .Hx. childbirth x3-NVD   PID (pelvic inflammatory disease)    PTSD (post-traumatic stress disorder)    Sciatica  Scoliosis    Social History   Socioeconomic History   Marital status: Single    Spouse name: Not on file   Number of children: 3   Years of education: Not on file   Highest education level: Not on file  Occupational History   Occupation: UNEMPLOYED    Employer: UNEMPLOYED  Tobacco Use   Smoking status: Every Day    Current packs/day: 1.00    Average packs/day: 1 pack/day for 12.0 years (12.0 ttl pk-yrs)    Types: Cigarettes   Smokeless tobacco: Never   Tobacco comments:    trying to quit  Vaping Use   Vaping status: Never Used  Substance and Sexual Activity   Alcohol use: Not Currently    Comment: occasionally   Drug use: Not Currently    Types: Marijuana, Cocaine    Comment: cocaine she states is rec, she states she stopped marijuana 4 months ago   Sexual  activity: Yes    Birth control/protection: None  Other Topics Concern   Not on file  Social History Narrative   Not on file   Social Drivers of Health   Financial Resource Strain: Medium Risk (11/15/2017)   Overall Financial Resource Strain (CARDIA)    Difficulty of Paying Living Expenses: Somewhat hard  Food Insecurity: Food Insecurity Present (11/15/2017)   Hunger Vital Sign    Worried About Running Out of Food in the Last Year: Sometimes true    Ran Out of Food in the Last Year: Not on file  Transportation Needs: No Transportation Needs (11/15/2017)   PRAPARE - Administrator, Civil Service (Medical): No    Lack of Transportation (Non-Medical): No  Physical Activity: Inactive (11/15/2017)   Exercise Vital Sign    Days of Exercise per Week: 0 days    Minutes of Exercise per Session: 0 min  Stress: No Stress Concern Present (11/15/2017)   [employer] of Occupational Health - Occupational Stress Questionnaire    Feeling of Stress : Not at all  Social Connections: Not on file  Intimate Partner Violence: Not At Risk (11/15/2017)   Humiliation, Afraid, Rape, and Kick questionnaire    Fear of Current or Ex-Partner: No    Emotionally Abused: No    Physically Abused: No    Sexually Abused: No   Family History  Problem Relation Age of Onset   Diabetes Mother    Endometriosis Mother    Cancer Mother        lung   Diabetes Father    Heart disease Father    Prostate cancer Maternal Grandfather    Cancer Maternal Grandfather        prostate   Colon polyps Maternal Grandmother    Cirrhosis Paternal Grandfather    Past Surgical History:  Procedure Laterality Date   ABDOMINAL EXPLORATION SURGERY  approx 38 years old   rlq(due to adhesions around ovaries)-appendix and ovaries remain   ANTERIOR CERVICAL DECOMP/DISCECTOMY FUSION N/A 12/09/2012   Procedure: ANTERIOR CERVICAL DECOMPRESSION/DISCECTOMY FUSION STRUCTURAL ALLOGRAFT TRESTLE PLATE CERVICAL FIVE-SIX,SIX-SEVEN;   Surgeon: Lynwood JONELLE Mill, MD;  Location: [facility];  Service: Neurosurgery;  Laterality: N/A;   BACK SURGERY     CHOLECYSTECTOMY  05/10/2011   Procedure: LAPAROSCOPIC CHOLECYSTECTOMY WITH INTRAOPERATIVE CHOLANGIOGRAM;  Surgeon: Camellia CHRISTELLA Blush, MD,FACS;  Location: [facility];  Service: General;  Laterality: N/A;   LUMBAR LAMINECTOMY/DECOMPRESSION MICRODISCECTOMY Left 11/30/2014   Procedure: Left lumbar four-five Microdiskectomy;  Surgeon: Victory Gens, MD;  Location: [facility];  Service: Neurosurgery;  Laterality: Left;   LUMBAR LAMINECTOMY/DECOMPRESSION MICRODISCECTOMY Left 08/15/2015   Procedure: Left Lumbar four-five Microdiskectomy;  Surgeon: Victory Gens, MD;  Location: [facility];  Service: Neurosurgery;  Laterality: Left;   LUMBAR LAMINECTOMY/DECOMPRESSION MICRODISCECTOMY Left 09/28/2016   Procedure: LEFT LUMBAR FIVE -SACRAL ONE Microdiscectomy;  Surgeon: Gens Victory, MD;  Location: [facility];  Service: Neurosurgery;  Laterality: Left;   TUBAL LIGATION Bilateral 11/16/2017   Procedure: POST PARTUM TUBAL LIGATION;  Surgeon: Izell Harari, MD;  Location: [facility];  Service: Gynecology;  Laterality: Bilateral;     Rolinda Rogue, MD 01/30/24 [phone]

## 2024-01-30 NOTE — ED Triage Notes (Signed)
 Pt c/o cough and SOB x3 days. States pain to chest when taking a deep breathe. NAD, speaking in complete sentences.

## 2024-02-10 ENCOUNTER — Emergency Department (HOSPITAL_COMMUNITY)

## 2024-02-10 ENCOUNTER — Emergency Department (HOSPITAL_COMMUNITY)
Admission: EM | Admit: 2024-02-10 | Discharge: 2024-02-10 | Discharge status: Against Medical Advice | Attending: Emergency Medicine | Admitting: Emergency Medicine

## 2024-02-10 ENCOUNTER — Other Ambulatory Visit: Payer: Self-pay

## 2024-02-10 ENCOUNTER — Encounter (HOSPITAL_COMMUNITY): Payer: Self-pay

## 2024-02-10 DIAGNOSIS — R0981 Nasal congestion: Secondary | ICD-10-CM | POA: Insufficient documentation

## 2024-02-10 DIAGNOSIS — R0602 Shortness of breath: Secondary | ICD-10-CM | POA: Insufficient documentation

## 2024-02-10 DIAGNOSIS — Z5321 Procedure and treatment not carried out due to patient leaving prior to being seen by health care provider: Secondary | ICD-10-CM | POA: Diagnosis not present

## 2024-02-10 DIAGNOSIS — H9202 Otalgia, left ear: Secondary | ICD-10-CM | POA: Diagnosis not present

## 2024-02-10 LAB — CBC
HCT: 33.8 % — ABNORMAL LOW (ref 36.0–46.0)
Hemoglobin: 11 g/dL — ABNORMAL LOW (ref 12.0–15.0)
MCH: 25.7 pg — ABNORMAL LOW (ref 26.0–34.0)
MCHC: 32.5 g/dL (ref 30.0–36.0)
MCV: 79 fL — ABNORMAL LOW (ref 80.0–100.0)
Platelets: 411 K/uL — ABNORMAL HIGH (ref 150–400)
RBC: 4.28 MIL/uL (ref 3.87–5.11)
RDW: 18.9 % — ABNORMAL HIGH (ref 11.5–15.5)
WBC: 10.9 K/uL — ABNORMAL HIGH (ref 4.0–10.5)
nRBC: 0 % (ref 0.0–0.2)

## 2024-02-10 LAB — BASIC METABOLIC PANEL WITH GFR
Anion gap: 14 (ref 5–15)
BUN: 18 mg/dL (ref 6–20)
CO2: 20 mmol/L — ABNORMAL LOW (ref 22–32)
Calcium: 9 mg/dL (ref 8.9–10.3)
Chloride: 105 mmol/L (ref 98–111)
Creatinine, Ser: 1.29 mg/dL — ABNORMAL HIGH (ref 0.44–1.00)
GFR, Estimated: 54 mL/min — ABNORMAL LOW (ref >60)
Glucose, Bld: 74 mg/dL (ref 70–99)
Potassium: 3.7 mmol/L (ref 3.5–5.1)
Sodium: 139 mmol/L (ref 135–145)

## 2024-02-10 LAB — GROUP A STREP BY PCR: Group A Strep by PCR: NOT DETECTED

## 2024-02-10 LAB — RESP PANEL BY RT-PCR (RSV, FLU A&B, COVID)  RVPGX2
Influenza A by PCR: NEGATIVE
Influenza B by PCR: NEGATIVE
Resp Syncytial Virus by PCR: NEGATIVE
SARS Coronavirus 2 by RT PCR: NEGATIVE

## 2024-02-10 LAB — HCG, SERUM, QUALITATIVE: Preg, Serum: NEGATIVE

## 2024-02-10 NOTE — ED Triage Notes (Signed)
 Pt is coming in for continued shortness of breath, ear pain on the right side, sinus congestion, and generally feeling unwel. She mentions that she was Dx with an URI. He daughter is also sick with something very similar and is being seen in PEDs as well.

## 2024-02-10 NOTE — ED Notes (Signed)
Pt did not want to stay and left . 

## 2024-02-13 ENCOUNTER — Ambulatory Visit (HOSPITAL_COMMUNITY)
Admission: EM | Admit: 2024-02-13 | Discharge: 2024-02-13 | Disposition: A | Discharge status: Home / Self Care | Attending: Family Medicine | Admitting: Family Medicine

## 2024-02-13 ENCOUNTER — Other Ambulatory Visit: Payer: Self-pay

## 2024-02-13 ENCOUNTER — Encounter (HOSPITAL_COMMUNITY): Payer: Self-pay | Admitting: Emergency Medicine

## 2024-02-13 ENCOUNTER — Ambulatory Visit (INDEPENDENT_AMBULATORY_CARE_PROVIDER_SITE_OTHER)

## 2024-02-13 DIAGNOSIS — R051 Acute cough: Secondary | ICD-10-CM

## 2024-02-13 DIAGNOSIS — K625 Hemorrhage of anus and rectum: Secondary | ICD-10-CM

## 2024-02-13 DIAGNOSIS — J209 Acute bronchitis, unspecified: Secondary | ICD-10-CM | POA: Diagnosis not present

## 2024-02-13 MED ORDER — PROMETHAZINE-DM 6.25-15 MG/5ML PO SYRP: 5.0000 mL | ORAL_SOLUTION | Freq: Four times a day (QID) | ORAL | 0 refills | Status: AC | PRN

## 2024-02-13 MED ORDER — PREDNISONE 20 MG PO TABS: 40.0000 mg | ORAL_TABLET | Freq: Every day | ORAL | 0 refills | Status: AC

## 2024-02-13 NOTE — ED Provider Notes (Signed)
 [facility]    CSN: [id number] Arrival date & time: 02/13/24  1049      History   Chief Complaint Chief Complaint  Patient presents with   Cough   Rectal Bleeding    HPI [name] is a 38 y.o. female.    Cough Associated symptoms: rhinorrhea and shortness of breath   Rectal Bleeding  Patient is here for a continued cough.   This has been worsening the last 3 days.   She was seen in the UC on 11/6 for the same cough.  She has been using medications which have been helpful, but now worse again.  She is having wheezing, sob, mild.  No fevers.  She has cut back on smoking.   She also has had intermittent blood in her stool.  She does have h/o hemorrhoids, but not sure if related.  The blood has increased slightly.   No heavy bleeding noted.      Past Medical History:  Diagnosis Date   Abortion in first trimester 08/2016   Anemia    Anxiety    Panic attack   Arthritis    Asthma    Bipolar 1 disorder (HCC)    Cancer (HCC)    Cervical cancer (HCC)    Chronic kidney disease    history of kidney shut down  no current problems   Constipation    DDD (degenerative disc disease), cervical    Edema    Gallstones    GERD (gastroesophageal reflux disease)    High cholesterol    History of anemia    History of renal failure    HPV (human papilloma virus) anogenital infection    Leukocytosis    going to hematologist   Migraine    Obesity    Obesity    Pelvic inflammatory disease (PID) 05-08-11   previous hx. .Hx. childbirth x3-NVD   PID (pelvic inflammatory disease)    PTSD (post-traumatic stress disorder)    Sciatica    Scoliosis     Patient Active Problem List   Diagnosis Date Noted   CAP (community acquired pneumonia) 07/19/2021   Vaginal delivery 11/16/2017   Anemia in pregnancy 08/30/2017   Unwanted fertility 08/23/2017   Obesity affecting pregnancy, antepartum 06/19/2017   Supervision of high risk pregnancy, antepartum, second  trimester 05/29/2017   History of preterm delivery, currently pregnant in second trimester 05/29/2017   Tobacco use in pregnancy, antepartum, second trimester 05/29/2017   Drug use affecting pregnancy in second trimester 05/29/2017   History of premature delivery 08/10/2016   Bipolar disorder (HCC) 08/07/2016   Herniated nucleus pulposus, L4-5 left 11/30/2014   Chronic pain syndrome 10/28/2012   Neck pain 10/28/2012   Back pain 10/28/2012   Osteoarthritis 10/28/2012   Migraine    Asthma     Past Surgical History:  Procedure Laterality Date   ABDOMINAL EXPLORATION SURGERY  approx 38 years old   rlq(due to adhesions around ovaries)-appendix and ovaries remain   ANTERIOR CERVICAL DECOMP/DISCECTOMY FUSION N/A 12/09/2012   Procedure: ANTERIOR CERVICAL DECOMPRESSION/DISCECTOMY FUSION STRUCTURAL ALLOGRAFT TRESTLE PLATE CERVICAL FIVE-SIX,SIX-SEVEN;  Surgeon: Lynwood JONELLE Mill, MD;  Location: [facility];  Service: Neurosurgery;  Laterality: N/A;   BACK SURGERY     CHOLECYSTECTOMY  05/10/2011   Procedure: LAPAROSCOPIC CHOLECYSTECTOMY WITH INTRAOPERATIVE CHOLANGIOGRAM;  Surgeon: Camellia CHRISTELLA Blush, MD,FACS;  Location: [facility];  Service: General;  Laterality: N/A;   LUMBAR LAMINECTOMY/DECOMPRESSION MICRODISCECTOMY Left 11/30/2014   Procedure: Left lumbar four-five Microdiskectomy;  Surgeon:  Victory Gens, MD;  Location: [facility];  Service: Neurosurgery;  Laterality: Left;   LUMBAR LAMINECTOMY/DECOMPRESSION MICRODISCECTOMY Left 08/15/2015   Procedure: Left Lumbar four-five Microdiskectomy;  Surgeon: Victory Gens, MD;  Location: [facility];  Service: Neurosurgery;  Laterality: Left;   LUMBAR LAMINECTOMY/DECOMPRESSION MICRODISCECTOMY Left 09/28/2016   Procedure: LEFT LUMBAR FIVE -SACRAL ONE Microdiscectomy;  Surgeon: Gens Victory, MD;  Location: [facility];  Service: Neurosurgery;  Laterality: Left;   TUBAL LIGATION Bilateral 11/16/2017   Procedure: POST PARTUM TUBAL LIGATION;  Surgeon: Izell Harari, MD;   Location: [facility];  Service: Gynecology;  Laterality: Bilateral;    OB History     Gravida  6   Para  4   Term  3   Preterm  1   AB  2   Living  4      SAB  2   IAB      Ectopic      Multiple  0   Live Births  4            Home Medications    Prior to Admission medications   Medication Sig Start Date End Date Taking? Authorizing Provider  DULoxetine (CYMBALTA) 30 MG capsule Take 30 mg by mouth daily.   Yes [provider]  albuterol  (VENTOLIN  HFA) 108 (90 Base) MCG/ACT inhaler Inhale 1-2 puffs into the lungs every 4 (four) hours as needed for wheezing or shortness of breath. 03/12/23   Van Knee, MD  amoxicillin -clavulanate (AUGMENTIN ) 875-125 MG tablet Take 1 tablet by mouth every 12 (twelve) hours. 01/16/24   Mecum, Herve Haug E, PA-C  Baclofen  5 MG TABS Take 2 tablets (10 mg total) by mouth 2 (two) times daily as needed. Patient not taking: Reported on 01/13/2024 04/02/23   Rising, Asberry, PA-C  clonazePAM (KLONOPIN) 1 MG tablet Take 1 mg by mouth 3 (three) times daily as needed. RAN OUT    [provider]  cyclobenzaprine  (FLEXERIL ) 5 MG tablet Take 5 mg by mouth 3 (three) times daily.    [provider]  diclofenac  (VOLTAREN ) 50 MG EC tablet Take 1 tablet (50 mg total) by mouth 2 (two) times daily. 11/26/23   Reddick, Johnathan B, NP  fluticasone  (FLONASE ) 50 MCG/ACT nasal spray Place 2 sprays into both nostrils daily. 03/12/23   Van Knee, MD  ondansetron  (ZOFRAN -ODT) 4 MG disintegrating tablet Take 1 tablet (4 mg total) by mouth every 8 (eight) hours as needed for nausea or vomiting. Patient not taking: Reported on 01/13/2024 10/31/23   Reddick, Johnathan B, NP  promethazine -dextromethorphan (PROMETHAZINE -DM) 6.25-15 MG/5ML syrup Take 5 mLs by mouth 4 (four) times daily as needed for cough. 01/30/24   Rolinda Rogue, MD  rosuvastatin (CRESTOR) 5 MG tablet Take 5 mg by mouth daily.    [provider]   sertraline (ZOLOFT) 100 MG tablet Take 0.5 tablets by mouth daily.    [provider]  Spacer/Aero-Holding Chambers (AEROCHAMBER MV) inhaler Use as instructed 03/12/23   Van Knee, MD  traZODone  (DESYREL ) 50 MG tablet Take 50 mg by mouth at bedtime. 12/05/23   [provider]  triamcinolone  cream (KENALOG ) 0.1 % Apply 1 Application topically 2 (two) times daily. Apply for 2 weeks. May use on face 03/12/23   Van Knee, MD    Family History Family History  Problem Relation Age of Onset   Diabetes Mother    Endometriosis Mother    Cancer Mother        lung   Diabetes  Father    Heart disease Father    Prostate cancer Maternal Grandfather    Cancer Maternal Grandfather        prostate   Colon polyps Maternal Grandmother    Cirrhosis Paternal Grandfather     Social History Social History   Tobacco Use   Smoking status: Every Day    Current packs/day: 1.00    Average packs/day: 1 pack/day for 12.0 years (12.0 ttl pk-yrs)    Types: Cigarettes   Smokeless tobacco: Never   Tobacco comments:    trying to quit  Vaping Use   Vaping status: Never Used  Substance Use Topics   Alcohol use: Not Currently    Comment: occasionally   Drug use: Not Currently    Types: Marijuana, Cocaine    Comment: cocaine she states is rec, she states she stopped marijuana 4 months ago     Allergies   Banana, Latex, Bioflavonoids, Citrus, Hydrocodone , and Miconazole   Review of Systems Review of Systems  Constitutional: Negative.   HENT:  Positive for congestion and rhinorrhea.   Respiratory:  Positive for cough and shortness of breath.   Cardiovascular: Negative.   Gastrointestinal:  Positive for hematochezia.  Genitourinary: Negative.   Musculoskeletal: Negative.      Physical Exam Triage Vital Signs ED Triage Vitals  Encounter Vitals Group     BP 02/13/24 1139 119/83     Girls Systolic BP Percentile --      Girls Diastolic BP Percentile --      Boys  Systolic BP Percentile --      Boys Diastolic BP Percentile --      Pulse Rate 02/13/24 1139 74     Resp 02/13/24 1139 (!) 24     Temp 02/13/24 1139 98 F (36.7 C)     Temp Source 02/13/24 1139 Oral     SpO2 02/13/24 1139 (!) 7 %     Weight --      Height --      Head Circumference --      Peak Flow --      Pain Score 02/13/24 1134 7     Pain Loc --      Pain Education --      Exclude from Growth Chart --    No data found.  Updated Vital Signs BP 119/83 (BP Location: Right Arm) Comment (BP Location): large cuff  Pulse 74   Temp 98 F (36.7 C) (Oral)   Resp (!) 24   LMP 01/20/2024 (Approximate)   SpO2 (!) 7%   Visual Acuity Right Eye Distance:   Left Eye Distance:   Bilateral Distance:    Right Eye Near:   Left Eye Near:    Bilateral Near:     Physical Exam Constitutional:      General: She is not in acute distress.    Appearance: Normal appearance. She is normal weight. She is not ill-appearing or toxic-appearing.  HENT:     Nose: Congestion present.     Mouth/Throat:     Mouth: Mucous membranes are moist.  Cardiovascular:     Rate and Rhythm: Normal rate and regular rhythm.  Pulmonary:     Effort: Pulmonary effort is normal.     Breath sounds: Wheezing present.  Musculoskeletal:     Cervical back: Normal range of motion and neck supple.  Skin:    General: Skin is warm.  Neurological:     General: No focal deficit present.  Mental Status: She is alert.  Psychiatric:        Mood and Affect: Mood normal.      UC Treatments / Results  Labs (all labs ordered are listed, but only abnormal results are displayed) Labs Reviewed - No data to display  EKG   Radiology No results found.  Procedures Procedures (including critical care time)  Medications Ordered in UC Medications - No data to display  Initial Impression / Assessment and Plan / UC Course  I have reviewed the triage vital signs and the nursing notes.  Pertinent labs & imaging  results that were available during my care of the patient were reviewed by me and considered in my medical decision making (see chart for details).   Final Clinical Impressions(s) / UC Diagnoses   Final diagnoses:  Acute cough  Acute bronchitis, unspecified organism  Rectal bleeding     Discharge Instructions      You were seen today for worsening/continued cough.  Your xray appears normal today.  If the radiologist reads this differently we will notify you.  Your symptoms appear consistent with a virus.  I have sent out a refill of the cough syrup, and an oral steroid x 5 days.   Return if not improving.   For the rectal bleeding, you may wish to make an appointment with a gastroenterologist for further evaluation.  Go to the ER if you have profuse rectal bleeding.     ED Prescriptions     Medication Sig Dispense Auth. Provider   promethazine -dextromethorphan (PROMETHAZINE -DM) 6.25-15 MG/5ML syrup Take 5 mLs by mouth 4 (four) times daily as needed for cough. 118 mL Loye Reininger, MD   predniSONE  (DELTASONE ) 20 MG tablet Take 2 tablets (40 mg total) by mouth daily for 5 days. 10 tablet Darral Longs, MD      PDMP not reviewed this encounter.   Darral Longs, MD 02/13/24 1254

## 2024-02-13 NOTE — Discharge Instructions (Addendum)
 You were seen today for worsening/continued cough.  Your xray appears normal today.  If the radiologist reads this differently we will notify you.  Your symptoms appear consistent with a virus.  I have sent out a refill of the cough syrup, and an oral steroid x 5 days.   Return if not improving.   For the rectal bleeding, you may wish to make an appointment with a gastroenterologist for further evaluation.  Go to the ER if you have profuse rectal bleeding.

## 2024-02-13 NOTE — ED Triage Notes (Addendum)
 Patient reports cough is worsening .  Patient states she urinates on self with cough.  Slight sinus congestion, better than it was.    Patient is taking cough medicine and routine allergy meds  Patient has also mentioned intermittent rectal bleeding for a year that she noticed with hard stool.  Concerned that bleeding is heavier today

## 2024-04-21 ENCOUNTER — Ambulatory Visit

## 2024-04-30 ENCOUNTER — Encounter (HOSPITAL_BASED_OUTPATIENT_CLINIC_OR_DEPARTMENT_OTHER): Payer: Self-pay | Admitting: Pulmonary Disease

## 2024-04-30 DIAGNOSIS — R0681 Apnea, not elsewhere classified: Secondary | ICD-10-CM

## 2024-04-30 DIAGNOSIS — R0683 Snoring: Secondary | ICD-10-CM

## 2024-04-30 DIAGNOSIS — G4710 Hypersomnia, unspecified: Secondary | ICD-10-CM

## 2024-05-12 ENCOUNTER — Ambulatory Visit

## 2024-06-25 ENCOUNTER — Ambulatory Visit: Admitting: Adult Health
